# Patient Record
Sex: Female | Born: 1935
Health system: Southern US, Community
[De-identification: ages and names within clinical notes are randomized; demographics above are authoritative.]

## PROBLEM LIST (undated history)

## (undated) DIAGNOSIS — R0902 Hypoxemia: Secondary | ICD-10-CM

## (undated) DIAGNOSIS — M546 Pain in thoracic spine: Secondary | ICD-10-CM

## (undated) DIAGNOSIS — E739 Lactose intolerance, unspecified: Secondary | ICD-10-CM

## (undated) DIAGNOSIS — Z87898 Personal history of other specified conditions: Secondary | ICD-10-CM

## (undated) DIAGNOSIS — R300 Dysuria: Secondary | ICD-10-CM

## (undated) DIAGNOSIS — Z8619 Personal history of other infectious and parasitic diseases: Secondary | ICD-10-CM

## (undated) DIAGNOSIS — J189 Pneumonia, unspecified organism: Secondary | ICD-10-CM

## (undated) DIAGNOSIS — R062 Wheezing: Secondary | ICD-10-CM

## (undated) DIAGNOSIS — K219 Gastro-esophageal reflux disease without esophagitis: Secondary | ICD-10-CM

## (undated) DIAGNOSIS — R0789 Other chest pain: Secondary | ICD-10-CM

## (undated) DIAGNOSIS — R9389 Abnormal findings on diagnostic imaging of other specified body structures: Secondary | ICD-10-CM

## (undated) DIAGNOSIS — R5383 Other fatigue: Secondary | ICD-10-CM

## (undated) DIAGNOSIS — R0600 Dyspnea, unspecified: Secondary | ICD-10-CM

## (undated) DIAGNOSIS — J019 Acute sinusitis, unspecified: Secondary | ICD-10-CM

## (undated) DIAGNOSIS — K5730 Diverticulosis of large intestine without perforation or abscess without bleeding: Secondary | ICD-10-CM

## (undated) DIAGNOSIS — E669 Obesity, unspecified: Secondary | ICD-10-CM

## (undated) DIAGNOSIS — D649 Anemia, unspecified: Secondary | ICD-10-CM

## (undated) DIAGNOSIS — R1030 Lower abdominal pain, unspecified: Secondary | ICD-10-CM

## (undated) DIAGNOSIS — R0683 Snoring: Secondary | ICD-10-CM

## (undated) DIAGNOSIS — N390 Urinary tract infection, site not specified: Secondary | ICD-10-CM

## (undated) DIAGNOSIS — J069 Acute upper respiratory infection, unspecified: Secondary | ICD-10-CM

## (undated) DIAGNOSIS — K120 Recurrent oral aphthae: Secondary | ICD-10-CM

## (undated) DIAGNOSIS — E785 Hyperlipidemia, unspecified: Secondary | ICD-10-CM

## (undated) DIAGNOSIS — R21 Rash and other nonspecific skin eruption: Secondary | ICD-10-CM

## (undated) DIAGNOSIS — Z86718 Personal history of other venous thrombosis and embolism: Secondary | ICD-10-CM

## (undated) DIAGNOSIS — R7302 Impaired glucose tolerance (oral): Secondary | ICD-10-CM

## (undated) DIAGNOSIS — R918 Other nonspecific abnormal finding of lung field: Secondary | ICD-10-CM

## (undated) DIAGNOSIS — I82409 Acute embolism and thrombosis of unspecified deep veins of unspecified lower extremity: Secondary | ICD-10-CM

## (undated) DIAGNOSIS — R05 Cough: Secondary | ICD-10-CM

## (undated) DIAGNOSIS — R110 Nausea: Secondary | ICD-10-CM

## (undated) DIAGNOSIS — K589 Irritable bowel syndrome without diarrhea: Secondary | ICD-10-CM

## (undated) DIAGNOSIS — M810 Age-related osteoporosis without current pathological fracture: Secondary | ICD-10-CM

## (undated) DIAGNOSIS — J029 Acute pharyngitis, unspecified: Secondary | ICD-10-CM

## (undated) DIAGNOSIS — R197 Diarrhea, unspecified: Secondary | ICD-10-CM

## (undated) DIAGNOSIS — R002 Palpitations: Secondary | ICD-10-CM

## (undated) DIAGNOSIS — K222 Esophageal obstruction: Secondary | ICD-10-CM

## (undated) DIAGNOSIS — R42 Dizziness and giddiness: Secondary | ICD-10-CM

## (undated) DIAGNOSIS — N281 Cyst of kidney, acquired: Secondary | ICD-10-CM

## (undated) DIAGNOSIS — M79609 Pain in unspecified limb: Secondary | ICD-10-CM

## (undated) DIAGNOSIS — R609 Edema, unspecified: Secondary | ICD-10-CM

## (undated) DIAGNOSIS — I10 Essential (primary) hypertension: Secondary | ICD-10-CM

## (undated) DIAGNOSIS — R911 Solitary pulmonary nodule: Secondary | ICD-10-CM

## (undated) DIAGNOSIS — J209 Acute bronchitis, unspecified: Secondary | ICD-10-CM

## (undated) HISTORY — DX: Personal history of other infectious and parasitic diseases: Z86.19

## (undated) HISTORY — DX: Other chest pain: R07.89

## (undated) HISTORY — DX: Essential (primary) hypertension: I10

## (undated) HISTORY — DX: Acute pharyngitis, unspecified: J02.9

## (undated) HISTORY — DX: Edema, unspecified: R60.9

## (undated) HISTORY — DX: Obesity, unspecified: E66.9

## (undated) HISTORY — DX: Lower abdominal pain, unspecified: R10.30

## (undated) HISTORY — DX: Esophageal obstruction: K22.2

## (undated) HISTORY — DX: Pain in thoracic spine: M54.6

## (undated) HISTORY — DX: Dyspnea, unspecified: R06.00

## (undated) HISTORY — DX: Urinary tract infection, site not specified: N39.0

## (undated) HISTORY — DX: Cyst of kidney, acquired: N28.1

## (undated) HISTORY — DX: Rash and other nonspecific skin eruption: R21

## (undated) HISTORY — DX: Irritable bowel syndrome without diarrhea: K58.9

## (undated) HISTORY — DX: Acute sinusitis, unspecified: J01.90

## (undated) HISTORY — DX: Nausea: R11.0

## (undated) HISTORY — DX: Other fatigue: R53.83

## (undated) HISTORY — DX: Anemia, unspecified: D64.9

## (undated) HISTORY — DX: Lactose intolerance, unspecified: E73.9

## (undated) HISTORY — PX: APPENDECTOMY: SHX54

## (undated) HISTORY — DX: Acute embolism and thrombosis of unspecified deep veins of unspecified lower extremity: I82.409

## (undated) HISTORY — DX: Personal history of other venous thrombosis and embolism: Z86.718

## (undated) HISTORY — PX: CHOLECYSTECTOMY: SHX55

## (undated) HISTORY — DX: Dysuria: R30.0

## (undated) HISTORY — DX: Pneumonia, unspecified organism: J18.9

## (undated) HISTORY — DX: Dizziness and giddiness: R42

## (undated) HISTORY — DX: Cough: R05

## (undated) HISTORY — DX: Age-related osteoporosis without current pathological fracture: M81.0

## (undated) HISTORY — DX: Solitary pulmonary nodule: R91.1

## (undated) HISTORY — DX: Acute bronchitis, unspecified: J20.9

## (undated) HISTORY — DX: Hyperlipidemia, unspecified: E78.5

## (undated) HISTORY — DX: Impaired glucose tolerance (oral): R73.02

## (undated) HISTORY — DX: Acute upper respiratory infection, unspecified: J06.9

## (undated) HISTORY — DX: Pain in unspecified limb: M79.609

## (undated) HISTORY — DX: Personal history of other specified conditions: Z87.898

## (undated) HISTORY — DX: Palpitations: R00.2

## (undated) HISTORY — DX: Snoring: R06.83

## (undated) HISTORY — DX: Diverticulosis of large intestine without perforation or abscess without bleeding: K57.30

## (undated) HISTORY — PX: TUBAL LIGATION: SHX77

## (undated) HISTORY — DX: Abnormal findings on diagnostic imaging of other specified body structures: R93.89

## (undated) HISTORY — DX: Hypoxemia: R09.02

## (undated) HISTORY — DX: Other nonspecific abnormal finding of lung field: R91.8

## (undated) HISTORY — DX: Wheezing: R06.2

## (undated) HISTORY — DX: Gastro-esophageal reflux disease without esophagitis: K21.9

## (undated) HISTORY — DX: Recurrent oral aphthae: K12.0

## (undated) HISTORY — DX: Diarrhea, unspecified: R19.7

---

## 1996-04-08 DIAGNOSIS — I82409 Acute embolism and thrombosis of unspecified deep veins of unspecified lower extremity: Secondary | ICD-10-CM

## 1996-04-08 HISTORY — DX: Acute embolism and thrombosis of unspecified deep veins of unspecified lower extremity: I82.409

## 1997-08-17 ENCOUNTER — Other Ambulatory Visit: Admission: RE | Admit: 1997-08-17 | Discharge: 1997-08-17 | Payer: Self-pay | Admitting: Obstetrics and Gynecology

## 1997-10-06 HISTORY — PX: HYSTEROSCOPY WITH RESECTOSCOPE: SHX5395

## 1997-10-11 ENCOUNTER — Ambulatory Visit (HOSPITAL_COMMUNITY): Admission: RE | Admit: 1997-10-11 | Discharge: 1997-10-11 | Payer: Self-pay | Admitting: Obstetrics and Gynecology

## 1998-03-09 ENCOUNTER — Other Ambulatory Visit: Admission: RE | Admit: 1998-03-09 | Discharge: 1998-03-09 | Payer: Self-pay | Admitting: Obstetrics and Gynecology

## 1998-07-06 ENCOUNTER — Ambulatory Visit (HOSPITAL_COMMUNITY): Admission: RE | Admit: 1998-07-06 | Discharge: 1998-07-06 | Payer: Self-pay | Admitting: Internal Medicine

## 1998-07-06 ENCOUNTER — Encounter: Payer: Self-pay | Admitting: Internal Medicine

## 1998-09-18 ENCOUNTER — Encounter: Payer: Self-pay | Admitting: Internal Medicine

## 1998-09-18 ENCOUNTER — Ambulatory Visit (HOSPITAL_COMMUNITY): Admission: RE | Admit: 1998-09-18 | Discharge: 1998-09-18 | Payer: Self-pay | Admitting: Internal Medicine

## 1999-01-23 ENCOUNTER — Other Ambulatory Visit: Admission: RE | Admit: 1999-01-23 | Discharge: 1999-01-23 | Payer: Self-pay | Admitting: Obstetrics and Gynecology

## 2000-02-12 ENCOUNTER — Other Ambulatory Visit: Admission: RE | Admit: 2000-02-12 | Discharge: 2000-02-12 | Payer: Self-pay | Admitting: Obstetrics and Gynecology

## 2001-03-11 ENCOUNTER — Other Ambulatory Visit: Admission: RE | Admit: 2001-03-11 | Discharge: 2001-03-11 | Payer: Self-pay | Admitting: Obstetrics and Gynecology

## 2002-03-12 ENCOUNTER — Other Ambulatory Visit: Admission: RE | Admit: 2002-03-12 | Discharge: 2002-03-12 | Payer: Self-pay | Admitting: Obstetrics and Gynecology

## 2002-07-13 ENCOUNTER — Other Ambulatory Visit: Admission: RE | Admit: 2002-07-13 | Discharge: 2002-07-13 | Payer: Self-pay | Admitting: Obstetrics and Gynecology

## 2002-07-21 ENCOUNTER — Ambulatory Visit (HOSPITAL_COMMUNITY): Admission: RE | Admit: 2002-07-21 | Discharge: 2002-07-21 | Payer: Self-pay | Admitting: Internal Medicine

## 2002-07-21 ENCOUNTER — Encounter: Payer: Self-pay | Admitting: Internal Medicine

## 2003-03-30 ENCOUNTER — Other Ambulatory Visit: Admission: RE | Admit: 2003-03-30 | Discharge: 2003-03-30 | Payer: Self-pay | Admitting: Obstetrics and Gynecology

## 2004-03-14 ENCOUNTER — Ambulatory Visit: Payer: Self-pay | Admitting: Internal Medicine

## 2004-04-03 ENCOUNTER — Other Ambulatory Visit: Admission: RE | Admit: 2004-04-03 | Discharge: 2004-04-03 | Payer: Self-pay | Admitting: Obstetrics and Gynecology

## 2004-06-04 ENCOUNTER — Ambulatory Visit: Payer: Self-pay | Admitting: Internal Medicine

## 2004-06-08 ENCOUNTER — Ambulatory Visit: Payer: Self-pay

## 2004-06-25 ENCOUNTER — Ambulatory Visit: Payer: Self-pay | Admitting: Internal Medicine

## 2004-07-12 ENCOUNTER — Ambulatory Visit: Payer: Self-pay | Admitting: Internal Medicine

## 2004-08-29 ENCOUNTER — Ambulatory Visit: Payer: Self-pay | Admitting: Internal Medicine

## 2005-02-18 ENCOUNTER — Ambulatory Visit: Payer: Self-pay | Admitting: Internal Medicine

## 2005-03-22 ENCOUNTER — Ambulatory Visit: Payer: Self-pay | Admitting: Internal Medicine

## 2005-04-05 ENCOUNTER — Ambulatory Visit: Payer: Self-pay | Admitting: Internal Medicine

## 2005-05-01 ENCOUNTER — Other Ambulatory Visit: Admission: RE | Admit: 2005-05-01 | Discharge: 2005-05-01 | Payer: Self-pay | Admitting: Obstetrics and Gynecology

## 2005-06-19 ENCOUNTER — Ambulatory Visit: Payer: Self-pay | Admitting: Internal Medicine

## 2005-10-11 ENCOUNTER — Encounter: Admission: RE | Admit: 2005-10-11 | Discharge: 2005-10-11 | Payer: Self-pay | Admitting: Sports Medicine

## 2005-12-20 ENCOUNTER — Ambulatory Visit: Payer: Self-pay | Admitting: Internal Medicine

## 2006-03-03 ENCOUNTER — Ambulatory Visit: Payer: Self-pay | Admitting: Internal Medicine

## 2006-03-03 LAB — CONVERTED CEMR LAB
ALT: 25 units/L (ref 0–40)
Chol/HDL Ratio, serum: 4.4
Cholesterol: 174 mg/dL (ref 0–200)
Crystals: NEGATIVE
Nitrite: NEGATIVE
Specific Gravity, Urine: 1.02 (ref 1.000–1.03)
Triglyceride fasting, serum: 145 mg/dL (ref 0–149)

## 2006-06-06 ENCOUNTER — Other Ambulatory Visit: Admission: RE | Admit: 2006-06-06 | Discharge: 2006-06-06 | Payer: Self-pay | Admitting: Obstetrics and Gynecology

## 2006-07-21 ENCOUNTER — Ambulatory Visit: Payer: Self-pay | Admitting: Internal Medicine

## 2006-07-21 LAB — CONVERTED CEMR LAB
AST: 26 units/L (ref 0–37)
Bilirubin, Direct: 0.1 mg/dL (ref 0.0–0.3)
CO2: 31 meq/L (ref 19–32)
Chloride: 107 meq/L (ref 96–112)
Cholesterol: 141 mg/dL (ref 0–200)
Creatinine, Ser: 0.8 mg/dL (ref 0.4–1.2)
Glucose, Bld: 108 mg/dL — ABNORMAL HIGH (ref 70–99)
HDL: 44.2 mg/dL (ref >39.0)
Potassium: 4.1 meq/L (ref 3.5–5.1)
Sodium: 145 meq/L (ref 135–145)
TSH: 0.51 microintl units/mL (ref 0.35–5.50)
Total Bilirubin: 0.8 mg/dL (ref 0.3–1.2)
Total Protein: 7.1 g/dL (ref 6.0–8.3)
Triglycerides: 85 mg/dL (ref 0–149)

## 2006-12-03 ENCOUNTER — Ambulatory Visit: Payer: Self-pay | Admitting: Internal Medicine

## 2006-12-03 LAB — CONVERTED CEMR LAB
ALT: 20 units/L (ref 0–35)
AST: 22 units/L (ref 0–37)
BUN: 17 mg/dL (ref 6–23)
Bacteria, UA: NEGATIVE
Creatinine, Ser: 0.6 mg/dL (ref 0.4–1.2)
Crystals: NEGATIVE
GFR calc Af Amer: 126 mL/min
Potassium: 4.6 meq/L (ref 3.5–5.1)
RBC / HPF: NONE SEEN
Specific Gravity, Urine: 1.015 (ref 1.000–1.03)
Total CHOL/HDL Ratio: 4.3
Urine Glucose: NEGATIVE mg/dL
Urobilinogen, UA: 0.2 (ref 0.0–1.0)

## 2007-02-02 ENCOUNTER — Ambulatory Visit: Payer: Self-pay | Admitting: Internal Medicine

## 2007-02-02 ENCOUNTER — Encounter: Payer: Self-pay | Admitting: Internal Medicine

## 2007-02-02 DIAGNOSIS — E785 Hyperlipidemia, unspecified: Secondary | ICD-10-CM

## 2007-02-02 DIAGNOSIS — K219 Gastro-esophageal reflux disease without esophagitis: Secondary | ICD-10-CM

## 2007-02-02 DIAGNOSIS — M810 Age-related osteoporosis without current pathological fracture: Secondary | ICD-10-CM

## 2007-02-02 DIAGNOSIS — Z86718 Personal history of other venous thrombosis and embolism: Secondary | ICD-10-CM | POA: Insufficient documentation

## 2007-02-02 HISTORY — DX: Gastro-esophageal reflux disease without esophagitis: K21.9

## 2007-02-02 HISTORY — DX: Personal history of other venous thrombosis and embolism: Z86.718

## 2007-02-02 HISTORY — DX: Age-related osteoporosis without current pathological fracture: M81.0

## 2007-02-02 HISTORY — DX: Hyperlipidemia, unspecified: E78.5

## 2007-02-28 ENCOUNTER — Ambulatory Visit: Payer: Self-pay | Admitting: Internal Medicine

## 2007-02-28 DIAGNOSIS — J029 Acute pharyngitis, unspecified: Secondary | ICD-10-CM | POA: Insufficient documentation

## 2007-02-28 HISTORY — DX: Acute pharyngitis, unspecified: J02.9

## 2007-03-06 ENCOUNTER — Ambulatory Visit: Payer: Self-pay | Admitting: Internal Medicine

## 2007-03-06 DIAGNOSIS — E739 Lactose intolerance, unspecified: Secondary | ICD-10-CM | POA: Insufficient documentation

## 2007-03-06 DIAGNOSIS — K222 Esophageal obstruction: Secondary | ICD-10-CM

## 2007-03-06 DIAGNOSIS — Z87898 Personal history of other specified conditions: Secondary | ICD-10-CM | POA: Insufficient documentation

## 2007-03-06 DIAGNOSIS — J019 Acute sinusitis, unspecified: Secondary | ICD-10-CM | POA: Insufficient documentation

## 2007-03-06 DIAGNOSIS — K589 Irritable bowel syndrome without diarrhea: Secondary | ICD-10-CM | POA: Insufficient documentation

## 2007-03-06 HISTORY — DX: Personal history of other specified conditions: Z87.898

## 2007-03-06 HISTORY — DX: Esophageal obstruction: K22.2

## 2007-03-06 HISTORY — DX: Lactose intolerance, unspecified: E73.9

## 2007-03-06 HISTORY — DX: Irritable bowel syndrome, unspecified: K58.9

## 2007-03-06 HISTORY — DX: Acute sinusitis, unspecified: J01.90

## 2007-03-08 DIAGNOSIS — I10 Essential (primary) hypertension: Secondary | ICD-10-CM | POA: Insufficient documentation

## 2007-03-08 DIAGNOSIS — K5730 Diverticulosis of large intestine without perforation or abscess without bleeding: Secondary | ICD-10-CM

## 2007-03-08 HISTORY — DX: Essential (primary) hypertension: I10

## 2007-03-08 HISTORY — DX: Diverticulosis of large intestine without perforation or abscess without bleeding: K57.30

## 2007-03-18 ENCOUNTER — Telehealth: Payer: Self-pay | Admitting: Internal Medicine

## 2007-03-20 ENCOUNTER — Ambulatory Visit: Payer: Self-pay | Admitting: Internal Medicine

## 2007-03-20 LAB — CONVERTED CEMR LAB
Calcium: 9 mg/dL (ref 8.4–10.5)
GFR calc Af Amer: 105 mL/min
GFR calc non Af Amer: 87 mL/min
Glucose, Bld: 101 mg/dL — ABNORMAL HIGH (ref 70–99)
Sodium: 141 meq/L (ref 135–145)

## 2007-03-30 ENCOUNTER — Telehealth: Payer: Self-pay | Admitting: Internal Medicine

## 2007-04-20 ENCOUNTER — Ambulatory Visit: Payer: Self-pay | Admitting: Internal Medicine

## 2007-04-20 DIAGNOSIS — R05 Cough: Secondary | ICD-10-CM

## 2007-04-20 DIAGNOSIS — R051 Acute cough: Secondary | ICD-10-CM | POA: Insufficient documentation

## 2007-04-20 DIAGNOSIS — R059 Cough, unspecified: Secondary | ICD-10-CM | POA: Insufficient documentation

## 2007-04-20 HISTORY — DX: Cough, unspecified: R05.9

## 2007-06-12 ENCOUNTER — Ambulatory Visit: Payer: Self-pay | Admitting: Internal Medicine

## 2007-06-12 DIAGNOSIS — J209 Acute bronchitis, unspecified: Secondary | ICD-10-CM | POA: Insufficient documentation

## 2007-06-12 DIAGNOSIS — J40 Bronchitis, not specified as acute or chronic: Secondary | ICD-10-CM | POA: Insufficient documentation

## 2007-06-12 HISTORY — DX: Acute bronchitis, unspecified: J20.9

## 2007-06-15 ENCOUNTER — Other Ambulatory Visit: Admission: RE | Admit: 2007-06-15 | Discharge: 2007-06-15 | Payer: Self-pay | Admitting: Obstetrics and Gynecology

## 2008-07-01 ENCOUNTER — Ambulatory Visit: Payer: Self-pay | Admitting: Internal Medicine

## 2008-07-01 DIAGNOSIS — R0600 Dyspnea, unspecified: Secondary | ICD-10-CM | POA: Insufficient documentation

## 2008-07-01 DIAGNOSIS — R110 Nausea: Secondary | ICD-10-CM | POA: Insufficient documentation

## 2008-07-01 HISTORY — DX: Nausea: R11.0

## 2008-07-01 HISTORY — DX: Dyspnea, unspecified: R06.00

## 2008-07-04 ENCOUNTER — Ambulatory Visit: Payer: Self-pay | Admitting: Internal Medicine

## 2008-07-05 ENCOUNTER — Telehealth (INDEPENDENT_AMBULATORY_CARE_PROVIDER_SITE_OTHER): Payer: Self-pay | Admitting: *Deleted

## 2008-07-06 ENCOUNTER — Ambulatory Visit: Payer: Self-pay

## 2008-07-06 ENCOUNTER — Encounter: Payer: Self-pay | Admitting: Cardiology

## 2008-07-06 LAB — CONVERTED CEMR LAB
AST: 22 units/L (ref 0–37)
Albumin: 3.4 g/dL — ABNORMAL LOW (ref 3.5–5.2)
Alkaline Phosphatase: 70 units/L (ref 39–117)
Basophils Absolute: 0 10*3/uL (ref 0.0–0.1)
Bilirubin Urine: NEGATIVE
Calcium: 9.1 mg/dL (ref 8.4–10.5)
Cholesterol: 193 mg/dL (ref 0–200)
Creatinine, Ser: 0.8 mg/dL (ref 0.4–1.2)
Eosinophils Absolute: 0.2 10*3/uL (ref 0.0–0.7)
HCT: 37.5 % (ref 36.0–46.0)
Hemoglobin, Urine: NEGATIVE
Ketones, ur: NEGATIVE mg/dL
Lymphocytes Relative: 33 % (ref 12.0–46.0)
Lymphs Abs: 1.4 10*3/uL (ref 0.7–4.0)
Monocytes Relative: 9.5 % (ref 3.0–12.0)
Platelets: 170 10*3/uL (ref 150.0–400.0)
RDW: 14.5 % (ref 11.5–14.6)
TSH: 1.14 microintl units/mL (ref 0.35–5.50)
Total Protein, Urine: NEGATIVE mg/dL
Total Protein: 6.6 g/dL (ref 6.0–8.3)
Triglycerides: 123 mg/dL (ref 0.0–149.0)
Urobilinogen, UA: 0.2 (ref 0.0–1.0)

## 2008-07-13 ENCOUNTER — Telehealth: Payer: Self-pay | Admitting: Internal Medicine

## 2008-07-14 ENCOUNTER — Telehealth: Payer: Self-pay | Admitting: Internal Medicine

## 2008-07-19 ENCOUNTER — Ambulatory Visit: Payer: Self-pay | Admitting: Internal Medicine

## 2008-07-19 DIAGNOSIS — N390 Urinary tract infection, site not specified: Secondary | ICD-10-CM | POA: Insufficient documentation

## 2008-07-19 HISTORY — DX: Urinary tract infection, site not specified: N39.0

## 2008-07-26 ENCOUNTER — Other Ambulatory Visit: Admission: RE | Admit: 2008-07-26 | Discharge: 2008-07-26 | Payer: Self-pay | Admitting: Obstetrics & Gynecology

## 2009-01-13 ENCOUNTER — Ambulatory Visit: Payer: Self-pay | Admitting: Internal Medicine

## 2009-01-20 LAB — CONVERTED CEMR LAB
Albumin: 3.7 g/dL (ref 3.5–5.2)
CO2: 24 meq/L (ref 19–32)
Calcium: 9.2 mg/dL (ref 8.4–10.5)
Creatinine, Ser: 0.9 mg/dL (ref 0.4–1.2)
Glucose, Bld: 89 mg/dL (ref 70–99)
Hemoglobin, Urine: NEGATIVE
Nitrite: NEGATIVE
TSH: 1.21 microintl units/mL (ref 0.35–5.50)
Total Protein: 6.6 g/dL (ref 6.0–8.3)
Urobilinogen, UA: 0.2 (ref 0.0–1.0)

## 2009-01-26 ENCOUNTER — Ambulatory Visit: Payer: Self-pay | Admitting: Internal Medicine

## 2009-03-29 ENCOUNTER — Telehealth: Payer: Self-pay | Admitting: Internal Medicine

## 2009-06-23 ENCOUNTER — Encounter (INDEPENDENT_AMBULATORY_CARE_PROVIDER_SITE_OTHER): Payer: Self-pay | Admitting: *Deleted

## 2009-07-11 ENCOUNTER — Ambulatory Visit: Payer: Self-pay | Admitting: Internal Medicine

## 2009-07-11 LAB — CONVERTED CEMR LAB
Albumin: 3.7 g/dL (ref 3.5–5.2)
BUN: 14 mg/dL (ref 6–23)
Bilirubin Urine: NEGATIVE
Bilirubin, Direct: 0.1 mg/dL (ref 0.0–0.3)
Chloride: 103 meq/L (ref 96–112)
Cholesterol: 175 mg/dL (ref 0–200)
Hemoglobin, Urine: NEGATIVE
Hgb A1c MFr Bld: 5.9 % (ref 4.6–6.5)
LDL Cholesterol: 103 mg/dL — ABNORMAL HIGH (ref 0–99)
Nitrite: NEGATIVE
Potassium: 4.1 meq/L (ref 3.5–5.1)
Total Protein: 7.2 g/dL (ref 6.0–8.3)
VLDL: 22 mg/dL (ref 0.0–40.0)
pH: 6.5 (ref 5.0–8.0)

## 2009-07-12 ENCOUNTER — Telehealth: Payer: Self-pay | Admitting: Internal Medicine

## 2009-07-17 ENCOUNTER — Telehealth: Payer: Self-pay | Admitting: Internal Medicine

## 2009-07-25 ENCOUNTER — Ambulatory Visit: Payer: Self-pay | Admitting: Internal Medicine

## 2009-07-25 DIAGNOSIS — M79609 Pain in unspecified limb: Secondary | ICD-10-CM

## 2009-07-25 HISTORY — DX: Pain in unspecified limb: M79.609

## 2009-08-01 ENCOUNTER — Inpatient Hospital Stay (HOSPITAL_COMMUNITY): Admission: EM | Admit: 2009-08-01 | Discharge: 2009-08-04 | Payer: Self-pay | Admitting: Emergency Medicine

## 2009-08-01 ENCOUNTER — Ambulatory Visit: Payer: Self-pay | Admitting: Cardiovascular Disease

## 2009-08-01 ENCOUNTER — Ambulatory Visit: Payer: Self-pay | Admitting: Internal Medicine

## 2009-08-01 DIAGNOSIS — R0902 Hypoxemia: Secondary | ICD-10-CM | POA: Insufficient documentation

## 2009-08-01 HISTORY — DX: Hypoxemia: R09.02

## 2009-08-02 ENCOUNTER — Encounter (INDEPENDENT_AMBULATORY_CARE_PROVIDER_SITE_OTHER): Payer: Self-pay | Admitting: Internal Medicine

## 2009-08-07 ENCOUNTER — Telehealth: Payer: Self-pay | Admitting: Internal Medicine

## 2009-08-10 ENCOUNTER — Ambulatory Visit: Payer: Self-pay | Admitting: Internal Medicine

## 2009-08-10 DIAGNOSIS — R21 Rash and other nonspecific skin eruption: Secondary | ICD-10-CM | POA: Insufficient documentation

## 2009-08-10 DIAGNOSIS — D649 Anemia, unspecified: Secondary | ICD-10-CM | POA: Insufficient documentation

## 2009-08-10 HISTORY — DX: Rash and other nonspecific skin eruption: R21

## 2009-08-10 HISTORY — DX: Anemia, unspecified: D64.9

## 2009-08-15 DIAGNOSIS — J189 Pneumonia, unspecified organism: Secondary | ICD-10-CM | POA: Insufficient documentation

## 2009-08-15 HISTORY — DX: Pneumonia, unspecified organism: J18.9

## 2009-09-12 ENCOUNTER — Ambulatory Visit: Payer: Self-pay | Admitting: Internal Medicine

## 2009-09-12 LAB — CONVERTED CEMR LAB
BUN: 18 mg/dL (ref 6–23)
CO2: 30 meq/L (ref 19–32)
Calcium: 9.4 mg/dL (ref 8.4–10.5)
Creatinine, Ser: 0.8 mg/dL (ref 0.4–1.2)
Glucose, Bld: 101 mg/dL — ABNORMAL HIGH (ref 70–99)
Ketones, ur: NEGATIVE mg/dL
Specific Gravity, Urine: >=1.03 (ref 1.000–1.030)
Urine Glucose: NEGATIVE mg/dL
pH: 5.5 (ref 5.0–8.0)

## 2009-09-26 ENCOUNTER — Emergency Department (HOSPITAL_COMMUNITY): Admission: EM | Admit: 2009-09-26 | Discharge: 2009-09-26 | Payer: Self-pay | Admitting: Emergency Medicine

## 2009-09-27 ENCOUNTER — Ambulatory Visit: Payer: Self-pay | Admitting: Cardiovascular Disease

## 2009-09-27 DIAGNOSIS — R55 Syncope and collapse: Secondary | ICD-10-CM | POA: Insufficient documentation

## 2009-09-27 DIAGNOSIS — R609 Edema, unspecified: Secondary | ICD-10-CM | POA: Insufficient documentation

## 2009-09-27 HISTORY — DX: Edema, unspecified: R60.9

## 2009-09-29 ENCOUNTER — Ambulatory Visit: Payer: Self-pay | Admitting: Internal Medicine

## 2009-10-27 ENCOUNTER — Encounter: Payer: Self-pay | Admitting: Cardiovascular Disease

## 2009-10-27 ENCOUNTER — Ambulatory Visit (HOSPITAL_COMMUNITY): Admission: RE | Admit: 2009-10-27 | Discharge: 2009-10-27 | Payer: Self-pay | Admitting: Cardiovascular Disease

## 2009-10-27 ENCOUNTER — Ambulatory Visit: Payer: Self-pay | Admitting: Cardiology

## 2009-10-27 ENCOUNTER — Ambulatory Visit: Payer: Self-pay

## 2010-02-06 ENCOUNTER — Ambulatory Visit: Payer: Self-pay | Admitting: Internal Medicine

## 2010-02-06 LAB — CONVERTED CEMR LAB
CO2: 29 meq/L (ref 19–32)
Chloride: 108 meq/L (ref 96–112)
Creatinine, Ser: 0.7 mg/dL (ref 0.4–1.2)
Potassium: 4.3 meq/L (ref 3.5–5.1)
Sodium: 143 meq/L (ref 135–145)

## 2010-02-15 ENCOUNTER — Ambulatory Visit: Payer: Self-pay | Admitting: Internal Medicine

## 2010-02-26 ENCOUNTER — Telehealth: Payer: Self-pay | Admitting: Internal Medicine

## 2010-04-08 DIAGNOSIS — R918 Other nonspecific abnormal finding of lung field: Secondary | ICD-10-CM

## 2010-04-08 HISTORY — DX: Other nonspecific abnormal finding of lung field: R91.8

## 2010-04-10 ENCOUNTER — Ambulatory Visit: Admit: 2010-04-10 | Payer: Self-pay | Admitting: Internal Medicine

## 2010-05-04 ENCOUNTER — Telehealth: Payer: Self-pay | Admitting: Internal Medicine

## 2010-05-04 ENCOUNTER — Telehealth (INDEPENDENT_AMBULATORY_CARE_PROVIDER_SITE_OTHER): Payer: Self-pay | Admitting: *Deleted

## 2010-05-04 ENCOUNTER — Other Ambulatory Visit: Payer: Self-pay | Admitting: Internal Medicine

## 2010-05-04 ENCOUNTER — Ambulatory Visit
Admission: RE | Admit: 2010-05-04 | Discharge: 2010-05-04 | Payer: Self-pay | Source: Home / Self Care | Attending: Internal Medicine | Admitting: Internal Medicine

## 2010-05-04 LAB — URINALYSIS
Bilirubin Urine: NEGATIVE
Hemoglobin, Urine: NEGATIVE
Ketones, ur: NEGATIVE
Total Protein, Urine: NEGATIVE
Urine Glucose: NEGATIVE
Urobilinogen, UA: 0.2 (ref 0.0–1.0)

## 2010-05-04 LAB — URINALYSIS, ROUTINE W REFLEX MICROSCOPIC
Bilirubin Urine: NEGATIVE
Hemoglobin, Urine: NEGATIVE
Ketones, ur: NEGATIVE
Nitrite: NEGATIVE
Total Protein, Urine: NEGATIVE
Urine Glucose: NEGATIVE
pH: 5 (ref 5.0–8.0)

## 2010-05-08 NOTE — Assessment & Plan Note (Signed)
Summary: 4 mos f/u #/cd   Vital Signs:  Patient profile:   77 year old female Height:      59 inches Weight:      162 pounds BMI:     32.84 O2 Sat:      99 % on Room air Temp:     97.7 degrees F oral Pulse rate:   73 / minute BP sitting:   104 / 70  (left arm) Cuff size:   regular  Vitals Entered By: Alysia Penna (February 15, 2010 1:18 PM)  O2 Flow:  Room air CC: pt here for follow up. /cp sma   Primary Care Provider:  Tresa Garter MD  CC:  pt here for follow up. /cp sma.  History of Present Illness: The patient presents for a follow up of hypertension, syncope, hyperlipidemia   Current Medications (verified): 1)  Centrum Silver   Tabs (Multiple Vitamins-Minerals) .... Once Daily 2)  Adult Aspirin Low Strength 81 Mg  Tbdp (Aspirin) .... Once Daily 3)  Omega-3 1000 Mg  Caps (Omega-3 Fatty Acids) .... Once Daily 4)  Calcium-Vitamin D 500-200 Mg-Unit  Tabs (Calcium Carbonate-Vitamin D) .... Once Daily 5)  Lasix 40 Mg  Tabs (Furosemide) .Marland Kitchen.. 1 Po As Needed 6)  Klor-Con M20 20 Meq Cr-Tabs (Potassium Chloride Crys Cr) .Marland Kitchen.. 1 Once Daily 7)  Simvastatin 10 Mg Tabs (Simvastatin) .Marland Kitchen.. 1 Po Qd 8)  Benicar 40 Mg  Tabs (Olmesartan Medoxomil) .Marland Kitchen.. 1 By Mouth Once Daily 9)  Naproxen 500 Mg Tabs (Naproxen) .Marland Kitchen.. 1 By Mouth Two Times A Day Pc For Pain/arthritis  Allergies (verified): 1)  ! Penicillin 2)  ! Rocephin 3)  Lipitor (Atorvastatin Calcium) 4)  * Fosamax 5)  Avelox 6)  Codeine Phosphate (Codeine Phosphate)  Past History:  Past Medical History: Last updated: 09/27/2009 Hypertension Hyperlipidemia 272.0 DVT, hx of RIGHT  98 GERD Osteoporosis  733.00 ESOPHAGITIS  06 esophageal stricture PALPITATIONS 785.1 LEFT KIDNEY CYST  DR. Aldean Ast Diverticulosis, colon IBS Osteoporosis Hx of shingles glucose intolerance Pneumonia, hx of 2011  Social History: Last updated: 09/27/2009  She rarely drinks wine.  She has never used tobacco or   drugs.  She is  active.  Lives independently. Widowed     Review of Systems       lost wt on diet  Physical Exam  General:  alert, pleasant, looks less tired Nose:  External nasal examination shows no deformity or inflammation. Nasal mucosa are pink and moist without lesions or exudates. Mouth:  Less erythematous throat mucosa and intranasal erythema.  Neck:  No deformities, masses, or tenderness noted. Heart:  RRR Abdomen:  S/NT Msk:  No deformity or scoliosis noted of thoracic or lumbar spine.  Good ROM, balance Calves NT Extremities:  no edema lower extremities bilaterally Neurologic:  No cranial nerve deficits noted. Station and gait are normal. Plantar reflexes are down-going bilaterally. DTRs are symmetrical throughout. Sensory, motor and coordinative functions appear intact. Skin:  Intact without suspicious lesions or rashes Psych:  Cognition and judgment appear intact. Alert and cooperative with normal attention span and concentration. No apparent delusions, illusions, hallucinations   Impression & Recommendations:  Problem # 1:  HYPERTENSION (ICD-401.9) Assessment Improved  Her updated medication list for this problem includes:    Lasix 40 Mg Tabs (Furosemide) .Marland Kitchen... 1 po as needed    Benicar 40 Mg Tabs (Olmesartan medoxomil) .Marland Kitchen... 1 by mouth once daily  Problem # 2:  HYPERLIPIDEMIA (ICD-272.4) Assessment: Unchanged  Her updated medication  list for this problem includes:    Simvastatin 10 Mg Tabs (Simvastatin) .Marland Kitchen... 1 po qd  Problem # 3:  GERD (ICD-530.81) Assessment: Improved  Problem # 4:  EDEMA (ICD-782.3) Assessment: Improved  Her updated medication list for this problem includes:    Lasix 40 Mg Tabs (Furosemide) .Marland Kitchen... 1 po as needed  Problem # 5:  SYNCOPE (ICD-780.2) resolved Assessment: Improved The labs were reviewed with the patient.   Complete Medication List: 1)  Centrum Silver Tabs (Multiple vitamins-minerals) .... Once daily 2)  Adult Aspirin Low Strength 81  Mg Tbdp (Aspirin) .... Once daily 3)  Omega-3 1000 Mg Caps (Omega-3 fatty acids) .... Once daily 4)  Calcium-vitamin D 500-200 Mg-unit Tabs (Calcium carbonate-vitamin d) .... Once daily 5)  Lasix 40 Mg Tabs (Furosemide) .Marland Kitchen.. 1 po as needed 6)  Klor-con M20 20 Meq Cr-tabs (Potassium chloride crys cr) .Marland Kitchen.. 1 once daily 7)  Simvastatin 10 Mg Tabs (Simvastatin) .Marland Kitchen.. 1 po qd 8)  Benicar 40 Mg Tabs (Olmesartan medoxomil) .Marland Kitchen.. 1 by mouth once daily 9)  Naproxen 500 Mg Tabs (Naproxen) .Marland Kitchen.. 1 by mouth two times a day pc for pain/arthritis  Patient Instructions: 1)  Please schedule a follow-up appointment in 6 months well w/labs. Prescriptions: BENICAR 40 MG  TABS (OLMESARTAN MEDOXOMIL) 1 by mouth once daily  #90 Tablet x 3   Entered and Authorized by:   Tresa Garter MD   Signed by:   Tresa Garter MD on 02/15/2010   Method used:   Print then Give to Patient   RxID:   [id number] SIMVASTATIN 10 MG TABS (SIMVASTATIN) 1 po qd  #90 x 3   Entered and Authorized by:   Tresa Garter MD   Signed by:   Tresa Garter MD on 02/15/2010   Method used:   Print then Give to Patient   RxID:   [id number] NAPROXEN 500 MG TABS (NAPROXEN) 1 by mouth two times a day pc for pain/arthritis  #60 x 3   Entered and Authorized by:   Tresa Garter MD   Signed by:   Tresa Garter MD on 02/15/2010   Method used:   Electronically to        [facility] (retail)       [address] [place], [place]  [address]       Ph: [phone]       Fax: [phone]   RxID:   [id number]    Orders Added: 1)  Est. Patient Level IV [[address]]   Immunization History:  Influenza Immunization History:    Influenza:  historical (01/17/2010)   Immunization History:  Influenza Immunization History:    Influenza:  Historical (01/17/2010)

## 2010-05-08 NOTE — Progress Notes (Signed)
Summary: results  Phone Note Call from Patient Call back at Home Phone [phone]   Summary of Call: Patient called stating that she will be going out of town and needs to know if she indeed has a uti. Patient would like to fill prescription before she leaves. Please advise. Initial call taken by: Lucious Groves,  July 12, 2009 10:00 AM  Follow-up for Phone Call        UA was nl. Any symptoms? Follow-up by: Tresa Garter MD,  July 12, 2009 1:14 PM  Additional Follow-up for Phone Call Additional follow up Details #1::        Patient notified, and c/o frequency but denies burning/cramping. Pt states that she will wait for her appt on the 19th. Additional Follow-up by: Lucious Groves,  July 12, 2009 2:54 PM    Additional Follow-up for Phone Call Additional follow up Details #2::    I can give her Cipro Rx now to take w/her if symptoms get worse Follow-up by: Tresa Garter MD,  July 12, 2009 5:07 PM  Additional Follow-up for Phone Call Additional follow up Details #3:: Details for Additional Follow-up Action Taken: Patient declined prescription. Does not want to take unless tests confirms infection. Additional Follow-up by: Lucious Groves,  July 13, 2009 4:56 PM  Prescriptions: CIPRO 250 MG TABS (CIPROFLOXACIN HCL) 1 by mouth x 5 days  #10 x 0   Entered by:   Lucious Groves   Authorized by:   Tresa Garter MD   Signed by:   Lucious Groves on 07/13/2009   Method used:   Electronically to        [facility] (retail)       [address] [place], [place]  [address]       Ph: [phone]       Fax: [phone]   RxID:   [id number]

## 2010-05-08 NOTE — Assessment & Plan Note (Signed)
Summary: 4 MO ROV /NWS #   Vital Signs:  Patient profile:   77 year old female Height:      59 inches Weight:      168.25 pounds BMI:     34.11 O2 Sat:      95 % on Room air Temp:     97.6 degrees F oral Pulse rate:   86 / minute BP sitting:   118 / 80  (left arm) Cuff size:   regular  Vitals Entered By: Lucious Groves (July 25, 2009 9:31 AM)  O2 Flow:  Room air CC: 4 mo rtn ov./kb Is Patient Diabetic? No Pain Assessment Patient in pain? no        Primary Care Provider:  Georgina Quint Marcy Bogosian MD  CC:  4 mo rtn ov./kb.  History of Present Illness: F/u HTN, elev. glu C/o cough x 1 wk C/o new foot pain  Current Medications (verified): 1)  Centrum Silver   Tabs (Multiple Vitamins-Minerals) .... Once Daily 2)  Adult Aspirin Low Strength 81 Mg  Tbdp (Aspirin) .... Once Daily 3)  Omega-3 1000 Mg  Caps (Omega-3 Fatty Acids) .... Once Daily 4)  Calcium-Vitamin D 500-200 Mg-Unit  Tabs (Calcium Carbonate-Vitamin D) .... Once Daily 5)  Lasix 40 Mg  Tabs (Furosemide) .Marland Kitchen.. 1 Po As Needed 6)  Klor-Con M20 20 Meq Cr-Tabs (Potassium Chloride Crys Cr) .Marland Kitchen.. 1 Once Daily 7)  Simvastatin 10 Mg Tabs (Simvastatin) .Marland Kitchen.. 1 Po Qd 8)  Benicar 40 Mg  Tabs (Olmesartan Medoxomil) .Marland Kitchen.. 1 By Mouth Once Daily  Allergies (verified): 1)  ! Penicillin 2)  Lipitor (Atorvastatin Calcium) 3)  Codeine Phosphate (Codeine Phosphate) 4)  * Fosamax  Past History:  Past Medical History: Last updated: 03/06/2007 DVT, hx of RIGHT  98 GERD Hyperlipidemia 272.0 Osteoporosis  733.00 ESOPHAGITIS  06 esophageal stricture PALPITATIONS 785.1 LEFT KIDNEY CYST  DR. Aldean Ast Hypertension Diverticulosis, colon IBS Osteoporosis Hx of shingles glucose intolerance  Social History: Last updated: 03/06/2007 Never Smoked Alcohol use-yes  Review of Systems       The patient complains of weight gain and prolonged cough.  The patient denies fever, chest pain, dyspnea on exertion, and abdominal pain.     Physical Exam  General:  alert, pleasant, healthy appering adult Nose:  External nasal examination shows no deformity or inflammation. Nasal mucosa are pink and moist without lesions or exudates. Mouth:  Oral mucosa and oropharynx without lesions or exudates.  Teeth in good repair. Lungs:  clear bilaterally, no wheezes, ronchi or crackles Heart:  RRR, mild systolic murmur best heard at the pulmonic post, no rubs, gallops or thrills Abdomen:  soft and non-tender with normal BS, no organomagalies, no masses Msk:  No deformity or scoliosis noted of thoracic or lumbar spine.  Great ROM, balance R forefoot is tender Neurologic:  No cranial nerve deficits noted. Station and gait are normal. Plantar reflexes are down-going bilaterally. DTRs are symmetrical throughout. Sensory, motor and coordinative functions appear intact. Skin:  Intact without suspicious lesions or rashes Psych:  Cognition and judgment appear intact. Alert and cooperative with normal attention span and concentration. No apparent delusions, illusions, hallucinations   Impression & Recommendations:  Problem # 1:  COUGH (ICD-786.2) asthmatic Assessment New Loratidine Proair  Problem # 2:  FOOT PAIN (ICD-729.5) R Assessment: New Had x rays Dr Farris Has Pennsaid  Problem # 3:  GLUCOSE INTOLERANCE (ICD-271.3) Assessment: Unchanged The labs were reviewed with the patient.   Problem # 4:  UTI (ICD-599.0) Assessment:  New Cipro prn  Problem # 5:  BRONCHITIS, ACUTE (ICD-466.0)  Her updated medication list for this problem includes:    Proair Hfa 108 (90 Base) Mcg/act Aers (Albuterol sulfate) .Marland Kitchen... 2 inh qid as needed    Zithromax Z-pak 250 Mg Tabs (Azithromycin) .Marland Kitchen... As dirrected if green d/c  Problem # 6:  HYPERTENSION (ICD-401.9) Assessment: Comment Only  Her updated medication list for this problem includes:    Lasix 40 Mg Tabs (Furosemide) .Marland Kitchen... 1 po as needed    Benicar 40 Mg Tabs (Olmesartan medoxomil) .Marland Kitchen... 1  by mouth once daily  Complete Medication List: 1)  Centrum Silver Tabs (Multiple vitamins-minerals) .... Once daily 2)  Adult Aspirin Low Strength 81 Mg Tbdp (Aspirin) .... Once daily 3)  Omega-3 1000 Mg Caps (Omega-3 fatty acids) .... Once daily 4)  Calcium-vitamin D 500-200 Mg-unit Tabs (Calcium carbonate-vitamin d) .... Once daily 5)  Lasix 40 Mg Tabs (Furosemide) .Marland Kitchen.. 1 po as needed 6)  Klor-con M20 20 Meq Cr-tabs (Potassium chloride crys cr) .Marland Kitchen.. 1 once daily 7)  Simvastatin 10 Mg Tabs (Simvastatin) .Marland Kitchen.. 1 po qd 8)  Benicar 40 Mg Tabs (Olmesartan medoxomil) .Marland Kitchen.. 1 by mouth once daily 9)  Loratadine 10 Mg Tabs (Loratadine) .Marland Kitchen.. 1 by mouth once daily as needed allergies 10)  Proair Hfa 108 (90 Base) Mcg/act Aers (Albuterol sulfate) .... 2 inh qid as needed 11)  Zithromax Z-pak 250 Mg Tabs (Azithromycin) .... As dirrected 12)  Pennsaid 1.5 % Soln (Diclofenac sodium) .... 3-5 gtt on skin three times a day for pain  Patient Instructions: 1)  Please schedule a follow-up appointment in 4 months. 2)  BMP prior to visit, ICD-9: 3)  HbgA1C prior to visit, ICD-9:790.29 Prescriptions: PENNSAID 1.5 % SOLN (DICLOFENAC SODIUM) 3-5 gtt on skin three times a day for pain  #1 x 3   Entered and Authorized by:   Tresa Garter MD   Signed by:   Tresa Garter MD on 07/25/2009   Method used:   Print then Give to Patient   RxID:   [id number] SIMVASTATIN 10 MG TABS (SIMVASTATIN) 1 po qd  #90 x 3   Entered and Authorized by:   Tresa Garter MD   Signed by:   Tresa Garter MD on 07/25/2009   Method used:   Electronically to        [facility] (retail)       [address] [place], [place]  [address]       Ph: [phone]       Fax: [phone]   RxID:   [id number] BENICAR 40 MG  TABS (OLMESARTAN MEDOXOMIL) 1 by mouth once daily  #90 Tablet x 3   Entered and Authorized by:   Tresa Garter MD   Signed by:   Tresa Garter MD on 07/25/2009   Method used:   Electronically to        [facility] (retail)       [address] [place], [place]  [address]       Ph: [phone]       Fax: [phone]   RxID:   [id number] ZITHROMAX Z-PAK 250 MG TABS (AZITHROMYCIN) as dirrected  #1 x 0   Entered and Authorized by:   Georgina Quint Karlynn Furrow  MD   Signed by:   Tresa Garter MD on 07/25/2009   Method used:   Electronically to        [facility] (retail)       [address] [place], [place]  [address]       Ph: [phone]       Fax: [phone]   RxID:   [id number] PROAIR HFA 108 (90 BASE) MCG/ACT AERS (ALBUTEROL SULFATE) 2 inh qid as needed  #3 x 3   Entered and Authorized by:   Tresa Garter MD   Signed by:   Tresa Garter MD on 07/25/2009   Method used:   Electronically to        [facility] (retail)       [address] [place], [place]  [address]       Ph: [phone]       Fax: [phone]   RxID:   [id number] LORATADINE 10 MG TABS (LORATADINE) 1 by mouth once daily as needed allergies  #30 x 6   Entered and Authorized by:   Tresa Garter MD   Signed by:   Tresa Garter MD on 07/25/2009   Method used:   Electronically to        [facility] (retail)       [address] [place], [place]  [address]       Ph: [phone]       Fax: [phone]   RxID:   [id number]

## 2010-05-08 NOTE — Assessment & Plan Note (Signed)
Summary: 6 wk f/u // #/ cd   Vital Signs:  Patient profile:   77 year old female Weight:      170 pounds BMI:     34.46 O2 Sat:      96 % on Room air Temp:     97.9 degrees F oral Pulse rate:   98 / minute Resp:     16 per minute BP sitting:   100 / 60  (left arm)  Vitals Entered By: Lanier Prude, CMA(AAMA) (September 29, 2009 9:48 AM)  O2 Flow:  Room air CC: 6 wk f/u. Comments pt states she fainted Tues and is being followd by Dr. Eden Emms next month for an Echocardiogram.   Primary Care Provider:  Tresa Garter MD  CC:  6 wk f/u.Marland Kitchen  History of Present Illness: The patient presents for a post-ER visit for syncope. She went to see Dr Eden Emms F/u HTN, edema   Current Medications (verified): 1)  Centrum Silver   Tabs (Multiple Vitamins-Minerals) .... Once Daily 2)  Adult Aspirin Low Strength 81 Mg  Tbdp (Aspirin) .... Once Daily 3)  Omega-3 1000 Mg  Caps (Omega-3 Fatty Acids) .... Once Daily 4)  Calcium-Vitamin D 500-200 Mg-Unit  Tabs (Calcium Carbonate-Vitamin D) .... Once Daily 5)  Lasix 40 Mg  Tabs (Furosemide) .Marland Kitchen.. 1 Po As Needed 6)  Klor-Con M20 20 Meq Cr-Tabs (Potassium Chloride Crys Cr) .Marland Kitchen.. 1 Once Daily 7)  Simvastatin 10 Mg Tabs (Simvastatin) .Marland Kitchen.. 1 Po Qd 8)  Benicar 40 Mg  Tabs (Olmesartan Medoxomil) .Marland Kitchen.. 1 By Mouth Once Daily  Allergies (verified): 1)  ! Penicillin 2)  ! Rocephin 3)  Lipitor (Atorvastatin Calcium) 4)  * Fosamax 5)  Avelox 6)  Codeine Phosphate (Codeine Phosphate)  Review of Systems  The patient denies hoarseness, chest pain, syncope, dyspnea on exertion, peripheral edema, prolonged cough, headaches, abdominal pain, and melena.    Physical Exam  General:  alert, pleasant, looks less tired Nose:  External nasal examination shows no deformity or inflammation. Nasal mucosa are pink and moist without lesions or exudates. Mouth:  Less erythematous throat mucosa and intranasal erythema.  Chest Wall:  No deformities, masses, or tenderness  noted. Lungs:  B no ronchi Heart:  RRR Abdomen:  S/NT Msk:  No deformity or scoliosis noted of thoracic or lumbar spine.  Good ROM, balance Calves NT Neurologic:  No cranial nerve deficits noted. Station and gait are normal. Plantar reflexes are down-going bilaterally. DTRs are symmetrical throughout. Sensory, motor and coordinative functions appear intact. Skin:  Intact without suspicious lesions or rashes Psych:  Cognition and judgment appear intact. Alert and cooperative with normal attention span and concentration. No apparent delusions, illusions, hallucinations   Impression & Recommendations:  Problem # 1:  SYNCOPE (ICD-780.2) vaso vagal Assessment New She passed out during her sister's wound dressing change at  Ortho's Orders: Prescription Created Electronically [phone])  Problem # 2:  Abn CBC - resolved Assessment: Improved The labs were reviewed with the patient.   Problem # 3:  HYPERTENSION (ICD-401.9) Assessment: Improved  Her updated medication list for this problem includes:    Lasix 40 Mg Tabs (Furosemide) .Marland Kitchen... 1 po as needed    Benicar 40 Mg Tabs (Olmesartan medoxomil) .Marland Kitchen... 1 by mouth once daily  BP today: 100/60 Prior BP: 130/80 (09/27/2009)  Labs Reviewed: K+: 4.8 (09/12/2009) Creat: : 0.8 (09/12/2009)   Chol: 175 (07/11/2009)   HDL: 50.20 (07/11/2009)   LDL: 103 (07/11/2009)   TG: 110.0 (07/11/2009)  Problem # 4:  EDEMA (ICD-782.3) Assessment: Improved  Her updated medication list for this problem includes:    Lasix 40 Mg Tabs (Furosemide) .Marland Kitchen... 1 po as needed  Complete Medication List: 1)  Centrum Silver Tabs (Multiple vitamins-minerals) .... Once daily 2)  Adult Aspirin Low Strength 81 Mg Tbdp (Aspirin) .... Once daily 3)  Omega-3 1000 Mg Caps (Omega-3 fatty acids) .... Once daily 4)  Calcium-vitamin D 500-200 Mg-unit Tabs (Calcium carbonate-vitamin d) .... Once daily 5)  Lasix 40 Mg Tabs (Furosemide) .Marland Kitchen.. 1 po as needed 6)  Klor-con M20 20 Meq  Cr-tabs (Potassium chloride crys cr) .Marland Kitchen.. 1 once daily 7)  Simvastatin 10 Mg Tabs (Simvastatin) .Marland Kitchen.. 1 po qd 8)  Benicar 40 Mg Tabs (Olmesartan medoxomil) .Marland Kitchen.. 1 by mouth once daily 9)  Naproxen 500 Mg Tabs (Naproxen) .Marland Kitchen.. 1 by mouth two times a day pc for pain/arthritis  Patient Instructions: 1)  Please schedule a follow-up appointment in 4 months. 2)  BMP prior to visit, ICD-9:401.1 Prescriptions: LASIX 40 MG  TABS (FUROSEMIDE) 1 po as needed  #30 x 6   Entered and Authorized by:   Tresa Garter MD   Signed by:   Tresa Garter MD on 10/03/2009   Method used:   Electronically to        [facility] (retail)       [address] [place], [place]  [address]       Ph: [phone]       Fax: [phone]   RxID:   [id number] NAPROXEN 500 MG TABS (NAPROXEN) 1 by mouth two times a day pc for pain/arthritis  #60 x 3   Entered and Authorized by:   Tresa Garter MD   Signed by:   Tresa Garter MD on 09/29/2009   Method used:   Electronically to        [facility] (retail)       [address] [place], [place]  [address]       Ph: [phone]       Fax: [phone]   RxID:   [id number]

## 2010-05-08 NOTE — Progress Notes (Signed)
Summary: Cipro clarification  Phone Note From Pharmacy   Summary of Call: corrected cipro prescription. Initial call taken by: Lucious Groves,  July 17, 2009 9:28 AM    New/Updated Medications: CIPRO 250 MG TABS (CIPROFLOXACIN HCL) 1 by mouth bid  x 5 days Prescriptions: CIPRO 250 MG TABS (CIPROFLOXACIN HCL) 1 by mouth bid  x 5 days  #10 x 0   Entered by:   Lucious Groves   Authorized by:   Tresa Garter MD   Signed by:   Lucious Groves on 07/17/2009   Method used:   Electronically to        [facility] (retail)       [address] [place], [place]  [address]       Ph: [phone]       Fax: [phone]   RxID:   [id number]

## 2010-05-08 NOTE — Progress Notes (Signed)
Summary: Med reaction  Phone Note Call from Patient   Summary of Call: Pt c/o reaction from Avelox. She was d/c'd from hospital recently. Has taken 2 doses of avelox 400mg  since being home. C/o h/a, dizzyness, trouble sleeping, tingling in hands/feet. Has not taken med today. Please advise. D/c summary is avail in EMR.  Initial call taken by: Lamar Sprinkles, CMA,  Aug 07, 2009 9:16 AM  Follow-up for Phone Call        Stop Avelox Start Zpac Follow-up by: Tresa Garter MD,  Aug 07, 2009 1:06 PM  Additional Follow-up for Phone Call Additional follow up Details #1::        Spoke w/Islay Polanco. Pt has had total of 5 days of avelox and was on zpak in hospital but stopped b/c of reaction. OK to stop antibiotic and f/u soon for hospital f/u. Pt informed and scheduled for f/u this week Additional Follow-up by: Lamar Sprinkles, CMA,  Aug 07, 2009 1:26 PM   New Allergies: AVELOX Additional Follow-up for Phone Call Additional follow up Details #2::    Agree. Thx Follow-up by: Tresa Garter MD,  Aug 08, 2009 8:13 AM  New Allergies: AVELOX

## 2010-05-08 NOTE — Assessment & Plan Note (Signed)
Summary: [facility]   Vital Signs:  Patient profile:   77 year old female Height:      59 inches Weight:      166.25 pounds BMI:     33.70 O2 Sat:      95 % on Room air Temp:     97.4 degrees F oral Pulse rate:   91 / minute BP sitting:   140 / 88  (left arm) Cuff size:   regular  Vitals Entered By: Lucious Groves (Aug 10, 2009 9:47 AM)  O2 Flow:  Room air CC: Hosp f/u./kb Is Patient Diabetic? No Pain Assessment Patient in pain? no      Comments Patient is unsure of PCN allergy./kb   Primary Care Provider:  Tresa Garter MD  CC:  Hosp f/u./kb.  History of Present Illness: The patient presents for a post-hospital visit for pneumonia. C/o fatigue, cough, SOB - getting better.   Current Medications (verified): 1)  Centrum Silver   Tabs (Multiple Vitamins-Minerals) .... Once Daily 2)  Adult Aspirin Low Strength 81 Mg  Tbdp (Aspirin) .... Once Daily 3)  Omega-3 1000 Mg  Caps (Omega-3 Fatty Acids) .... Once Daily 4)  Calcium-Vitamin D 500-200 Mg-Unit  Tabs (Calcium Carbonate-Vitamin D) .... Once Daily 5)  Lasix 40 Mg  Tabs (Furosemide) .Marland Kitchen.. 1 Po As Needed 6)  Klor-Con M20 20 Meq Cr-Tabs (Potassium Chloride Crys Cr) .Marland Kitchen.. 1 Once Daily 7)  Simvastatin 10 Mg Tabs (Simvastatin) .Marland Kitchen.. 1 Po Qd 8)  Benicar 40 Mg  Tabs (Olmesartan Medoxomil) .Marland Kitchen.. 1 By Mouth Once Daily 9)  Loratadine 10 Mg Tabs (Loratadine) .Marland Kitchen.. 1 By Mouth Once Daily As Needed Allergies 10)  Proair Hfa 108 (90 Base) Mcg/act Aers (Albuterol Sulfate) .... 2 Inh Qid As Needed  Allergies (verified): 1)  ! Penicillin 2)  ! Rocephin 3)  Lipitor (Atorvastatin Calcium) 4)  * Fosamax 5)  Avelox 6)  Codeine Phosphate (Codeine Phosphate)  Past History:  Social History: Last updated: 03/06/2007 Never Smoked Alcohol use-yes  Past Medical History: DVT, hx of RIGHT  98 GERD Hyperlipidemia 272.0 Osteoporosis  733.00 ESOPHAGITIS  06 esophageal stricture PALPITATIONS 785.1 LEFT KIDNEY CYST   DR. Aldean Ast Hypertension Diverticulosis, colon IBS Osteoporosis Hx of shingles glucose intolerance Pneumonia, hx of 2011  Review of Systems       The patient complains of dyspnea on exertion and prolonged cough.  The patient denies fever and chest pain.    Physical Exam  General:  alert, pleasant, looks less tired Nose:  External nasal examination shows no deformity or inflammation. Nasal mucosa are pink and moist without lesions or exudates. Mouth:  Less erythematous throat mucosa and intranasal erythema.  Lungs:  B no ronchi Heart:  RRR Abdomen:  S/NT Msk:  No deformity or scoliosis noted of thoracic or lumbar spine.  Good ROM, balance Calves NT Extremities:  no edema lower extremities bilaterally Neurologic:  No cranial nerve deficits noted. Station and gait are normal. Plantar reflexes are down-going bilaterally. DTRs are symmetrical throughout. Sensory, motor and coordinative functions appear intact. Skin:  Intact without suspicious lesions or rashes Psych:  Cognition and judgment appear intact. Alert and cooperative with normal attention span and concentration. No apparent delusions, illusions, hallucinations   Impression & Recommendations:  Problem # 1:  HYPOXEMIA (ICD-799.02) Assessment Improved D/c summary was reviewed w/pt  Problem # 2:  ANEMIA, NORMOCYTIC (ICD-285.9) Assessment: New Will watch See "Patient Instructions".   Problem # 3:  PNEUMONIA, HX  OF (ICD-V12.60) Assessment: Improved D/c antibiotic  Problem # 4:  SKIN RASH (ICD-782.1) due to Rocephin Assessment: Improved  Problem # 5:  DYSPNEA (ICD-786.09) Assessment: Improved  Her updated medication list for this problem includes:    Lasix 40 Mg Tabs (Furosemide) .Marland Kitchen... 1 po as needed    Proair Hfa 108 (90 Base) Mcg/act Aers (Albuterol sulfate) .Marland Kitchen... 2 inh qid as needed  Problem # 6:  FATIGUE (ICD-780.79) Assessment: Improved  Complete Medication List: 1)  Centrum Silver Tabs (Multiple  vitamins-minerals) .... Once daily 2)  Adult Aspirin Low Strength 81 Mg Tbdp (Aspirin) .... Once daily 3)  Omega-3 1000 Mg Caps (Omega-3 fatty acids) .... Once daily 4)  Calcium-vitamin D 500-200 Mg-unit Tabs (Calcium carbonate-vitamin d) .... Once daily 5)  Lasix 40 Mg Tabs (Furosemide) .Marland Kitchen.. 1 po as needed 6)  Klor-con M20 20 Meq Cr-tabs (Potassium chloride crys cr) .Marland Kitchen.. 1 once daily 7)  Simvastatin 10 Mg Tabs (Simvastatin) .Marland Kitchen.. 1 po qd 8)  Benicar 40 Mg Tabs (Olmesartan medoxomil) .Marland Kitchen.. 1 by mouth once daily 9)  Loratadine 10 Mg Tabs (Loratadine) .Marland Kitchen.. 1 by mouth once daily as needed allergies 10)  Proair Hfa 108 (90 Base) Mcg/act Aers (Albuterol sulfate) .... 2 inh qid as needed  Patient Instructions: 1)  RTC 6 wks 2)  BMP prior to visit, ICD-9: 3)  Urine-dip prior to visit, ICD-9: 4)  Iron, TIBC 5)  Pathol periph smear Dx anemia, low PLT and low WBC Prescriptions: LASIX 40 MG  TABS (FUROSEMIDE) 1 po as needed  #90 x 3   Entered and Authorized by:   Tresa Garter MD   Signed by:   Tresa Garter MD on 08/10/2009   Method used:   Print then Give to Patient   RxID:   [id number]

## 2010-05-08 NOTE — Assessment & Plan Note (Signed)
Summary: PERS COUGH/CD   Vital Signs:  Patient profile:   77 year old female Height:      59 inches Weight:      162.25 pounds BMI:     32.89 O2 Sat:      86 % on Room air Temp:     97.8 degrees F oral Pulse rate:   103 / minute BP sitting:   140 / 70  (left arm) Cuff size:   regular  Vitals Entered By: Lucious Groves (August 01, 2009 8:10 AM)  O2 Flow:  Room air CC: C/O copugh x 1 week with congestion, fever, increased sweating, and headache./kb Comments Patient denies mucous production./kb   Primary Care Provider:  Tresa Garter MD  CC:  C/O copugh x 1 week with congestion, fever, increased sweating, and and headache./kb.  History of Present Illness: CC: cough and SOB HPI:77 yo F cough and SOB x 2 wks. Took Z pac a week ago. Worse since last Tue. Bad dry cough, CP, weak, SOB. O2 sat 86% here today. No CP  Current Medications (verified): 1)  Centrum Silver   Tabs (Multiple Vitamins-Minerals) .... Once Daily 2)  Adult Aspirin Low Strength 81 Mg  Tbdp (Aspirin) .... Once Daily 3)  Omega-3 1000 Mg  Caps (Omega-3 Fatty Acids) .... Once Daily 4)  Calcium-Vitamin D 500-200 Mg-Unit  Tabs (Calcium Carbonate-Vitamin D) .... Once Daily 5)  Lasix 40 Mg  Tabs (Furosemide) .Marland Kitchen.. 1 Po As Needed 6)  Klor-Con M20 20 Meq Cr-Tabs (Potassium Chloride Crys Cr) .Marland Kitchen.. 1 Once Daily 7)  Simvastatin 10 Mg Tabs (Simvastatin) .Marland Kitchen.. 1 Po Qd 8)  Benicar 40 Mg  Tabs (Olmesartan Medoxomil) .Marland Kitchen.. 1 By Mouth Once Daily 9)  Loratadine 10 Mg Tabs (Loratadine) .Marland Kitchen.. 1 By Mouth Once Daily As Needed Allergies 10)  Proair Hfa 108 (90 Base) Mcg/act Aers (Albuterol Sulfate) .... 2 Inh Qid As Needed 11)  Pennsaid 1.5 % Soln (Diclofenac Sodium) .... 3-5 Gtt On Skin Three Times A Day For Pain  Allergies (verified): 1)  ! Penicillin 2)  Lipitor (Atorvastatin Calcium) 3)  Codeine Phosphate (Codeine Phosphate) 4)  * Fosamax  Past History:  Past Medical History: Last updated: 03-13-2007 DVT, hx of RIGHT   98 GERD Hyperlipidemia 272.0 Osteoporosis  733.00 ESOPHAGITIS  06 esophageal stricture PALPITATIONS 785.1 LEFT KIDNEY CYST  DR. Aldean Ast Hypertension Diverticulosis, colon IBS Osteoporosis Hx of shingles glucose intolerance  Past Surgical History: Last updated: 2007-03-13 Cholecystectomy Tubal ligation Appendectomy  Family History: Last updated: 2007/03/13 father died with liver cancer sister with breast cancer father, 2 bor and sister with DM mother, brother and sister x 2 with asthma Family History of CAD Female 1st degree relative  Social History: Last updated: March 13, 2007 Never Smoked Alcohol use-yes  Review of Systems       The patient complains of fever, hoarseness, chest pain, dyspnea on exertion, prolonged cough, and muscle weakness.  The patient denies anorexia, weight loss, weight gain, vision loss, decreased hearing, syncope, peripheral edema, headaches, hemoptysis, abdominal pain, melena, hematochezia, severe indigestion/heartburn, hematuria, incontinence, genital sores, suspicious skin lesions, transient blindness, difficulty walking, depression, unusual weight change, abnormal bleeding, enlarged lymph nodes, angioedema, and breast masses.    Physical Exam  General:  alert, pleasant, looks  tired Head:  Normocephalic and atraumatic without obvious abnormalities. No apparent alopecia or balding. Eyes:  WNL Ears:  WNL Mouth:  Erythematous throat mucosa and intranasal erythema.  Neck:  No deformities, masses, or tenderness noted. Chest Wall:  No  deformities, masses, or tenderness noted. Lungs:  B ronchi Heart:  Tachy Abdomen:  S/NT Msk:  No deformity or scoliosis noted of thoracic or lumbar spine.  Good ROM, balance Calves NT Extremities:  no edema lower extremities bilaterally Neurologic:  No cranial nerve deficits noted. Station and gait are normal. Plantar reflexes are down-going bilaterally. DTRs are symmetrical throughout. Sensory, motor and  coordinative functions appear intact. Skin:  Intact without suspicious lesions or rashes Psych:  Cognition and judgment appear intact. Alert and cooperative with normal attention span and concentration. No apparent delusions, illusions, hallucinations   Impression & Recommendations:  Problem # 1:  DYSPNEA (ICD-786.09) due to #2 vs other Assessment New Pt is going to go to [facility] ER. Her updated medication list for this problem includes:    Lasix 40 Mg Tabs (Furosemide) .Marland Kitchen... 1 po as needed    Proair Hfa 108 (90 Base) Mcg/act Aers (Albuterol sulfate) .Marland Kitchen... 2 inh qid as needed  Problem # 2:  BRONCHITIS, ACUTE (ICD-466.0). r/o pneumonia Assessment: Deteriorated  Her updated medication list for this problem includes:    Proair Hfa 108 (90 Base) Mcg/act Aers (Albuterol sulfate) .Marland Kitchen... 2 inh qid as needed  Problem # 3:  HYPOXEMIA (ICD-799.02) Assessment: New Start O2 Given HHN w/Xopenex  Problem # 4:  FATIGUE (ICD-780.79) Assessment: Deteriorated  Problem # 5:  DVT, HX OF (ICD-V12.51) remote Assessment: Comment Only  Her updated medication list for this problem includes:    Adult Aspirin Low Strength 81 Mg Tbdp (Aspirin) ..... Once daily  Complete Medication List: 1)  Centrum Silver Tabs (Multiple vitamins-minerals) .... Once daily 2)  Adult Aspirin Low Strength 81 Mg Tbdp (Aspirin) .... Once daily 3)  Omega-3 1000 Mg Caps (Omega-3 fatty acids) .... Once daily 4)  Calcium-vitamin D 500-200 Mg-unit Tabs (Calcium carbonate-vitamin d) .... Once daily 5)  Lasix 40 Mg Tabs (Furosemide) .Marland Kitchen.. 1 po as needed 6)  Klor-con M20 20 Meq Cr-tabs (Potassium chloride crys cr) .Marland Kitchen.. 1 once daily 7)  Simvastatin 10 Mg Tabs (Simvastatin) .Marland Kitchen.. 1 po qd 8)  Benicar 40 Mg Tabs (Olmesartan medoxomil) .Marland Kitchen.. 1 by mouth once daily 9)  Loratadine 10 Mg Tabs (Loratadine) .Marland Kitchen.. 1 by mouth once daily as needed allergies 10)  Proair Hfa 108 (90 Base) Mcg/act Aers (Albuterol sulfate) .... 2 inh qid as needed 11)   Pennsaid 1.5 % Soln (Diclofenac sodium) .... 3-5 gtt on skin three times a day for pain  Other Orders: T-2 View CXR, Same Day (71020.5TC)

## 2010-05-08 NOTE — Letter (Signed)
Summary: Colonoscopy Letter  [facility]  [address] [place], [place] [address]   Phone: [phone]  Fax: [phone]      June 23, 2009 MRN: [id number]   Diana Stout [address] [place], [place]  [address]   Dear Ms. Mccollister,   According to your medical record, it is time for you to schedule a Colonoscopy. The American Cancer Society recommends this procedure as a method to detect early colon cancer. Patients with a family history of colon cancer, or a personal history of colon polyps or inflammatory bowel disease are at increased risk.  This letter has beeen generated based on the recommendations made at the time of your procedure. If you feel that in your particular situation this may no longer apply, please contact our office.  Please call our office at [phone] to schedule this appointment or to update your records at your earliest convenience.  Thank you for cooperating with [place] to provide you with the very best care possible.   Sincerely,  Hedwig Morton. Juanda Chance, M.D.  [facility] [phone]

## 2010-05-08 NOTE — Assessment & Plan Note (Signed)
Summary: np3/syncope/jml      Allergies Added:   Primary Provider:  Tresa Garter MD  CC:  pt recently passed out yesterday but pt is doing ok today.  History of Present Illness: Diana Stout is seen today at the request of [facility].  She was seen there yesterday for "syncope"  She has no previous history of heart disease.  She was hospitalized earlier this year with pneumonia.  She had taken a as needed diuretic pill the night before for lower extremity edema.  She was at the [facility] outpatient surgical center with a friend and standing for quite some time when she felt dizzy.  She sat down but continued to feel light headed.  She indicates passing out for a short time less than a minute.  There was no SSCP, dyspnea, palpitations but she does indicate some diaphoresis.  She felt back to normal fairly quickly.  ER evaluation showed normal telemetry, normal ECG and negative enzymes with normal CXR.  She feels fine today.  CRF's include HTN and elevated lipids.  Current Problems (verified): 1)  Syncope  (ICD-780.2) 2)  Hypertension  (ICD-401.9) 3)  Hyperlipidemia  (ICD-272.4) 4)  Pneumonia, Hx of  (ICD-V12.60) 5)  Skin Rash  (ICD-782.1) 6)  Anemia, Normocytic  (ICD-285.9) 7)  Hypoxemia  (ICD-799.02) 8)  Foot Pain  (ICD-729.5) 9)  Uti  (ICD-599.0) 10)  Nausea Alone  (ICD-787.02) 11)  Dyspnea  (ICD-786.09) 12)  Fatigue  (ICD-780.79) 13)  Bronchitis, Acute  (ICD-466.0) 14)  Cough  (ICD-786.2) 15)  Esophageal Stricture  (ICD-530.3) 16)  Glucose Intolerance  (ICD-271.3) 17)  Shingles, Hx of  (ICD-V13.8) 18)  Ibs  (ICD-564.1) 19)  Diverticulosis, Colon  (ICD-562.10) 20)  Sinusitis- Acute-nos  (ICD-461.9) 21)  Acute Pharyngitis  (ICD-462) 22)  Osteoporosis  (ICD-733.00) 23)  Gerd  (ICD-530.81) 24)  Dvt, Hx of  (ICD-V12.51) 25)  Family History of Cad Female 1st Degree Relative <50  (ICD-V17.3) 26)  Family History of Cad Female 1st Degree Relative <60  (ICD-V16.49)  Current Medications  (verified): 1)  Centrum Silver   Tabs (Multiple Vitamins-Minerals) .... Once Daily 2)  Adult Aspirin Low Strength 81 Mg  Tbdp (Aspirin) .... Once Daily 3)  Omega-3 1000 Mg  Caps (Omega-3 Fatty Acids) .... Once Daily 4)  Calcium-Vitamin D 500-200 Mg-Unit  Tabs (Calcium Carbonate-Vitamin D) .... Once Daily 5)  Lasix 40 Mg  Tabs (Furosemide) .Marland Kitchen.. 1 Po As Needed 6)  Klor-Con M20 20 Meq Cr-Tabs (Potassium Chloride Crys Cr) .Marland Kitchen.. 1 Once Daily 7)  Simvastatin 10 Mg Tabs (Simvastatin) .Marland Kitchen.. 1 Po Qd 8)  Benicar 40 Mg  Tabs (Olmesartan Medoxomil) .Marland Kitchen.. 1 By Mouth Once Daily  Allergies (verified): 1)  ! Penicillin 2)  ! Rocephin 3)  Lipitor (Atorvastatin Calcium) 4)  * Fosamax 5)  Avelox 6)  Codeine Phosphate (Codeine Phosphate)  Past History:  Past Medical History: Last updated: 09/27/2009 Hypertension Hyperlipidemia 272.0 DVT, hx of RIGHT  98 GERD Osteoporosis  733.00 ESOPHAGITIS  06 esophageal stricture PALPITATIONS 785.1 LEFT KIDNEY CYST  DR. Aldean Ast Diverticulosis, colon IBS Osteoporosis Hx of shingles glucose intolerance Pneumonia, hx of 2011  Past Surgical History: Last updated: 03-30-07 Cholecystectomy Tubal ligation Appendectomy  Family History: Last updated: Mar 30, 2007 father died with liver cancer sister with breast cancer father, 2 bor and sister with DM mother, brother and sister x 2 with asthma Family History of CAD Female 1st degree relative  Social History: Last updated: 09/27/2009  She rarely drinks wine.  She has  never used tobacco or   drugs.  She is active.  Lives independently. Widowed     Social History:  She rarely drinks wine.  She has never used tobacco or   drugs.  She is active.  Lives independently. Widowed     Review of Systems       Denies fever, malais, weight loss, blurry vision, decreased visual acuity, cough, sputum, SOB, hemoptysis, pleuritic pain, palpitaitons, heartburn, abdominal pain, melena, lower extremity edema,  claudication, or rash.   Vital Signs:  Patient profile:   77 year old female Height:      59 inches Weight:      166 pounds BMI:     33.65 Pulse rate:   80 / minute Resp:     14 per minute BP sitting:   130 / 72  (left arm) BP standing:   130 / 80  Vitals Entered By: Kem Parkinson (September 27, 2009 3:22 PM)  Physical Exam  General:  Affect appropriate Healthy:  appears stated age HEENT: normal Neck supple with no adenopathy JVP normal no bruits no thyromegaly Lungs clear with no wheezing and good diaphragmatic motion Heart:  S1/S2 no murmur,rub, gallop or click PMI normal Abdomen: benighn, BS positve, no tenderness, no AAA no bruit.  No HSM or HJR Distal pulses intact with no bruits No edema Neuro non-focal Skin warm and dry    Impression & Recommendations:  Problem # 1:  SYNCOPE (ICD-780.2) No obvious cardiac etiology.  Echo to R/O structural heart disease Her updated medication list for this problem includes:    Adult Aspirin Low Strength 81 Mg Tbdp (Aspirin) ..... Once daily  Orders: Echocardiogram (Echo)  Problem # 2:  HYPERTENSION (ICD-401.9) Well controlled with no postural signs Her updated medication list for this problem includes:    Adult Aspirin Low Strength 81 Mg Tbdp (Aspirin) ..... Once daily    Lasix 40 Mg Tabs (Furosemide) .Marland Kitchen... 1 po as needed    Benicar 40 Mg Tabs (Olmesartan medoxomil) .Marland Kitchen... 1 by mouth once daily  Problem # 3:  HYPERLIPIDEMIA (ICD-272.4) At target with no known vascular disease Her updated medication list for this problem includes:    Simvastatin 10 Mg Tabs (Simvastatin) .Marland Kitchen... 1 po qd  CHOL: 175 (07/11/2009)   LDL: 103 (07/11/2009)   HDL: 50.20 (07/11/2009)   TG: 110.0 (07/11/2009)  Problem # 4:  EDEMA (ICD-782.3) Try to avoid diuretic during hot weather.  Discuss with primary changing to low dose HCTZ or lower dose Lasix like 20mg . Low sodium diet and elevate legs at end of day.    Patient Instructions: 1)  Your  physician recommends that you schedule a follow-up appointment in: AS NEEDED 2)  Your physician recommends that you continue on your current medications as directed. Please refer to the Current Medication list given to you today. 3)  Your physician has requested that you have an echocardiogram.  Echocardiography is a painless test that uses sound waves to create images of your heart. It provides your doctor with information about the size and shape of your heart and how well your heart's chambers and valves are working.  This procedure takes approximately one hour. There are no restrictions for this procedure.   EKG Report  Procedure date:  09/26/2009  Findings:      NSR Normal ECG' PAC

## 2010-05-08 NOTE — Progress Notes (Signed)
Summary: ? Colonoscopy   Phone Note Call from Patient Call back at Home Phone [phone] Call back at Work Phone [phone]   Caller: Patient Call For: Dr. Juanda Chance Reason for Call: Talk to Nurse Details for Reason: ? Colonoscopy Summary of Call: Pt. got recall letter about her colonoscopy. Pt. says she is 79.  Does she really have to have one?  If so, is there any way to get it done before the end of December?  Please call and advise. Initial call taken by: Schuyler Amor,  February 26, 2010 4:27 PM  Follow-up for Phone Call        Patient called to see if "I really need to get a colonoscopy because I am 77 years old." Patient states she is not having any bowel problems at all. She states she got her letter back in 07/2009 but [place] and forgot about it. Please, advise. Follow-up by: Jesse Fall RN,  February 26, 2010 4:44 PM  Additional Follow-up for Phone Call Additional follow up Details #1::        last colon 2004, father had colon cancer. Pt had no polyps. I would recommend colonoscopy one more time , no colonoscopy after 77y.o. Additional Follow-up by: Hart Carwin MD,  February 26, 2010 10:31 PM     Appended Document: ? Colonoscopy Patient given Dr. Regino Schultze recommendations. Schedule patient for colonoscopy on 03/09/10 at 2 PM with 1 PM arrival time. Previsit scheduled for 02/28/10 at 9:30 AM

## 2010-05-10 NOTE — Progress Notes (Signed)
Summary: ?UTI  Phone Note Call from Patient   Caller: Patient Call For: Tresa Garter MD Complaint: Urinary/GYN Problems Summary of Call: Patient thinks she may have a UTI and wants to come in for labs. Please Advise. [phone]. Initial call taken by: Daphane Shepherd,  May 04, 2010 1:51 PM  Follow-up for Phone Call        [facility] per MD - stat u/a 595.0 today, Please help set up [facility] Follow-up by: Lamar Sprinkles, CMA,  May 04, 2010 2:18 PM  Additional Follow-up for Phone Call Additional follow up Details #1::        pt notifed, labs in idx. Additional Follow-up by: Verdell Face,  May 04, 2010 2:21 PM

## 2010-05-14 ENCOUNTER — Encounter (INDEPENDENT_AMBULATORY_CARE_PROVIDER_SITE_OTHER): Payer: Self-pay | Admitting: *Deleted

## 2010-05-15 ENCOUNTER — Encounter: Payer: Self-pay | Admitting: Internal Medicine

## 2010-05-16 NOTE — Progress Notes (Signed)
Summary: U/a  Phone Note Call from Patient Call back at Home Phone [phone] Call back at Work Phone [phone]   Summary of Call: Pt comming in for stat U/A - HOLD Phone note open for results. Initial call taken by: Lamar Sprinkles, CMA,  May 04, 2010 2:34 PM  Follow-up for Phone Call        Results ready, please advise. Follow-up by: Lamar Sprinkles, CMA,  May 04, 2010 6:09 PM  Additional Follow-up for Phone Call Additional follow up Details #1::        weakly positive U/A - plan septra DS two times a day x 5 days. Additional Follow-up by: Jacques Navy MD,  May 04, 2010 6:16 PM    Additional Follow-up for Phone Call Additional follow up Details #2::    Left vm for pt to check w/pharmacy...................Marland KitchenLamar Sprinkles, CMA  May 05, 2010 10:16 AM   left mess to call office back to confirm pt recieved rx..................Marland KitchenLamar Sprinkles, CMA  May 07, 2010 10:16 AM   Pt recieved rx Follow-up by: Lamar Sprinkles, CMA,  May 07, 2010 11:53 AM  New/Updated Medications: SEPTRA DS 800-160 MG TABS (SULFAMETHOXAZOLE-TRIMETHOPRIM) 1 two times a day x 5 days Prescriptions: SEPTRA DS 800-160 MG TABS (SULFAMETHOXAZOLE-TRIMETHOPRIM) 1 two times a day x 5 days  #10 x 0   Entered by:   Lamar Sprinkles, CMA   Authorized by:   Jacques Navy MD   Signed by:   Lamar Sprinkles, CMA on 05/05/2010   Method used:   Electronically to        [facility] [id number] (retail)       [address] [place], [place]  [address]       Ph: [phone]       Fax: [phone]   RxID:   [id number]

## 2010-05-24 NOTE — Letter (Addendum)
Summary: [facility] Instructions  [facility]  [address] [place], [place] [address]   Phone: [phone]  Fax: [phone]       Diana Stout    June 29, 1947    MRN: [id number]       Procedure Day Dorna Bloom:  Jake Shark  05/29/10     Arrival Time:   8:00AM     Procedure Time:  9:00AM     Location of Procedure:                    Juliann Pares  [facility] (4th Floor)   PREPARATION FOR COLONOSCOPY WITH MIRALAX  Starting 5 days prior to your procedure 05/24/10 do not eat nuts, seeds, popcorn, corn, beans, peas,  salads, or any raw vegetables.  Do not take any fiber supplements (e.g. Metamucil, Citrucel, and Benefiber). ____________________________________________________________________________________________________   THE DAY BEFORE YOUR PROCEDURE         DATE: 05/28/10   DAY: MONDAY  1   Drink clear liquids the entire day-NO SOLID FOOD  2   Do not drink anything colored red or purple.  Avoid juices with pulp.  No orange juice.  3   Drink at least 64 oz. (8 glasses) of fluid/clear liquids during the day to prevent dehydration and help the prep work efficiently.  CLEAR LIQUIDS INCLUDE: Water Jello Ice Popsicles Tea (sugar ok, no milk/cream) Powdered fruit flavored drinks Coffee (sugar ok, no milk/cream) Gatorade Juice: apple, white grape, white cranberry  Lemonade Clear bullion, consomm, broth Carbonated beverages (any kind) Strained chicken noodle soup Hard Candy  4   Mix the entire bottle of Miralax with 64 oz. of Gatorade/Powerade in the morning and put in the refrigerator to chill.  5   At 3:00 pm take 2 Dulcolax/Bisacodyl tablets.  6   At 4:30 pm take one Reglan/Metoclopramide tablet.  7  Starting at 5:00 pm drink one 8 oz glass of the Miralax mixture every 15-20 minutes until you have finished drinking the entire 64 oz.  You should finish drinking prep around 7:30 or 8:00 pm.  8   If you are nauseated, you may take the 2nd Reglan/Metoclopramide  tablet at 6:30 pm.        9    At 8:00 pm take 2 more DULCOLAX/Bisacodyl tablets.     THE DAY OF YOUR PROCEDURE      DATE:  05/29/10   DAY: Jake Shark  You may drink clear liquids until 7:00AM  (2 HOURS BEFORE PROCEDURE).   MEDICATION INSTRUCTIONS  Unless otherwise instructed, you should take regular prescription medications with a small sip of water as early as possible the morning of your procedure.    Additional medication instructions: Hold Lasix the morning of procedure.         OTHER INSTRUCTIONS  You will need a responsible adult at least 75 years of age to accompany you and drive you home.   This person must remain in the waiting room during your procedure.  Wear loose fitting clothing that is easily removed.  Leave jewelry and other valuables at home.  However, you may wish to bring a book to read or an iPod/MP3 player to listen to music as you wait for your procedure to start.  Remove all body piercing jewelry and leave at home.  Total time from sign-in until discharge is approximately 2-3 hours.  You should go home directly after your procedure and rest.  You can resume normal activities the day after  your procedure.  The day of your procedure you should not:   Drive   Make legal decisions   Operate machinery   Drink alcohol   Return to work  You will receive specific instructions about eating, activities and medications before you leave.   The above instructions have been reviewed and explained to me by   Wyona Almas RN  May 15, 2010 10:37 AM     I fully understand and can verbalize these instructions _____________________________ Date _______   Appended Document: Miralax Instructions added Reglan to med list- Miralax prep- verbal order to [facility]  Appended Document: Miralax Instructions    Clinical Lists Changes  Medications: Added new medication of REGLAN 10 MG  TABS (METOCLOPRAMIDE HCL) per Miralax prep sheet - Signed Rx of  REGLAN 10 MG  TABS (METOCLOPRAMIDE HCL) per Miralax prep sheet;  #5 x 0;  Signed;  Entered by: Graciella Freer RN;  Authorized by: Graciella Freer RN;  Method used: Historical    Prescriptions: REGLAN 10 MG  TABS (METOCLOPRAMIDE HCL) per Miralax prep sheet  #5 x 0   Entered and Authorized by:   Graciella Freer RN   Signed by:   Graciella Freer RN on 05/22/2010   Method used:   Historical   RxID:   [id number]

## 2010-05-24 NOTE — Miscellaneous (Signed)
Summary: [facility] PREVISIT/PREP  Clinical Lists Changes  Medications: Added new medication of MOVIPREP 100 GM  SOLR (PEG-KCL-NACL-NASULF-NA ASC-C) As per prep instructions. - Signed Rx of MOVIPREP 100 GM  SOLR (PEG-KCL-NACL-NASULF-NA ASC-C) As per prep instructions.;  #1 x 0;  Signed;  Entered by: Wyona Almas RN;  Authorized by: Hart Carwin MD;  Method used: Electronically to [id number] [address] [place], [place], [place]  [address], Ph: [phone], Fax: [phone] Allergies: Changed allergy or adverse reaction from AVELOX to [facility]    Prescriptions: MOVIPREP 100 GM  SOLR (PEG-KCL-NACL-NASULF-NA ASC-C) As per prep instructions.  #1 x 0   Entered by:   Wyona Almas RN   Authorized by:   Hart Carwin MD   Signed by:   Wyona Almas RN on 05/15/2010   Method used:   Electronically to        [facility] [phone]* (retail)       [address] [place], [place]  [address]       Ph: [phone]       Fax: [phone]   RxID:   [id number]

## 2010-05-29 ENCOUNTER — Other Ambulatory Visit: Payer: Self-pay | Admitting: Internal Medicine

## 2010-05-29 ENCOUNTER — Other Ambulatory Visit (AMBULATORY_SURGERY_CENTER): Payer: Medicare Other | Admitting: Internal Medicine

## 2010-05-29 DIAGNOSIS — D126 Benign neoplasm of colon, unspecified: Secondary | ICD-10-CM

## 2010-05-29 DIAGNOSIS — Z1211 Encounter for screening for malignant neoplasm of colon: Secondary | ICD-10-CM

## 2010-05-29 DIAGNOSIS — K5730 Diverticulosis of large intestine without perforation or abscess without bleeding: Secondary | ICD-10-CM

## 2010-06-04 ENCOUNTER — Encounter: Payer: Self-pay | Admitting: Internal Medicine

## 2010-06-05 NOTE — Procedures (Addendum)
Summary: Colonoscopy  Patient: Diana Stout Note: All result statuses are Final unless otherwise noted.  Tests: (1) Colonoscopy (COL)   COL Colonoscopy           DONE     [facility]     [address]     [place], [place]  [address]          COLONOSCOPY PROCEDURE REPORT          PATIENT:  [name], [name]  MR#:  [id number]     BIRTHDATE:  08-04-1930, 79 yrs. old  GENDER:  female     ENDOSCOPIST:  Hedwig Morton. Juanda Chance, MD     REF. BY:     PROCEDURE DATE:  05/29/2010     PROCEDURE:  Colonoscopy 19[address]     ASA CLASS:  Class II     INDICATIONS:  Routine Risk Screening normal colon 1998     MEDICATIONS:   Versed 6 mg, Fentanyl 50 mcg          DESCRIPTION OF PROCEDURE:   After the risks benefits and     alternatives of the procedure were thoroughly explained, informed     consent was obtained.  Digital rectal exam was performed and     revealed no rectal masses.   The LB [id number]-[id number] endoscope     was introduced through the anus and advanced to the cecum, which     was identified by both the appendix and ileocecal valve, without     limitations.  The quality of the prep was good, using MiraLax.     The instrument was then slowly withdrawn as the colon was fully     examined.     <<PROCEDUREIMAGES>>          FINDINGS:  Two polyps were found. two diminutive polyps at 15 cm     2-3 mm The polyps were removed using cold biopsy forceps (see     image5, image4, and image3).  Moderate diverticulosis was found in     the sigmoid colon (see image1).  Otherwise normal colonoscopy     without other polyps, masses, vascular ectasias, or inflammatory     changes (see image2 and image6).   Retroflexed views in the rectum     revealed no abnormalities.    The scope was then withdrawn from     the patient and the procedure completed.          COMPLICATIONS:  None     ENDOSCOPIC IMPRESSION:     1) Two polyps     2) Moderate diverticulosis in the sigmoid colon     3) Otherwise nl  colonoscopy WMO     RECOMMENDATIONS:     1) Await pathology results     2) High fiber diet.     REPEAT EXAM:  In 10 year(s) for. recall may not be neccessary due     to age          ______________________________     Hedwig Morton. Juanda Chance, MD          CC:  Linda Hedges. Plotnikov, MD          n.     eSIGNEDHedwig Morton. Shakara Tweedy at 05/29/2010 09:54 AM          Lorrin Jackson, [id number]  Note: An exclamation mark (!) indicates a result that was not dispersed into the flowsheet. Document Creation Date: 05/29/2010 9:54 AM _______________________________________________________________________  (1) Order result status:  Final Collection or observation date-time: 05/29/2010 09:45 Requested date-time:  Receipt date-time:  Reported date-time:  Referring Physician:   Ordering Physician: Lina Sar [phone]) Specimen Source:  Source: Launa Grill Order Number: [id number] Lab site:   Appended Document: Colonoscopy     Procedures Next Due Date:    Colonoscopy: 05/2020

## 2010-06-14 NOTE — Letter (Signed)
Summary: Patient Notice- Polyp Results  [facility]  [address] [place], [place] [address]   Phone: [phone]  Fax: [phone]        June 04, 2010 MRN: [id number]    Diana Stout [address] [place], [place]  [address]    Dear Ms. Magaw,  I am pleased to inform you that the colon polyp(s) removed during your recent colonoscopy was (were) found to be benign (no cancer detected) upon pathologic examination.The polypd were hyperplastic  ( not precancerous)  I recommend you have a repeat colonoscopy examination in 10_ years to look for recurrent polyps, as having colon polyps increases your risk for having recurrent polyps or even colon cancer in the future.  Should you develop new or worsening symptoms of abdominal pain, bowel habit changes or bleeding from the rectum or bowels, please schedule an evaluation with either your primary care physician or with me.  Additional information/recommendations:  _x_ No further action with gastroenterology is needed at this time. Please      follow-up with your primary care physician for your other healthcare      needs.  __ Please call [phone] to schedule a return visit to review your      situation.  __ Please keep your follow-up visit as already scheduled.  __ Continue treatment plan as outlined the day of your exam.  Please call us if you are having persistent problems or have questions about your condition that have not been fully answered at this time.  Sincerely,  Hart Carwin MD  This letter has been electronically signed by your physician.  Appended Document: Patient Notice- Polyp Results letter mailed

## 2010-06-24 LAB — BASIC METABOLIC PANEL
Calcium: 9.2 mg/dL (ref 8.4–10.5)
Chloride: 103 mEq/L (ref 96–112)
Creatinine, Ser: 0.77 mg/dL (ref 0.4–1.2)
GFR calc Af Amer: >60 mL/min (ref >60)
Sodium: 138 mEq/L (ref 135–145)

## 2010-06-24 LAB — DIFFERENTIAL
Lymphocytes Relative: 29 % (ref 12–46)
Lymphs Abs: 1.3 10*3/uL (ref 0.7–4.0)
Monocytes Absolute: 0.4 10*3/uL (ref 0.1–1.0)
Monocytes Relative: 8 % (ref 3–12)
Neutro Abs: 2.8 10*3/uL (ref 1.7–7.7)
Neutrophils Relative %: 61 % (ref 43–77)

## 2010-06-24 LAB — URINE MICROSCOPIC-ADD ON

## 2010-06-24 LAB — URINALYSIS, ROUTINE W REFLEX MICROSCOPIC
Glucose, UA: NEGATIVE mg/dL
Hgb urine dipstick: NEGATIVE
Ketones, ur: NEGATIVE mg/dL
Protein, ur: NEGATIVE mg/dL
pH: 7 (ref 5.0–8.0)

## 2010-06-24 LAB — POCT CARDIAC MARKERS
CKMB, poc: <1 ng/mL — ABNORMAL LOW (ref 1.0–8.0)
Myoglobin, poc: 59.1 ng/mL (ref 12–200)
Troponin i, poc: <0.05 ng/mL (ref 0.00–0.09)

## 2010-06-24 LAB — CBC
Hemoglobin: 12.4 g/dL (ref 12.0–15.0)
RBC: 4.22 MIL/uL (ref 3.87–5.11)
WBC: 4.6 10*3/uL (ref 4.0–10.5)

## 2010-06-26 LAB — CBC
MCHC: 34 g/dL (ref 30.0–36.0)
MCHC: 34.1 g/dL (ref 30.0–36.0)
MCV: 85.6 fL (ref 78.0–100.0)
MCV: 86.7 fL (ref 78.0–100.0)
Platelets: 144 10*3/uL — ABNORMAL LOW (ref 150–400)
Platelets: 152 10*3/uL (ref 150–400)
RBC: 4.3 MIL/uL (ref 3.87–5.11)
RDW: 14.8 % (ref 11.5–15.5)
WBC: 3.6 10*3/uL — ABNORMAL LOW (ref 4.0–10.5)
WBC: 3.7 10*3/uL — ABNORMAL LOW (ref 4.0–10.5)

## 2010-06-26 LAB — CARDIAC PANEL(CRET KIN+CKTOT+MB+TROPI)
Relative Index: 0.8 (ref 0.0–2.5)
Relative Index: 1 (ref 0.0–2.5)
Total CK: 298 U/L — ABNORMAL HIGH (ref 7–177)
Troponin I: 0.01 ng/mL (ref 0.00–0.06)
Troponin I: 0.02 ng/mL (ref 0.00–0.06)

## 2010-06-26 LAB — DIFFERENTIAL
Basophils Absolute: 0 10*3/uL (ref 0.0–0.1)
Basophils Relative: 0 % (ref 0–1)
Lymphocytes Relative: 25 % (ref 12–46)
Monocytes Relative: 10 % (ref 3–12)
Neutro Abs: 3 10*3/uL (ref 1.7–7.7)
Neutrophils Relative %: 65 % (ref 43–77)

## 2010-06-26 LAB — BASIC METABOLIC PANEL
BUN: 14 mg/dL (ref 6–23)
CO2: 26 mEq/L (ref 19–32)
CO2: 27 mEq/L (ref 19–32)
Calcium: 8.2 mg/dL — ABNORMAL LOW (ref 8.4–10.5)
Chloride: 111 mEq/L (ref 96–112)
Chloride: 113 mEq/L — ABNORMAL HIGH (ref 96–112)
Creatinine, Ser: 0.75 mg/dL (ref 0.4–1.2)
Creatinine, Ser: 0.82 mg/dL (ref 0.4–1.2)
GFR calc Af Amer: >60 mL/min (ref >60)
GFR calc Af Amer: >60 mL/min (ref >60)
GFR calc non Af Amer: >60 mL/min (ref >60)
Glucose, Bld: 98 mg/dL (ref 70–99)
Potassium: 3.7 mEq/L (ref 3.5–5.1)
Sodium: 140 mEq/L (ref 135–145)

## 2010-06-26 LAB — CULTURE, BLOOD (ROUTINE X 2): Culture: NO GROWTH

## 2010-06-26 LAB — URINALYSIS, ROUTINE W REFLEX MICROSCOPIC
Hgb urine dipstick: NEGATIVE
Nitrite: NEGATIVE
Protein, ur: NEGATIVE mg/dL
Urobilinogen, UA: 0.2 mg/dL (ref 0.0–1.0)

## 2010-06-26 LAB — URINE CULTURE: Colony Count: 50000

## 2010-06-26 LAB — CK TOTAL AND CKMB (NOT AT ARMC)
CK, MB: 3.3 ng/mL (ref 0.3–4.0)
Relative Index: 0.9 (ref 0.0–2.5)

## 2010-06-26 LAB — URINE MICROSCOPIC-ADD ON

## 2010-06-26 LAB — TSH: TSH: 0.356 u[IU]/mL (ref 0.350–4.500)

## 2010-06-26 LAB — HEPATIC FUNCTION PANEL
AST: 47 U/L — ABNORMAL HIGH (ref 0–37)
Albumin: 2.8 g/dL — ABNORMAL LOW (ref 3.5–5.2)

## 2010-06-26 LAB — LIPID PANEL
Cholesterol: 125 mg/dL (ref 0–200)
HDL: 23 mg/dL — ABNORMAL LOW (ref >39)

## 2010-07-17 ENCOUNTER — Other Ambulatory Visit: Payer: Self-pay | Admitting: Internal Medicine

## 2010-07-31 ENCOUNTER — Other Ambulatory Visit: Payer: Self-pay

## 2010-07-31 ENCOUNTER — Other Ambulatory Visit: Payer: Self-pay | Admitting: Internal Medicine

## 2010-07-31 DIAGNOSIS — E039 Hypothyroidism, unspecified: Secondary | ICD-10-CM

## 2010-07-31 DIAGNOSIS — E785 Hyperlipidemia, unspecified: Secondary | ICD-10-CM

## 2010-07-31 DIAGNOSIS — Z0000 Encounter for general adult medical examination without abnormal findings: Secondary | ICD-10-CM

## 2010-08-07 ENCOUNTER — Ambulatory Visit: Payer: Self-pay | Admitting: Internal Medicine

## 2010-08-13 ENCOUNTER — Ambulatory Visit: Payer: Self-pay | Admitting: Internal Medicine

## 2010-08-24 NOTE — Op Note (Signed)
   NAME:  Diana Stout, Diana Stout                         ACCOUNT NO.:  [id number]   MEDICAL RECORD NO.:  [id number]                   PATIENT TYPE:  AMB   LOCATION:  ENDO                                 FACILITY:  [facility]   PHYSICIAN:  Lina Sar, M.D. [facility]               DATE OF BIRTH:  09/25/1930   DATE OF PROCEDURE:  DATE OF DISCHARGE:                                 OPERATIVE REPORT   NAME OF PROCEDURE:  Colonoscopy.   INDICATIONS:  This 77 year old white female has a family history of colon  cancer in her father who had metastatic disease to his liver.  She had a  colonoscopy in 1998 which was essentially normal.  She is undergoing repeat  colonoscopy for neoplastic screening.   ENDOSCOPE:  Olympus single channel video endoscope.   SEDATION:  Versed 5 mg IV, Demerol 70 mg IV.   FINDINGS:  Olympus single channel video endoscope passed into the rectum to  sigmoid colon.  The patient was monitored by pulse oximeter.  Oxygen  saturations were normal.  The prep was excellent.  Anal canal, rectal  ampulla was unremarkable.  There were numerous diverticula in the sigmoid  colon.  __________  transverse colon, hepatic flexure, and ascending colon  were normal including cecum.  Cecal pouch, ileocecal valve were normal.  Colonoscope was then retracted.  No polyps were seen throughout the colon.  Again, multiple diverticula were noted in the sigmoid colon.  The patient  tolerated procedure well.   IMPRESSION:  Moderate diverticulosis in the left colon.   PLAN:  Since her last examination patient has developed some diverticulosis  which is more likely to be progressive.  She was asked to stay on high fiber  diet and begin fiber supplements, psyllium one tablespoon a day.                                               Lina Sar, M.D. [facility]    DB/MEDQ  D:  07/21/2002  T:  07/21/2002  Job:  [id number]

## 2010-09-04 ENCOUNTER — Other Ambulatory Visit (INDEPENDENT_AMBULATORY_CARE_PROVIDER_SITE_OTHER): Payer: Medicare Other

## 2010-09-04 ENCOUNTER — Other Ambulatory Visit (INDEPENDENT_AMBULATORY_CARE_PROVIDER_SITE_OTHER): Payer: Medicare Other | Admitting: Internal Medicine

## 2010-09-04 DIAGNOSIS — E039 Hypothyroidism, unspecified: Secondary | ICD-10-CM

## 2010-09-04 DIAGNOSIS — E785 Hyperlipidemia, unspecified: Secondary | ICD-10-CM

## 2010-09-04 DIAGNOSIS — Z0000 Encounter for general adult medical examination without abnormal findings: Secondary | ICD-10-CM

## 2010-09-04 LAB — CBC WITH DIFFERENTIAL/PLATELET
Basophils Absolute: 0 10*3/uL (ref 0.0–0.1)
Eosinophils Absolute: 0.1 10*3/uL (ref 0.0–0.7)
Lymphocytes Relative: 40 % (ref 12.0–46.0)
Lymphs Abs: 1.6 10*3/uL (ref 0.7–4.0)
MCHC: 33.8 g/dL (ref 30.0–36.0)
Monocytes Relative: 7.6 % (ref 3.0–12.0)
Platelets: 181 10*3/uL (ref 150.0–400.0)
RDW: 15.4 % — ABNORMAL HIGH (ref 11.5–14.6)

## 2010-09-04 LAB — LIPID PANEL
Cholesterol: 163 mg/dL (ref 0–200)
HDL: 48.3 mg/dL (ref >39.00)
Triglycerides: 102 mg/dL (ref 0.0–149.0)

## 2010-09-04 LAB — URINALYSIS, ROUTINE W REFLEX MICROSCOPIC
Bilirubin Urine: NEGATIVE
Nitrite: NEGATIVE
Urobilinogen, UA: 0.2 (ref 0.0–1.0)
pH: 5 (ref 5.0–8.0)

## 2010-09-04 LAB — BASIC METABOLIC PANEL
Calcium: 8.7 mg/dL (ref 8.4–10.5)
GFR: 90.59 mL/min (ref >60.00)
Sodium: 142 mEq/L (ref 135–145)

## 2010-09-04 LAB — TSH: TSH: 0.73 u[IU]/mL (ref 0.35–5.50)

## 2010-09-04 LAB — HEPATIC FUNCTION PANEL
ALT: 16 U/L (ref 0–35)
AST: 19 U/L (ref 0–37)
Albumin: 3.3 g/dL — ABNORMAL LOW (ref 3.5–5.2)
Alkaline Phosphatase: 66 U/L (ref 39–117)

## 2010-09-07 ENCOUNTER — Encounter: Payer: Self-pay | Admitting: Internal Medicine

## 2010-09-10 ENCOUNTER — Encounter: Payer: Self-pay | Admitting: Internal Medicine

## 2010-09-10 ENCOUNTER — Ambulatory Visit (INDEPENDENT_AMBULATORY_CARE_PROVIDER_SITE_OTHER): Payer: Medicare Other | Admitting: Internal Medicine

## 2010-09-10 DIAGNOSIS — R5381 Other malaise: Secondary | ICD-10-CM

## 2010-09-10 DIAGNOSIS — Z0000 Encounter for general adult medical examination without abnormal findings: Secondary | ICD-10-CM

## 2010-09-10 DIAGNOSIS — I10 Essential (primary) hypertension: Secondary | ICD-10-CM

## 2010-09-10 DIAGNOSIS — E785 Hyperlipidemia, unspecified: Secondary | ICD-10-CM

## 2010-09-10 DIAGNOSIS — R5383 Other fatigue: Secondary | ICD-10-CM

## 2010-09-10 DIAGNOSIS — M79609 Pain in unspecified limb: Secondary | ICD-10-CM

## 2010-09-10 MED ORDER — OLMESARTAN MEDOXOMIL 20 MG PO TABS: 20.0000 mg | ORAL_TABLET | Freq: Every day | ORAL | Status: DC

## 2010-09-10 NOTE — Assessment & Plan Note (Signed)
We can inject the joint

## 2010-09-10 NOTE — Progress Notes (Signed)
  Subjective:    Patient ID: Diana Stout, female    DOB: 13-May-1930, 77 y.o.   MRN: [id number]  HPI The patient presents for a follow-up of  chronic hypertension, chronic dyslipidemia, OA controlled with medicines      Review of Systems  Constitutional: Positive for fatigue. Negative for chills, activity change, appetite change and unexpected weight change.  HENT: Negative for congestion, mouth sores and sinus pressure.   Eyes: Negative for visual disturbance.  Respiratory: Negative for cough and chest tightness.   Gastrointestinal: Negative for nausea and abdominal pain.  Genitourinary: Negative for frequency, difficulty urinating and vaginal pain.  Musculoskeletal: Positive for gait problem (r foot pain). Negative for back pain.  Skin: Negative for pallor and rash.  Neurological: Negative for dizziness, tremors, weakness, numbness and headaches.  Psychiatric/Behavioral: Negative for confusion and sleep disturbance.       Objective:   Physical Exam  Constitutional: She appears well-developed and well-nourished. No distress.       Obese, in NAD  HENT:  Head: Normocephalic.  Right Ear: External ear normal.  Left Ear: External ear normal.  Nose: Nose normal.  Mouth/Throat: Oropharynx is clear and moist.  Eyes: Conjunctivae are normal. Pupils are equal, round, and reactive to light. Right eye exhibits no discharge. Left eye exhibits no discharge.  Neck: Normal range of motion. Neck supple. No JVD present. No tracheal deviation present. No thyromegaly present.  Cardiovascular: Normal rate, regular rhythm and normal heart sounds.   Pulmonary/Chest: No stridor. No respiratory distress. She has no wheezes.  Abdominal: Soft. Bowel sounds are normal. She exhibits no distension and no mass. There is no tenderness. There is no rebound and no guarding.  Musculoskeletal: She exhibits tenderness (R midfoot pain). She exhibits no edema.  Lymphadenopathy:    She has no cervical  adenopathy.  Neurological: She displays normal reflexes. No cranial nerve deficit. She exhibits normal muscle tone. Coordination normal.  Skin: No rash noted. No erythema.  Psychiatric: She has a normal mood and affect. Her behavior is normal. Judgment and thought content normal.        Lab Results  Component Value Date   WBC 3.9* 09/04/2010   HGB 11.7* 09/04/2010   HCT 34.4* 09/04/2010   PLT 181.0 09/04/2010   CHOL 163 09/04/2010   TRIG 102.0 09/04/2010   HDL 48.30 09/04/2010   ALT 16 09/04/2010   AST 19 09/04/2010   NA 142 09/04/2010   K 4.5 09/04/2010   CL 109 09/04/2010   CREATININE 0.7 09/04/2010   BUN 23 09/04/2010   CO2 28 09/04/2010   TSH 0.73 09/04/2010   HGBA1C 5.9 07/11/2009     Assessment & Plan:

## 2010-09-10 NOTE — Assessment & Plan Note (Signed)
Cut back on benicar

## 2010-09-10 NOTE — Assessment & Plan Note (Signed)
On Rx 

## 2010-09-10 NOTE — Assessment & Plan Note (Signed)
See Meds change

## 2010-11-22 ENCOUNTER — Ambulatory Visit (INDEPENDENT_AMBULATORY_CARE_PROVIDER_SITE_OTHER): Payer: Medicare Other | Admitting: Internal Medicine

## 2010-11-22 ENCOUNTER — Encounter: Payer: Self-pay | Admitting: Internal Medicine

## 2010-11-22 ENCOUNTER — Other Ambulatory Visit (INDEPENDENT_AMBULATORY_CARE_PROVIDER_SITE_OTHER): Payer: Medicare Other

## 2010-11-22 VITALS — BP 118/82 | HR 102 | Temp 98.8°F | Ht 59.0 in | Wt 165.4 lb

## 2010-11-22 DIAGNOSIS — R7302 Impaired glucose tolerance (oral): Secondary | ICD-10-CM | POA: Insufficient documentation

## 2010-11-22 DIAGNOSIS — R1030 Lower abdominal pain, unspecified: Secondary | ICD-10-CM

## 2010-11-22 DIAGNOSIS — D649 Anemia, unspecified: Secondary | ICD-10-CM

## 2010-11-22 DIAGNOSIS — I10 Essential (primary) hypertension: Secondary | ICD-10-CM

## 2010-11-22 DIAGNOSIS — R109 Unspecified abdominal pain: Secondary | ICD-10-CM

## 2010-11-22 DIAGNOSIS — Z0000 Encounter for general adult medical examination without abnormal findings: Secondary | ICD-10-CM | POA: Insufficient documentation

## 2010-11-22 DIAGNOSIS — R7309 Other abnormal glucose: Secondary | ICD-10-CM

## 2010-11-22 HISTORY — DX: Lower abdominal pain, unspecified: R10.30

## 2010-11-22 HISTORY — DX: Impaired glucose tolerance (oral): R73.02

## 2010-11-22 LAB — URINALYSIS, ROUTINE W REFLEX MICROSCOPIC
Nitrite: NEGATIVE
Specific Gravity, Urine: >=1.03 (ref 1.000–1.030)
Total Protein, Urine: NEGATIVE
pH: 5.5 (ref 5.0–8.0)

## 2010-11-22 LAB — CBC WITH DIFFERENTIAL/PLATELET
Basophils Absolute: 0 10*3/uL (ref 0.0–0.1)
Basophils Relative: 0.4 % (ref 0.0–3.0)
Eosinophils Absolute: 0.1 10*3/uL (ref 0.0–0.7)
HCT: 38.2 % (ref 36.0–46.0)
Hemoglobin: 12.5 g/dL (ref 12.0–15.0)
Lymphocytes Relative: 29.2 % (ref 12.0–46.0)
Lymphs Abs: 2.2 10*3/uL (ref 0.7–4.0)
MCHC: 32.8 g/dL (ref 30.0–36.0)
MCV: 85.4 fl (ref 78.0–100.0)
Monocytes Absolute: 0.7 10*3/uL (ref 0.1–1.0)
Neutro Abs: 4.4 10*3/uL (ref 1.4–7.7)
RBC: 4.47 Mil/uL (ref 3.87–5.11)
RDW: 15.2 % — ABNORMAL HIGH (ref 11.5–14.6)

## 2010-11-22 MED ORDER — FUROSEMIDE 40 MG PO TABS: 40.0000 mg | ORAL_TABLET | Freq: Every day | ORAL | Status: DC | PRN

## 2010-11-22 NOTE — Progress Notes (Signed)
Subjective:    Patient ID: Diana Stout, female    DOB: [birth year], 77 y.o.   MRN: [id number]  HPI  Here to c/o bilat lower abd pain, 3 days, mild to mod, without hx of ongoing prior problems except for hx of diverticultitis, + bloating, but no n/v, no fever, did some radiate to the lower back but not sure since she has back pain recurrant as well,  Pain somewhat similar to IBS but has had no symtpoms recently snce husband ill and died aabout 16 yrs ago. May have had one dark stool.  Only takes alleve OTC  - 1 per day for 1 wk, no upper abd pain and Not curetnly on PPI. Denies urinary symptoms such as dysuria, frequency, or hematuria but has had some urgency for about 3 mo.  Does have mild incontinence.  Has known left renal cyst that is followed by urology.  Did have colnosocpy per Dr Juanda Chance about feb 2012 with 2 polyps (benign) and diverticulosis only.  Has lost 3-4 lbs but trying to lose wt.  Has had more stress recently with ill son with mult serious illness (DVT, pna and other) and daughter with recent ear malignancy.  Sister is now with hospice and pt has been sitting with her recently.   Pt denies polydipsia, polyuria.  Pt denies chest pain, increased sob or doe, wheezing, orthopnea, PND, increased LE swelling, palpitations, dizziness or syncope. Past Medical History  Diagnosis Date  . Hypertension   . Hyperlipidemia   . DVT (deep venous thrombosis) 1998    hx of right  . GERD (gastroesophageal reflux disease)   . Osteoporosis   . Esophagitis 2006  . Esophageal stricture   . Palpitation   . Kidney cysts     left- Dr Aldean Ast  . Diverticulosis of colon   . IBS (irritable bowel syndrome)   . History of shingles   . Glucose intolerance (impaired glucose tolerance)   . Pneumonia 2011    hx of  . Impaired glucose tolerance 11/22/2010   Past Surgical History  Procedure Date  . Cholecystectomy   . Tubal ligation   . Appendectomy     reports that she has never smoked. She does not  have any smokeless tobacco history on file. She reports that she drinks alcohol. She reports that she does not use illicit drugs. family history includes Asthma in her brother, mother, and sisters; Cancer in her father and sister; Coronary artery disease in her other; and Diabetes in her brothers, father, and sister. Allergies  Allergen Reactions  . Alendronate Sodium     REACTION: esophagus problem  . Atorvastatin     REACTION: Gi pain  . Ceftriaxone Sodium     REACTION: rash  . Codeine Phosphate     REACTION: nightmares  . Moxifloxacin     REACTION: nausea/chest pain  . Penicillins    Review of Systems Review of Systems  Constitutional: Negative for diaphoresis and unexpected weight change.  HENT: Negative for drooling and tinnitus.   Eyes: Negative for photophobia and visual disturbance.  Respiratory: Negative for choking and stridor.   Gastrointestinal: Negative for vomiting and blood in stool.  Genitourinary: Negative for hematuria and decreased urine volume.    Objective:   Physical Exam BP 118/82  Pulse 102  Temp(Src) 98.8 F (37.1 C) (Oral)  Ht 4\' 11"  (1.499 m)  Wt 165 lb 6 oz (75.014 kg)  BMI 33.40 kg/m2  SpO2 95% Physical Exam  VS noted Constitutional: Pt  appears well-developed and well-nourished.  HENT: Head: Normocephalic.  Right Ear: External ear normal.  Left Ear: External ear normal.  Eyes: Conjunctivae and EOM are normal. Pupils are equal, round, and reactive to light.  Neck: Normal range of motion. Neck supple.  Cardiovascular: Normal rate and regular rhythm.   Pulmonary/Chest: Effort normal and breath sounds normal.  Abd:  Soft, NT, non-distended, + BS Neurological: Pt is alert. No cranial nerve deficit.  Skin: Skin is warm. No erythema.  Psychiatric: Pt behavior is normal. Thought content normal. 1+ nervous       Assessment & Plan:

## 2010-11-22 NOTE — Patient Instructions (Signed)
Continue all other medications as before Please go to LAB in the Basement for the blood and/or urine tests to be done today Please call the phone number [phone] (the PhoneTree System) for results of testing in 2-3 days;  When calling, simply dial the number, and when prompted enter the MRN number above (the Medical Record Number) and the # key, then the message should start.  

## 2010-11-25 ENCOUNTER — Encounter: Payer: Self-pay | Admitting: Internal Medicine

## 2010-11-25 NOTE — Assessment & Plan Note (Signed)
?   Etiology,  Exam benign, suspect functional pain; for lab eval as per emr, tx pending results

## 2010-11-25 NOTE — Assessment & Plan Note (Signed)
For repeat cbc, with iron/b12 as well

## 2010-11-25 NOTE — Assessment & Plan Note (Signed)
stable overall by hx and exam, most recent data reviewed with pt, and pt to continue medical treatment as before  Lab Results  Component Value Date   HGBA1C 5.9 07/11/2009

## 2010-11-25 NOTE — Assessment & Plan Note (Signed)
stable overall by hx and exam, most recent data reviewed with pt, and pt to continue medical treatment as before  BP Readings from Last 3 Encounters:  11/22/10 118/82  09/10/10 100/62  02/15/10 104/70

## 2010-11-27 ENCOUNTER — Telehealth: Payer: Self-pay | Admitting: *Deleted

## 2010-11-27 NOTE — Telephone Encounter (Signed)
Patient requesting a call with results from when she was seen last. I see that Dr Jonny Ruiz put message on phone tree - labs normal, f/u w/worsening symptoms. Need to call pt to inform.

## 2010-11-27 NOTE — Telephone Encounter (Signed)
Pt informed

## 2011-01-01 ENCOUNTER — Ambulatory Visit (INDEPENDENT_AMBULATORY_CARE_PROVIDER_SITE_OTHER): Payer: Medicare Other | Admitting: Internal Medicine

## 2011-01-01 ENCOUNTER — Encounter: Payer: Self-pay | Admitting: Internal Medicine

## 2011-01-01 VITALS — BP 120/74 | HR 88 | Temp 97.5°F | Resp 16 | Wt 164.0 lb

## 2011-01-01 DIAGNOSIS — J069 Acute upper respiratory infection, unspecified: Secondary | ICD-10-CM

## 2011-01-01 DIAGNOSIS — E785 Hyperlipidemia, unspecified: Secondary | ICD-10-CM

## 2011-01-01 DIAGNOSIS — I10 Essential (primary) hypertension: Secondary | ICD-10-CM

## 2011-01-01 HISTORY — DX: Acute upper respiratory infection, unspecified: J06.9

## 2011-01-01 MED ORDER — VITAMIN D 1000 UNITS PO TABS: 1000.0000 [IU] | ORAL_TABLET | Freq: Every day | ORAL | Status: DC

## 2011-01-01 MED ORDER — CHLORPHENIRAMINE-HYDROCODONE 8-10 MG/5ML PO LQCR: 5.0000 mL | Freq: Two times a day (BID) | ORAL | Status: DC | PRN

## 2011-01-01 MED ORDER — HYDROCOD POLST-CHLORPHEN POLST 10-8 MG/5ML PO LQCR: 5.0000 mL | Freq: Two times a day (BID) | ORAL | Status: DC

## 2011-01-01 MED ORDER — AZITHROMYCIN 250 MG PO TABS: ORAL_TABLET | ORAL | Status: AC

## 2011-01-01 MED ORDER — SIMVASTATIN 10 MG PO TABS: 10.0000 mg | ORAL_TABLET | Freq: Every day | ORAL | Status: DC

## 2011-01-01 NOTE — Assessment & Plan Note (Signed)
Continue with current prescription therapy as reflected on the Med list.  

## 2011-01-01 NOTE — Assessment & Plan Note (Addendum)
Take Z pac

## 2011-01-01 NOTE — Progress Notes (Signed)
  Subjective:    Patient ID: Diana Stout, female    DOB: 11-Mar-1931, 77 y.o.   MRN: [id number]  HPI   HPI  C/o URI sx's x   4 days. C/o ST, cough, weakness. Not better with OTC medicines. Actually, the patient is getting worse. The patient did not sleep last night due to cough. F/u HTN, dyslipidemia  Review of Systems  Constitutional: Positive for fever, chills and fatigue.  HENT: Positive for congestion, rhinorrhea, sneezing and postnasal drip.   Eyes: Positive for photophobia and pain. Negative for discharge and visual disturbance.  Respiratory: Positive for cough .   Positive for chest pain.  Gastrointestinal: Negative for vomiting, abdominal pain, diarrhea and abdominal distention.  Genitourinary: Negative for dysuria and difficulty urinating.  Skin: Negative for rash.  Neurological: Positive for dizziness, weakness and light-headedness.    Wt Readings from Last 3 Encounters:  01/01/11 164 lb (74.39 kg)  11/22/10 165 lb 6 oz (75.014 kg)  09/10/10 169 lb (76.658 kg)     Review of Systems     Objective:   Physical Exam  Constitutional: She appears well-developed. No distress.       obese  HENT:  Head: Normocephalic.  Right Ear: External ear normal.  Left Ear: External ear normal.  Nose: Nose normal.  Mouth/Throat: Oropharynx is clear and moist.       R TM red  Eyes: Conjunctivae are normal. Pupils are equal, round, and reactive to light. Right eye exhibits no discharge. Left eye exhibits no discharge.  Neck: Normal range of motion. Neck supple. No JVD present. No tracheal deviation present. No thyromegaly present.  Cardiovascular: Normal rate, regular rhythm and normal heart sounds.   Pulmonary/Chest: No stridor. No respiratory distress. She has no wheezes.  Abdominal: Soft. Bowel sounds are normal. She exhibits no distension and no mass. There is no tenderness. There is no rebound and no guarding.  Musculoskeletal: She exhibits no edema and no tenderness.    Lymphadenopathy:    She has no cervical adenopathy.  Neurological: She displays normal reflexes. No cranial nerve deficit. She exhibits normal muscle tone. Coordination normal.  Skin: No rash noted. No erythema.  Psychiatric: She has a normal mood and affect. Her behavior is normal. Judgment and thought content normal.          Assessment & Plan:

## 2011-03-12 ENCOUNTER — Ambulatory Visit: Payer: Medicare Other | Admitting: Internal Medicine

## 2011-04-26 ENCOUNTER — Other Ambulatory Visit: Payer: Self-pay | Admitting: Dermatology

## 2011-04-26 DIAGNOSIS — D0470 Carcinoma in situ of skin of unspecified lower limb, including hip: Secondary | ICD-10-CM | POA: Diagnosis not present

## 2011-04-26 DIAGNOSIS — L821 Other seborrheic keratosis: Secondary | ICD-10-CM | POA: Diagnosis not present

## 2011-04-26 DIAGNOSIS — D0439 Carcinoma in situ of skin of other parts of face: Secondary | ICD-10-CM | POA: Diagnosis not present

## 2011-04-26 DIAGNOSIS — C44320 Squamous cell carcinoma of skin of unspecified parts of face: Secondary | ICD-10-CM | POA: Diagnosis not present

## 2011-04-26 DIAGNOSIS — C44721 Squamous cell carcinoma of skin of unspecified lower limb, including hip: Secondary | ICD-10-CM | POA: Diagnosis not present

## 2011-05-01 ENCOUNTER — Other Ambulatory Visit (INDEPENDENT_AMBULATORY_CARE_PROVIDER_SITE_OTHER): Payer: Medicare Other

## 2011-05-01 DIAGNOSIS — I10 Essential (primary) hypertension: Secondary | ICD-10-CM

## 2011-05-01 DIAGNOSIS — E785 Hyperlipidemia, unspecified: Secondary | ICD-10-CM | POA: Diagnosis not present

## 2011-05-01 DIAGNOSIS — Z0000 Encounter for general adult medical examination without abnormal findings: Secondary | ICD-10-CM

## 2011-05-01 LAB — COMPREHENSIVE METABOLIC PANEL
ALT: 18 U/L (ref 0–35)
AST: 15 U/L (ref 0–37)
BUN: 17 mg/dL (ref 6–23)
Calcium: 9 mg/dL (ref 8.4–10.5)
Creatinine, Ser: 0.7 mg/dL (ref 0.4–1.2)
Total Bilirubin: 0.8 mg/dL (ref 0.3–1.2)

## 2011-05-01 LAB — URINALYSIS, ROUTINE W REFLEX MICROSCOPIC
Bilirubin Urine: NEGATIVE
Hgb urine dipstick: NEGATIVE
Nitrite: NEGATIVE
Urobilinogen, UA: 0.2 (ref 0.0–1.0)

## 2011-05-01 LAB — CBC WITH DIFFERENTIAL/PLATELET
Basophils Relative: 0.7 % (ref 0.0–3.0)
Eosinophils Absolute: 0.1 10*3/uL (ref 0.0–0.7)
Eosinophils Relative: 2.9 % (ref 0.0–5.0)
Lymphocytes Relative: 33.3 % (ref 12.0–46.0)
MCHC: 33.5 g/dL (ref 30.0–36.0)
MCV: 85.1 fl (ref 78.0–100.0)
Monocytes Absolute: 0.3 10*3/uL (ref 0.1–1.0)
Neutrophils Relative %: 54.9 % (ref 43.0–77.0)
Platelets: 178 10*3/uL (ref 150.0–400.0)
RBC: 4.31 Mil/uL (ref 3.87–5.11)
WBC: 4 10*3/uL — ABNORMAL LOW (ref 4.5–10.5)

## 2011-05-01 LAB — TSH: TSH: 0.74 u[IU]/mL (ref 0.35–5.50)

## 2011-05-01 LAB — LIPID PANEL
HDL: 50.3 mg/dL (ref >39.00)
Triglycerides: 107 mg/dL (ref 0.0–149.0)
VLDL: 21.4 mg/dL (ref 0.0–40.0)

## 2011-05-08 ENCOUNTER — Encounter: Payer: Self-pay | Admitting: Internal Medicine

## 2011-05-08 ENCOUNTER — Ambulatory Visit (INDEPENDENT_AMBULATORY_CARE_PROVIDER_SITE_OTHER): Payer: Medicare Other | Admitting: Internal Medicine

## 2011-05-08 VITALS — BP 98/70 | HR 80 | Temp 96.2°F | Resp 16 | Wt 170.0 lb

## 2011-05-08 DIAGNOSIS — E785 Hyperlipidemia, unspecified: Secondary | ICD-10-CM | POA: Diagnosis not present

## 2011-05-08 DIAGNOSIS — I10 Essential (primary) hypertension: Secondary | ICD-10-CM

## 2011-05-08 DIAGNOSIS — J984 Other disorders of lung: Secondary | ICD-10-CM | POA: Diagnosis not present

## 2011-05-08 DIAGNOSIS — R7309 Other abnormal glucose: Secondary | ICD-10-CM

## 2011-05-08 DIAGNOSIS — R7302 Impaired glucose tolerance (oral): Secondary | ICD-10-CM

## 2011-05-08 DIAGNOSIS — R911 Solitary pulmonary nodule: Secondary | ICD-10-CM

## 2011-05-08 HISTORY — DX: Solitary pulmonary nodule: R91.1

## 2011-05-08 MED ORDER — SIMVASTATIN 10 MG PO TABS: 10.0000 mg | ORAL_TABLET | Freq: Every day | ORAL | Status: DC

## 2011-05-08 MED ORDER — OLMESARTAN MEDOXOMIL 20 MG PO TABS: 20.0000 mg | ORAL_TABLET | Freq: Every day | ORAL | Status: DC

## 2011-05-08 MED ORDER — NAPROXEN 500 MG PO TABS: 500.0000 mg | ORAL_TABLET | Freq: Two times a day (BID) | ORAL | Status: DC

## 2011-05-08 NOTE — Progress Notes (Signed)
  Subjective:    Patient ID: Diana Stout, female    DOB: Sep 03, 1930, 78 y.o.   MRN: [id number]  HPI  The patient presents for a follow-up of  chronic hypertension, chronic dyslipidemia, GERG controlled with medicines. C/o feeling sluggish after lunch; gained wt  Wt Readings from Last 3 Encounters:  05/08/11 170 lb 0.6 oz (77.13 kg)  01/01/11 164 lb (74.39 kg)  11/22/10 165 lb 6 oz (75.014 kg)      Review of Systems  Constitutional: Positive for unexpected weight change. Negative for chills, activity change, appetite change and fatigue.  HENT: Negative for congestion, mouth sores and sinus pressure.   Eyes: Negative for visual disturbance.  Respiratory: Negative for cough and chest tightness.   Gastrointestinal: Negative for nausea and abdominal pain.  Genitourinary: Negative for frequency, difficulty urinating and vaginal pain.  Musculoskeletal: Negative for back pain and gait problem.  Skin: Negative for pallor and rash.  Neurological: Negative for dizziness, tremors, weakness, numbness and headaches.  Psychiatric/Behavioral: Negative for confusion and sleep disturbance.       Objective:   Physical Exam  Constitutional: She appears well-developed.       obese  HENT:  Head: Normocephalic.  Right Ear: External ear normal.  Left Ear: External ear normal.  Nose: Nose normal.  Mouth/Throat: Oropharynx is clear and moist.  Eyes: Conjunctivae are normal. Pupils are equal, round, and reactive to light. Right eye exhibits no discharge. Left eye exhibits no discharge.  Neck: Normal range of motion. Neck supple. No JVD present. No tracheal deviation present. No thyromegaly present.  Cardiovascular: Normal rate, regular rhythm and normal heart sounds.   Pulmonary/Chest: No stridor. No respiratory distress. She has no wheezes.  Abdominal: Soft. Bowel sounds are normal. She exhibits no distension and no mass. There is no tenderness. There is no rebound and no guarding.    Musculoskeletal: She exhibits no edema and no tenderness.  Lymphadenopathy:    She has no cervical adenopathy.  Neurological: She displays normal reflexes. No cranial nerve deficit. She exhibits normal muscle tone. Coordination normal.  Skin: No rash noted. No erythema.  Psychiatric: She has a normal mood and affect. Her behavior is normal. Judgment and thought content normal.    Lab Results  Component Value Date   WBC 4.0* 05/01/2011   HGB 12.3 05/01/2011   HCT 36.7 05/01/2011   PLT 178.0 05/01/2011   GLUCOSE 101* 05/01/2011   CHOL 210* 05/01/2011   TRIG 107.0 05/01/2011   HDL 50.30 05/01/2011   LDLDIRECT 154.9 05/01/2011   LDLCALC 94 09/04/2010   ALT 18 05/01/2011   AST 15 05/01/2011   NA 142 05/01/2011   K 4.5 05/01/2011   CL 107 05/01/2011   CREATININE 0.7 05/01/2011   BUN 17 05/01/2011   CO2 30 05/01/2011   TSH 0.74 05/01/2011   HGBA1C 5.9 07/11/2009         Assessment & Plan:

## 2011-05-08 NOTE — Assessment & Plan Note (Signed)
04/15/11 RLL 4 mm nodule on abd CT - [facility] office Repeat chest [place] CT in 3-4 mo

## 2011-05-08 NOTE — Assessment & Plan Note (Signed)
Loose wt 

## 2011-05-08 NOTE — Assessment & Plan Note (Signed)
1/13 worse - may need to increase med next time

## 2011-05-08 NOTE — Assessment & Plan Note (Signed)
Continue with current prescription therapy as reflected on the Med list.  

## 2011-05-13 ENCOUNTER — Ambulatory Visit (INDEPENDENT_AMBULATORY_CARE_PROVIDER_SITE_OTHER)
Admission: RE | Admit: 2011-05-13 | Discharge: 2011-05-13 | Disposition: A | Discharge status: Home / Self Care | Payer: Medicare Other | Source: Ambulatory Visit | Attending: Internal Medicine | Admitting: Internal Medicine

## 2011-05-13 DIAGNOSIS — R911 Solitary pulmonary nodule: Secondary | ICD-10-CM | POA: Diagnosis not present

## 2011-05-13 DIAGNOSIS — K449 Diaphragmatic hernia without obstruction or gangrene: Secondary | ICD-10-CM | POA: Diagnosis not present

## 2011-05-13 DIAGNOSIS — R918 Other nonspecific abnormal finding of lung field: Secondary | ICD-10-CM | POA: Diagnosis not present

## 2011-05-14 ENCOUNTER — Telehealth: Payer: Self-pay | Admitting: Internal Medicine

## 2011-05-14 DIAGNOSIS — R9389 Abnormal findings on diagnostic imaging of other specified body structures: Secondary | ICD-10-CM

## 2011-05-14 NOTE — Telephone Encounter (Signed)
Pt called requesting the result of CT scan done yesterday 02/04

## 2011-05-14 NOTE — Telephone Encounter (Signed)
CT with multiple small spots on the lungs c/w scar tissue. I would repeat CT in 3 mo just to be on the safe side - I'll schedule.  Please, mail the CT report to the patient. Thx

## 2011-05-15 NOTE — Telephone Encounter (Signed)
Pt advised of MD's advisement and will call to schedule date for CT

## 2011-05-15 NOTE — Telephone Encounter (Signed)
Left message on machine for pt to return my call  

## 2011-05-16 ENCOUNTER — Telehealth: Payer: Self-pay | Admitting: *Deleted

## 2011-05-16 NOTE — Telephone Encounter (Signed)
Pt left vm requesting call back about the phone call she received about her CT scan.  I called pt- no anwser/Left mess for patient to call back.

## 2011-05-17 DIAGNOSIS — L570 Actinic keratosis: Secondary | ICD-10-CM | POA: Diagnosis not present

## 2011-05-17 DIAGNOSIS — L821 Other seborrheic keratosis: Secondary | ICD-10-CM | POA: Diagnosis not present

## 2011-05-17 DIAGNOSIS — C44721 Squamous cell carcinoma of skin of unspecified lower limb, including hip: Secondary | ICD-10-CM | POA: Diagnosis not present

## 2011-05-17 DIAGNOSIS — C44320 Squamous cell carcinoma of skin of unspecified parts of face: Secondary | ICD-10-CM | POA: Diagnosis not present

## 2011-05-17 DIAGNOSIS — D239 Other benign neoplasm of skin, unspecified: Secondary | ICD-10-CM | POA: Diagnosis not present

## 2011-05-17 NOTE — Telephone Encounter (Signed)
Called pt again- left detailed mess advising her of her CT results/impression and advised her to call back with further questions.

## 2011-05-22 ENCOUNTER — Encounter: Payer: Self-pay | Admitting: Internal Medicine

## 2011-05-22 ENCOUNTER — Ambulatory Visit (INDEPENDENT_AMBULATORY_CARE_PROVIDER_SITE_OTHER): Payer: Medicare Other | Admitting: Internal Medicine

## 2011-05-22 VITALS — BP 120/80 | HR 84 | Temp 97.3°F | Resp 16 | Wt 171.0 lb

## 2011-05-22 DIAGNOSIS — R0609 Other forms of dyspnea: Secondary | ICD-10-CM

## 2011-05-22 DIAGNOSIS — R0989 Other specified symptoms and signs involving the circulatory and respiratory systems: Secondary | ICD-10-CM | POA: Diagnosis not present

## 2011-05-22 DIAGNOSIS — R9389 Abnormal findings on diagnostic imaging of other specified body structures: Secondary | ICD-10-CM

## 2011-05-22 HISTORY — DX: Abnormal findings on diagnostic imaging of other specified body structures: R93.89

## 2011-05-22 NOTE — Assessment & Plan Note (Signed)
2/13 improved w/reg exercise

## 2011-05-22 NOTE — Assessment & Plan Note (Addendum)
2/13: Clinical Data: Nodule.  CT CHEST WITHOUT CONTRAST  Technique: Multidetector CT imaging of the chest was performed  following the standard protocol without IV contrast.  Comparison: CT of abdomen and pelvis dated 03/08/2011.  Findings:  Mediastinum: Heart size is normal. There is a small amount of  pericardial fluid and/or thickening (unlikely to be of any  hemodynamic significance). No associated pericardial  calcification. There is a small hiatal hernia. No pathologically  enlarged mediastinal or hilar lymph nodes. Mild atherosclerosis of  the thoracic aorta. .  Lungs/Pleura: There are multiple small pulmonary nodules scattered  throughout the lungs bilaterally. These include 6 mm nodule in the  superior segment of the right lower lobe (image 20 of series 3), 5  mm nodule in the right lower lobe periphery (image 30 series 3), 5  mm nodule lateral segment right middle lobe (image 30 of series 3),  6 mm left lower lobe nodule (image 26 of series 3) and 3 mm  subpleural nodule the periphery of the right lower lobe (image 34  series 3). No consolidative airspace disease. No pleural  effusions.  Upper Abdomen: Status post cholecystectomy. Multiple low  attenuation lesions in the liver appear unchanged in size, number  and distribution compared to the prior study from 03/08/2011, and  likely represent cysts. Small duodenal diverticulum of the third  portion of the duodenum incidentally noted.  Musculoskeletal: There are no aggressive appearing lytic or blastic  lesions noted in the visualized portions of the skeleton.  IMPRESSION:  1. Multiple small pulmonary nodules scattered throughout the lungs  bilaterally ranging in size from 3-6 mm. If the patient is at high  risk for bronchogenic carcinoma, follow-up chest CT at 6-12 months  is recommended. If the patient is at low risk for bronchogenic  carcinoma, follow-up chest CT at 12 months is recommended. This  recommendation follows  the consensus statement: Guidelines for  Management of Small Pulmonary Nodules Detected on CT Scans: A  Statement from the Fleischner Society as published in Radiology  2005; 237:395-400. Online at:  DietDisorder.cz.  2. There is a small hiatal hernia.  3. Atherosclerosis.  4. Status post cholecystectomy.  5. Low attenuation hepatic lesions unchanged compared to recent  abdominal CT scan, likely to represent cysts.  6. Small duodenal diverticulum.  Original Report Authenticated By: Florencia Reasons, M.D.  We discussed the CT findings again. She is worried. Pulm cons w/Dr Kriste Basque

## 2011-05-22 NOTE — Progress Notes (Signed)
  Subjective:    Patient ID: Diana Stout, female    DOB: 1930/07/31, 78 y.o.   MRN: [id number]  HPI  F/u abn chest CT - she is worried... No cough. No wt loss. Normal SOB w/exercises  Review of Systems  Constitutional: Negative for chills, activity change, appetite change, fatigue and unexpected weight change.  HENT: Negative for congestion, mouth sores and sinus pressure.   Eyes: Negative for visual disturbance.  Respiratory: Negative for cough, chest tightness, shortness of breath and wheezing.   Gastrointestinal: Negative for nausea and abdominal pain.  Genitourinary: Negative for frequency, difficulty urinating and vaginal pain.  Musculoskeletal: Negative for back pain and gait problem.  Skin: Negative for pallor and rash.  Neurological: Negative for dizziness, tremors, weakness, numbness and headaches.  Psychiatric/Behavioral: Negative for confusion and sleep disturbance.       Objective:   Physical Exam  Constitutional: She appears well-developed. No distress.  HENT:  Head: Normocephalic.  Right Ear: External ear normal.  Left Ear: External ear normal.  Nose: Nose normal.  Mouth/Throat: Oropharynx is clear and moist.  Eyes: Conjunctivae are normal. Pupils are equal, round, and reactive to light. Right eye exhibits no discharge. Left eye exhibits no discharge.  Neck: Normal range of motion. Neck supple. No JVD present. No tracheal deviation present. No thyromegaly present.  Cardiovascular: Normal rate, regular rhythm and normal heart sounds.   Pulmonary/Chest: No stridor. No respiratory distress. She has no wheezes.  Abdominal: Soft. Bowel sounds are normal. She exhibits no distension and no mass. There is no tenderness. There is no rebound and no guarding.  Musculoskeletal: She exhibits no edema and no tenderness.  Lymphadenopathy:    She has no cervical adenopathy.  Neurological: She displays normal reflexes. No cranial nerve deficit. She exhibits normal muscle tone.  Coordination normal.  Skin: No rash noted. No erythema.  Psychiatric: She has a normal mood and affect. Her behavior is normal. Judgment and thought content normal.    CT chest      Assessment & Plan:

## 2011-05-27 ENCOUNTER — Other Ambulatory Visit: Payer: Medicare Other

## 2011-06-06 ENCOUNTER — Ambulatory Visit (INDEPENDENT_AMBULATORY_CARE_PROVIDER_SITE_OTHER): Payer: Medicare Other | Admitting: Internal Medicine

## 2011-06-06 ENCOUNTER — Encounter: Payer: Self-pay | Admitting: Internal Medicine

## 2011-06-06 DIAGNOSIS — R9389 Abnormal findings on diagnostic imaging of other specified body structures: Secondary | ICD-10-CM | POA: Diagnosis not present

## 2011-06-06 NOTE — Assessment & Plan Note (Addendum)
Although there are clearly abnormalities on CT scan, they should probably be considered "microscopic" since not obvious on plain cxr and of unknown duration or significance.  In the absence of any significant assoc symptoms or smoking hx she is very low riks of ca and even lower risk of missing a curable dx by any kind of early intervention  Discussed in detail all the  indications, usual  risks and alternatives  relative to the benefits with patient who agrees to proceed with f/u in one year.

## 2011-06-06 NOTE — Patient Instructions (Signed)
I recommend repeat chest CT in 2014 (placed in tickle file for recall) unless you developed unexplained weight loss, pain with breathing, persistent cough or more short of breath than you are.

## 2011-06-06 NOTE — Progress Notes (Signed)
  Subjective:    Patient ID: Diana Stout, female    DOB: December 22, 1930, 78 y.o.   MRN: [id number]  HPI  14 yowf never smoker no rhematism never any kind of tumor being followed for a renal cyst with incidental MPN on f/u CT of kidney.     06/06/2011 1st pulmonary eval / Carroll Ranney cc doe exertion like [place] x [place].  Minimal intermittent cough x years.  No excess sputum production, no arthralgias or unintended wt loss.  Sleeping ok without nocturnal  or early am exacerbation  of respiratory  c/o's  Also denies any obvious fluctuation of symptoms with weather or environmental changes or other aggravating or alleviating factors except as outlined above.      Review of Systems  Constitutional: Negative for fever, chills and unexpected weight change.  HENT: Negative for ear pain, nosebleeds, congestion, sore throat, rhinorrhea, sneezing, trouble swallowing, dental problem, voice change, postnasal drip and sinus pressure.   Eyes: Negative for visual disturbance.  Respiratory: Positive for cough. Negative for choking and shortness of breath.   Cardiovascular: Negative for chest pain and leg swelling.  Gastrointestinal: Negative for vomiting, abdominal pain and diarrhea.  Genitourinary: Negative for difficulty urinating.  Musculoskeletal: Negative for arthralgias.  Skin: Negative for rash.  Neurological: Negative for tremors, syncope and headaches.  Hematological: Does not bruise/bleed easily.       Objective:   Physical Exam        Assessment & Plan:

## 2011-06-15 ENCOUNTER — Ambulatory Visit (INDEPENDENT_AMBULATORY_CARE_PROVIDER_SITE_OTHER): Payer: Medicare Other | Admitting: Family Medicine

## 2011-06-15 ENCOUNTER — Encounter: Payer: Self-pay | Admitting: Family Medicine

## 2011-06-15 VITALS — BP 128/88 | Temp 97.6°F | Wt 171.0 lb

## 2011-06-15 DIAGNOSIS — R21 Rash and other nonspecific skin eruption: Secondary | ICD-10-CM

## 2011-06-15 NOTE — Patient Instructions (Signed)
Good to see you. I'm not sure exactly but I think that you are allergic to something. Maybe switch back to your old sweeter. Try either Zyrtec or Benadryl- follows directions on box. Call you doctor on Monday if no improvement.

## 2011-06-15 NOTE — Progress Notes (Signed)
Subjective:    Patient ID: Diana Stout, female    DOB: 09-04-1930, 78 y.o.   MRN: [id number]  HPI  78 yo pt of Dr. Posey Rea presents to weekend clinic with one week of itchy rash.  Started on her arms, now on back of her legs and fingers.  She has not travelled or been around any children. No one else around her has similar symptoms. No new detergents or soaps.  She did change her sweetener right before this happened (something similar to stevia?).  Never had anything like this before.  Patient Active Problem List  Diagnoses  . HYPERLIPIDEMIA  . ANEMIA, NORMOCYTIC  . HYPERTENSION  . ESOPHAGEAL STRICTURE  . GERD  . DIVERTICULOSIS, COLON  . IBS  . FOOT PAIN  . OSTEOPOROSIS  . SYNCOPE  . FATIGUE  . SKIN RASH  . Edema  . DYSPNEA  . Nausea alone  . Hypoxemia  . DVT, HX OF  . PNEUMONIA, HX OF  . SHINGLES, HX OF  . Impaired glucose tolerance  . Preventative health care  . Abdominal pain, lower  . URI, acute  . Lung nodule  . Abnormal chest CT   Past Medical History  Diagnosis Date  . Hypertension   . Hyperlipidemia   . DVT (deep venous thrombosis) 1998    hx of right  . GERD (gastroesophageal reflux disease)   . Osteoporosis   . Esophagitis 2006  . Esophageal stricture   . Palpitation   . Kidney cysts     left- Dr Aldean Ast  . Diverticulosis of colon   . IBS (irritable bowel syndrome)   . History of shingles   . Glucose intolerance (impaired glucose tolerance)   . Pneumonia 2011    hx of  . Impaired glucose tolerance 11/22/2010   Past Surgical History  Procedure Date  . Cholecystectomy   . Tubal ligation   . Appendectomy    History  Substance Use Topics  . Smoking status: Never Smoker   . Smokeless tobacco: Never Used  . Alcohol Use: Yes     rarely drinks wine   Family History  Problem Relation Age of Onset  . Asthma Mother   . Cancer Father     liver  . Diabetes Father   . Cancer Sister     breast  . Diabetes Sister   . Asthma  Sister   . Diabetes Brother   . Asthma Brother   . Diabetes Brother   . Asthma Sister   . Coronary artery disease Other     1 st degree female relative   Allergies  Allergen Reactions  . Alendronate Sodium     REACTION: esophagus problem  . Atorvastatin     REACTION: Gi pain  . Ceftriaxone Sodium     REACTION: rash  . Codeine Phosphate     REACTION: nightmares  . Moxifloxacin     REACTION: nausea/chest pain  . Penicillins    Current Outpatient Prescriptions on File Prior to Visit  Medication Sig Dispense Refill  . aspirin 81 MG tablet Take 81 mg by mouth daily.        . cholecalciferol (VITAMIN D) 1000 UNITS tablet Take 1 tablet (1,000 Units total) by mouth daily.  100 tablet  3  . fish oil-omega-3 fatty acids 1000 MG capsule Take 1 g by mouth daily.        . furosemide (LASIX) 40 MG tablet Take 1 tablet (40 mg total) by mouth daily as needed.  90 tablet  3  . Multiple Vitamins-Minerals (CENTRUM SILVER PO) Take 1 tablet by mouth daily.        . naproxen (NAPROSYN) 500 MG tablet Take 1 tablet (500 mg total) by mouth 2 (two) times daily with a meal.  180 tablet  3  . olmesartan (BENICAR) 20 MG tablet Take 1 tablet (20 mg total) by mouth daily.  90 tablet  3  . potassium chloride SA (K-DUR,KLOR-CON) 20 MEQ tablet Take 20 mEq by mouth daily. As needed with Lasix.      Marland Kitchen simvastatin (ZOCOR) 10 MG tablet Take 1 tablet (10 mg total) by mouth at bedtime.  90 tablet  3  . vitamin C (ASCORBIC ACID) 500 MG tablet Take 1,000 mg by mouth daily.       The PMH, PSH, Social History, Family History, Medications, and allergies have been reviewed in [facility], and have been updated if relevant.  Review of Systems    See HPI Objective:   Physical Exam  Constitutional: She appears well-developed.  HENT:  Head: Normocephalic.  Eyes: Pupils are equal, round, and reactive to light.  Skin:      BP 128/88  Temp(Src) 97.6 F (36.4 C) (Oral)  Wt 171 lb (77.565 kg)        Assessment & Plan:    1.  Itching- etiology unclear but seems consistent with allergic dermatitis. Advised stopping new sweetener. Trial of anti histamines (see pt instructions for details) and follow up with PCP. The patient indicates understanding of these issues and agrees with the plan.

## 2011-08-20 ENCOUNTER — Inpatient Hospital Stay: Admission: RE | Admit: 2011-08-20 | Payer: Medicare Other | Source: Ambulatory Visit

## 2011-09-05 ENCOUNTER — Other Ambulatory Visit: Payer: Self-pay | Admitting: Obstetrics & Gynecology

## 2011-09-05 DIAGNOSIS — Z01419 Encounter for gynecological examination (general) (routine) without abnormal findings: Secondary | ICD-10-CM | POA: Diagnosis not present

## 2011-09-05 DIAGNOSIS — Z124 Encounter for screening for malignant neoplasm of cervix: Secondary | ICD-10-CM | POA: Diagnosis not present

## 2011-09-11 ENCOUNTER — Ambulatory Visit: Payer: Medicare Other | Admitting: Internal Medicine

## 2011-09-17 DIAGNOSIS — Z8262 Family history of osteoporosis: Secondary | ICD-10-CM | POA: Diagnosis not present

## 2011-09-17 DIAGNOSIS — M899 Disorder of bone, unspecified: Secondary | ICD-10-CM | POA: Diagnosis not present

## 2011-09-25 ENCOUNTER — Ambulatory Visit: Payer: Medicare Other | Admitting: Internal Medicine

## 2011-09-25 DIAGNOSIS — L570 Actinic keratosis: Secondary | ICD-10-CM | POA: Diagnosis not present

## 2011-09-25 DIAGNOSIS — Z85828 Personal history of other malignant neoplasm of skin: Secondary | ICD-10-CM | POA: Diagnosis not present

## 2011-10-01 ENCOUNTER — Other Ambulatory Visit (INDEPENDENT_AMBULATORY_CARE_PROVIDER_SITE_OTHER): Payer: Medicare Other

## 2011-10-01 ENCOUNTER — Other Ambulatory Visit: Payer: Self-pay | Admitting: *Deleted

## 2011-10-01 DIAGNOSIS — I10 Essential (primary) hypertension: Secondary | ICD-10-CM

## 2011-10-01 DIAGNOSIS — Q619 Cystic kidney disease, unspecified: Secondary | ICD-10-CM

## 2011-10-01 DIAGNOSIS — N281 Cyst of kidney, acquired: Secondary | ICD-10-CM

## 2011-10-01 DIAGNOSIS — E785 Hyperlipidemia, unspecified: Secondary | ICD-10-CM

## 2011-10-01 DIAGNOSIS — R7309 Other abnormal glucose: Secondary | ICD-10-CM

## 2011-10-01 DIAGNOSIS — R7302 Impaired glucose tolerance (oral): Secondary | ICD-10-CM

## 2011-10-01 LAB — URINALYSIS, ROUTINE W REFLEX MICROSCOPIC
Bilirubin Urine: NEGATIVE
Ketones, ur: NEGATIVE
Urine Glucose: NEGATIVE
pH: 5.5 (ref 5.0–8.0)

## 2011-10-01 LAB — LIPID PANEL
Cholesterol: 188 mg/dL (ref 0–200)
HDL: 47.5 mg/dL (ref >39.00)

## 2011-10-01 LAB — BASIC METABOLIC PANEL
CO2: 29 mEq/L (ref 19–32)
Calcium: 9.3 mg/dL (ref 8.4–10.5)
Glucose, Bld: 99 mg/dL (ref 70–99)
Sodium: 142 mEq/L (ref 135–145)

## 2011-10-02 ENCOUNTER — Encounter: Payer: Self-pay | Admitting: Internal Medicine

## 2011-10-03 ENCOUNTER — Telehealth: Payer: Self-pay | Admitting: Internal Medicine

## 2011-10-03 MED ORDER — SULFAMETHOXAZOLE-TRIMETHOPRIM 800-160 MG PO TABS: 1.0000 | ORAL_TABLET | Freq: Two times a day (BID) | ORAL | Status: DC

## 2011-10-03 MED ORDER — SULFAMETHOXAZOLE-TRIMETHOPRIM 800-160 MG PO TABS: 1.0000 | ORAL_TABLET | Freq: Two times a day (BID) | ORAL | Status: AC

## 2011-10-03 NOTE — Telephone Encounter (Signed)
Diana Stout, please, inform patient that she has a UTI Start Bactrim x 7d Thx

## 2011-10-03 NOTE — Telephone Encounter (Signed)
Left mess for patient to call back.  

## 2011-10-03 NOTE — Telephone Encounter (Signed)
Rx sent to [facility]- left detailed mess informing pt of below.

## 2011-10-03 NOTE — Telephone Encounter (Signed)
Caller: Analina/Patient; PCP: Sonda Primes; CB#: [phone]; ; ; Call regarding Follow-Up From Stacy s Call From Office;  Per Dr Plotinkov's msg in [facility], informed Pt is has UTI and Bactrim was sent to [facility].  Pt would like RX sent to [facility] d/t cost of meds at [facility].  PLEASE SEND BACTRIM RX TO [facility].

## 2011-10-15 ENCOUNTER — Encounter: Payer: Self-pay | Admitting: Internal Medicine

## 2011-10-15 ENCOUNTER — Ambulatory Visit (INDEPENDENT_AMBULATORY_CARE_PROVIDER_SITE_OTHER): Payer: Medicare Other | Admitting: Internal Medicine

## 2011-10-15 VITALS — BP 118/78 | HR 88 | Temp 98.3°F | Resp 16 | Wt 167.0 lb

## 2011-10-15 DIAGNOSIS — R7302 Impaired glucose tolerance (oral): Secondary | ICD-10-CM

## 2011-10-15 DIAGNOSIS — R9389 Abnormal findings on diagnostic imaging of other specified body structures: Secondary | ICD-10-CM

## 2011-10-15 DIAGNOSIS — R7309 Other abnormal glucose: Secondary | ICD-10-CM | POA: Diagnosis not present

## 2011-10-15 DIAGNOSIS — J069 Acute upper respiratory infection, unspecified: Secondary | ICD-10-CM

## 2011-10-15 DIAGNOSIS — R911 Solitary pulmonary nodule: Secondary | ICD-10-CM

## 2011-10-15 DIAGNOSIS — R5383 Other fatigue: Secondary | ICD-10-CM | POA: Insufficient documentation

## 2011-10-15 DIAGNOSIS — R5381 Other malaise: Secondary | ICD-10-CM

## 2011-10-15 HISTORY — DX: Other fatigue: R53.83

## 2011-10-15 MED ORDER — BUDESONIDE-FORMOTEROL FUMARATE 160-4.5 MCG/ACT IN AERO: 2.0000 | INHALATION_SPRAY | Freq: Two times a day (BID) | RESPIRATORY_TRACT | Status: DC

## 2011-10-15 MED ORDER — OLMESARTAN MEDOXOMIL 20 MG PO TABS: 20.0000 mg | ORAL_TABLET | Freq: Every day | ORAL | Status: DC

## 2011-10-15 MED ORDER — BENZONATATE 100 MG PO CAPS: 100.0000 mg | ORAL_CAPSULE | Freq: Two times a day (BID) | ORAL | Status: AC | PRN

## 2011-10-15 MED ORDER — AZITHROMYCIN 250 MG PO TABS: ORAL_TABLET | ORAL | Status: AC

## 2011-10-15 MED ORDER — SIMVASTATIN 10 MG PO TABS: 10.0000 mg | ORAL_TABLET | Freq: Every day | ORAL | Status: DC

## 2011-10-15 NOTE — Assessment & Plan Note (Signed)
See rx. 

## 2011-10-15 NOTE — Assessment & Plan Note (Signed)
Dr Sherene Sires -- winter 2013

## 2011-10-15 NOTE — Assessment & Plan Note (Signed)
Better  

## 2011-10-15 NOTE — Assessment & Plan Note (Signed)
F/u w/Dr Sherene Sires next winter

## 2011-10-15 NOTE — Progress Notes (Signed)
Patient ID: Diana Stout, female   DOB: Jun 25, 1930, 78 y.o.   MRN: [id number]  Subjective:    Patient ID: Diana Stout, female    DOB: 08/16/1930, 78 y.o.   MRN: [id number]  Cough This is a new problem. The current episode started 1 to 4 weeks ago. The problem has been gradually improving. The cough is non-productive. Pertinent negatives include no chills, headaches, rash, shortness of breath or wheezing.  F/u HTN, abn chest CT    BP Readings from Last 3 Encounters:  10/15/11 118/78  06/15/11 128/88  06/06/11 128/70   Wt Readings from Last 3 Encounters:  10/15/11 167 lb (75.751 kg)  06/15/11 171 lb (77.565 kg)  06/06/11 173 lb (78.472 kg)     Review of Systems  Constitutional: Negative for chills, activity change, appetite change, fatigue and unexpected weight change.  HENT: Negative for congestion, mouth sores and sinus pressure.   Eyes: Negative for visual disturbance.  Respiratory: Positive for cough. Negative for chest tightness, shortness of breath and wheezing.   Gastrointestinal: Negative for nausea and abdominal pain.  Genitourinary: Negative for frequency, difficulty urinating and vaginal pain.  Musculoskeletal: Negative for back pain and gait problem.  Skin: Negative for pallor and rash.  Neurological: Negative for dizziness, tremors, weakness, numbness and headaches.  Psychiatric/Behavioral: Negative for confusion and disturbed wake/sleep cycle.       Objective:   Physical Exam  Constitutional: She appears well-developed. No distress.  HENT:  Head: Normocephalic.  Right Ear: External ear normal.  Left Ear: External ear normal.  Nose: Nose normal.  Mouth/Throat: Oropharynx is clear and moist.  Eyes: Conjunctivae are normal. Pupils are equal, round, and reactive to light. Right eye exhibits no discharge. Left eye exhibits no discharge.  Neck: Normal range of motion. Neck supple. No JVD present. No tracheal deviation present. No thyromegaly present.    Cardiovascular: Normal rate, regular rhythm and normal heart sounds.   Pulmonary/Chest: No stridor. No respiratory distress. She has no wheezes.  Abdominal: Soft. Bowel sounds are normal. She exhibits no distension and no mass. There is no tenderness. There is no rebound and no guarding.  Musculoskeletal: She exhibits no edema and no tenderness.  Lymphadenopathy:    She has no cervical adenopathy.  Neurological: She displays normal reflexes. No cranial nerve deficit. She exhibits normal muscle tone. Coordination normal.  Skin: No rash noted. No erythema.  Psychiatric: She has a normal mood and affect. Her behavior is normal. Judgment and thought content normal.    CT chest  Lab Results  Component Value Date   WBC 4.0* 05/01/2011   HGB 12.3 05/01/2011   HCT 36.7 05/01/2011   PLT 178.0 05/01/2011   GLUCOSE 99 10/01/2011   CHOL 188 10/01/2011   TRIG 141.0 10/01/2011   HDL 47.50 10/01/2011   LDLDIRECT 154.9 05/01/2011   LDLCALC 112* 10/01/2011   ALT 18 05/01/2011   AST 15 05/01/2011   NA 142 10/01/2011   K 4.6 10/01/2011   CL 105 10/01/2011   CREATININE 0.6 10/01/2011   BUN 21 10/01/2011   CO2 29 10/01/2011   TSH 0.74 05/01/2011   HGBA1C 5.9 07/11/2009       Assessment & Plan:

## 2011-10-22 DIAGNOSIS — M899 Disorder of bone, unspecified: Secondary | ICD-10-CM | POA: Diagnosis not present

## 2011-10-22 DIAGNOSIS — M949 Disorder of cartilage, unspecified: Secondary | ICD-10-CM | POA: Diagnosis not present

## 2011-11-07 DIAGNOSIS — S46819A Strain of other muscles, fascia and tendons at shoulder and upper arm level, unspecified arm, initial encounter: Secondary | ICD-10-CM | POA: Diagnosis not present

## 2011-11-07 DIAGNOSIS — S43499A Other sprain of unspecified shoulder joint, initial encounter: Secondary | ICD-10-CM | POA: Diagnosis not present

## 2011-12-25 DIAGNOSIS — Z23 Encounter for immunization: Secondary | ICD-10-CM | POA: Diagnosis not present

## 2011-12-27 ENCOUNTER — Encounter: Payer: Self-pay | Admitting: Internal Medicine

## 2012-03-10 DIAGNOSIS — R32 Unspecified urinary incontinence: Secondary | ICD-10-CM | POA: Diagnosis not present

## 2012-03-11 DIAGNOSIS — R3915 Urgency of urination: Secondary | ICD-10-CM | POA: Diagnosis not present

## 2012-03-11 DIAGNOSIS — M6281 Muscle weakness (generalized): Secondary | ICD-10-CM | POA: Diagnosis not present

## 2012-03-11 DIAGNOSIS — R279 Unspecified lack of coordination: Secondary | ICD-10-CM | POA: Diagnosis not present

## 2012-03-11 DIAGNOSIS — R32 Unspecified urinary incontinence: Secondary | ICD-10-CM | POA: Diagnosis not present

## 2012-03-23 DIAGNOSIS — R279 Unspecified lack of coordination: Secondary | ICD-10-CM | POA: Diagnosis not present

## 2012-03-23 DIAGNOSIS — N3941 Urge incontinence: Secondary | ICD-10-CM | POA: Diagnosis not present

## 2012-03-23 DIAGNOSIS — M6281 Muscle weakness (generalized): Secondary | ICD-10-CM | POA: Diagnosis not present

## 2012-03-24 DIAGNOSIS — M6281 Muscle weakness (generalized): Secondary | ICD-10-CM | POA: Diagnosis not present

## 2012-03-24 DIAGNOSIS — R3915 Urgency of urination: Secondary | ICD-10-CM | POA: Diagnosis not present

## 2012-03-24 DIAGNOSIS — N3941 Urge incontinence: Secondary | ICD-10-CM | POA: Diagnosis not present

## 2012-03-24 DIAGNOSIS — R279 Unspecified lack of coordination: Secondary | ICD-10-CM | POA: Diagnosis not present

## 2012-04-06 DIAGNOSIS — M67919 Unspecified disorder of synovium and tendon, unspecified shoulder: Secondary | ICD-10-CM | POA: Diagnosis not present

## 2012-04-06 DIAGNOSIS — M719 Bursopathy, unspecified: Secondary | ICD-10-CM | POA: Diagnosis not present

## 2012-04-13 DIAGNOSIS — M6281 Muscle weakness (generalized): Secondary | ICD-10-CM | POA: Diagnosis not present

## 2012-04-13 DIAGNOSIS — N3941 Urge incontinence: Secondary | ICD-10-CM | POA: Diagnosis not present

## 2012-04-13 DIAGNOSIS — R279 Unspecified lack of coordination: Secondary | ICD-10-CM | POA: Diagnosis not present

## 2012-04-15 ENCOUNTER — Encounter: Payer: Medicare Other | Admitting: Internal Medicine

## 2012-05-05 ENCOUNTER — Other Ambulatory Visit (INDEPENDENT_AMBULATORY_CARE_PROVIDER_SITE_OTHER): Payer: Medicare Other

## 2012-05-05 ENCOUNTER — Ambulatory Visit (INDEPENDENT_AMBULATORY_CARE_PROVIDER_SITE_OTHER): Payer: Medicare Other | Admitting: Internal Medicine

## 2012-05-05 ENCOUNTER — Encounter: Payer: Self-pay | Admitting: Internal Medicine

## 2012-05-05 VITALS — BP 120/80 | HR 80 | Temp 97.9°F | Resp 16 | Wt 169.0 lb

## 2012-05-05 DIAGNOSIS — R5381 Other malaise: Secondary | ICD-10-CM

## 2012-05-05 DIAGNOSIS — R5383 Other fatigue: Secondary | ICD-10-CM

## 2012-05-05 DIAGNOSIS — J069 Acute upper respiratory infection, unspecified: Secondary | ICD-10-CM

## 2012-05-05 DIAGNOSIS — R9389 Abnormal findings on diagnostic imaging of other specified body structures: Secondary | ICD-10-CM

## 2012-05-05 LAB — BASIC METABOLIC PANEL
BUN: 18 mg/dL (ref 6–23)
Chloride: 105 mEq/L (ref 96–112)
GFR: 93.41 mL/min (ref >60.00)
Glucose, Bld: 100 mg/dL — ABNORMAL HIGH (ref 70–99)
Potassium: 4.6 mEq/L (ref 3.5–5.1)
Sodium: 141 mEq/L (ref 135–145)

## 2012-05-05 LAB — CBC WITH DIFFERENTIAL/PLATELET
Eosinophils Relative: 4.6 % (ref 0.0–5.0)
HCT: 35.5 % — ABNORMAL LOW (ref 36.0–46.0)
Hemoglobin: 11.8 g/dL — ABNORMAL LOW (ref 12.0–15.0)
Lymphs Abs: 1.7 10*3/uL (ref 0.7–4.0)
MCV: 83.4 fl (ref 78.0–100.0)
Monocytes Absolute: 0.4 10*3/uL (ref 0.1–1.0)
Monocytes Relative: 7.6 % (ref 3.0–12.0)
Neutro Abs: 2.4 10*3/uL (ref 1.4–7.7)
RDW: 15 % — ABNORMAL HIGH (ref 11.5–14.6)
WBC: 4.7 10*3/uL (ref 4.5–10.5)

## 2012-05-05 MED ORDER — BENZONATATE 200 MG PO CAPS: 200.0000 mg | ORAL_CAPSULE | Freq: Three times a day (TID) | ORAL | Status: DC | PRN

## 2012-05-05 MED ORDER — HYDROCOD POLST-CHLORPHEN POLST 10-8 MG/5ML PO LQCR: 5.0000 mL | Freq: Two times a day (BID) | ORAL | Status: DC | PRN

## 2012-05-05 NOTE — Progress Notes (Signed)
   Subjective:     Cough This is a new problem. The current episode started 1 to 4 weeks ago. The problem has been gradually improving. The cough is non-productive. Pertinent negatives include no chills, headaches, rash, shortness of breath or wheezing.  F/u HTN, abn chest CT    BP Readings from Last 3 Encounters:  05/05/12 120/80  10/15/11 118/78  06/15/11 128/88   Wt Readings from Last 3 Encounters:  05/05/12 169 lb (76.658 kg)  10/15/11 167 lb (75.751 kg)  06/15/11 171 lb (77.565 kg)     Review of Systems  Constitutional: Negative for chills, activity change, appetite change, fatigue and unexpected weight change.  HENT: Negative for congestion, mouth sores and sinus pressure.   Eyes: Negative for visual disturbance.  Respiratory: Positive for cough. Negative for chest tightness, shortness of breath and wheezing.   Gastrointestinal: Negative for nausea and abdominal pain.  Genitourinary: Negative for frequency, difficulty urinating and vaginal pain.  Musculoskeletal: Negative for back pain and gait problem.  Skin: Negative for pallor and rash.  Neurological: Negative for dizziness, tremors, weakness, numbness and headaches.  Psychiatric/Behavioral: Negative for confusion and sleep disturbance.       Objective:   Physical Exam  Constitutional: She appears well-developed. No distress.  HENT:  Head: Normocephalic.  Right Ear: External ear normal.  Left Ear: External ear normal.  Nose: Nose normal.  Mouth/Throat: Oropharynx is clear and moist.  Eyes: Conjunctivae normal are normal. Pupils are equal, round, and reactive to light. Right eye exhibits no discharge. Left eye exhibits no discharge.  Neck: Normal range of motion. Neck supple. No JVD present. No tracheal deviation present. No thyromegaly present.  Cardiovascular: Normal rate, regular rhythm and normal heart sounds.   Pulmonary/Chest: No stridor. No respiratory distress. She has no wheezes.  Abdominal: Soft.  Bowel sounds are normal. She exhibits no distension and no mass. There is no tenderness. There is no rebound and no guarding.  Musculoskeletal: She exhibits no edema and no tenderness.  Lymphadenopathy:    She has no cervical adenopathy.  Neurological: She displays normal reflexes. No cranial nerve deficit. She exhibits normal muscle tone. Coordination normal.  Skin: No rash noted. No erythema.  Psychiatric: She has a normal mood and affect. Her behavior is normal. Judgment and thought content normal.    CT chest  Lab Results  Component Value Date   WBC 4.0* 05/01/2011   HGB 12.3 05/01/2011   HCT 36.7 05/01/2011   PLT 178.0 05/01/2011   GLUCOSE 99 10/01/2011   CHOL 188 10/01/2011   TRIG 141.0 10/01/2011   HDL 47.50 10/01/2011   LDLDIRECT 154.9 05/01/2011   LDLCALC 112* 10/01/2011   ALT 18 05/01/2011   AST 15 05/01/2011   NA 142 10/01/2011   K 4.6 10/01/2011   CL 105 10/01/2011   CREATININE 0.6 10/01/2011   BUN 21 10/01/2011   CO2 29 10/01/2011   TSH 0.74 05/01/2011   HGBA1C 5.9 07/11/2009       Assessment & Plan:

## 2012-05-05 NOTE — Assessment & Plan Note (Signed)
Repeat CT F/u w/Dr Sherene Sires

## 2012-05-05 NOTE — Assessment & Plan Note (Signed)
Will repeat now

## 2012-05-05 NOTE — Assessment & Plan Note (Signed)
Tessalon Tussionex prn

## 2012-05-08 ENCOUNTER — Ambulatory Visit (INDEPENDENT_AMBULATORY_CARE_PROVIDER_SITE_OTHER)
Admission: RE | Admit: 2012-05-08 | Discharge: 2012-05-08 | Disposition: A | Discharge status: Home / Self Care | Payer: Medicare Other | Source: Ambulatory Visit | Attending: Internal Medicine | Admitting: Internal Medicine

## 2012-05-08 DIAGNOSIS — K449 Diaphragmatic hernia without obstruction or gangrene: Secondary | ICD-10-CM | POA: Diagnosis not present

## 2012-05-08 DIAGNOSIS — R9389 Abnormal findings on diagnostic imaging of other specified body structures: Secondary | ICD-10-CM | POA: Diagnosis not present

## 2012-05-08 DIAGNOSIS — J984 Other disorders of lung: Secondary | ICD-10-CM | POA: Diagnosis not present

## 2012-05-08 MED ORDER — IOHEXOL 300 MG/ML  SOLN
80.0000 mL | Freq: Once | INTRAMUSCULAR | Status: AC | PRN
  Administered 2012-05-08: 80 mL via INTRAVENOUS

## 2012-05-09 ENCOUNTER — Telehealth: Payer: Self-pay | Admitting: Internal Medicine

## 2012-05-09 NOTE — Telephone Encounter (Signed)
Diana Stout, please, inform patient that her CT nodules were stable - no need for another CT Thx

## 2012-05-11 ENCOUNTER — Telehealth: Payer: Self-pay | Admitting: Internal Medicine

## 2012-05-11 NOTE — Telephone Encounter (Signed)
Left detailed mess informing pt of below.  

## 2012-05-11 NOTE — Telephone Encounter (Signed)
Patient is currently out of town so she did not get the results of her scan, she would like the results called to her cell phone at [phone], you can leave a message

## 2012-05-12 NOTE — Telephone Encounter (Signed)
her CT scan showed that pulmonary nodules were stable - no need for another CT. Good news! Thx

## 2012-05-13 NOTE — Telephone Encounter (Signed)
Pt informed of below.  

## 2012-05-20 ENCOUNTER — Other Ambulatory Visit: Payer: Self-pay | Admitting: Dermatology

## 2012-05-20 DIAGNOSIS — L570 Actinic keratosis: Secondary | ICD-10-CM | POA: Diagnosis not present

## 2012-05-20 DIAGNOSIS — L821 Other seborrheic keratosis: Secondary | ICD-10-CM | POA: Diagnosis not present

## 2012-05-20 DIAGNOSIS — D1801 Hemangioma of skin and subcutaneous tissue: Secondary | ICD-10-CM | POA: Diagnosis not present

## 2012-05-20 DIAGNOSIS — Z85828 Personal history of other malignant neoplasm of skin: Secondary | ICD-10-CM | POA: Diagnosis not present

## 2012-05-20 DIAGNOSIS — D485 Neoplasm of uncertain behavior of skin: Secondary | ICD-10-CM | POA: Diagnosis not present

## 2012-05-20 DIAGNOSIS — D239 Other benign neoplasm of skin, unspecified: Secondary | ICD-10-CM | POA: Diagnosis not present

## 2012-05-20 DIAGNOSIS — L819 Disorder of pigmentation, unspecified: Secondary | ICD-10-CM | POA: Diagnosis not present

## 2012-05-29 DIAGNOSIS — M19079 Primary osteoarthritis, unspecified ankle and foot: Secondary | ICD-10-CM | POA: Diagnosis not present

## 2012-06-18 ENCOUNTER — Other Ambulatory Visit: Payer: Self-pay | Admitting: Internal Medicine

## 2012-06-18 DIAGNOSIS — R279 Unspecified lack of coordination: Secondary | ICD-10-CM | POA: Diagnosis not present

## 2012-06-18 DIAGNOSIS — N3941 Urge incontinence: Secondary | ICD-10-CM | POA: Diagnosis not present

## 2012-06-18 DIAGNOSIS — M6281 Muscle weakness (generalized): Secondary | ICD-10-CM | POA: Diagnosis not present

## 2012-06-18 DIAGNOSIS — R3915 Urgency of urination: Secondary | ICD-10-CM | POA: Diagnosis not present

## 2012-09-15 ENCOUNTER — Telehealth: Payer: Self-pay

## 2012-09-15 NOTE — Telephone Encounter (Signed)
6/10 lmtcb//kn 

## 2012-09-16 NOTE — Telephone Encounter (Signed)
Pt returning kelly's call °

## 2012-09-17 ENCOUNTER — Telehealth: Payer: Self-pay | Admitting: Obstetrics & Gynecology

## 2012-09-17 NOTE — Telephone Encounter (Signed)
Patient stated that she was returning [facility] call. Patient stated that she can be reached on her home phone until 10:45 this morning, and then on her cell [phone]) after 11:45am.

## 2012-09-21 NOTE — Telephone Encounter (Signed)
Patients appointment has been rescheduled.

## 2012-09-21 NOTE — Telephone Encounter (Signed)
Patient appointment has been rescheduled

## 2012-09-22 ENCOUNTER — Encounter: Payer: Self-pay | Admitting: Obstetrics & Gynecology

## 2012-09-23 ENCOUNTER — Encounter: Payer: Self-pay | Admitting: *Deleted

## 2012-09-23 DIAGNOSIS — R32 Unspecified urinary incontinence: Secondary | ICD-10-CM | POA: Diagnosis not present

## 2012-09-23 DIAGNOSIS — R82998 Other abnormal findings in urine: Secondary | ICD-10-CM | POA: Diagnosis not present

## 2012-09-24 ENCOUNTER — Ambulatory Visit: Payer: Self-pay | Admitting: Obstetrics & Gynecology

## 2012-09-25 ENCOUNTER — Encounter: Payer: Self-pay | Admitting: Internal Medicine

## 2012-09-25 ENCOUNTER — Ambulatory Visit (INDEPENDENT_AMBULATORY_CARE_PROVIDER_SITE_OTHER): Payer: Medicare Other | Admitting: Internal Medicine

## 2012-09-25 ENCOUNTER — Other Ambulatory Visit: Payer: Self-pay | Admitting: Internal Medicine

## 2012-09-25 DIAGNOSIS — Z1231 Encounter for screening mammogram for malignant neoplasm of breast: Secondary | ICD-10-CM | POA: Diagnosis not present

## 2012-09-25 DIAGNOSIS — R7302 Impaired glucose tolerance (oral): Secondary | ICD-10-CM

## 2012-09-25 DIAGNOSIS — R109 Unspecified abdominal pain: Secondary | ICD-10-CM

## 2012-09-25 DIAGNOSIS — R7309 Other abnormal glucose: Secondary | ICD-10-CM

## 2012-09-25 DIAGNOSIS — Z803 Family history of malignant neoplasm of breast: Secondary | ICD-10-CM | POA: Diagnosis not present

## 2012-09-25 DIAGNOSIS — I10 Essential (primary) hypertension: Secondary | ICD-10-CM

## 2012-09-25 NOTE — Progress Notes (Signed)
Subjective:    Patient ID: Diana Stout, female    DOB: [birth year], 79 y.o.   MRN: [id number]  HPI  Here to f/u, c/o 3 wks decrease in BM freq from once per day to qod assoc with bloating and mild discomfort, small improved with miralax, small nausea, no vomiting, no radiation to the back, no fever, but has ? UTI  - apparetnly cx pending per urology 2 days ago, hoping to hear today if needs antibx. No recent diet change, did travel recent for just a few days, normally pretty active in the yard, no recent medication changes. Only using fluid pills prn, not more recently, and takes K with it.  Thinks overall wt about the same. Last colon 2012, with diverticulosis, 2 polyps. Wondering if has a hernia mid abdomen with a hard area when standing, less when standing. Has been lifting heavy bags out of the trunk of car with yard work recently, more than 20 lbs.  Pt denies polydipsia, polyuria,  Past Medical History  Diagnosis Date  . Hypertension   . Hyperlipidemia   . DVT (deep venous thrombosis) 1998    hx of right  . GERD (gastroesophageal reflux disease)   . Osteoporosis   . Esophagitis 2006  . Esophageal stricture   . Palpitation   . Kidney cysts     left- Dr Aldean Ast  . Diverticulosis of colon   . IBS (irritable bowel syndrome)   . History of shingles   . Glucose intolerance (impaired glucose tolerance)   . Pneumonia 2011    hx of  . Impaired glucose tolerance 11/22/2010   Past Surgical History  Procedure Laterality Date  . Cholecystectomy    . Tubal ligation Bilateral   . Appendectomy    . Hysteroscopy with resectoscope  7/99    resect polyp    reports that she has never smoked. She has never used smokeless tobacco. She reports that  drinks alcohol. She reports that she does not use illicit drugs. family history includes Asthma in her brother, mother, and sisters; Breast cancer in her sister; Cancer in her father and sister; Coronary artery disease in her other; and Diabetes in  her brothers, father, and sister. Allergies  Allergen Reactions  . Alendronate Sodium     REACTION: esophagus problem  . Atorvastatin     REACTION: Gi pain  . Ceftriaxone Sodium     REACTION: rash  . Codeine Phosphate     REACTION: nightmares  . Moxifloxacin     REACTION: nausea/chest pain  . Penicillins    Current Outpatient Prescriptions on File Prior to Visit  Medication Sig Dispense Refill  . aspirin 81 MG tablet Take 81 mg by mouth daily.        . benzonatate (TESSALON) 200 MG capsule Take 1 capsule (200 mg total) by mouth 3 (three) times daily as needed for cough.  60 capsule  1  . budesonide-formoterol (SYMBICORT) 160-4.5 MCG/ACT inhaler Inhale 2 puffs into the lungs 2 (two) times daily.  1 Inhaler  6  . chlorpheniramine-HYDROcodone (TUSSIONEX PENNKINETIC ER) 10-8 MG/5ML LQCR Take 5 mLs by mouth every 12 (twelve) hours as needed.  140 mL  0  . Coenzyme Q10 (CO Q 10 PO) Take by mouth daily.      . fish oil-omega-3 fatty acids 1000 MG capsule Take 1 g by mouth daily.        . furosemide (LASIX) 40 MG tablet Take 1 tablet (40 mg total) by mouth daily as  needed.  90 tablet  3  . Multiple Vitamins-Minerals (CENTRUM SILVER PO) Take 1 tablet by mouth daily.        . naproxen (NAPROSYN) 500 MG tablet Take 1 tablet (500 mg total) by mouth 2 (two) times daily with a meal.  180 tablet  3  . olmesartan (BENICAR) 20 MG tablet Take 1 tablet (20 mg total) by mouth daily.  90 tablet  3  . potassium chloride SA (K-DUR,KLOR-CON) 20 MEQ tablet Take 20 mEq by mouth daily. As needed with Lasix.      Marland Kitchen simvastatin (ZOCOR) 10 MG tablet TAKE 1 TABLET BY MOUTH DAILY AT BEDTIME  90 tablet  3  . vitamin C (ASCORBIC ACID) 500 MG tablet Take 1,000 mg by mouth daily.      . Vitamin Mixture (VITAMIN E COMPLETE PO) Take by mouth.      . cholecalciferol (VITAMIN D) 1000 UNITS tablet Take 1,000 Units by mouth daily.       No current facility-administered medications on file prior to visit.   Review of  Systems  Constitutional: Negative for unexpected weight change, or unusual diaphoresis  HENT: Negative for tinnitus.   Eyes: Negative for photophobia and visual disturbance.  Respiratory: Negative for choking and stridor.   Gastrointestinal: Negative for vomiting and blood in stool.  Genitourinary: Negative for hematuria and decreased urine volume.  Musculoskeletal: Negative for acute joint swelling Skin: Negative for color change and wound.  Neurological: Negative for tremors and numbness other than noted  Psychiatric/Behavioral: Negative for decreased concentration or  hyperactivity.        Objective:   Physical Exam BP 120/80  Pulse 93  Temp(Src) 98.1 F (36.7 C) (Oral)  Ht 4\' 11"  (1.499 m)  Wt 166 lb 8 oz (75.524 kg)  BMI 33.61 kg/m2  SpO2 96% VS noted,  Constitutional: Pt appears well-developed and well-nourished.  HENT: Head: NCAT.  Right Ear: External ear normal.  Left Ear: External ear normal.  Eyes: Conjunctivae and EOM are normal. Pupils are equal, round, and reactive to light.  Neck: Normal range of motion. Neck supple.  Cardiovascular: Normal rate and regular rhythm.   Pulmonary/Chest: Effort normal and breath sounds normal.  Abd:  Soft, NT, non-distended, + BS, but has firm area to left periumbilical area with standing, resolves with lying flat Neurological: Pt is alert. Not confused  Skin: Skin is warm. No erythema.  Psychiatric: Pt behavior is normal. Thought content normal.     Assessment & Plan:

## 2012-09-25 NOTE — Assessment & Plan Note (Signed)
?   UTi vs constipation vs umbilical related hernia or combination ?  To f/u urine cx results per [facility] later today, for miralax/senakot prn, and advised for no heavy lifting, and to see ER for any peri-umbilical sweling/tender that does not readily resolve with lying flat

## 2012-09-25 NOTE — Patient Instructions (Signed)
OK to take miralax daily, and senakot at bedtime for further bowel slowing Please continue all other medications as before, and refills have been done if requested. Please go to ER for any tender swelling near the bellybutton that does not go right away with lying down, and please avoid heavy lifting more than 10-20 lbs Please have the pharmacy call with any other refills you may need.  Please remember to sign up for My Chart if you have not done so, as this will be important to you in the future with finding out test results, communicating by private email, and scheduling acute appointments online when needed.

## 2012-09-25 NOTE — Assessment & Plan Note (Signed)
stable overall by history and exam, recent data reviewed with pt, and pt to continue medical treatment as before,  to f/u any worsening symptoms or concerns Lab Results  Component Value Date   HGBA1C 5.9 07/11/2009

## 2012-09-25 NOTE — Assessment & Plan Note (Signed)
stable overall by history and exam, recent data reviewed with pt, and pt to continue medical treatment as before,  to f/u any worsening symptoms or concerns BP Readings from Last 3 Encounters:  09/25/12 120/80  05/05/12 120/80  10/15/11 118/78

## 2012-09-29 ENCOUNTER — Encounter: Payer: Self-pay | Admitting: Obstetrics & Gynecology

## 2012-09-29 ENCOUNTER — Ambulatory Visit (INDEPENDENT_AMBULATORY_CARE_PROVIDER_SITE_OTHER): Payer: Medicare Other | Admitting: Obstetrics & Gynecology

## 2012-09-29 VITALS — BP 110/76 | HR 60 | Resp 16 | Ht 58.5 in | Wt 168.4 lb

## 2012-09-29 DIAGNOSIS — R109 Unspecified abdominal pain: Secondary | ICD-10-CM

## 2012-09-29 DIAGNOSIS — Z124 Encounter for screening for malignant neoplasm of cervix: Secondary | ICD-10-CM

## 2012-09-29 DIAGNOSIS — Z01419 Encounter for gynecological examination (general) (routine) without abnormal findings: Secondary | ICD-10-CM

## 2012-09-29 LAB — BASIC METABOLIC PANEL
BUN: 13 mg/dL (ref 6–23)
CO2: 28 mEq/L (ref 19–32)
Calcium: 9.6 mg/dL (ref 8.4–10.5)
Creat: 0.74 mg/dL (ref 0.50–1.10)
Glucose, Bld: 90 mg/dL (ref 70–99)

## 2012-09-29 NOTE — Patient Instructions (Signed)

## 2012-09-29 NOTE — Progress Notes (Signed)
79 y.o. [name] WidowedCaucasianF here for annual exam.  No vaginal bleeding.  Doing well.  Has h/o pulmonary nodules.  See Dr. Sherene Sires yearly.  Has had follow-up CT which was stable.  Did go to pelvic PT due to incontinence.  Dr. Margarita Grizzle.    Saw Dr. Jonny Ruiz 6/20 due to abdominal pian, change in bowel habits.  Feels firmness in abdomen.  Was told to go to ER if had problems, was possibly a hernia.  Patient unhappy with this.  Patient's last menstrual period was 04/08/1996.          Sexually active: no  The current method of family planning is post menopausal status.    Exercising: no  not regularly Smoker:  no  Health Maintenance: Pap:  09/05/11 WNL History of abnormal Pap:  no MMG:  09/25/12 normal Colonoscopy:  2012 repeat in 5 years, Dr. Juanda Chance BMD:   09/17/11, -1.0/-1.8 frax 13.1%/3.2% TDaP:  Up to date with Dr Posey Rea, 3/05 Screening Labs: PCP, Hb today: PCP, Urine today: PCP   reports that she has never smoked. She has never used smokeless tobacco. She reports that she does not drink alcohol or use illicit drugs.  Past Medical History  Diagnosis Date  . Hypertension   . Hyperlipidemia   . DVT (deep venous thrombosis) 1998    hx of right  . GERD (gastroesophageal reflux disease)   . Osteoporosis   . Esophagitis 2006  . Esophageal stricture   . Palpitation   . Kidney cysts     left- Dr Aldean Ast  . Diverticulosis of colon   . IBS (irritable bowel syndrome)   . History of shingles   . Glucose intolerance (impaired glucose tolerance)   . Pneumonia 2011    hx of  . Impaired glucose tolerance 11/22/2010  . Multiple lung nodules 2012    seen on CT scan by Dr Sherene Sires, pulmonologist    Past Surgical History  Procedure Laterality Date  . Cholecystectomy    . Tubal ligation Bilateral   . Appendectomy    . Hysteroscopy with resectoscope  7/99    resect polyp    Current Outpatient Prescriptions  Medication Sig Dispense Refill  . aspirin 81 MG tablet Take 81 mg by mouth daily.         . Coenzyme Q10 (CO Q 10 PO) Take by mouth daily.      . fish oil-omega-3 fatty acids 1000 MG capsule Take 1 g by mouth daily.        . furosemide (LASIX) 40 MG tablet Take 1 tablet (40 mg total) by mouth daily as needed.  90 tablet  3  . olmesartan (BENICAR) 20 MG tablet Take 1 tablet (20 mg total) by mouth daily.  90 tablet  3  . potassium chloride SA (K-DUR,KLOR-CON) 20 MEQ tablet Take 20 mEq by mouth daily. As needed with Lasix.      Marland Kitchen simvastatin (ZOCOR) 10 MG tablet TAKE 1 TABLET BY MOUTH DAILY AT BEDTIME  90 tablet  0  . vitamin C (ASCORBIC ACID) 500 MG tablet Take 1,000 mg by mouth daily.      . Vitamin Mixture (VITAMIN E COMPLETE PO) Take by mouth.      . benzonatate (TESSALON) 200 MG capsule Take 1 capsule (200 mg total) by mouth 3 (three) times daily as needed for cough.  60 capsule  1  . budesonide-formoterol (SYMBICORT) 160-4.5 MCG/ACT inhaler Inhale 2 puffs into the lungs 2 (two) times daily.  1 Inhaler  6  .  chlorpheniramine-HYDROcodone (TUSSIONEX PENNKINETIC ER) 10-8 MG/5ML LQCR Take 5 mLs by mouth every 12 (twelve) hours as needed.  140 mL  0  . cholecalciferol (VITAMIN D) 1000 UNITS tablet Take 1,000 Units by mouth daily.       No current facility-administered medications for this visit.    Family History  Problem Relation Age of Onset  . Asthma Mother   . Diabetes Father   . Cancer Father     liver  . Cancer Sister     breast  . Diabetes Sister   . Asthma Sister   . Breast cancer Sister   . Diabetes Brother   . Asthma Brother   . Diabetes Brother   . Asthma Sister   . Coronary artery disease Other     1 st degree female relative    ROS:  Pertinent items are noted in HPI.  Otherwise, a comprehensive ROS was negative.  Exam:   BP 110/76  Pulse 60  Resp 16  Ht 4' 10.5" (1.486 m)  Wt 168 lb 6.4 oz (76.386 kg)  BMI 34.59 kg/m2  LMP 04/08/1996  Weight change: +2   Height: 4' 10.5" (148.6 cm)  Ht Readings from Last 3 Encounters:  09/29/12 4' 10.5" (1.486  m)  09/25/12 4\' 11"  (1.499 m)  06/06/11 4\' 11"  (1.499 m)    General appearance: alert, cooperative and appears stated age Head: Normocephalic, without obvious abnormality, atraumatic Neck: no adenopathy, supple, symmetrical, trachea midline and thyroid normal to inspection and palpation Lungs: clear to auscultation bilaterally Breasts: normal appearance, no masses or tenderness Heart: regular rate and rhythm Abdomen: soft, non-tender; bowel sounds normal; no masses,  no organomegaly Extremities: extremities normal, atraumatic, no cyanosis or edema Skin: Skin color, texture, turgor normal. No rashes or lesions Lymph nodes: Cervical, supraclavicular, and axillary nodes normal. No abnormal inguinal nodes palpated Neurologic: Grossly normal   Pelvic: External genitalia:  no lesions              Urethra:  normal appearing urethra with no masses, tenderness or lesions              Bartholins and Skenes: normal                 Vagina: normal appearing vagina with normal color and discharge, no lesions              Cervix: no lesions              Pap taken: no Bimanual Exam:  Uterus:  normal size, contour, position, consistency, mobility, non-tender              Adnexa: normal adnexa and no mass, fullness, tenderness               Rectovaginal: Confirms               Anus:  normal sphincter tone, no lesions  A:  Well Woman with normal exam   P:   mammogram pap smear return annually or prn  An After Visit Summary was printed and given to the patient.

## 2012-09-30 ENCOUNTER — Ambulatory Visit
Admission: RE | Admit: 2012-09-30 | Discharge: 2012-09-30 | Disposition: A | Discharge status: Home / Self Care | Payer: Medicare Other | Source: Ambulatory Visit | Attending: Obstetrics & Gynecology | Admitting: Obstetrics & Gynecology

## 2012-09-30 DIAGNOSIS — R109 Unspecified abdominal pain: Secondary | ICD-10-CM

## 2012-09-30 DIAGNOSIS — K5730 Diverticulosis of large intestine without perforation or abscess without bleeding: Secondary | ICD-10-CM | POA: Diagnosis not present

## 2012-09-30 MED ORDER — IOHEXOL 300 MG/ML  SOLN
100.0000 mL | Freq: Once | INTRAMUSCULAR | Status: AC | PRN
  Administered 2012-09-30: 100 mL via INTRAVENOUS

## 2012-10-01 ENCOUNTER — Telehealth: Payer: Self-pay

## 2012-10-01 NOTE — Telephone Encounter (Signed)
6/26 [id number]//[id number] 

## 2012-10-01 NOTE — Telephone Encounter (Signed)
Patient notified of ct results.

## 2012-10-01 NOTE — Telephone Encounter (Signed)
Message copied by Elisha Headland on Thu Oct 01, 2012  4:48 PM ------      Message from: Jerene Bears      Created: Thu Oct 01, 2012  1:48 PM       Inform ct did not show any abdominal wall mass or hernia.  Only showed diverticula.  I don't think she needs any other testing at this time. ------

## 2012-12-14 ENCOUNTER — Other Ambulatory Visit: Payer: Self-pay | Admitting: Internal Medicine

## 2013-01-05 DIAGNOSIS — Z23 Encounter for immunization: Secondary | ICD-10-CM | POA: Diagnosis not present

## 2013-01-12 ENCOUNTER — Other Ambulatory Visit: Payer: Self-pay | Admitting: Internal Medicine

## 2013-01-12 NOTE — Telephone Encounter (Signed)
Refill done.  

## 2013-01-21 DIAGNOSIS — S43429A Sprain of unspecified rotator cuff capsule, initial encounter: Secondary | ICD-10-CM | POA: Diagnosis not present

## 2013-01-29 DIAGNOSIS — M19019 Primary osteoarthritis, unspecified shoulder: Secondary | ICD-10-CM | POA: Diagnosis not present

## 2013-02-02 DIAGNOSIS — M19019 Primary osteoarthritis, unspecified shoulder: Secondary | ICD-10-CM | POA: Diagnosis not present

## 2013-02-23 ENCOUNTER — Telehealth: Payer: Self-pay | Admitting: *Deleted

## 2013-02-23 NOTE — Telephone Encounter (Signed)
Pt called requesting whether she is to have the Prevnar injection.  Please advise

## 2013-02-23 NOTE — Telephone Encounter (Signed)
Sure. Pls sch to see Diana Stout for it Thx

## 2013-02-24 NOTE — Telephone Encounter (Signed)
Spoke with pt advised of MDs message 

## 2013-03-09 ENCOUNTER — Ambulatory Visit (INDEPENDENT_AMBULATORY_CARE_PROVIDER_SITE_OTHER): Payer: Medicare Other | Admitting: *Deleted

## 2013-03-09 DIAGNOSIS — Z23 Encounter for immunization: Secondary | ICD-10-CM

## 2013-05-18 ENCOUNTER — Ambulatory Visit (INDEPENDENT_AMBULATORY_CARE_PROVIDER_SITE_OTHER): Payer: Medicare Other | Admitting: Internal Medicine

## 2013-05-18 ENCOUNTER — Encounter: Payer: Self-pay | Admitting: Internal Medicine

## 2013-05-18 ENCOUNTER — Other Ambulatory Visit (INDEPENDENT_AMBULATORY_CARE_PROVIDER_SITE_OTHER): Payer: Medicare Other

## 2013-05-18 VITALS — BP 120/74 | HR 72 | Temp 97.0°F | Resp 16 | Wt 171.0 lb

## 2013-05-18 DIAGNOSIS — R5383 Other fatigue: Secondary | ICD-10-CM

## 2013-05-18 DIAGNOSIS — I10 Essential (primary) hypertension: Secondary | ICD-10-CM | POA: Diagnosis not present

## 2013-05-18 DIAGNOSIS — D649 Anemia, unspecified: Secondary | ICD-10-CM | POA: Diagnosis not present

## 2013-05-18 DIAGNOSIS — R5381 Other malaise: Secondary | ICD-10-CM

## 2013-05-18 DIAGNOSIS — R300 Dysuria: Secondary | ICD-10-CM

## 2013-05-18 DIAGNOSIS — J069 Acute upper respiratory infection, unspecified: Secondary | ICD-10-CM | POA: Diagnosis not present

## 2013-05-18 HISTORY — DX: Dysuria: R30.0

## 2013-05-18 LAB — URINALYSIS, ROUTINE W REFLEX MICROSCOPIC
BILIRUBIN URINE: NEGATIVE
HGB URINE DIPSTICK: NEGATIVE
Ketones, ur: NEGATIVE
Nitrite: NEGATIVE
RBC / HPF: NONE SEEN (ref 0–?)
Specific Gravity, Urine: 1.02 (ref 1.000–1.030)
TOTAL PROTEIN, URINE-UPE24: NEGATIVE
URINE GLUCOSE: NEGATIVE
Urobilinogen, UA: 0.2 (ref 0.0–1.0)
pH: 5.5 (ref 5.0–8.0)

## 2013-05-18 LAB — LIPID PANEL
CHOL/HDL RATIO: 4
Cholesterol: 178 mg/dL (ref 0–200)
HDL: 46.1 mg/dL (ref >39.00)
LDL CALC: 113 mg/dL — AB (ref 0–99)
Triglycerides: 93 mg/dL (ref 0.0–149.0)
VLDL: 18.6 mg/dL (ref 0.0–40.0)

## 2013-05-18 LAB — BASIC METABOLIC PANEL
BUN: 17 mg/dL (ref 6–23)
CO2: 29 mEq/L (ref 19–32)
CREATININE: 0.7 mg/dL (ref 0.4–1.2)
Calcium: 9.5 mg/dL (ref 8.4–10.5)
Chloride: 106 mEq/L (ref 96–112)
GFR: 91.54 mL/min (ref >60.00)
Glucose, Bld: 101 mg/dL — ABNORMAL HIGH (ref 70–99)
POTASSIUM: 4.3 meq/L (ref 3.5–5.1)
Sodium: 140 mEq/L (ref 135–145)

## 2013-05-18 LAB — HEPATIC FUNCTION PANEL
ALK PHOS: 63 U/L (ref 39–117)
ALT: 18 U/L (ref 0–35)
AST: 21 U/L (ref 0–37)
Albumin: 3.7 g/dL (ref 3.5–5.2)
BILIRUBIN DIRECT: 0 mg/dL (ref 0.0–0.3)
TOTAL PROTEIN: 7 g/dL (ref 6.0–8.3)
Total Bilirubin: 0.7 mg/dL (ref 0.3–1.2)

## 2013-05-18 LAB — CBC WITH DIFFERENTIAL/PLATELET
BASOS ABS: 0 10*3/uL (ref 0.0–0.1)
BASOS PCT: 0.6 % (ref 0.0–3.0)
EOS ABS: 0.1 10*3/uL (ref 0.0–0.7)
Eosinophils Relative: 2.6 % (ref 0.0–5.0)
HEMATOCRIT: 39.1 % (ref 36.0–46.0)
HEMOGLOBIN: 12.8 g/dL (ref 12.0–15.0)
LYMPHS PCT: 30.6 % (ref 12.0–46.0)
Lymphs Abs: 1.3 10*3/uL (ref 0.7–4.0)
MCHC: 32.6 g/dL (ref 30.0–36.0)
MCV: 86.6 fl (ref 78.0–100.0)
MONO ABS: 0.4 10*3/uL (ref 0.1–1.0)
MONOS PCT: 8.8 % (ref 3.0–12.0)
Neutro Abs: 2.4 10*3/uL (ref 1.4–7.7)
Neutrophils Relative %: 57.4 % (ref 43.0–77.0)
PLATELETS: 200 10*3/uL (ref 150.0–400.0)
RBC: 4.52 Mil/uL (ref 3.87–5.11)
RDW: 15.7 % — ABNORMAL HIGH (ref 11.5–14.6)
WBC: 4.1 10*3/uL — AB (ref 4.5–10.5)

## 2013-05-18 LAB — TSH: TSH: 1 u[IU]/mL (ref 0.35–5.50)

## 2013-05-18 MED ORDER — OLMESARTAN MEDOXOMIL 20 MG PO TABS: ORAL_TABLET | ORAL | Status: DC

## 2013-05-18 MED ORDER — AZITHROMYCIN 250 MG PO TABS: ORAL_TABLET | ORAL | Status: DC

## 2013-05-18 NOTE — Progress Notes (Signed)
   Subjective:     Cough This is a new problem. The current episode started 1 to 4 weeks ago. The problem has been gradually improving. The cough is non-productive. Associated symptoms include postnasal drip, rhinorrhea and a sore throat. Pertinent negatives include no chills, headaches, rash, shortness of breath or wheezing.  F/u HTN, abn chest CT She torn her R rot cuff a year ago (MRI in Nov) - no surgery C/o dysuria, sinus congestion    BP Readings from Last 3 Encounters:  05/18/13 120/74  09/29/12 110/76  09/25/12 120/80   Wt Readings from Last 3 Encounters:  05/18/13 171 lb (77.565 kg)  09/29/12 168 lb 6.4 oz (76.386 kg)  09/25/12 166 lb 8 oz (75.524 kg)     Review of Systems  Constitutional: Negative for chills, activity change, appetite change, fatigue and unexpected weight change.  HENT: Positive for postnasal drip, rhinorrhea and sore throat. Negative for congestion, mouth sores and sinus pressure.   Eyes: Negative for visual disturbance.  Respiratory: Positive for cough. Negative for chest tightness, shortness of breath and wheezing.   Gastrointestinal: Negative for nausea and abdominal pain.  Genitourinary: Negative for frequency, difficulty urinating and vaginal pain.  Musculoskeletal: Negative for back pain and gait problem.  Skin: Negative for pallor and rash.  Neurological: Negative for dizziness, tremors, weakness, numbness and headaches.  Psychiatric/Behavioral: Negative for confusion and sleep disturbance.       Objective:   Physical Exam  Constitutional: She appears well-developed. No distress.  HENT:  Head: Normocephalic.  Right Ear: External ear normal.  Left Ear: External ear normal.  Nose: Nose normal.  Mouth/Throat: Oropharynx is clear and moist.  Eyes: Conjunctivae are normal. Pupils are equal, round, and reactive to light. Right eye exhibits no discharge. Left eye exhibits no discharge.  Neck: Normal range of motion. Neck supple. No JVD  present. No tracheal deviation present. No thyromegaly present.  Cardiovascular: Normal rate, regular rhythm and normal heart sounds.   Pulmonary/Chest: No stridor. No respiratory distress. She has no wheezes.  Abdominal: Soft. Bowel sounds are normal. She exhibits no distension and no mass. There is no tenderness. There is no rebound and no guarding.  Musculoskeletal: She exhibits no edema and no tenderness.  Lymphadenopathy:    She has no cervical adenopathy.  Neurological: She displays normal reflexes. No cranial nerve deficit. She exhibits normal muscle tone. Coordination normal.  Skin: No rash noted. No erythema.  Psychiatric: She has a normal mood and affect. Her behavior is normal. Judgment and thought content normal.    CT chest  Lab Results  Component Value Date   WBC 4.7 05/05/2012   HGB 11.8* 05/05/2012   HCT 35.5* 05/05/2012   PLT 266.0 05/05/2012   GLUCOSE 90 09/29/2012   CHOL 188 10/01/2011   TRIG 141.0 10/01/2011   HDL 47.50 10/01/2011   LDLDIRECT 154.9 05/01/2011   LDLCALC 112* 10/01/2011   ALT 18 05/01/2011   AST 15 05/01/2011   NA 140 09/29/2012   K 4.3 09/29/2012   CL 102 09/29/2012   CREATININE 0.74 09/29/2012   BUN 13 09/29/2012   CO2 28 09/29/2012   TSH 0.74 05/01/2011   HGBA1C 5.9 07/11/2009       Assessment & Plan:

## 2013-05-18 NOTE — Assessment & Plan Note (Signed)
Continue with current prescription therapy as reflected on the Med list.  

## 2013-05-18 NOTE — Assessment & Plan Note (Signed)
Mild  Labs 

## 2013-05-18 NOTE — Assessment & Plan Note (Signed)
Zpac 

## 2013-05-18 NOTE — Assessment & Plan Note (Signed)
UA and cx

## 2013-05-18 NOTE — Assessment & Plan Note (Signed)
labs

## 2013-05-18 NOTE — Progress Notes (Signed)
Pre visit review using our clinic review tool, if applicable. No additional management support is needed unless otherwise documented below in the visit note. 

## 2013-05-19 LAB — CULTURE, URINE COMPREHENSIVE
Colony Count: NO GROWTH
Organism ID, Bacteria: NO GROWTH

## 2013-05-21 DIAGNOSIS — D1801 Hemangioma of skin and subcutaneous tissue: Secondary | ICD-10-CM | POA: Diagnosis not present

## 2013-05-21 DIAGNOSIS — L820 Inflamed seborrheic keratosis: Secondary | ICD-10-CM | POA: Diagnosis not present

## 2013-05-21 DIAGNOSIS — L821 Other seborrheic keratosis: Secondary | ICD-10-CM | POA: Diagnosis not present

## 2013-05-21 DIAGNOSIS — L819 Disorder of pigmentation, unspecified: Secondary | ICD-10-CM | POA: Diagnosis not present

## 2013-05-21 DIAGNOSIS — L570 Actinic keratosis: Secondary | ICD-10-CM | POA: Diagnosis not present

## 2013-05-21 DIAGNOSIS — Z85828 Personal history of other malignant neoplasm of skin: Secondary | ICD-10-CM | POA: Diagnosis not present

## 2013-06-19 ENCOUNTER — Emergency Department (HOSPITAL_COMMUNITY)
Admission: EM | Admit: 2013-06-19 | Discharge: 2013-06-20 | Disposition: A | Discharge status: Home / Self Care | Payer: Medicare Other | Attending: Emergency Medicine | Admitting: Emergency Medicine

## 2013-06-19 ENCOUNTER — Encounter (HOSPITAL_COMMUNITY): Payer: Self-pay | Admitting: Emergency Medicine

## 2013-06-19 DIAGNOSIS — Z7982 Long term (current) use of aspirin: Secondary | ICD-10-CM | POA: Diagnosis not present

## 2013-06-19 DIAGNOSIS — R000 Tachycardia, unspecified: Secondary | ICD-10-CM | POA: Insufficient documentation

## 2013-06-19 DIAGNOSIS — Z8619 Personal history of other infectious and parasitic diseases: Secondary | ICD-10-CM | POA: Insufficient documentation

## 2013-06-19 DIAGNOSIS — M810 Age-related osteoporosis without current pathological fracture: Secondary | ICD-10-CM | POA: Insufficient documentation

## 2013-06-19 DIAGNOSIS — Z9851 Tubal ligation status: Secondary | ICD-10-CM | POA: Diagnosis not present

## 2013-06-19 DIAGNOSIS — I10 Essential (primary) hypertension: Secondary | ICD-10-CM | POA: Insufficient documentation

## 2013-06-19 DIAGNOSIS — Z9089 Acquired absence of other organs: Secondary | ICD-10-CM | POA: Insufficient documentation

## 2013-06-19 DIAGNOSIS — R112 Nausea with vomiting, unspecified: Secondary | ICD-10-CM | POA: Diagnosis not present

## 2013-06-19 DIAGNOSIS — Z8719 Personal history of other diseases of the digestive system: Secondary | ICD-10-CM | POA: Diagnosis not present

## 2013-06-19 DIAGNOSIS — E785 Hyperlipidemia, unspecified: Secondary | ICD-10-CM | POA: Insufficient documentation

## 2013-06-19 DIAGNOSIS — Z87448 Personal history of other diseases of urinary system: Secondary | ICD-10-CM | POA: Diagnosis not present

## 2013-06-19 DIAGNOSIS — Z86718 Personal history of other venous thrombosis and embolism: Secondary | ICD-10-CM | POA: Diagnosis not present

## 2013-06-19 DIAGNOSIS — R109 Unspecified abdominal pain: Secondary | ICD-10-CM | POA: Insufficient documentation

## 2013-06-19 DIAGNOSIS — Z79899 Other long term (current) drug therapy: Secondary | ICD-10-CM | POA: Insufficient documentation

## 2013-06-19 DIAGNOSIS — Z9889 Other specified postprocedural states: Secondary | ICD-10-CM | POA: Diagnosis not present

## 2013-06-19 LAB — COMPREHENSIVE METABOLIC PANEL
ALT: 13 U/L (ref 0–35)
AST: 21 U/L (ref 0–37)
Albumin: 3.7 g/dL (ref 3.5–5.2)
Alkaline Phosphatase: 77 U/L (ref 39–117)
BUN: 20 mg/dL (ref 6–23)
CO2: 25 mEq/L (ref 19–32)
Calcium: 9 mg/dL (ref 8.4–10.5)
Chloride: 103 mEq/L (ref 96–112)
Creatinine, Ser: 0.65 mg/dL (ref 0.50–1.10)
GFR calc Af Amer: >90 mL/min (ref >90)
GFR calc non Af Amer: 82 mL/min — ABNORMAL LOW (ref >90)
Glucose, Bld: 160 mg/dL — ABNORMAL HIGH (ref 70–99)
Potassium: 4.1 mEq/L (ref 3.7–5.3)
Sodium: 142 mEq/L (ref 137–147)
Total Bilirubin: 0.5 mg/dL (ref 0.3–1.2)
Total Protein: 7.2 g/dL (ref 6.0–8.3)

## 2013-06-19 LAB — CBC WITH DIFFERENTIAL/PLATELET
Basophils Absolute: 0 10*3/uL (ref 0.0–0.1)
Basophils Relative: 0 % (ref 0–1)
Eosinophils Absolute: 0 10*3/uL (ref 0.0–0.7)
Eosinophils Relative: 0 % (ref 0–5)
HCT: 40.9 % (ref 36.0–46.0)
Hemoglobin: 13.7 g/dL (ref 12.0–15.0)
Lymphocytes Relative: 6 % — ABNORMAL LOW (ref 12–46)
Lymphs Abs: 0.5 10*3/uL — ABNORMAL LOW (ref 0.7–4.0)
MCH: 28.9 pg (ref 26.0–34.0)
MCHC: 33.5 g/dL (ref 30.0–36.0)
MCV: 86.3 fL (ref 78.0–100.0)
Monocytes Absolute: 0.4 10*3/uL (ref 0.1–1.0)
Monocytes Relative: 5 % (ref 3–12)
Neutro Abs: 7 10*3/uL (ref 1.7–7.7)
Neutrophils Relative %: 89 % — ABNORMAL HIGH (ref 43–77)
Platelets: 194 10*3/uL (ref 150–400)
RBC: 4.74 MIL/uL (ref 3.87–5.11)
RDW: 14.9 % (ref 11.5–15.5)
WBC: 7.8 10*3/uL (ref 4.0–10.5)

## 2013-06-19 LAB — URINALYSIS, ROUTINE W REFLEX MICROSCOPIC
Bilirubin Urine: NEGATIVE
Glucose, UA: NEGATIVE mg/dL
Hgb urine dipstick: NEGATIVE
Ketones, ur: 40 mg/dL — AB
Nitrite: NEGATIVE
Protein, ur: NEGATIVE mg/dL
Specific Gravity, Urine: 1.029 (ref 1.005–1.030)
Urobilinogen, UA: 0.2 mg/dL (ref 0.0–1.0)
pH: 5 (ref 5.0–8.0)

## 2013-06-19 LAB — URINE MICROSCOPIC-ADD ON

## 2013-06-19 MED ORDER — SODIUM CHLORIDE 0.9 % IV BOLUS (SEPSIS)
1000.0000 mL | Freq: Once | INTRAVENOUS | Status: AC
  Administered 2013-06-19: 1000 mL via INTRAVENOUS

## 2013-06-19 MED ORDER — ONDANSETRON HCL 4 MG/2ML IJ SOLN
4.0000 mg | Freq: Once | INTRAMUSCULAR | Status: AC
  Administered 2013-06-19: 4 mg via INTRAVENOUS
  Filled 2013-06-19: qty 2

## 2013-06-19 NOTE — ED Notes (Addendum)
Pt. reports nausea and vomitting onset noon today with intermittent low abdominal pain , denies diarrhea / no fever or chills.

## 2013-06-20 MED ORDER — ONDANSETRON HCL 4 MG PO TABS: 4.0000 mg | ORAL_TABLET | Freq: Four times a day (QID) | ORAL | Status: DC

## 2013-06-21 LAB — URINE CULTURE

## 2013-06-22 ENCOUNTER — Telehealth: Payer: Self-pay

## 2013-06-22 NOTE — Telephone Encounter (Signed)
The patient called and is hoping Dr.Plotnikov will review her test results from her hospital trip.   Callback - [phone]

## 2013-06-24 NOTE — Telephone Encounter (Signed)
The patient called back and is requesting a call back from [place] as soon as possible.  I informed her she was gone for the day, but she would return her call as soon as possible.   Thanks!

## 2013-06-24 NOTE — Telephone Encounter (Signed)
Is it labs from 3/14? They are c/w a UTI. We can repeat a UA in 2 wks Thx

## 2013-06-25 NOTE — Telephone Encounter (Signed)
Left mess for patient to call back.  

## 2013-06-29 NOTE — Telephone Encounter (Signed)
Need hospital follow up to discuss

## 2013-06-29 NOTE — Telephone Encounter (Signed)
Patient is calling back. Wants a call to discuss the rest of her hospital labs to determine if OV in necessary. Please advise.

## 2013-07-01 NOTE — Telephone Encounter (Signed)
Called patient back. Says she will not come in for OV until Dr. Alain Marion reviews lab results. Told her I was not certain if he has reviewed or not.

## 2013-07-02 NOTE — ED Provider Notes (Signed)
CSN: [id number]     Arrival date & time 06/19/13  2039 History   First MD Initiated Contact with Patient 06/19/13 2111     Chief Complaint  Patient presents with  . Emesis     (Consider location/radiation/quality/duration/timing/severity/associated sxs/prior Treatment) Patient is a 80 y.o. female presenting with vomiting. The history is provided by the patient.  Emesis Severity:  Mild Timing:  Intermittent Quality:  Unable to specify Progression:  Partially resolved Recent urination:  Normal Context: not post-tussive   Relieved by:  None tried Associated symptoms: abdominal pain   Associated symptoms: no chills, no cough, no diarrhea, no fever, no headaches, no myalgias and no URI   Risk factors: prior abdominal surgery   Risk factors: no alcohol use, no sick contacts and no suspect food intake     Past Medical History  Diagnosis Date  . Hypertension   . Hyperlipidemia   . DVT (deep venous thrombosis) 1998    hx of right  . GERD (gastroesophageal reflux disease)   . Osteoporosis   . Esophagitis 2006  . Palpitation   . Kidney cysts     left- Dr [name]  . Diverticulosis of colon   . IBS (irritable bowel syndrome)   . History of shingles   . Glucose intolerance (impaired glucose tolerance)   . Impaired glucose tolerance 11/22/2010  . Multiple lung nodules 2012    seen on CT scan by Dr [name], pulmonologist   Past Surgical History  Procedure Laterality Date  . Cholecystectomy    . Tubal ligation Bilateral   . Appendectomy    . Hysteroscopy with resectoscope  7/99    resect polyp   Family History  Problem Relation Age of Onset  . Asthma Mother   . Diabetes Father   . Cancer Father     liver  . Cancer Sister     breast  . Diabetes Sister   . Asthma Sister   . Breast cancer Sister   . Diabetes Brother   . Asthma Brother   . Diabetes Brother   . Asthma Sister   . Coronary artery disease Other     1 st degree female relative   History  Substance Use Topics   . Smoking status: Never Smoker   . Smokeless tobacco: Never Used  . Alcohol Use: No   OB History   Grav Para Term Preterm Abortions TAB SAB Ect Mult Living   3 3 3       3      Review of Systems  Constitutional: Negative for chills.  Gastrointestinal: Positive for vomiting and abdominal pain. Negative for diarrhea.  Musculoskeletal: Negative for myalgias.  Neurological: Negative for headaches.    All systems reviewed and negative, other than as noted in HPI.    Allergies  Alendronate sodium; Atorvastatin; Ceftriaxone sodium; Codeine phosphate; and Moxifloxacin  Home Medications   Current Outpatient Rx  Name  Route  Sig  Dispense  Refill  . aspirin 81 MG tablet   Oral   Take 81 mg by mouth daily.           Marland Kitchen azithromycin (ZITHROMAX) 250 MG tablet      As directed   6 tablet   0   . benzonatate (TESSALON) 200 MG capsule   Oral   Take 1 capsule (200 mg total) by mouth 3 (three) times daily as needed for cough.   60 capsule   1   . EXPIRED: budesonide-formoterol (SYMBICORT) 160-4.5 MCG/ACT inhaler  Inhalation   Inhale 2 puffs into the lungs 2 (two) times daily.   1 Inhaler   6   . chlorpheniramine-HYDROcodone (TUSSIONEX PENNKINETIC ER) 10-8 MG/5ML LQCR   Oral   Take 5 mLs by mouth every 12 (twelve) hours as needed.   140 mL   0   . EXPIRED: cholecalciferol (VITAMIN D) 1000 UNITS tablet   Oral   Take 1,000 Units by mouth daily.         . Coenzyme Q10 (CO Q 10 PO)   Oral   Take by mouth daily.         . fish oil-omega-3 fatty acids 1000 MG capsule   Oral   Take 1 g by mouth daily.           . furosemide (LASIX) 40 MG tablet   Oral   Take 1 tablet (40 mg total) by mouth daily as needed.   90 tablet   3   . olmesartan (BENICAR) 20 MG tablet      TAKE 1 TABLET BY MOUTH ONCE DAILY   90 tablet   3   . ondansetron (ZOFRAN) 4 MG tablet   Oral   Take 1 tablet (4 mg total) by mouth every 6 (six) hours.   12 tablet   0   . potassium  chloride SA (K-DUR,KLOR-CON) 20 MEQ tablet   Oral   Take 20 mEq by mouth daily. As needed with Lasix.         Marland Kitchen simvastatin (ZOCOR) 10 MG tablet      TAKE 1 TABLET BY MOUTH DAILY AT BEDTIME   90 tablet   0   . vitamin C (ASCORBIC ACID) 500 MG tablet   Oral   Take 1,000 mg by mouth daily.         . vitamin E 400 UNIT capsule   Oral   Take 400 Units by mouth daily.         . Vitamin Mixture (VITAMIN E COMPLETE PO)   Oral   Take by mouth.          BP 102/52  Pulse 90  Temp(Src) 97.7 F (36.5 C) (Oral)  Resp 18  Wt 169 lb 4 oz (76.771 kg)  SpO2 96%  LMP 04/08/1996 Physical Exam  Nursing note and vitals reviewed. Constitutional: She appears well-developed and well-nourished. No distress.  HENT:  Head: Normocephalic and atraumatic.  Eyes: Conjunctivae are normal. Right eye exhibits no discharge. Left eye exhibits no discharge.  Neck: Neck supple.  Cardiovascular: Regular rhythm and normal heart sounds.  Exam reveals no gallop and no friction rub.   No murmur heard. tachycardic  Pulmonary/Chest: Effort normal and breath sounds normal. No respiratory distress.  Abdominal: Soft. She exhibits no distension. There is no tenderness.  Musculoskeletal: She exhibits no edema and no tenderness.  Neurological: She is alert.  Skin: Skin is warm and dry.  Psychiatric: She has a normal mood and affect. Her behavior is normal. Thought content normal.    ED Course  Procedures (including critical care time) Labs Review Labs Reviewed  URINALYSIS, ROUTINE W REFLEX MICROSCOPIC - Abnormal; Notable for the following:    APPearance CLOUDY (*)    Ketones, ur 40 (*)    Leukocytes, UA MODERATE (*)    All other components within normal limits  CBC WITH DIFFERENTIAL - Abnormal; Notable for the following:    Neutrophils Relative % 89 (*)    Lymphocytes Relative 6 (*)    Lymphs Abs  0.5 (*)    All other components within normal limits  COMPREHENSIVE METABOLIC PANEL - Abnormal;  Notable for the following:    Glucose, Bld 160 (*)    GFR calc non Af Amer 82 (*)    All other components within normal limits  URINE MICROSCOPIC-ADD ON - Abnormal; Notable for the following:    Squamous Epithelial / LPF MANY (*)    Bacteria, UA FEW (*)    All other components within normal limits  URINE CULTURE   Imaging Review No results found.   EKG Interpretation None      MDM   Final diagnoses:  Nausea and vomiting    82yF with n/v. Abdominal pain without tenderness on exam. Possible viral illness? Doubt emergent surgical process. HR improved with IVF. Symptoms much improved. HD stable and w/u reassuring.    Virgel Manifold, MD 07/02/13 [phone]

## 2013-07-19 ENCOUNTER — Ambulatory Visit (INDEPENDENT_AMBULATORY_CARE_PROVIDER_SITE_OTHER): Payer: Medicare Other | Admitting: Internal Medicine

## 2013-07-19 ENCOUNTER — Encounter: Payer: Self-pay | Admitting: Internal Medicine

## 2013-07-19 ENCOUNTER — Ambulatory Visit: Payer: Medicare Other

## 2013-07-19 VITALS — BP 130/70 | HR 80 | Temp 96.9°F | Resp 16 | Wt 173.0 lb

## 2013-07-19 DIAGNOSIS — R5381 Other malaise: Secondary | ICD-10-CM | POA: Diagnosis not present

## 2013-07-19 DIAGNOSIS — R5383 Other fatigue: Secondary | ICD-10-CM

## 2013-07-19 DIAGNOSIS — R7309 Other abnormal glucose: Secondary | ICD-10-CM

## 2013-07-19 DIAGNOSIS — I10 Essential (primary) hypertension: Secondary | ICD-10-CM

## 2013-07-19 DIAGNOSIS — R197 Diarrhea, unspecified: Secondary | ICD-10-CM

## 2013-07-19 DIAGNOSIS — R42 Dizziness and giddiness: Secondary | ICD-10-CM

## 2013-07-19 HISTORY — DX: Dizziness and giddiness: R42

## 2013-07-19 HISTORY — DX: Diarrhea, unspecified: R19.7

## 2013-07-19 LAB — BASIC METABOLIC PANEL
BUN: 17 mg/dL (ref 6–23)
CALCIUM: 9.2 mg/dL (ref 8.4–10.5)
CO2: 29 mEq/L (ref 19–32)
CREATININE: 0.6 mg/dL (ref 0.4–1.2)
Chloride: 105 mEq/L (ref 96–112)
GFR: 96.54 mL/min (ref >60.00)
GLUCOSE: 99 mg/dL (ref 70–99)
Potassium: 4.4 mEq/L (ref 3.5–5.1)
Sodium: 142 mEq/L (ref 135–145)

## 2013-07-19 LAB — CBC WITH DIFFERENTIAL/PLATELET
BASOS ABS: 0 10*3/uL (ref 0.0–0.1)
Basophils Relative: 0.4 % (ref 0.0–3.0)
EOS ABS: 0.1 10*3/uL (ref 0.0–0.7)
Eosinophils Relative: 2.9 % (ref 0.0–5.0)
HCT: 37.4 % (ref 36.0–46.0)
HEMOGLOBIN: 12.4 g/dL (ref 12.0–15.0)
LYMPHS PCT: 34.4 % (ref 12.0–46.0)
Lymphs Abs: 1.6 10*3/uL (ref 0.7–4.0)
MCHC: 33 g/dL (ref 30.0–36.0)
MCV: 85.7 fl (ref 78.0–100.0)
MONOS PCT: 9.3 % (ref 3.0–12.0)
Monocytes Absolute: 0.4 10*3/uL (ref 0.1–1.0)
NEUTROS ABS: 2.4 10*3/uL (ref 1.4–7.7)
Neutrophils Relative %: 53 % (ref 43.0–77.0)
PLATELETS: 182 10*3/uL (ref 150.0–400.0)
RBC: 4.37 Mil/uL (ref 3.87–5.11)
RDW: 15.3 % — AB (ref 11.5–14.6)
WBC: 4.6 10*3/uL (ref 4.5–10.5)

## 2013-07-19 LAB — HEMOGLOBIN A1C: HEMOGLOBIN A1C: 6.1 % (ref 4.6–6.5)

## 2013-07-19 MED ORDER — CIPROFLOXACIN HCL 500 MG PO TABS: 500.0000 mg | ORAL_TABLET | Freq: Two times a day (BID) | ORAL | Status: DC

## 2013-07-19 MED ORDER — DIPHENOXYLATE-ATROPINE 2.5-0.025 MG PO TABS: 1.0000 | ORAL_TABLET | Freq: Four times a day (QID) | ORAL | Status: DC | PRN

## 2013-07-19 NOTE — Patient Instructions (Signed)
Hold Simvastatin Benicar 1/2 tab a day Use Almond Breeze milk

## 2013-07-19 NOTE — Progress Notes (Signed)
   Subjective:     Diarrhea  This is a new problem. The current episode started 1 to 4 weeks ago. The problem occurs less than 2 times per day. The problem has been waxing and waning. The stool consistency is described as watery. The patient states that diarrhea does not awaken her from sleep. Associated symptoms include abdominal pain and increased flatus. There are no known risk factors. The treatment provided no relief. Her past medical history is significant for irritable bowel syndrome. There is no history of inflammatory bowel disease, malabsorption or short gut syndrome.  F/u HTN, abn chest CT She torn her R rot cuff a year ago (MRI in Nov) - no surgery C/o dysuria, sinus congestion  C/o a dizzy spell on Sat w/a position change - lightheaded  BP Readings from Last 3 Encounters:  07/19/13 130/70  06/20/13 102/52  05/18/13 120/74   Wt Readings from Last 3 Encounters:  07/19/13 173 lb (78.472 kg)  06/19/13 169 lb 4 oz (76.771 kg)  05/18/13 171 lb (77.565 kg)     Review of Systems  Constitutional: Negative for activity change, appetite change, fatigue and unexpected weight change.  HENT: Negative for congestion, mouth sores and sinus pressure.   Eyes: Negative for visual disturbance.  Respiratory: Negative for chest tightness.   Gastrointestinal: Positive for abdominal pain, diarrhea and flatus. Negative for nausea.  Genitourinary: Negative for frequency, difficulty urinating and vaginal pain.  Musculoskeletal: Negative for back pain and gait problem.  Skin: Negative for pallor.  Neurological: Negative for dizziness, tremors, weakness and numbness.  Psychiatric/Behavioral: Negative for confusion and sleep disturbance.       Objective:   Physical Exam  Constitutional: She appears well-developed. No distress.  HENT:  Head: Normocephalic.  Right Ear: External ear normal.  Left Ear: External ear normal.  Nose: Nose normal.  Mouth/Throat: Oropharynx is clear and moist.   Eyes: Conjunctivae are normal. Pupils are equal, round, and reactive to light. Right eye exhibits no discharge. Left eye exhibits no discharge.  Neck: Normal range of motion. Neck supple. No JVD present. No tracheal deviation present. No thyromegaly present.  Cardiovascular: Normal rate, regular rhythm and normal heart sounds.   Pulmonary/Chest: No stridor. No respiratory distress. She has no wheezes.  Abdominal: Soft. Bowel sounds are normal. She exhibits no distension and no mass. There is no tenderness. There is no rebound and no guarding.  Musculoskeletal: She exhibits no edema and no tenderness.  Lymphadenopathy:    She has no cervical adenopathy.  Neurological: She displays normal reflexes. No cranial nerve deficit. She exhibits normal muscle tone. Coordination normal.  Skin: No rash noted. No erythema.  Psychiatric: She has a normal mood and affect. Her behavior is normal. Judgment and thought content normal.    CT chest  Lab Results  Component Value Date   WBC 7.8 06/19/2013   HGB 13.7 06/19/2013   HCT 40.9 06/19/2013   PLT 194 06/19/2013   GLUCOSE 160* 06/19/2013   CHOL 178 05/18/2013   TRIG 93.0 05/18/2013   HDL 46.10 05/18/2013   LDLDIRECT 154.9 05/01/2011   LDLCALC 113* 05/18/2013   ALT 13 06/19/2013   AST 21 06/19/2013   NA 142 06/19/2013   K 4.1 06/19/2013   CL 103 06/19/2013   CREATININE 0.65 06/19/2013   BUN 20 06/19/2013   CO2 25 06/19/2013   TSH 1.00 05/18/2013   HGBA1C 5.9 07/11/2009       Assessment & Plan:

## 2013-07-19 NOTE — Assessment & Plan Note (Signed)
Benicar 1/2

## 2013-07-19 NOTE — Assessment & Plan Note (Signed)
labs

## 2013-07-19 NOTE — Assessment & Plan Note (Signed)
4/15 x 1 mo ?etiol Labs avoid milk Cipro Lomotil Hold vitamins, Zocor

## 2013-07-19 NOTE — Progress Notes (Signed)
Pre visit review using our clinic review tool, if applicable. No additional management support is needed unless otherwise documented below in the visit note. 

## 2013-07-19 NOTE — Assessment & Plan Note (Signed)
Likely due to diarrhea/meds 4/15 Stop Lasix 1/2 Benicar

## 2013-07-20 LAB — GIARDIA/CRYPTOSPORIDIUM (EIA)
Cryptosporidium Screen (EIA): NEGATIVE
GIARDIA SCREEN (EIA): NEGATIVE

## 2013-07-20 LAB — CLOSTRIDIUM DIFFICILE EIA: CDIFTX: NEGATIVE

## 2013-07-20 LAB — FECAL LACTOFERRIN, QUANT: LACTOFERRIN: NEGATIVE

## 2013-07-22 ENCOUNTER — Encounter: Payer: Self-pay | Admitting: *Deleted

## 2013-08-17 ENCOUNTER — Encounter: Payer: Self-pay | Admitting: Internal Medicine

## 2013-08-17 ENCOUNTER — Ambulatory Visit (INDEPENDENT_AMBULATORY_CARE_PROVIDER_SITE_OTHER): Payer: Medicare Other | Admitting: Internal Medicine

## 2013-08-17 VITALS — BP 120/70 | HR 72 | Temp 97.5°F | Resp 16 | Wt 170.0 lb

## 2013-08-17 DIAGNOSIS — M810 Age-related osteoporosis without current pathological fracture: Secondary | ICD-10-CM | POA: Diagnosis not present

## 2013-08-17 DIAGNOSIS — E785 Hyperlipidemia, unspecified: Secondary | ICD-10-CM | POA: Diagnosis not present

## 2013-08-17 DIAGNOSIS — R197 Diarrhea, unspecified: Secondary | ICD-10-CM

## 2013-08-17 DIAGNOSIS — R7309 Other abnormal glucose: Secondary | ICD-10-CM

## 2013-08-17 DIAGNOSIS — I10 Essential (primary) hypertension: Secondary | ICD-10-CM | POA: Diagnosis not present

## 2013-08-17 DIAGNOSIS — R7302 Impaired glucose tolerance (oral): Secondary | ICD-10-CM

## 2013-08-17 NOTE — Assessment & Plan Note (Signed)
On Mega Red krill oil

## 2013-08-17 NOTE — Assessment & Plan Note (Signed)
Labs

## 2013-08-17 NOTE — Assessment & Plan Note (Signed)
Vit D Exercises 

## 2013-08-17 NOTE — Assessment & Plan Note (Signed)
Better  

## 2013-08-17 NOTE — Assessment & Plan Note (Signed)
Resolved off Zocor 

## 2013-08-17 NOTE — Progress Notes (Signed)
Patient ID: Diana Stout, female   DOB: 01-19-1931, 80 y.o.   MRN: [id number]   Subjective:     Diarrhea  The current episode started more than 1 month ago. The problem has been resolved. Pertinent negatives include no abdominal pain or increased  flatus. There are no known risk factors. Her past medical history is significant for irritable bowel syndrome. There is no history of inflammatory bowel disease, malabsorption or short gut syndrome.  F/u HTN, abn chest CT She torn her R rot cuff a year ago (MRI in Nov) - no surgery She is better off Zocor   F/u a dizzy spell -- resolved  BP Readings from Last 3 Encounters:  08/17/13 120/70  07/19/13 130/70  06/20/13 102/52   Wt Readings from Last 3 Encounters:  08/17/13 170 lb (77.111 kg)  07/19/13 173 lb (78.472 kg)  06/19/13 169 lb 4 oz (76.771 kg)     Review of Systems  Constitutional: Negative for activity change, appetite change, fatigue and unexpected weight change.  HENT: Negative for congestion, mouth sores and sinus pressure.   Eyes: Negative for visual disturbance.  Respiratory: Negative for chest tightness.   Gastrointestinal: Positive for diarrhea. Negative for nausea, abdominal pain and flatus.  Genitourinary: Negative for frequency, difficulty urinating and vaginal pain.  Musculoskeletal: Negative for back pain and gait problem.  Skin: Negative for pallor.  Neurological: Negative for dizziness, tremors, weakness and numbness.  Psychiatric/Behavioral: Negative for confusion and sleep disturbance.       Objective:   Physical Exam  Constitutional: She appears well-developed. No distress.  HENT:  Head: Normocephalic.  Right Ear: External ear normal.  Left Ear: External ear normal.  Nose: Nose normal.  Mouth/Throat: Oropharynx is clear and moist.  Eyes: Conjunctivae are normal. Pupils are equal, round, and reactive to light. Right eye exhibits no discharge. Left eye exhibits no discharge.  Neck: Normal range of  motion. Neck supple. No JVD present. No tracheal deviation present. No thyromegaly present.  Cardiovascular: Normal rate, regular rhythm and normal heart sounds.   Pulmonary/Chest: No stridor. No respiratory distress. She has no wheezes.  Abdominal: Soft. Bowel sounds are normal. She exhibits no distension and no mass. There is no tenderness. There is no rebound and no guarding.  Musculoskeletal: She exhibits no edema and no tenderness.  Lymphadenopathy:    She has no cervical adenopathy.  Neurological: She displays normal reflexes. No cranial nerve deficit. She exhibits normal muscle tone. Coordination normal.  Skin: No rash noted. No erythema.  Psychiatric: She has a normal mood and affect. Her behavior is normal. Judgment and thought content normal.    CT chest  Lab Results  Component Value Date   WBC 4.6 07/19/2013   HGB 12.4 07/19/2013   HCT 37.4 07/19/2013   PLT 182.0 07/19/2013   GLUCOSE 99 07/19/2013   CHOL 178 05/18/2013   TRIG 93.0 05/18/2013   HDL 46.10 05/18/2013   LDLDIRECT 154.9 05/01/2011   LDLCALC 113* 05/18/2013   ALT 13 06/19/2013   AST 21 06/19/2013   NA 142 07/19/2013   K 4.4 07/19/2013   CL 105 07/19/2013   CREATININE 0.6 07/19/2013   BUN 17 07/19/2013   CO2 29 07/19/2013   TSH 1.00 05/18/2013   HGBA1C 6.1 07/19/2013       Assessment & Plan:

## 2013-08-17 NOTE — Progress Notes (Signed)
Pre visit review using our clinic review tool, if applicable. No additional management support is needed unless otherwise documented below in the visit note. 

## 2013-08-18 ENCOUNTER — Encounter: Payer: Self-pay | Admitting: Obstetrics & Gynecology

## 2013-08-18 ENCOUNTER — Telehealth: Payer: Self-pay | Admitting: Internal Medicine

## 2013-08-18 NOTE — Telephone Encounter (Signed)
Relevant patient education assigned to patient using Emmi. ° °

## 2013-10-26 DIAGNOSIS — Z803 Family history of malignant neoplasm of breast: Secondary | ICD-10-CM | POA: Diagnosis not present

## 2013-10-26 DIAGNOSIS — Z1231 Encounter for screening mammogram for malignant neoplasm of breast: Secondary | ICD-10-CM | POA: Diagnosis not present

## 2013-10-28 ENCOUNTER — Ambulatory Visit: Payer: Medicare Other | Admitting: Obstetrics & Gynecology

## 2013-11-05 ENCOUNTER — Encounter: Payer: Self-pay | Admitting: Obstetrics & Gynecology

## 2013-11-05 ENCOUNTER — Ambulatory Visit (INDEPENDENT_AMBULATORY_CARE_PROVIDER_SITE_OTHER): Payer: Medicare Other | Admitting: Obstetrics & Gynecology

## 2013-11-05 VITALS — BP 100/62 | HR 80 | Resp 20 | Ht 58.5 in | Wt 172.6 lb

## 2013-11-05 DIAGNOSIS — Z124 Encounter for screening for malignant neoplasm of cervix: Secondary | ICD-10-CM | POA: Diagnosis not present

## 2013-11-05 DIAGNOSIS — Z01419 Encounter for gynecological examination (general) (routine) without abnormal findings: Secondary | ICD-10-CM

## 2013-11-05 NOTE — Progress Notes (Signed)
80 y.o. [id number] WidowedCaucasianF here for annual exam.  Granddaughter got married at [place] in early June.  No vaginal bleeding.  Had diarrhea episode in march and April.  Did go to the ER once when needed fluids.  Saw Dr. Alain Marion twice during this time.  Reports she felt a little "draggy" for awhile but getting her energy back now.    Patient's last menstrual period was 04/08/1996.          Sexually active: No.  The current method of family planning is post menopausal status.    Exercising: No.  not regularly Smoker:  no  Health Maintenance: Pap:  09/05/11 WNL History of abnormal Pap:  no MMG:  10/26/13 3D-normal Colonoscopy: 2/12-repeat in 7 years BMD:   2013 TDaP:  Dr Alain Marion Screening Labs: PCP, Hb today: PCP, Urine today: PCP   reports that she has never smoked. She has never used smokeless tobacco. She reports that she does not drink alcohol or use illicit drugs.  Past Medical History  Diagnosis Date  . Hypertension   . Hyperlipidemia   . DVT (deep venous thrombosis) 1998    hx of right  . GERD (gastroesophageal reflux disease)   . Osteoporosis   . Esophagitis 2006  . Palpitation   . Kidney cysts     left- Dr [name]  . Diverticulosis of colon   . IBS (irritable bowel syndrome)   . History of shingles   . Glucose intolerance (impaired glucose tolerance)   . Impaired glucose tolerance 11/22/2010  . Multiple lung nodules 2012    seen on CT scan by Dr [name], pulmonologist    Past Surgical History  Procedure Laterality Date  . Cholecystectomy    . Tubal ligation Bilateral   . Appendectomy    . Hysteroscopy with resectoscope  7/99    resect polyp    Current Outpatient Prescriptions  Medication Sig Dispense Refill  . aspirin 81 MG tablet Take 81 mg by mouth daily.        . fish oil-omega-3 fatty acids 1000 MG capsule Take 1 g by mouth daily.        Marland Kitchen olmesartan (BENICAR) 20 MG tablet Take 20 mg by mouth daily.      . cholecalciferol (VITAMIN D) 1000  UNITS tablet Take 1,000 Units by mouth daily.       No current facility-administered medications for this visit.    Family History  Problem Relation Age of Onset  . Asthma Mother   . Diabetes Father   . Cancer Father     liver  . Cancer Sister     breast  . Diabetes Sister   . Asthma Sister   . Breast cancer Sister   . Diabetes Brother   . Asthma Brother   . Diabetes Brother   . Asthma Sister   . Coronary artery disease Other     1 st degree female relative    ROS:  Pertinent items are noted in HPI.  Otherwise, a comprehensive ROS was negative.  Exam:   BP 100/62  Pulse 80  Resp 20  Ht 4' 10.5" (1.486 m)  Wt 172 lb 9.6 oz (78.291 kg)  BMI 35.45 kg/m2  LMP 04/08/1996    Height: 4' 10.5" (148.6 cm)  Ht Readings from Last 3 Encounters:  11/05/13 4' 10.5" (1.486 m)  09/29/12 4' 10.5" (1.486 m)  09/25/12 4\' 11"  (1.499 m)    General appearance: alert, cooperative and appears stated age Head: Normocephalic, without  obvious abnormality, atraumatic Neck: no adenopathy, supple, symmetrical, trachea midline and thyroid normal to inspection and palpation Lungs: clear to auscultation bilaterally Breasts: normal appearance, no masses or tenderness Heart: regular rate and rhythm Abdomen: soft, non-tender; bowel sounds normal; no masses,  no organomegaly Extremities: extremities normal, atraumatic, no cyanosis or edema Skin: Skin color, texture, turgor normal. No rashes or lesions Lymph nodes: Cervical, supraclavicular, and axillary nodes normal. No abnormal inguinal nodes palpated Neurologic: Grossly normal   Pelvic: External genitalia:  no lesions              Urethra:  normal appearing urethra with no masses, tenderness or lesions              Bartholins and Skenes: normal                 Vagina: normal appearing vagina with normal color and discharge, no lesions              Cervix: no lesions              Pap taken: Yes.   Bimanual Exam:  Uterus:  normal size, contour,  position, consistency, mobility, non-tender              Adnexa: normal adnexa and no mass, fullness, tenderness               Rectovaginal: Confirms               Anus:  normal sphincter tone, no lesions  A:  Well Woman with normal exam PMP, no HRT H/O osteopenia  P:   Mammogram yearly pap smear obtained today.  Last pap 2013. Sees Dr. Alain Marion every six months for follow-up. return annually or prn  An After Visit Summary was printed and given to the patient.

## 2013-11-11 NOTE — Addendum Note (Signed)
Addended by: Megan Salon on: 11/11/2013 09:52 PM   Modules accepted: Orders

## 2013-11-16 LAB — IPS PAP SMEAR ONLY

## 2013-12-14 ENCOUNTER — Other Ambulatory Visit (INDEPENDENT_AMBULATORY_CARE_PROVIDER_SITE_OTHER): Payer: Medicare Other

## 2013-12-14 DIAGNOSIS — E785 Hyperlipidemia, unspecified: Secondary | ICD-10-CM | POA: Diagnosis not present

## 2013-12-14 DIAGNOSIS — I10 Essential (primary) hypertension: Secondary | ICD-10-CM | POA: Diagnosis not present

## 2013-12-14 DIAGNOSIS — R7302 Impaired glucose tolerance (oral): Secondary | ICD-10-CM

## 2013-12-14 DIAGNOSIS — M810 Age-related osteoporosis without current pathological fracture: Secondary | ICD-10-CM | POA: Diagnosis not present

## 2013-12-14 DIAGNOSIS — R197 Diarrhea, unspecified: Secondary | ICD-10-CM

## 2013-12-14 DIAGNOSIS — R7309 Other abnormal glucose: Secondary | ICD-10-CM

## 2013-12-14 LAB — BASIC METABOLIC PANEL
BUN: 14 mg/dL (ref 6–23)
CO2: 26 meq/L (ref 19–32)
Calcium: 9.1 mg/dL (ref 8.4–10.5)
Chloride: 106 mEq/L (ref 96–112)
Creatinine, Ser: 0.7 mg/dL (ref 0.4–1.2)
GFR: 86.83 mL/min (ref >60.00)
GLUCOSE: 91 mg/dL (ref 70–99)
POTASSIUM: 4.2 meq/L (ref 3.5–5.1)
SODIUM: 141 meq/L (ref 135–145)

## 2013-12-14 LAB — URINALYSIS, ROUTINE W REFLEX MICROSCOPIC
BILIRUBIN URINE: NEGATIVE
HGB URINE DIPSTICK: NEGATIVE
KETONES UR: NEGATIVE
Nitrite: NEGATIVE
Specific Gravity, Urine: <=1.005 — AB (ref 1.000–1.030)
Total Protein, Urine: NEGATIVE
UROBILINOGEN UA: 0.2 (ref 0.0–1.0)
Urine Glucose: NEGATIVE
pH: 6 (ref 5.0–8.0)

## 2013-12-14 LAB — LIPID PANEL
Cholesterol: 254 mg/dL — ABNORMAL HIGH (ref 0–200)
HDL: 41.5 mg/dL (ref >39.00)
LDL Cholesterol: 181 mg/dL — ABNORMAL HIGH (ref 0–99)
NONHDL: 212.5
Total CHOL/HDL Ratio: 6
Triglycerides: 160 mg/dL — ABNORMAL HIGH (ref 0.0–149.0)
VLDL: 32 mg/dL (ref 0.0–40.0)

## 2013-12-14 LAB — TSH: TSH: 0.83 u[IU]/mL (ref 0.35–4.50)

## 2013-12-22 ENCOUNTER — Ambulatory Visit (INDEPENDENT_AMBULATORY_CARE_PROVIDER_SITE_OTHER): Payer: Medicare Other | Admitting: Internal Medicine

## 2013-12-22 ENCOUNTER — Encounter: Payer: Self-pay | Admitting: Internal Medicine

## 2013-12-22 ENCOUNTER — Telehealth: Payer: Self-pay | Admitting: Internal Medicine

## 2013-12-22 VITALS — BP 120/74 | HR 82 | Temp 97.6°F | Wt 172.0 lb

## 2013-12-22 DIAGNOSIS — Z23 Encounter for immunization: Secondary | ICD-10-CM

## 2013-12-22 DIAGNOSIS — R5383 Other fatigue: Secondary | ICD-10-CM

## 2013-12-22 DIAGNOSIS — R110 Nausea: Secondary | ICD-10-CM

## 2013-12-22 DIAGNOSIS — R0683 Snoring: Secondary | ICD-10-CM

## 2013-12-22 DIAGNOSIS — R109 Unspecified abdominal pain: Secondary | ICD-10-CM

## 2013-12-22 DIAGNOSIS — I10 Essential (primary) hypertension: Secondary | ICD-10-CM

## 2013-12-22 DIAGNOSIS — R0609 Other forms of dyspnea: Secondary | ICD-10-CM | POA: Diagnosis not present

## 2013-12-22 DIAGNOSIS — R1030 Lower abdominal pain, unspecified: Secondary | ICD-10-CM

## 2013-12-22 DIAGNOSIS — R0989 Other specified symptoms and signs involving the circulatory and respiratory systems: Secondary | ICD-10-CM | POA: Diagnosis not present

## 2013-12-22 DIAGNOSIS — R5381 Other malaise: Secondary | ICD-10-CM | POA: Diagnosis not present

## 2013-12-22 HISTORY — DX: Snoring: R06.83

## 2013-12-22 MED ORDER — POTASSIUM CHLORIDE ER 10 MEQ PO TBCR
10.0000 meq | EXTENDED_RELEASE_TABLET | Freq: Every day | ORAL | Status: DC | PRN
Start: 2013-12-22 — End: 2015-06-23

## 2013-12-22 MED ORDER — OLMESARTAN MEDOXOMIL 20 MG PO TABS: 20.0000 mg | ORAL_TABLET | Freq: Every day | ORAL | Status: DC

## 2013-12-22 MED ORDER — FUROSEMIDE 20 MG PO TABS: 20.0000 mg | ORAL_TABLET | Freq: Every day | ORAL | Status: DC | PRN

## 2013-12-22 NOTE — Progress Notes (Signed)
   Subjective:     Diarrhea  The current episode started more than 1 month ago. The problem has been resolved. Pertinent negatives include no abdominal pain or increased  flatus. There are no known risk factors. Her past medical history is significant for irritable bowel syndrome. There is no history of inflammatory bowel disease, malabsorption or short gut syndrome.  C/o snoring F/u HTN, abn chest CT She torn her R rot cuff a year ago (MRI in Nov) - no surgery She is better off Zocor   F/u a dizzy spell -- resolved  BP Readings from Last 3 Encounters:  12/22/13 120/74  11/05/13 100/62  08/17/13 120/70   Wt Readings from Last 3 Encounters:  12/22/13 172 lb (78.019 kg)  11/05/13 172 lb 9.6 oz (78.291 kg)  08/17/13 170 lb (77.111 kg)     Review of Systems  Constitutional: Negative for activity change, appetite change, fatigue and unexpected weight change.  HENT: Negative for congestion, mouth sores and sinus pressure.   Eyes: Negative for visual disturbance.  Respiratory: Negative for chest tightness.   Gastrointestinal: Positive for diarrhea. Negative for nausea, abdominal pain and flatus.  Genitourinary: Negative for frequency, difficulty urinating and vaginal pain.  Musculoskeletal: Negative for back pain and gait problem.  Skin: Negative for pallor.  Neurological: Negative for dizziness, tremors, weakness and numbness.  Psychiatric/Behavioral: Negative for confusion and sleep disturbance.       Objective:   Physical Exam  Constitutional: She appears well-developed. No distress.  HENT:  Head: Normocephalic.  Right Ear: External ear normal.  Left Ear: External ear normal.  Nose: Nose normal.  Mouth/Throat: Oropharynx is clear and moist.  Eyes: Conjunctivae are normal. Pupils are equal, round, and reactive to light. Right eye exhibits no discharge. Left eye exhibits no discharge.  Neck: Normal range of motion. Neck supple. No JVD present. No tracheal deviation  present. No thyromegaly present.  Cardiovascular: Normal rate, regular rhythm and normal heart sounds.   Pulmonary/Chest: No stridor. No respiratory distress. She has no wheezes.  Abdominal: Soft. Bowel sounds are normal. She exhibits no distension and no mass. There is no tenderness. There is no rebound and no guarding.  Musculoskeletal: She exhibits no edema and no tenderness.  Lymphadenopathy:    She has no cervical adenopathy.  Neurological: She displays normal reflexes. No cranial nerve deficit. She exhibits normal muscle tone. Coordination normal.  Skin: No rash noted. No erythema.  Psychiatric: She has a normal mood and affect. Her behavior is normal. Judgment and thought content normal.    CT chest  Lab Results  Component Value Date   WBC 4.6 07/19/2013   HGB 12.4 07/19/2013   HCT 37.4 07/19/2013   PLT 182.0 07/19/2013   GLUCOSE 91 12/14/2013   CHOL 254* 12/14/2013   TRIG 160.0* 12/14/2013   HDL 41.50 12/14/2013   LDLDIRECT 154.9 05/01/2011   LDLCALC 181* 12/14/2013   ALT 13 06/19/2013   AST 21 06/19/2013   NA 141 12/14/2013   K 4.2 12/14/2013   CL 106 12/14/2013   CREATININE 0.7 12/14/2013   BUN 14 12/14/2013   CO2 26 12/14/2013   TSH 0.83 12/14/2013   HGBA1C 6.1 07/19/2013       Assessment & Plan:

## 2013-12-22 NOTE — Assessment & Plan Note (Signed)
Continue with current prescription therapy as reflected on the Med list.  

## 2013-12-22 NOTE — Assessment & Plan Note (Signed)
2015 resolved off Zocor

## 2013-12-22 NOTE — Patient Instructions (Signed)
A foam wedge for your bed ??sleep apnea

## 2013-12-22 NOTE — Assessment & Plan Note (Signed)
Foam wedge Sleep test

## 2013-12-22 NOTE — Telephone Encounter (Signed)
Patient would like to know if she is to have blood work done for her next appt in December.

## 2013-12-22 NOTE — Progress Notes (Signed)
Pre visit review using our clinic review tool, if applicable. No additional management support is needed unless otherwise documented below in the visit note. 

## 2013-12-24 ENCOUNTER — Telehealth: Payer: Self-pay | Admitting: Internal Medicine

## 2013-12-24 NOTE — Telephone Encounter (Signed)
Left detailed mess informing pt to keep her OV as scheduled and if labs are needed, MD will let her know at that visit in December.

## 2013-12-24 NOTE — Telephone Encounter (Signed)
Can you call patient in regards to home sleep test?

## 2014-01-04 DIAGNOSIS — J209 Acute bronchitis, unspecified: Secondary | ICD-10-CM | POA: Diagnosis not present

## 2014-01-04 DIAGNOSIS — J0100 Acute maxillary sinusitis, unspecified: Secondary | ICD-10-CM | POA: Diagnosis not present

## 2014-01-04 DIAGNOSIS — J029 Acute pharyngitis, unspecified: Secondary | ICD-10-CM | POA: Diagnosis not present

## 2014-01-17 DIAGNOSIS — Z23 Encounter for immunization: Secondary | ICD-10-CM | POA: Diagnosis not present

## 2014-01-21 ENCOUNTER — Other Ambulatory Visit: Payer: Self-pay

## 2014-02-04 DIAGNOSIS — G4730 Sleep apnea, unspecified: Secondary | ICD-10-CM | POA: Diagnosis not present

## 2014-02-07 ENCOUNTER — Encounter: Payer: Self-pay | Admitting: Internal Medicine

## 2014-02-07 ENCOUNTER — Telehealth: Payer: Self-pay

## 2014-02-07 NOTE — Telephone Encounter (Signed)
Noted, spoke with pt informed her its ok" machine will pickup her sleeping .Diana Stout

## 2014-02-09 DIAGNOSIS — G4730 Sleep apnea, unspecified: Secondary | ICD-10-CM

## 2014-02-10 ENCOUNTER — Encounter: Payer: Self-pay | Admitting: Internal Medicine

## 2014-02-18 ENCOUNTER — Telehealth: Payer: Self-pay | Admitting: Internal Medicine

## 2014-02-18 NOTE — Telephone Encounter (Signed)
Pt informed of results.

## 2014-02-18 NOTE — Telephone Encounter (Signed)
Pt called in requesting to get her result for her sleep study that was 2 or 3 week ago

## 2014-03-22 ENCOUNTER — Ambulatory Visit: Payer: Medicare Other | Admitting: Internal Medicine

## 2014-03-23 ENCOUNTER — Ambulatory Visit (INDEPENDENT_AMBULATORY_CARE_PROVIDER_SITE_OTHER): Payer: Medicare Other | Admitting: Internal Medicine

## 2014-03-23 ENCOUNTER — Other Ambulatory Visit (INDEPENDENT_AMBULATORY_CARE_PROVIDER_SITE_OTHER): Payer: Medicare Other

## 2014-03-23 ENCOUNTER — Encounter: Payer: Self-pay | Admitting: Internal Medicine

## 2014-03-23 VITALS — BP 130/80 | HR 99 | Temp 98.1°F | Wt 166.0 lb

## 2014-03-23 DIAGNOSIS — R3915 Urgency of urination: Secondary | ICD-10-CM

## 2014-03-23 DIAGNOSIS — E785 Hyperlipidemia, unspecified: Secondary | ICD-10-CM | POA: Diagnosis not present

## 2014-03-23 DIAGNOSIS — I10 Essential (primary) hypertension: Secondary | ICD-10-CM

## 2014-03-23 DIAGNOSIS — K219 Gastro-esophageal reflux disease without esophagitis: Secondary | ICD-10-CM

## 2014-03-23 LAB — URINALYSIS, ROUTINE W REFLEX MICROSCOPIC
BILIRUBIN URINE: NEGATIVE
HGB URINE DIPSTICK: NEGATIVE
Ketones, ur: NEGATIVE
Nitrite: NEGATIVE
Specific Gravity, Urine: >=1.03 — AB (ref 1.000–1.030)
TOTAL PROTEIN, URINE-UPE24: NEGATIVE
URINE GLUCOSE: NEGATIVE
UROBILINOGEN UA: 0.2 (ref 0.0–1.0)
pH: 5.5 (ref 5.0–8.0)

## 2014-03-23 MED ORDER — OLMESARTAN MEDOXOMIL 20 MG PO TABS: 20.0000 mg | ORAL_TABLET | Freq: Every day | ORAL | Status: DC

## 2014-03-23 NOTE — Assessment & Plan Note (Signed)
On a low carb diet Labs

## 2014-03-23 NOTE — Progress Notes (Signed)
Pre visit review using our clinic review tool, if applicable. No additional management support is needed unless otherwise documented below in the visit note. 

## 2014-03-23 NOTE — Progress Notes (Signed)
   Subjective:     HPI  F/u ankle swelling at times, Lasix helps f/u urgency... F/u snoring F/u HTN, abn chest CT She torn her R rot cuff a year ago (MRI in Nov) - no surgery She is better off Zocor   F/u a dizzy spell -- resolved  BP Readings from Last 3 Encounters:  03/23/14 130/80  12/22/13 120/74  11/05/13 100/62   Wt Readings from Last 3 Encounters:  03/23/14 166 lb (75.297 kg)  12/22/13 172 lb (78.019 kg)  11/05/13 172 lb 9.6 oz (78.291 kg)     Review of Systems  Constitutional: Negative for activity change, appetite change, fatigue and unexpected weight change.  HENT: Negative for congestion, mouth sores and sinus pressure.   Eyes: Negative for visual disturbance.  Respiratory: Negative for chest tightness.   Gastrointestinal: Negative for nausea.  Genitourinary: Negative for frequency, difficulty urinating and vaginal pain.  Musculoskeletal: Negative for back pain and gait problem.  Skin: Negative for pallor.  Neurological: Negative for dizziness, tremors, weakness and numbness.  Psychiatric/Behavioral: Negative for confusion and sleep disturbance.       Objective:   Physical Exam  Constitutional: She appears well-developed. No distress.  HENT:  Head: Normocephalic.  Right Ear: External ear normal.  Left Ear: External ear normal.  Nose: Nose normal.  Mouth/Throat: Oropharynx is clear and moist.  Eyes: Conjunctivae are normal. Pupils are equal, round, and reactive to light. Right eye exhibits no discharge. Left eye exhibits no discharge.  Neck: Normal range of motion. Neck supple. No JVD present. No tracheal deviation present. No thyromegaly present.  Cardiovascular: Normal rate, regular rhythm and normal heart sounds.   Pulmonary/Chest: No stridor. No respiratory distress. She has no wheezes.  Abdominal: Soft. Bowel sounds are normal. She exhibits no distension and no mass. There is no tenderness. There is no rebound and no guarding.  Musculoskeletal:  She exhibits no edema or tenderness.  Lymphadenopathy:    She has no cervical adenopathy.  Neurological: She displays normal reflexes. No cranial nerve deficit. She exhibits normal muscle tone. Coordination normal.  Skin: No rash noted. No erythema.  Psychiatric: She has a normal mood and affect. Her behavior is normal. Judgment and thought content normal.  No edema  CT chest  Lab Results  Component Value Date   WBC 4.6 07/19/2013   HGB 12.4 07/19/2013   HCT 37.4 07/19/2013   PLT 182.0 07/19/2013   GLUCOSE 91 12/14/2013   CHOL 254* 12/14/2013   TRIG 160.0* 12/14/2013   HDL 41.50 12/14/2013   LDLDIRECT 154.9 05/01/2011   LDLCALC 181* 12/14/2013   ALT 13 06/19/2013   AST 21 06/19/2013   NA 141 12/14/2013   K 4.2 12/14/2013   CL 106 12/14/2013   CREATININE 0.7 12/14/2013   BUN 14 12/14/2013   CO2 26 12/14/2013   TSH 0.83 12/14/2013   HGBA1C 6.1 07/19/2013       Assessment & Plan:

## 2014-03-23 NOTE — Assessment & Plan Note (Signed)
Continue with current prescription therapy as reflected on the Med list.  

## 2014-03-24 LAB — LIPID PANEL
Cholesterol: 216 mg/dL — ABNORMAL HIGH (ref 0–200)
HDL: 38.3 mg/dL — AB (ref >39.00)
LDL Cholesterol: 160 mg/dL — ABNORMAL HIGH (ref 0–99)
NonHDL: 177.7
TRIGLYCERIDES: 87 mg/dL (ref 0.0–149.0)
Total CHOL/HDL Ratio: 6
VLDL: 17.4 mg/dL (ref 0.0–40.0)

## 2014-03-24 LAB — BASIC METABOLIC PANEL
BUN: 19 mg/dL (ref 6–23)
CO2: 26 meq/L (ref 19–32)
CREATININE: 0.7 mg/dL (ref 0.4–1.2)
Calcium: 9.3 mg/dL (ref 8.4–10.5)
Chloride: 105 mEq/L (ref 96–112)
GFR: 80.04 mL/min (ref >60.00)
Glucose, Bld: 109 mg/dL — ABNORMAL HIGH (ref 70–99)
Potassium: 4.3 mEq/L (ref 3.5–5.1)
Sodium: 140 mEq/L (ref 135–145)

## 2014-03-24 LAB — HEPATIC FUNCTION PANEL
ALBUMIN: 3.7 g/dL (ref 3.5–5.2)
ALK PHOS: 74 U/L (ref 39–117)
ALT: 17 U/L (ref 0–35)
AST: 21 U/L (ref 0–37)
Bilirubin, Direct: 0.1 mg/dL (ref 0.0–0.3)
TOTAL PROTEIN: 7.1 g/dL (ref 6.0–8.3)
Total Bilirubin: 0.6 mg/dL (ref 0.2–1.2)

## 2014-03-25 ENCOUNTER — Telehealth: Payer: Self-pay | Admitting: Internal Medicine

## 2014-03-25 NOTE — Telephone Encounter (Signed)
Pt request phone call from the assistant concern about the urine result that she got, she has a question. Please call pt

## 2014-03-28 NOTE — Telephone Encounter (Signed)
Called pt she wanted to make sure she didn't have a UTI. She had ? On the bacteria that was in urine. Answer pt ?'s she verbally understood...Johny Chess

## 2014-03-28 NOTE — Telephone Encounter (Signed)
Pt called to check up on this request. Please call pt,

## 2014-04-18 DIAGNOSIS — M47814 Spondylosis without myelopathy or radiculopathy, thoracic region: Secondary | ICD-10-CM | POA: Diagnosis not present

## 2014-04-18 DIAGNOSIS — M47812 Spondylosis without myelopathy or radiculopathy, cervical region: Secondary | ICD-10-CM | POA: Diagnosis not present

## 2014-04-19 DIAGNOSIS — J069 Acute upper respiratory infection, unspecified: Secondary | ICD-10-CM | POA: Diagnosis not present

## 2014-04-19 DIAGNOSIS — R05 Cough: Secondary | ICD-10-CM | POA: Diagnosis not present

## 2014-05-12 ENCOUNTER — Ambulatory Visit (INDEPENDENT_AMBULATORY_CARE_PROVIDER_SITE_OTHER): Payer: Medicare Other | Admitting: Internal Medicine

## 2014-05-12 ENCOUNTER — Encounter: Payer: Self-pay | Admitting: Internal Medicine

## 2014-05-12 VITALS — BP 130/86 | HR 86 | Temp 97.6°F | Wt 169.0 lb

## 2014-05-12 DIAGNOSIS — R739 Hyperglycemia, unspecified: Secondary | ICD-10-CM | POA: Diagnosis not present

## 2014-05-12 DIAGNOSIS — R002 Palpitations: Secondary | ICD-10-CM

## 2014-05-12 DIAGNOSIS — R0789 Other chest pain: Secondary | ICD-10-CM | POA: Diagnosis not present

## 2014-05-12 DIAGNOSIS — R42 Dizziness and giddiness: Secondary | ICD-10-CM | POA: Diagnosis not present

## 2014-05-12 DIAGNOSIS — I10 Essential (primary) hypertension: Secondary | ICD-10-CM

## 2014-05-12 DIAGNOSIS — M546 Pain in thoracic spine: Secondary | ICD-10-CM

## 2014-05-12 HISTORY — DX: Palpitations: R00.2

## 2014-05-12 HISTORY — DX: Pain in thoracic spine: M54.6

## 2014-05-12 LAB — GLUCOSE, POCT (MANUAL RESULT ENTRY): POC Glucose: 97 mg/dl (ref 70–99)

## 2014-05-12 MED ORDER — ATENOLOL 25 MG PO TABS: 25.0000 mg | ORAL_TABLET | Freq: Every day | ORAL | Status: DC

## 2014-05-12 NOTE — Patient Instructions (Signed)
Start Atenolol if palpitations re-occur

## 2014-05-12 NOTE — Assessment & Plan Note (Signed)
CBG 

## 2014-05-12 NOTE — Progress Notes (Signed)
Pre visit review using our clinic review tool, if applicable. No additional management support is needed unless otherwise documented below in the visit note. 

## 2014-05-12 NOTE — Assessment & Plan Note (Signed)
Continue with current prescription therapy as reflected on the Med list.  

## 2014-05-12 NOTE — Assessment & Plan Note (Addendum)
2/16 poss due to steroid pack - stopped EKG - abn. Will sch card ECHO Start Atenolol if re-occur

## 2014-05-12 NOTE — Progress Notes (Signed)
   Subjective:     HPI   C/o R CP irrad to R breast after she pulled something near R shoulder blade lifting her 20 lbs grandson in the beginning of Jan 2016 Pt saw her Ortho on 04/19/14, had X rays and was given Prednisone - 5 mg pack - 48 (Dr Alfonso Ramus). She had sweats and rapid HR while on it - better. F/u ankle swelling at times, Lasix helps  F/u snoring F/u HTN, abn chest CT She torn her R rot cuff a year ago (MRI in Nov) - no surgery She is better off Zocor   F/u a dizzy spell -- resolved  BP Readings from Last 3 Encounters:  05/12/14 130/86  03/23/14 130/80  12/22/13 120/74   Wt Readings from Last 3 Encounters:  05/12/14 169 lb (76.658 kg)  03/23/14 166 lb (75.297 kg)  12/22/13 172 lb (78.019 kg)     Review of Systems  Constitutional: Negative for activity change, appetite change, fatigue and unexpected weight change.  HENT: Negative for congestion, mouth sores and sinus pressure.   Eyes: Negative for visual disturbance.  Respiratory: Negative for chest tightness.   Gastrointestinal: Negative for nausea.  Genitourinary: Negative for frequency, difficulty urinating and vaginal pain.  Musculoskeletal: Negative for back pain and gait problem.  Skin: Negative for pallor.  Neurological: Negative for dizziness, tremors, weakness and numbness.  Psychiatric/Behavioral: Negative for confusion and sleep disturbance.       Objective:   Physical Exam  Constitutional: She appears well-developed. No distress.  HENT:  Head: Normocephalic.  Right Ear: External ear normal.  Left Ear: External ear normal.  Nose: Nose normal.  Mouth/Throat: Oropharynx is clear and moist.  Eyes: Conjunctivae are normal. Pupils are equal, round, and reactive to light. Right eye exhibits no discharge. Left eye exhibits no discharge.  Neck: Normal range of motion. Neck supple. No JVD present. No tracheal deviation present. No thyromegaly present.  Cardiovascular: Normal rate, regular rhythm and  normal heart sounds.   Pulmonary/Chest: No stridor. No respiratory distress. She has no wheezes.  Abdominal: Soft. Bowel sounds are normal. She exhibits no distension and no mass. There is no tenderness. There is no rebound and no guarding.  Musculoskeletal: She exhibits no edema or tenderness.  Lymphadenopathy:    She has no cervical adenopathy.  Neurological: She displays normal reflexes. No cranial nerve deficit. She exhibits normal muscle tone. Coordination normal.  Skin: No rash noted. No erythema.  Psychiatric: She has a normal mood and affect. Her behavior is normal. Judgment and thought content normal.  No edema Chest wall is very tender over medial edge of R shoulder blade  Lab Results  Component Value Date   WBC 4.6 07/19/2013   HGB 12.4 07/19/2013   HCT 37.4 07/19/2013   PLT 182.0 07/19/2013   GLUCOSE 109* 03/23/2014   CHOL 216* 03/23/2014   TRIG 87.0 03/23/2014   HDL 38.30* 03/23/2014   LDLDIRECT 154.9 05/01/2011   LDLCALC 160* 03/23/2014   ALT 17 03/23/2014   AST 21 03/23/2014   NA 140 03/23/2014   K 4.3 03/23/2014   CL 105 03/23/2014   CREATININE 0.7 03/23/2014   BUN 19 03/23/2014   CO2 26 03/23/2014   TSH 0.83 12/14/2013   HGBA1C 6.1 07/19/2013   EKG CBG 96    Assessment & Plan:

## 2014-05-12 NOTE — Assessment & Plan Note (Addendum)
MSK strain Trigger point inj - pt declined: she will sch PT w/Dr Minna Merritts office

## 2014-05-16 ENCOUNTER — Ambulatory Visit (HOSPITAL_COMMUNITY): Payer: Medicare Other | Attending: Internal Medicine | Admitting: Cardiology

## 2014-05-16 DIAGNOSIS — R002 Palpitations: Secondary | ICD-10-CM

## 2014-05-16 DIAGNOSIS — M546 Pain in thoracic spine: Secondary | ICD-10-CM | POA: Diagnosis not present

## 2014-05-16 DIAGNOSIS — R0789 Other chest pain: Secondary | ICD-10-CM

## 2014-05-16 DIAGNOSIS — R42 Dizziness and giddiness: Secondary | ICD-10-CM | POA: Diagnosis not present

## 2014-05-16 NOTE — Progress Notes (Signed)
Echo performed. 

## 2014-05-18 ENCOUNTER — Telehealth: Payer: Self-pay | Admitting: Internal Medicine

## 2014-05-18 NOTE — Telephone Encounter (Signed)
Patient would like to know the results of her ECHO test.

## 2014-05-19 NOTE — Telephone Encounter (Signed)
The ECHO was good Thx

## 2014-05-19 NOTE — Telephone Encounter (Signed)
Patient has called back in regards.  Patient does not understand results given through My Chart.  Patient can be reached at [phone].

## 2014-05-20 NOTE — Telephone Encounter (Signed)
Patient has questions regarding the results of her echo. She got them on mychart but has several questions. Please contact her when possible.

## 2014-05-23 MED ORDER — ATENOLOL 25 MG PO TABS: 25.0000 mg | ORAL_TABLET | Freq: Every day | ORAL | Status: DC

## 2014-05-23 NOTE — Telephone Encounter (Signed)
Pt informed of below. She request Atenolol rx. Rx sent to [facility] per pt.

## 2014-06-22 ENCOUNTER — Other Ambulatory Visit (INDEPENDENT_AMBULATORY_CARE_PROVIDER_SITE_OTHER): Payer: Medicare Other

## 2014-06-22 ENCOUNTER — Encounter: Payer: Self-pay | Admitting: Internal Medicine

## 2014-06-22 ENCOUNTER — Ambulatory Visit (INDEPENDENT_AMBULATORY_CARE_PROVIDER_SITE_OTHER): Payer: Medicare Other | Admitting: Internal Medicine

## 2014-06-22 VITALS — BP 110/60 | HR 95 | Wt 167.0 lb

## 2014-06-22 DIAGNOSIS — R5382 Chronic fatigue, unspecified: Secondary | ICD-10-CM

## 2014-06-22 DIAGNOSIS — R0789 Other chest pain: Secondary | ICD-10-CM | POA: Diagnosis not present

## 2014-06-22 HISTORY — DX: Other chest pain: R07.89

## 2014-06-22 LAB — CBC WITH DIFFERENTIAL/PLATELET
BASOS ABS: 0 10*3/uL (ref 0.0–0.1)
Basophils Relative: 0.4 % (ref 0.0–3.0)
Eosinophils Absolute: 0.1 10*3/uL (ref 0.0–0.7)
Eosinophils Relative: 1.9 % (ref 0.0–5.0)
HCT: 38.9 % (ref 36.0–46.0)
Hemoglobin: 13.1 g/dL (ref 12.0–15.0)
LYMPHS PCT: 28.1 % (ref 12.0–46.0)
Lymphs Abs: 1.6 10*3/uL (ref 0.7–4.0)
MCHC: 33.7 g/dL (ref 30.0–36.0)
MCV: 82.9 fl (ref 78.0–100.0)
MONOS PCT: 9 % (ref 3.0–12.0)
Monocytes Absolute: 0.5 10*3/uL (ref 0.1–1.0)
NEUTROS PCT: 60.6 % (ref 43.0–77.0)
Neutro Abs: 3.5 10*3/uL (ref 1.4–7.7)
PLATELETS: 197 10*3/uL (ref 150.0–400.0)
RBC: 4.69 Mil/uL (ref 3.87–5.11)
RDW: 15.6 % — ABNORMAL HIGH (ref 11.5–15.5)
WBC: 5.8 10*3/uL (ref 4.0–10.5)

## 2014-06-22 LAB — HEPATIC FUNCTION PANEL
ALK PHOS: 82 U/L (ref 39–117)
ALT: 14 U/L (ref 0–35)
AST: 18 U/L (ref 0–37)
Albumin: 4.2 g/dL (ref 3.5–5.2)
BILIRUBIN TOTAL: 0.5 mg/dL (ref 0.2–1.2)
Bilirubin, Direct: 0.1 mg/dL (ref 0.0–0.3)
Total Protein: 7.3 g/dL (ref 6.0–8.3)

## 2014-06-22 LAB — CREATININE KINASE MB: CK-MB: 1.5 ng/mL (ref 0.3–4.0)

## 2014-06-22 LAB — BASIC METABOLIC PANEL
BUN: 20 mg/dL (ref 6–23)
CHLORIDE: 103 meq/L (ref 96–112)
CO2: 31 meq/L (ref 19–32)
CREATININE: 0.75 mg/dL (ref 0.40–1.20)
Calcium: 9.4 mg/dL (ref 8.4–10.5)
GFR: 78.76 mL/min (ref >60.00)
GLUCOSE: 88 mg/dL (ref 70–99)
Potassium: 4 mEq/L (ref 3.5–5.1)
Sodium: 140 mEq/L (ref 135–145)

## 2014-06-22 LAB — TROPONIN I: TNIDX: 0 ug/l (ref 0.00–0.06)

## 2014-06-22 NOTE — Assessment & Plan Note (Signed)
Take baby aspirin 2 twice a day for now Take Atenolol 1/2 tab today and tomorrow Call 911 if pain comes back  Labs  Card cons tomorrow

## 2014-06-22 NOTE — Progress Notes (Signed)
   Subjective:     HPI   C/o CP across the chest at 1 pm at rest x 3 times x3 seconds each. C/o fatigue today  Pt felt very tired last night, rested and got over it  F/u ankle swelling at times, Lasix helps  F/u snoring F/u HTN, abn chest CT She torn her R rot cuff a year ago (MRI in Nov) - no surgery She is better off Zocor   F/u a dizzy spell -- resolved  BP Readings from Last 3 Encounters:  06/22/14 110/60  05/12/14 130/86  03/23/14 130/80   Wt Readings from Last 3 Encounters:  06/22/14 167 lb (75.751 kg)  05/12/14 169 lb (76.658 kg)  03/23/14 166 lb (75.297 kg)     Review of Systems  Constitutional: Negative for activity change, appetite change, fatigue and unexpected weight change.  HENT: Negative for congestion, mouth sores and sinus pressure.   Eyes: Negative for visual disturbance.  Respiratory: Negative for chest tightness.   Gastrointestinal: Negative for nausea.  Genitourinary: Negative for frequency, difficulty urinating and vaginal pain.  Musculoskeletal: Negative for back pain and gait problem.  Skin: Negative for pallor.  Neurological: Negative for dizziness, tremors, weakness and numbness.  Psychiatric/Behavioral: Negative for confusion and sleep disturbance.       Objective:   Physical Exam  Constitutional: She appears well-developed. No distress.  HENT:  Head: Normocephalic.  Right Ear: External ear normal.  Left Ear: External ear normal.  Nose: Nose normal.  Mouth/Throat: Oropharynx is clear and moist.  Eyes: Conjunctivae are normal. Pupils are equal, round, and reactive to light. Right eye exhibits no discharge. Left eye exhibits no discharge.  Neck: Normal range of motion. Neck supple. No JVD present. No tracheal deviation present. No thyromegaly present.  Cardiovascular: Normal rate, regular rhythm and normal heart sounds.   Pulmonary/Chest: No stridor. No respiratory distress. She has no wheezes.  Abdominal: Soft. Bowel sounds are  normal. She exhibits no distension and no mass. There is no tenderness. There is no rebound and no guarding.  Musculoskeletal: She exhibits no edema or tenderness.  Lymphadenopathy:    She has no cervical adenopathy.  Neurological: She displays normal reflexes. No cranial nerve deficit. She exhibits normal muscle tone. Coordination normal.  Skin: No rash noted. No erythema.  Psychiatric: She has a normal mood and affect. Her behavior is normal. Judgment and thought content normal.  No edema   Lab Results  Component Value Date   WBC 4.6 07/19/2013   HGB 12.4 07/19/2013   HCT 37.4 07/19/2013   PLT 182.0 07/19/2013   GLUCOSE 109* 03/23/2014   CHOL 216* 03/23/2014   TRIG 87.0 03/23/2014   HDL 38.30* 03/23/2014   LDLDIRECT 154.9 05/01/2011   LDLCALC 160* 03/23/2014   ALT 17 03/23/2014   AST 21 03/23/2014   NA 140 03/23/2014   K 4.3 03/23/2014   CL 105 03/23/2014   CREATININE 0.7 03/23/2014   BUN 19 03/23/2014   CO2 26 03/23/2014   TSH 0.83 12/14/2013   HGBA1C 6.1 07/19/2013   EKG - no change   ECHO Study Conclusions 05/16/14:  - Left ventricle: The cavity size was normal. Wall thickness was normal. Systolic function was normal. The estimated ejection fraction was in the range of 55% to 60%. Wall motion was normal; there were no regional wall motion abnormalities. Doppler parameters are consistent with abnormal left ventricular relaxation (grade 1 diastolic dysfunction).     Assessment & Plan:

## 2014-06-22 NOTE — Progress Notes (Signed)
Pre visit review using our clinic review tool, if applicable. No additional management support is needed unless otherwise documented below in the visit note. 

## 2014-06-22 NOTE — Patient Instructions (Addendum)
Take baby aspirin 2 twice a day for now Take Atenolol 1/2 tab today and tomorrow Take Prilosec 2  Today Call 911 if pain comes back

## 2014-06-24 ENCOUNTER — Encounter: Payer: Self-pay | Admitting: Internal Medicine

## 2014-06-24 ENCOUNTER — Telehealth: Payer: Self-pay | Admitting: *Deleted

## 2014-06-24 ENCOUNTER — Ambulatory Visit (INDEPENDENT_AMBULATORY_CARE_PROVIDER_SITE_OTHER): Payer: Medicare Other | Admitting: Internal Medicine

## 2014-06-24 VITALS — BP 118/72 | HR 70 | Ht 58.5 in | Wt 168.8 lb

## 2014-06-24 DIAGNOSIS — R002 Palpitations: Secondary | ICD-10-CM

## 2014-06-24 NOTE — Telephone Encounter (Signed)
Called patient on home and mobile number; no answer. I spoke w/patient's son, Louie Casa, who is listed on patient's DPR. I advised him that we have been trying to reach the patient for a cardiology appointment today. He stated he would try to reach her and have her call the cardiology office.

## 2014-06-24 NOTE — Telephone Encounter (Signed)
Dr. Harrington Challenger received DOD call at this time from Dr. Judeen Hammans office requesting patient be seen urgently for chest pain, after being seen there on 06/22/14.  Per Dr. Harrington Challenger called patient to be seen today. Unable to reach her. Left messages on home and mobile numbers to call back to be seen today/make appointment.

## 2014-06-24 NOTE — Telephone Encounter (Signed)
Patient calling back. She is not currently having chest pain. She denies any chest pain since prior to seeing PCP on Wednesday.  She has seen Dr. [name] in the past, and would like to see him if possible. Dr. Harrington Challenger and Dr. Alain Marion spoke and Dr. Alain Marion requests pt be seen today. Advised patient of this, she is agreeable to come this afternoon.  3pm.

## 2014-06-24 NOTE — Progress Notes (Signed)
Cardiology Office Note   Date:  06/24/2014   ID:  Diana Stout, DOB 1931-02-11, MRN [id number]  PCP:  Walker Kehr, MD  Cardiologist:   Dorris Carnes, MD   Chief Complaint  Patient presents with  . Fatigue      History of Present Illness: Diana Stout is a 81 y.o. female referred for CP and fatigue She had CP pinch L to R chest Seconds  Did it again  Three times total then gone  No chest pressure   Breathing OK  Not as active as used to be  NO regular waking  Did work in in yard last weekend for 30 min  Palpitations in feb  Occurred after prednisone    Still comes on intermitt  Last 5 min  Started in January  Not as often as when they started   Has no energy    SLeep study in past  Mild sleep apnea  REcom trial of CPAP or dental appliance    Current Outpatient Prescriptions  Medication Sig Dispense Refill  . aspirin 81 MG tablet Take 81 mg by mouth daily.      Marland Kitchen atenolol (TENORMIN) 25 MG tablet Take 12.5 mg by mouth continuous as needed (patient taking one half of a 25 mg tablet (12.5 mg) as needed for heart palpations).    . fish oil-omega-3 fatty acids 1000 MG capsule Take 1 g by mouth daily.      . furosemide (LASIX) 20 MG tablet Take 1 tablet (20 mg total) by mouth daily as needed. 30 tablet 3  . olmesartan (BENICAR) 20 MG tablet Take 1 tablet (20 mg total) by mouth daily. 30 tablet 11  . potassium chloride (KLOR-CON 10) 10 MEQ tablet Take 1 tablet (10 mEq total) by mouth daily as needed. Take w/Furosemide 30 tablet 3   No current facility-administered medications for this visit.    Allergies:   Atorvastatin; Alendronate sodium; Ceftriaxone sodium; Codeine phosphate; Moxifloxacin; and Simvastatin   Past Medical History  Diagnosis Date  . Hypertension   . Hyperlipidemia   . DVT (deep venous thrombosis) 1998    hx of right  . GERD (gastroesophageal reflux disease)   . Osteoporosis   . Esophagitis 2006  . Palpitation   . Kidney cysts     left- Dr  [name]  . Diverticulosis of colon   . IBS (irritable bowel syndrome)   . History of shingles   . Glucose intolerance (impaired glucose tolerance)   . Impaired glucose tolerance 11/22/2010  . Multiple lung nodules 2012    seen on CT scan by Dr [name], pulmonologist    Past Surgical History  Procedure Laterality Date  . Cholecystectomy    . Tubal ligation Bilateral   . Appendectomy    . Hysteroscopy with resectoscope  7/99    resect polyp     Social History:  The patient  reports that she has never smoked. She has never used smokeless tobacco. She reports that she does not drink alcohol or use illicit drugs.   Family History:  The patient's family history includes Asthma in her brother, mother, sister, and sister; Breast cancer in her sister; Cancer in her father and sister; Coronary artery disease in her other; Diabetes in her brother, brother, father, and sister.    ROS:  Please see the history of present illness. All other systems are reviewed and  Negative to the above problem except as noted.    PHYSICAL EXAM: VS:  BP  118/72 mmHg  Pulse 70  Ht 4' 10.5" (1.486 m)  Wt 168 lb 12.8 oz (76.567 kg)  BMI 34.67 kg/m2  LMP 04/08/1996  GEN: Well nourished, well developed, in no acute distress HEENT: normal Neck: no JVD, carotid bruits, or masses Cardiac: RRR; no murmurs, rubs, or gallops,no edema  Respiratory:  clear to auscultation bilaterally, normal work of breathing GI: soft, nontender, nondistended, + BS  No hepatomegaly  MS: no deformity Moving all extremities   Skin: warm and dry, no rash Neuro:  Strength and sensation are intact Psych: euthymic mood, full affect   EKG:  EKG is not ordered today.  On 3/16:  SR 95 bpm     Lipid Panel    Component Value Date/Time   CHOL 216* 03/23/2014 1554   TRIG 87.0 03/23/2014 1554   TRIG 145 03/03/2006 1154   HDL 38.30* 03/23/2014 1554   CHOLHDL 6 03/23/2014 1554   CHOLHDL 4.4 CALC 03/03/2006 1154   VLDL 17.4 03/23/2014  1554   LDLCALC 160* 03/23/2014 1554   LDLDIRECT 154.9 05/01/2011 1030      Wt Readings from Last 3 Encounters:  06/24/14 168 lb 12.8 oz (76.567 kg)  06/22/14 167 lb (75.751 kg)  05/12/14 169 lb (76.658 kg)      ASSESSMENT AND PLAN:  1.  CP  Atypical  I do not think cardiace  2.  Palpitations  Concerning for poss afib  Will set up for event monitor.    3.  HL  Last LDL high  Discussed diet (sat fat)  WOuld try to amend, lose wt.  CT from 2014 (abdominal) did not show atherosclerosis.     Current medicines are reviewed at length with the patient today.  The patient does not have concerns regarding medicines.  The following changes have been made: None  Labs/ tests ordered today include:  Event monitor No orders of the defined types were placed in this encounter.     Disposition:   FU with me depends on test resutls    Signed, Dorris Carnes, MD  06/24/2014 3:59 PM    [facility] [address], [place], [place]  [address] Phone: [phone]; Fax: [phone]

## 2014-06-24 NOTE — Patient Instructions (Signed)
Your physician recommends that you continue on your current medications as directed. Please refer to the Current Medication list given to you today. Your physician has recommended that you wear an event monitor. Event monitors are medical devices that record the heart's electrical activity. Doctors most often us these monitors to diagnose arrhythmias. Arrhythmias are problems with the speed or rhythm of the heartbeat. The monitor is a small, portable device. You can wear one while you do your normal daily activities. This is usually used to diagnose what is causing palpitations/syncope (passing out).  Follow up with your physician will depend on test results.   

## 2014-06-25 ENCOUNTER — Encounter: Payer: Self-pay | Admitting: Internal Medicine

## 2014-06-25 NOTE — Assessment & Plan Note (Signed)
R/o fatigue due to CAD Card ref

## 2014-06-29 ENCOUNTER — Encounter: Payer: Self-pay | Admitting: Radiology

## 2014-06-29 ENCOUNTER — Encounter (INDEPENDENT_AMBULATORY_CARE_PROVIDER_SITE_OTHER): Payer: Medicare Other

## 2014-06-29 DIAGNOSIS — R002 Palpitations: Secondary | ICD-10-CM | POA: Diagnosis not present

## 2014-06-29 NOTE — Progress Notes (Signed)
Patient ID: Diana Stout, female   DOB: [birth year], 81 y.o.   MRN: [id number]  [employer] 30 day monitor applied. EOS-07-29-14

## 2014-06-30 ENCOUNTER — Telehealth: Payer: Self-pay | Admitting: Internal Medicine

## 2014-06-30 ENCOUNTER — Telehealth: Payer: Self-pay | Admitting: *Deleted

## 2014-06-30 NOTE — Telephone Encounter (Signed)
Patient requests prescription for Diana Stout for her sleep apnea as she states discussed with Dr. Harrington Challenger at last OV. She is aware I will send a message to Dr. Harrington Challenger and call her back when prescription is ready.  She will pick it up.

## 2014-06-30 NOTE — Telephone Encounter (Signed)
Walk in pt form " pt needs rx" gave to [facility]

## 2014-06-30 NOTE — Telephone Encounter (Signed)
OK to prescribe 

## 2014-07-11 NOTE — Telephone Encounter (Signed)
Called patient.  Advised I will mail prescription for Snore Guard to her home. She is appreciative for the call and help.

## 2014-07-21 ENCOUNTER — Other Ambulatory Visit: Payer: Self-pay | Admitting: Dermatology

## 2014-07-21 DIAGNOSIS — D485 Neoplasm of uncertain behavior of skin: Secondary | ICD-10-CM | POA: Diagnosis not present

## 2014-07-21 DIAGNOSIS — L820 Inflamed seborrheic keratosis: Secondary | ICD-10-CM | POA: Diagnosis not present

## 2014-07-21 DIAGNOSIS — L570 Actinic keratosis: Secondary | ICD-10-CM | POA: Diagnosis not present

## 2014-07-21 DIAGNOSIS — Z85828 Personal history of other malignant neoplasm of skin: Secondary | ICD-10-CM | POA: Diagnosis not present

## 2014-07-21 DIAGNOSIS — D1801 Hemangioma of skin and subcutaneous tissue: Secondary | ICD-10-CM | POA: Diagnosis not present

## 2014-07-21 DIAGNOSIS — L821 Other seborrheic keratosis: Secondary | ICD-10-CM | POA: Diagnosis not present

## 2014-07-21 DIAGNOSIS — D225 Melanocytic nevi of trunk: Secondary | ICD-10-CM | POA: Diagnosis not present

## 2014-07-21 DIAGNOSIS — D0439 Carcinoma in situ of skin of other parts of face: Secondary | ICD-10-CM | POA: Diagnosis not present

## 2014-07-21 DIAGNOSIS — C4441 Basal cell carcinoma of skin of scalp and neck: Secondary | ICD-10-CM | POA: Diagnosis not present

## 2014-07-21 DIAGNOSIS — L814 Other melanin hyperpigmentation: Secondary | ICD-10-CM | POA: Diagnosis not present

## 2014-08-02 ENCOUNTER — Telehealth: Payer: Self-pay | Admitting: Internal Medicine

## 2014-08-02 NOTE — Telephone Encounter (Signed)
Pt is aware that the monitor show that it is still in progress. So when Dr. Harrington Challenger get all the monitor information and review it; we will let her know the results. Pt verbalized understanding.

## 2014-08-02 NOTE — Telephone Encounter (Signed)
New Message      Pt calling wanting to get results from monitor that she recently wore. Please call back and advise.

## 2014-08-05 ENCOUNTER — Telehealth: Payer: Self-pay | Admitting: *Deleted

## 2014-08-05 DIAGNOSIS — D0439 Carcinoma in situ of skin of other parts of face: Secondary | ICD-10-CM | POA: Diagnosis not present

## 2014-08-05 DIAGNOSIS — C4441 Basal cell carcinoma of skin of scalp and neck: Secondary | ICD-10-CM | POA: Diagnosis not present

## 2014-08-05 DIAGNOSIS — Z85828 Personal history of other malignant neoplasm of skin: Secondary | ICD-10-CM | POA: Diagnosis not present

## 2014-08-05 DIAGNOSIS — L820 Inflamed seborrheic keratosis: Secondary | ICD-10-CM | POA: Diagnosis not present

## 2014-08-05 NOTE — Telephone Encounter (Signed)
Received end of service report for event monitor. Placed in Dr. Alan Ripper folder for her review.

## 2014-08-12 ENCOUNTER — Telehealth: Payer: Self-pay | Admitting: Internal Medicine

## 2014-08-12 NOTE — Telephone Encounter (Signed)
Lmtcb regarding monitor

## 2014-08-12 NOTE — Telephone Encounter (Signed)
New message      Want monitor results or talk to someone today.  She says she has waited a week and want an update today.

## 2014-08-15 NOTE — Telephone Encounter (Signed)
Left message on machine for pt to contact the office for monitor results.

## 2014-08-15 NOTE — Telephone Encounter (Signed)
Patient called back.  Per Dr. Harrington Challenger rec after reviewing monitor "no arrhythmia detected, if she is feeling palpitations,  Could try decreasing benicar to 10 mg and increasing atenolol to 25 mg."  Pt is not feeling any palpitations; however--she is currently not taking any atenolol, and taking 1/2 of benicar 20 mg.  Updated medicine list to reflect this. She monitors her BP at home and it is usually 110s-120s.  Adv patient to call back if BP is up consistently or if palpitations become bothersome and Dr. Harrington Challenger will address at that time. Pt verbalizes understanding.

## 2014-10-19 DIAGNOSIS — H903 Sensorineural hearing loss, bilateral: Secondary | ICD-10-CM | POA: Diagnosis not present

## 2014-10-19 DIAGNOSIS — R42 Dizziness and giddiness: Secondary | ICD-10-CM | POA: Diagnosis not present

## 2014-10-19 DIAGNOSIS — J310 Chronic rhinitis: Secondary | ICD-10-CM | POA: Diagnosis not present

## 2014-11-08 ENCOUNTER — Ambulatory Visit (INDEPENDENT_AMBULATORY_CARE_PROVIDER_SITE_OTHER): Payer: Medicare Other | Admitting: Obstetrics & Gynecology

## 2014-11-08 ENCOUNTER — Encounter: Payer: Self-pay | Admitting: Obstetrics & Gynecology

## 2014-11-08 VITALS — BP 122/78 | HR 88 | Resp 16 | Ht <= 58 in | Wt 171.0 lb

## 2014-11-08 DIAGNOSIS — Z01419 Encounter for gynecological examination (general) (routine) without abnormal findings: Secondary | ICD-10-CM | POA: Diagnosis not present

## 2014-11-08 NOTE — Progress Notes (Signed)
Patient ID: Diana Stout, female   DOB: 08/29/1930, 81 y.o.   MRN: [id number]   81 y.o. [id number] WidowedCaucasianF here for annual exam.  Has some palpitations earlier this year.  Was on a monitor.  Dr. Harrington Challenger following.  Monitoring was negative.  Pt reports this has resolved.  Also had a sleep study.    Denies vaginal bleeding.     Patient's last menstrual period was 04/08/1996.          Sexually active: No.  The current method of family planning is post menopausal status.    Exercising: No.  The patient does not participate in regular exercise at present. Smoker:  no  Health Maintenance: Pap:  10-26-13 WNL  History of abnormal Pap:  no MMG:  10-26-13 WNL BI Rads 1 [facility] Colonoscopy:  05-29-10 Polyps- repeat in 10 years BMD:   09-07-11, -1.8 TD: 12-22-13  Screening Labs: done with Dr. Harrington Challenger and Dr. Oletha Blend this year    reports that she has never smoked. She has never used smokeless tobacco. She reports that she does not drink alcohol or use illicit drugs.  Past Medical History  Diagnosis Date  . Hypertension   . Hyperlipidemia   . DVT (deep venous thrombosis) 1998    hx of right  . GERD (gastroesophageal reflux disease)   . Osteoporosis   . Esophagitis 2006  . Palpitation   . Kidney cysts     left- Dr [name]  . Diverticulosis of colon   . IBS (irritable bowel syndrome)   . History of shingles   . Glucose intolerance (impaired glucose tolerance)   . Impaired glucose tolerance 11/22/2010  . Multiple lung nodules 2012    seen on CT scan by Dr [name], pulmonologist    Past Surgical History  Procedure Laterality Date  . Cholecystectomy    . Tubal ligation Bilateral   . Appendectomy    . Hysteroscopy with resectoscope  7/99    resect polyp    Current Outpatient Prescriptions  Medication Sig Dispense Refill  . aspirin 81 MG tablet Take 81 mg by mouth daily.      Marland Kitchen atenolol (TENORMIN) 25 MG tablet Take 12.5 mg by mouth continuous as needed (patient taking one half of a 25 mg  tablet (12.5 mg) as needed for heart palpations).    . fish oil-omega-3 fatty acids 1000 MG capsule Take 1 g by mouth daily.      . furosemide (LASIX) 20 MG tablet Take 1 tablet (20 mg total) by mouth daily as needed. 30 tablet 3  . olmesartan (BENICAR) 20 MG tablet Take 1 tablet (20 mg total) by mouth daily. (Patient taking differently: Take 10 mg by mouth daily. ) 30 tablet 11  . potassium chloride (KLOR-CON 10) 10 MEQ tablet Take 1 tablet (10 mEq total) by mouth daily as needed. Take w/Furosemide 30 tablet 3   No current facility-administered medications for this visit.    Family History  Problem Relation Age of Onset  . Asthma Mother   . Diabetes Father   . Cancer Father     liver  . Cancer Sister     breast  . Diabetes Sister   . Asthma Sister   . Breast cancer Sister   . Diabetes Brother   . Asthma Brother   . Diabetes Brother   . Asthma Sister   . Coronary artery disease Other     1 st degree female relative    ROS:  Pertinent items are  noted in HPI.  Otherwise, a comprehensive ROS was negative.  Exam:   BP 122/78 mmHg  Pulse 88  Resp 16  Ht 4\' 10"  (1.473 m)  Wt 171 lb (77.565 kg)  BMI 35.75 kg/m2  LMP 04/08/1996  Weight change: -1#  Height: 4\' 10"  (147.3 cm)  Ht Readings from Last 3 Encounters:  11/08/14 4\' 10"  (1.473 m)  06/24/14 4' 10.5" (1.486 m)  11/05/13 4' 10.5" (1.486 m)    General appearance: alert, cooperative and appears stated age Head: Normocephalic, without obvious abnormality, atraumatic Neck: no adenopathy, supple, symmetrical, trachea midline and thyroid normal to inspection and palpation Lungs: clear to auscultation bilaterally Breasts: normal appearance, no masses or tenderness Heart: regular rate and rhythm Abdomen: soft, non-tender; bowel sounds normal; no masses,  no organomegaly Extremities: extremities normal, atraumatic, no cyanosis or edema Skin: Skin color, texture, turgor normal. No rashes or lesions Lymph nodes: Cervical,  supraclavicular, and axillary nodes normal. No abnormal inguinal nodes palpated Neurologic: Grossly normal   Pelvic: External genitalia:  no lesions              Urethra:  normal appearing urethra with no masses, tenderness or lesions              Bartholins and Skenes: normal                 Vagina: normal appearing vagina with normal color and discharge, no lesions, third degree cystocele noted              Cervix: no lesions              Pap taken: Yes.   Bimanual Exam:  Uterus:  normal size, contour, position, consistency, mobility, non-tender , second degree uterine prolapse               Adnexa: normal adnexa and no mass, fullness, tenderness               Rectovaginal: Confirms               Anus:  normal sphincter tone, no lesions  Chaperone was present for exam.  A:  Well Woman with normal exam PMP, no HRT H/O osteopenia Cystocele and uterine prolapse, stable Palpitations earlier this year which have resolved  P: Mammogram yearly pap smear obtained 2015.  No pap today. Sees Dr. Alain Marion every six months for follow-up. return annually or prn

## 2014-11-17 DIAGNOSIS — Z1231 Encounter for screening mammogram for malignant neoplasm of breast: Secondary | ICD-10-CM | POA: Diagnosis not present

## 2014-11-17 DIAGNOSIS — Z8262 Family history of osteoporosis: Secondary | ICD-10-CM | POA: Diagnosis not present

## 2014-11-28 ENCOUNTER — Telehealth: Payer: Self-pay

## 2014-11-28 NOTE — Telephone Encounter (Signed)
Lmtcb//kn 

## 2014-11-29 NOTE — Telephone Encounter (Signed)
Patient is returning a call to Kelly °

## 2014-11-29 NOTE — Telephone Encounter (Signed)
Returned appointment.

## 2014-11-30 DIAGNOSIS — Z85828 Personal history of other malignant neoplasm of skin: Secondary | ICD-10-CM | POA: Diagnosis not present

## 2014-11-30 DIAGNOSIS — L570 Actinic keratosis: Secondary | ICD-10-CM | POA: Diagnosis not present

## 2014-11-30 DIAGNOSIS — L820 Inflamed seborrheic keratosis: Secondary | ICD-10-CM | POA: Diagnosis not present

## 2014-12-05 NOTE — Telephone Encounter (Signed)
Patient notified of [facility] BMD results. Copy will be scanned in epic.//kn 

## 2014-12-08 ENCOUNTER — Encounter: Payer: Self-pay | Admitting: Internal Medicine

## 2014-12-08 ENCOUNTER — Ambulatory Visit (INDEPENDENT_AMBULATORY_CARE_PROVIDER_SITE_OTHER)
Admission: RE | Admit: 2014-12-08 | Discharge: 2014-12-08 | Disposition: A | Discharge status: Home / Self Care | Payer: Medicare Other | Source: Ambulatory Visit | Attending: Internal Medicine | Admitting: Internal Medicine

## 2014-12-08 ENCOUNTER — Ambulatory Visit (INDEPENDENT_AMBULATORY_CARE_PROVIDER_SITE_OTHER): Payer: Medicare Other | Admitting: Internal Medicine

## 2014-12-08 VITALS — BP 118/82 | HR 115 | Temp 98.4°F | Ht 59.0 in | Wt 169.0 lb

## 2014-12-08 DIAGNOSIS — R7302 Impaired glucose tolerance (oral): Secondary | ICD-10-CM

## 2014-12-08 DIAGNOSIS — R062 Wheezing: Secondary | ICD-10-CM

## 2014-12-08 DIAGNOSIS — R05 Cough: Secondary | ICD-10-CM

## 2014-12-08 DIAGNOSIS — R058 Other specified cough: Secondary | ICD-10-CM | POA: Insufficient documentation

## 2014-12-08 DIAGNOSIS — R059 Cough, unspecified: Secondary | ICD-10-CM

## 2014-12-08 DIAGNOSIS — I10 Essential (primary) hypertension: Secondary | ICD-10-CM

## 2014-12-08 HISTORY — DX: Other specified cough: R05.8

## 2014-12-08 HISTORY — DX: Wheezing: R06.2

## 2014-12-08 MED ORDER — HYDROCODONE-HOMATROPINE 5-1.5 MG/5ML PO SYRP: 5.0000 mL | ORAL_SOLUTION | Freq: Four times a day (QID) | ORAL | Status: DC | PRN

## 2014-12-08 MED ORDER — LEVOFLOXACIN 250 MG PO TABS: 250.0000 mg | ORAL_TABLET | Freq: Every day | ORAL | Status: DC

## 2014-12-08 MED ORDER — PREDNISONE 10 MG PO TABS: ORAL_TABLET | ORAL | Status: DC

## 2014-12-08 NOTE — Progress Notes (Signed)
Subjective:    Patient ID: Diana Stout, female    DOB: 07-Jan-1931, 81 y.o.   MRN: [id number]  HPI  Here with acute onset mild to mod 2-3 days ST, HA, general weakness and malaise, with prod cough greenish sputum, but Pt denies chest pain, increased sob or doe, wheezing, orthopnea, PND, increased LE swelling, palpitations, dizziness or syncope, except for onset wheezing last PM.  Pt denies new neurological symptoms such as new headache, or facial or extremity weakness or numbness   Pt denies polydipsia, polyuria Past Medical History  Diagnosis Date  . Hypertension   . Hyperlipidemia   . DVT (deep venous thrombosis) 1998    hx of right  . GERD (gastroesophageal reflux disease)   . Osteoporosis   . Esophagitis 2006  . Palpitation   . Kidney cysts     left- Dr [name]  . Diverticulosis of colon   . IBS (irritable bowel syndrome)   . History of shingles   . Glucose intolerance (impaired glucose tolerance)   . Impaired glucose tolerance 11/22/2010  . Multiple lung nodules 2012    seen on CT scan by Dr [name], pulmonologist   Past Surgical History  Procedure Laterality Date  . Cholecystectomy    . Tubal ligation Bilateral   . Appendectomy    . Hysteroscopy with resectoscope  7/99    resect polyp    reports that she has never smoked. She has never used smokeless tobacco. She reports that she does not drink alcohol or use illicit drugs. family history includes Asthma in her brother, mother, sister, and sister; Breast cancer in her sister; Cancer in her father and sister; Coronary artery disease in her other; Diabetes in her brother, brother, father, and sister; Thyroid disease in her maternal uncle. Allergies  Allergen Reactions  . Atorvastatin Nausea And Vomiting    REACTION: Gi pain  . Alendronate Sodium Other (See Comments)    REACTION: esophagus problem  . Ceftriaxone Sodium Rash    REACTION: rash  . Codeine Phosphate Other (See Comments)    REACTION: nightmares  .  Moxifloxacin Palpitations    REACTION: nausea/chest pain She can take Cipro  . Simvastatin Other (See Comments)    indigestion   Current Outpatient Prescriptions on File Prior to Visit  Medication Sig Dispense Refill  . aspirin 81 MG tablet Take 81 mg by mouth daily.      . cholecalciferol (VITAMIN D) 1000 UNITS tablet Take 1,000 Units by mouth daily.    . fish oil-omega-3 fatty acids 1000 MG capsule Take 1 g by mouth daily.      . furosemide (LASIX) 20 MG tablet Take 1 tablet (20 mg total) by mouth daily as needed. 30 tablet 3  . olmesartan (BENICAR) 20 MG tablet Take 1 tablet (20 mg total) by mouth daily. (Patient taking differently: Take 10 mg by mouth daily. ) 30 tablet 11  . potassium chloride (KLOR-CON 10) 10 MEQ tablet Take 1 tablet (10 mEq total) by mouth daily as needed. Take w/Furosemide 30 tablet 3   No current facility-administered medications on file prior to visit.   Review of Systems  Constitutional: Negative for unusual diaphoresis or night sweats HENT: Negative for ringing in ear or discharge Eyes: Negative for double vision or worsening visual disturbance.  Respiratory: Negative for choking and stridor.   Gastrointestinal: Negative for vomiting or other signifcant bowel change Genitourinary: Negative for hematuria or change in urine volume.  Musculoskeletal: Negative for other MSK pain or  swelling Skin: Negative for color change and worsening wound.  Neurological: Negative for tremors and numbness other than noted  Psychiatric/Behavioral: Negative for decreased concentration or agitation other than above       Objective:   Physical Exam BP 118/82 mmHg  Pulse 115  Temp(Src) 98.4 F (36.9 C) (Oral)  Ht 4\' 11"  (1.499 m)  Wt 169 lb (76.658 kg)  BMI 34.12 kg/m2  SpO2 94%  LMP 04/08/1996 VS noted,  Constitutional: Pt appears in no significant distress HENT: Head: NCAT.  Right Ear: External ear normal.  Left Ear: External ear normal.  Eyes: . Pupils are equal,  round, and reactive to light. Conjunctivae and EOM are normal Neck: Normal range of motion. Neck supple.  Cardiovascular: Normal rate and regular rhythm.   Pulmonary/Chest: Effort normal and breath sounds decreased with few bilat wheeze and few right > left basilar crackles (?chronic).  Neurological: Pt is alert. Not confused , motor grossly intact Skin: Skin is warm. No rash, no LE edema Psychiatric: Pt behavior is normal. No agitation.         Assessment & Plan:

## 2014-12-08 NOTE — Assessment & Plan Note (Signed)
stable overall by history and exam, recent data reviewed with pt, and pt to continue medical treatment as before,  to f/u any worsening symptoms or concerns BP Readings from Last 3 Encounters:  12/08/14 118/82  11/08/14 122/78  06/24/14 118/72

## 2014-12-08 NOTE — Assessment & Plan Note (Signed)
stable overall by history and exam, recent data reviewed with pt, and pt to continue medical treatment as before,  to f/u any worsening symptoms or concerns, pt to call for onset polys or cbg > 200 with steroid tx Lab Results  Component Value Date   HGBA1C 6.1 07/19/2013

## 2014-12-08 NOTE — Assessment & Plan Note (Signed)
Mild to mod, for low dose short course prednisone,  to f/u any worsening symptoms or concerns

## 2014-12-08 NOTE — Progress Notes (Signed)
Pre visit review using our clinic review tool, if applicable. No additional management support is needed unless otherwise documented below in the visit note. 

## 2014-12-08 NOTE — Assessment & Plan Note (Signed)
C/w bronchitis, cant r/o pna, for cxr, Mild to mod, for antibx course,  to f/u any worsening symptoms or concerns

## 2014-12-08 NOTE — Patient Instructions (Addendum)
Please take all new medication as prescribed - the antibiotic, cough medicine as needed, and the prednisone  Please continue all other medications as before  Please have the pharmacy call with any other refills you may need.  Please keep your appointments with your specialists as you may have planned  Please go to the XRAY Department in the Basement (go straight as you get off the elevator) for the x-ray testing  You will be contacted by phone if any changes need to be made immediately.  Otherwise, you will receive a letter about your results with an explanation, but please check with MyChart first.  Please remember to sign up for MyChart if you have not done so, as this will be important to you in the future with finding out test results, communicating by private email, and scheduling acute appointments online when needed.

## 2014-12-16 ENCOUNTER — Encounter: Payer: Self-pay | Admitting: Internal Medicine

## 2014-12-16 ENCOUNTER — Ambulatory Visit (INDEPENDENT_AMBULATORY_CARE_PROVIDER_SITE_OTHER): Payer: Medicare Other | Admitting: Internal Medicine

## 2014-12-16 VITALS — BP 134/80 | HR 86 | Temp 98.6°F

## 2014-12-16 DIAGNOSIS — J069 Acute upper respiratory infection, unspecified: Secondary | ICD-10-CM | POA: Diagnosis not present

## 2014-12-16 DIAGNOSIS — I10 Essential (primary) hypertension: Secondary | ICD-10-CM | POA: Diagnosis not present

## 2014-12-16 MED ORDER — BENZONATATE 200 MG PO CAPS: 200.0000 mg | ORAL_CAPSULE | Freq: Two times a day (BID) | ORAL | Status: DC | PRN

## 2014-12-16 NOTE — Assessment & Plan Note (Signed)
Finish Levaquin [facility], Mucinex CXR ok

## 2014-12-16 NOTE — Progress Notes (Signed)
Subjective:  Patient ID: Diana Stout, female    DOB: 1930-04-18  Age: 81 y.o. MRN: [id number]  CC: No chief complaint on file.   HPI Diana Stout presents for URI - deep cough x 2 weeks, fever  Outpatient Prescriptions Prior to Visit  Medication Sig Dispense Refill  . aspirin 81 MG tablet Take 81 mg by mouth daily.      . cholecalciferol (VITAMIN D) 1000 UNITS tablet Take 1,000 Units by mouth daily.    . fish oil-omega-3 fatty acids 1000 MG capsule Take 1 g by mouth daily.      . furosemide (LASIX) 20 MG tablet Take 1 tablet (20 mg total) by mouth daily as needed. 30 tablet 3  . HYDROcodone-homatropine (HYCODAN) 5-1.5 MG/5ML syrup Take 5 mLs by mouth every 6 (six) hours as needed for cough. 180 mL 0  . levofloxacin (LEVAQUIN) 250 MG tablet Take 1 tablet (250 mg total) by mouth daily. 10 tablet 0  . olmesartan (BENICAR) 20 MG tablet Take 1 tablet (20 mg total) by mouth daily. (Patient taking differently: Take 10 mg by mouth daily. ) 30 tablet 11  . potassium chloride (KLOR-CON 10) 10 MEQ tablet Take 1 tablet (10 mEq total) by mouth daily as needed. Take w/Furosemide 30 tablet 3  . predniSONE (DELTASONE) 10 MG tablet 1 tabs by mouth per day for 7 days 7 tablet 0   No facility-administered medications prior to visit.    ROS Review of Systems  Constitutional: Negative for fever and fatigue.  Respiratory: Positive for cough and wheezing. Negative for shortness of breath.   Musculoskeletal: Negative for joint swelling and arthralgias.    Objective:  BP 134/80 mmHg  Pulse 86  Temp(Src) 98.6 F (37 C) (Oral)  SpO2 96%  LMP 04/08/1996  BP Readings from Last 3 Encounters:  12/16/14 134/80  12/08/14 118/82  11/08/14 122/78    Wt Readings from Last 3 Encounters:  12/08/14 169 lb (76.658 kg)  11/08/14 171 lb (77.565 kg)  06/24/14 168 lb 12.8 oz (76.567 kg)    Physical Exam  Constitutional: She appears well-developed. No distress.  HENT:  Head: Normocephalic.  Right  Ear: External ear normal.  Left Ear: External ear normal.  Nose: Nose normal.  Mouth/Throat: Oropharynx is clear and moist.  Eyes: Conjunctivae are normal. Pupils are equal, round, and reactive to light. Right eye exhibits no discharge. Left eye exhibits no discharge.  Neck: Normal range of motion. Neck supple. No JVD present. No tracheal deviation present. No thyromegaly present.  Cardiovascular: Normal rate, regular rhythm and normal heart sounds.   Pulmonary/Chest: No stridor. No respiratory distress. She has no wheezes. She exhibits no tenderness.  Abdominal: Soft. Bowel sounds are normal. She exhibits no distension and no mass. There is no tenderness. There is no rebound and no guarding.  Musculoskeletal: She exhibits no edema or tenderness.  Lymphadenopathy:    She has no cervical adenopathy.  Neurological: She displays normal reflexes. No cranial nerve deficit. She exhibits normal muscle tone. Coordination normal.  Skin: No rash noted. No erythema.  Psychiatric: She has a normal mood and affect. Her behavior is normal. Judgment and thought content normal.  loud cough  Lab Results  Component Value Date   WBC 5.8 06/22/2014   HGB 13.1 06/22/2014   HCT 38.9 06/22/2014   PLT 197.0 06/22/2014   GLUCOSE 88 06/22/2014   CHOL 216* 03/23/2014   TRIG 87.0 03/23/2014   HDL 38.30* 03/23/2014   LDLDIRECT 154.9  05/01/2011   LDLCALC 160* 03/23/2014   ALT 14 06/22/2014   AST 18 06/22/2014   NA 140 06/22/2014   K 4.0 06/22/2014   CL 103 06/22/2014   CREATININE 0.75 06/22/2014   BUN 20 06/22/2014   CO2 31 06/22/2014   TSH 0.83 12/14/2013   HGBA1C 6.1 07/19/2013    Dg Chest 2 View  12/09/2014   CLINICAL DATA:  Cough, wheezing for 2 weeks  EXAM: CHEST  2 VIEW  COMPARISON:  Chest x-ray of 08/01/2009 and CT chest of 05/08/2012  FINDINGS: No active infiltrate or effusion is seen. Minimally prominent markings at the left lung base may represent atelectasis or scarring. Mediastinal and hilar  contours are unremarkable. The heart is mildly enlarged. There are degenerative changes throughout the thoracic spine.  IMPRESSION: No definite active process. Mild linear scarring or atelectasis at the left lung base.   Electronically Signed   By: Ivar Drape M.D.   On: 12/09/2014 08:30    Assessment & Plan:   There are no diagnoses linked to this encounter. I have discontinued Diana Stout's predniSONE. I am also having her maintain her aspirin, fish oil-omega-3 fatty acids, furosemide, potassium chloride, olmesartan, cholecalciferol, levofloxacin, HYDROcodone-homatropine, and fluticasone.  Meds ordered this encounter  Medications  . fluticasone (FLONASE) 50 MCG/ACT nasal spray    Sig: INHALE 2 SPRAYS IEN QD AT NIGHT    Refill:  5     Follow-up: No Follow-up on file.  Walker Kehr, MD

## 2014-12-16 NOTE — Assessment & Plan Note (Signed)
On Benicar 

## 2014-12-21 DIAGNOSIS — R0602 Shortness of breath: Secondary | ICD-10-CM | POA: Diagnosis not present

## 2014-12-21 DIAGNOSIS — J22 Unspecified acute lower respiratory infection: Secondary | ICD-10-CM | POA: Diagnosis not present

## 2015-01-02 ENCOUNTER — Ambulatory Visit (INDEPENDENT_AMBULATORY_CARE_PROVIDER_SITE_OTHER): Payer: Medicare Other | Admitting: Internal Medicine

## 2015-01-02 ENCOUNTER — Ambulatory Visit (INDEPENDENT_AMBULATORY_CARE_PROVIDER_SITE_OTHER)
Admission: RE | Admit: 2015-01-02 | Discharge: 2015-01-02 | Disposition: A | Discharge status: Home / Self Care | Payer: Medicare Other | Source: Ambulatory Visit | Attending: Internal Medicine | Admitting: Internal Medicine

## 2015-01-02 ENCOUNTER — Encounter: Payer: Self-pay | Admitting: Internal Medicine

## 2015-01-02 VITALS — BP 118/70 | HR 103 | Ht 59.0 in | Wt 167.8 lb

## 2015-01-02 DIAGNOSIS — R0602 Shortness of breath: Secondary | ICD-10-CM | POA: Diagnosis not present

## 2015-01-02 DIAGNOSIS — R9389 Abnormal findings on diagnostic imaging of other specified body structures: Secondary | ICD-10-CM

## 2015-01-02 DIAGNOSIS — R059 Cough, unspecified: Secondary | ICD-10-CM

## 2015-01-02 DIAGNOSIS — R05 Cough: Secondary | ICD-10-CM

## 2015-01-02 DIAGNOSIS — E669 Obesity, unspecified: Secondary | ICD-10-CM | POA: Diagnosis not present

## 2015-01-02 DIAGNOSIS — R938 Abnormal findings on diagnostic imaging of other specified body structures: Secondary | ICD-10-CM | POA: Diagnosis not present

## 2015-01-02 MED ORDER — PANTOPRAZOLE SODIUM 40 MG PO TBEC: 40.0000 mg | DELAYED_RELEASE_TABLET | Freq: Every day | ORAL | Status: DC

## 2015-01-02 MED ORDER — FAMOTIDINE 20 MG PO TABS
ORAL_TABLET | ORAL | Status: DC
Start: 2015-01-02 — End: 2015-09-12

## 2015-01-02 MED ORDER — FLUTTER DEVI: Status: AC

## 2015-01-02 NOTE — Patient Instructions (Signed)
Whenever you feel the urge to cough, cough into the flutter valve  Pantoprazole (protonix) (prilosec otc is ok, too) 40 mg   Take  30-60 min before first meal of the day and Pepcid (famotidine)  20 mg one @  bedtime until return to office - this is the best way to tell whether stomach acid is contributing to your problem.    GERD (REFLUX)  is an extremely common cause of respiratory symptoms just like yours , many times with no obvious heartburn at all.    It can be treated with medication, but also with lifestyle changes including elevation of the head of your bed (ideally with 6 inch  bed blocks),  Smoking cessation, avoidance of late meals, excessive alcohol, and avoid fatty foods, chocolate, peppermint, colas, red wine, and acidic juices such as orange juice.  NO MINT OR MENTHOL PRODUCTS SO NO COUGH DROPS  USE SUGARLESS CANDY INSTEAD (Jolley ranchers or Stover's or Life Savers) or even ice chips will also do - the key is to swallow to prevent all throat clearing. NO OIL BASED VITAMINS - use powdered substitutes.     Please remember to go to the   x-ray department downstairs for your tests - we will call you with the results when they are available.

## 2015-01-02 NOTE — Progress Notes (Signed)
Subjective:    Patient ID: Diana Stout, female    DOB: [birth year],    MRN: [id number]  HPI  14 yowf never smoker, never resp problems until admit with Pna for around 2011 and sporadic cough since then maybe once yearly with recurrent cough since late Aug 2016 so self referred to pulmonary clinic 01/02/2015   Prev eval 05/2011 for MPN's rec I recommend repeat chest CT in 04/2012 (placed in tickle file for recall) unless you developed unexplained weight loss, pain with breathing, persistent cough or more short of breath than you are. > repeated 05/08/12 and no change, considered benign by radiology     01/02/2015 1st [facility] office visit/ Bradly Sangiovanni   Chief Complaint  Patient presents with  . Advice Only    Last seen 05/2011. C/o waking up in mornings w/ deep, dry cough in Aug. No PND, no nasal cong. Pt has been on ABX's x2.   insidious onset late Aug 2016 s cold/sinus but typically happens in winter and dx as bronchitis and typically better p rx with abx Seen 9/1 rx levaquin/ prednisone  - total of 10 days of levaquin  Better p cough syrup   No obvious other patterns in day to day or daytime variabilty or assoc sob  or cp or chest tightness, subjective wheeze overt sinus or hb symptoms. No unusual exp hx or h/o childhood pna/ asthma or knowledge of premature birth.  Sleeping ok without nocturnal  or early am exacerbation  of respiratory  c/o's or need for noct saba. Also denies any obvious fluctuation of symptoms with weather or environmental changes or other aggravating or alleviating factors except as outlined above   Current Medications, Allergies, Complete Past Medical History, Past Surgical History, Family History, and Social History were reviewed in [employer] record.          Review of Systems  Constitutional: Negative for fever and unexpected weight change.  HENT: Negative for congestion, dental problem, ear pain, nosebleeds, postnasal drip,  rhinorrhea, sinus pressure, sneezing, sore throat and trouble swallowing.   Eyes: Negative for redness and itching.  Respiratory: Positive for cough and shortness of breath. Negative for chest tightness and wheezing.   Cardiovascular: Negative for palpitations and leg swelling.  Gastrointestinal: Negative for nausea and vomiting.  Genitourinary: Negative for dysuria.  Musculoskeletal: Negative for joint swelling.  Skin: Negative for rash.  Neurological: Negative for headaches.  Hematological: Does not bruise/bleed easily.  Psychiatric/Behavioral: Negative for dysphoric mood. The patient is not nervous/anxious.        Objective:   Physical Exam  amb wf   Wt Readings from Last 3 Encounters:  01/02/15 167 lb 12.8 oz (76.114 kg)  12/08/14 169 lb (76.658 kg)  11/08/14 171 lb (77.565 kg)    Vital signs reviewed   HEENT: nl dentition, turbinates, and orophanx. Nl external ear canals without cough reflex   NECK :  without JVD/Nodes/TM/ nl carotid upstrokes bilaterally   LUNGS: no acc muscle use, clear to A and P bilaterally without cough on insp or exp maneuvers   CV:  RRR  no s3 or murmur or increase in P2, no edema   ABD:  soft and nontender with nl excursion in the supine position. No bruits or organomegaly, bowel sounds nl  MS:  warm without deformities, calf tenderness, cyanosis or clubbing  SKIN: warm and dry without lesions    NEURO:  alert, approp, no deficits     CXR PA and  Lateral:   01/02/2015 :     I personally reviewed images and agree with radiology impression as follows:   Atelectasis versus pneumonia associated with the lateral aspect fact of the left lung and pleural space. A small left pleural effusion is suspected. Followup PA and lateral chest X-ray is recommended in 3-4 weeks following trial of antibiotic therapy to ensure resolution and exclude underlying malignancy.  Vs 12/08/14 cxr very min change        Assessment & Plan:

## 2015-01-03 ENCOUNTER — Encounter: Payer: Self-pay | Admitting: Internal Medicine

## 2015-01-03 DIAGNOSIS — E669 Obesity, unspecified: Secondary | ICD-10-CM

## 2015-01-03 HISTORY — DX: Obesity, unspecified: E66.9

## 2015-01-03 NOTE — Assessment & Plan Note (Addendum)
05/14/11 CT CHEST WITHOUT CONTRAST 1. Multiple small pulmonary nodules scattered throughout the lungs  bilaterally ranging in size from 3-6 mm.    2. There is a small hiatal hernia.  3. Atherosclerosis.  4. Status post cholecystectomy.  5. Low attenuation hepatic lesions unchanged compared to recent  abdominal CT scan, likely to represent cysts.  - Repeat CT 05/08/12 > no change/ no f/u rec   Strongly doubt this is related to present symptoms or plain cxr findings so f/u just with with cxr in 4 weeks

## 2015-01-03 NOTE — Progress Notes (Signed)
Quick Note:  Spoke with pt and notified of results per Dr. Wert. Pt verbalized understanding and denied any questions.  ______ 

## 2015-01-03 NOTE — Assessment & Plan Note (Signed)
Body mass index is 33.87   Lab Results  Component Value Date   TSH 0.83 12/14/2013     Contributing to gerd tendency/ reviewed the need and the process to achieve and maintain neg calorie balance > defer f/u primary care including intermittently monitoring thyroid status

## 2015-01-03 NOTE — Assessment & Plan Note (Signed)
Of the three most common causes of chronic cough, only one (GERD)  can actually cause the other two (asthma and post nasal drip syndrome)  and perpetuate the cylce of cough inducing airway trauma, inflammation, heightened sensitivity to reflux which is prompted by the cough itself via a cyclical mechanism.    This may partially respond to steroids and look like asthma and post nasal drainage but never erradicated completely unless the cough and the secondary reflux are eliminated, preferably both at the same time.  While not intuitively obvious, many patients with chronic low grade reflux (she has documented HH so she is def at risk) do not cough until there is a secondary insult that disturbs the protective epithelial barrier and exposes sensitive nerve endings.  This can be viral or direct physical injury such as with an endotracheal tube.   The point is that once this occurs, it is difficult to eliminate using anything but a maximally effective acid suppression regimen at least in the short run, accompanied by an appropriate diet to address non acid GERD.   For now max rx for GERD / use flutter to prevent airway trauma from cough, and re-eval in 4 weeks with cxr since does have new findings (see pulmonary infiltrates)

## 2015-01-04 ENCOUNTER — Telehealth: Payer: Self-pay | Admitting: *Deleted

## 2015-01-04 NOTE — Telephone Encounter (Signed)
Spoke with the pt and notified of recs per MW  She verbalized understanding  Nothing further needed 

## 2015-01-04 NOTE — Telephone Encounter (Signed)
-----   Message from Tanda Rockers, MD sent at 01/03/2015  1:17 PM EDT ----- It is c/w a very small area of healing pneumonia from her most recent visits to internal medicine. The only way to prove it is return for f/u as rec   ----- Message -----    From: Rosana Berger, CMA    Sent: 01/03/2015  10:44 AM      To: Tanda Rockers, MD  Dr [name], this pt states that the only time she was told she had PNA was 6 yrs ago, has she had it more recently based on recent cxr?

## 2015-02-03 ENCOUNTER — Ambulatory Visit (INDEPENDENT_AMBULATORY_CARE_PROVIDER_SITE_OTHER): Payer: Medicare Other | Admitting: Internal Medicine

## 2015-02-03 ENCOUNTER — Telehealth: Payer: Self-pay | Admitting: Internal Medicine

## 2015-02-03 ENCOUNTER — Ambulatory Visit (INDEPENDENT_AMBULATORY_CARE_PROVIDER_SITE_OTHER)
Admission: RE | Admit: 2015-02-03 | Discharge: 2015-02-03 | Disposition: A | Discharge status: Home / Self Care | Payer: Medicare Other | Source: Ambulatory Visit | Attending: Internal Medicine | Admitting: Internal Medicine

## 2015-02-03 ENCOUNTER — Encounter: Payer: Self-pay | Admitting: Internal Medicine

## 2015-02-03 VITALS — BP 132/80 | HR 100 | Ht 59.0 in | Wt 166.8 lb

## 2015-02-03 DIAGNOSIS — Z23 Encounter for immunization: Secondary | ICD-10-CM | POA: Diagnosis not present

## 2015-02-03 DIAGNOSIS — R05 Cough: Secondary | ICD-10-CM | POA: Diagnosis not present

## 2015-02-03 DIAGNOSIS — R938 Abnormal findings on diagnostic imaging of other specified body structures: Secondary | ICD-10-CM | POA: Diagnosis not present

## 2015-02-03 DIAGNOSIS — R059 Cough, unspecified: Secondary | ICD-10-CM

## 2015-02-03 DIAGNOSIS — E669 Obesity, unspecified: Secondary | ICD-10-CM

## 2015-02-03 DIAGNOSIS — R9389 Abnormal findings on diagnostic imaging of other specified body structures: Secondary | ICD-10-CM

## 2015-02-03 NOTE — Telephone Encounter (Signed)
Spoke with pt, requesting cxr results from today.    MW please advise on results.  Thanks!

## 2015-02-03 NOTE — Progress Notes (Signed)
Subjective:    Patient ID: Diana Stout, female    DOB: May 31, 1930,    MRN: [id number]    Brief patient profile:  73 yowf never smoker, never resp problems until admit with Pna for around 2011 and sporadic cough since then maybe once yearly with recurrent cough since late Aug 2016 so self referred to pulmonary clinic 01/02/2015    History of Present Illness  Prev eval 05/2011 for MPN's rec I recommend repeat chest CT in 04/2012 (placed in tickle file for recall) unless you developed unexplained weight loss, pain with breathing, persistent cough or more short of breath than you are. > repeated 05/08/12 and no change, considered benign by radiology     01/02/2015   Pulmonary office visit in / [place]   Chief Complaint  Patient presents with  . Advice Only    Last seen 05/2011. C/o waking up in mornings w/ deep, dry cough in Aug. No PND, no nasal cong. Pt has been on ABX's x2.   insidious onset late Aug 2016 s cold/sinus but typically happens in winter and dx as bronchitis and typically better p rx with abx Seen 9/1 rx levaquin/ prednisone  - total of 10 days of levaquin  Better p cough syrup  rec Whenever you feel the urge to cough, cough into the flutter valve Pantoprazole (protonix) (prilosec otc is ok, too) 40 mg   Take  30-60 min before first meal of the day and Pepcid (famotidine)  20 mg one @  bedtime until return to office - this is the best way to tell whether stomach acid is contributing to your problem.   GERD diet   02/03/2015  f/u ov/Diana Stout re: cough s/p ? CAP  Chief Complaint  Patient presents with  . Follow-up    Pt states that cough has resolved itself. No new complaints voiced at visit      Not limited by breathing from desired activities  Including yard work  No obvious day to day or daytime variability or assoc chronic cough or cp or chest tightness, subjective wheeze or overt sinus or hb symptoms. No unusual exp hx or h/o childhood pna/ asthma or knowledge of  premature birth.  Sleeping ok without nocturnal  or early am exacerbation  of respiratory  c/o's or need for noct saba. Also denies any obvious fluctuation of symptoms with weather or environmental changes or other aggravating or alleviating factors except as outlined above   Current Medications, Allergies, Complete Past Medical History, Past Surgical History, Family History, and Social History were reviewed in [employer] record.  ROS  The following are not active complaints unless bolded sore throat, dysphagia, dental problems, itching, sneezing,  nasal congestion or excess/ purulent secretions, ear ache,   fever, chills, sweats, unintended wt loss, classically pleuritic or exertional cp, hemoptysis,  orthopnea pnd or leg swelling, presyncope, palpitations, abdominal pain, anorexia, nausea, vomiting, diarrhea  or change in bowel or bladder habits, change in stools or urine, dysuria,hematuria,  rash, arthralgias, visual complaints, headache, numbness, weakness or ataxia or problems with walking or coordination,  change in mood/affect or memory.          Objective:   Physical Exam  amb obese wf  nad  02/03/2015     167  Wt Readings from Last 3 Encounters:  01/02/15 167 lb 12.8 oz (76.114 kg)  12/08/14 169 lb (76.658 kg)  11/08/14 171 lb (77.565 kg)    Vital signs reviewed   HEENT:  nl dentition, turbinates, and orophanx. Nl external ear canals without cough reflex   NECK :  without JVD/Nodes/TM/ nl carotid upstrokes bilaterally   LUNGS: no acc muscle use, clear to A and P bilaterally x for minimal decreased bs L base/ no cough on insp/exp    CV:  RRR  no s3 or murmur or increase in P2, no edema   ABD:  soft and nontender with nl excursion in the supine position. No bruits or organomegaly, bowel sounds nl  MS:  warm without deformities, calf tenderness, cyanosis or clubbing  SKIN: warm and dry without lesions    NEURO:  alert, approp, no deficits      CXR PA and Lateral:   01/02/2015 :     I personally reviewed images and agree with radiology impression as follows:   Atelectasis versus pneumonia associated with the lateral aspec  of the left lung and pleural space. A small left pleural effusion is suspected. Followup PA and lateral chest X-ray is recommended in 3-4 weeks following trial of antibiotic therapy to ensure resolution and exclude underlying malignancy.  Vs 12/08/14 cxr very min change    CXR PA and Lateral:   02/03/2015 :    I personally reviewed images and agree with radiology impression as follows:     Resolved left pleural effusion. Residual mild basilar lung opacity is likely scar     Assessment & Plan:

## 2015-02-03 NOTE — Patient Instructions (Addendum)
Please remember to go to the  x-ray department downstairs for your tests - we will call you with the results when they are available.  Ok to wean off you acid suppression after Dec 1 - at the first sign of any cough, scratchy throat, throat tickle start back right away on full suppression and cough into the flutter valve to prevent throat trauma from coughing   Try to get about 15 lb off if possible   Please remember to go to the  x-ray department downstairs for your tests - we will call you with the results when they are available.   If you are satisfied with your treatment plan,  let your doctor know and he/she can either refill your medications or you can return here when your prescription runs out.     If in any way you are not 100% satisfied,  please tell us.  If 100% better, tell your friends!  Pulmonary follow up is as needed

## 2015-02-04 ENCOUNTER — Encounter: Payer: Self-pay | Admitting: Internal Medicine

## 2015-02-04 NOTE — Assessment & Plan Note (Signed)
Body mass index is 33.67   No sign change  Lab Results  Component Value Date   TSH 0.83 12/14/2013     Contributing to gerd tendency/ doe/reviewed the need and the process to achieve and maintain neg calorie balance > defer f/u primary care including intermittently monitoring thyroid status

## 2015-02-04 NOTE — Assessment & Plan Note (Signed)
Flutter valve added 01/02/2015 > resolved 02/03/2015  - rec try off gerd rx p 03/09/15   She is clearly much better but has a tendency to cyclical cough may be fueled by reflux related to age and obesity.  Explained the natural history of uri/bronchitis/bronchopna (the latter probably long gone now)  and why it's necessary in patients at risk to treat GERD aggressively - at least  short term -   to reduce risk of evolving cyclical cough initially  triggered by epithelial injury and a heightened sensitivty to the effects of any upper airway irritants,  most importantly acid - related - then perpetuated by epithelial injury related to the cough itself as the upper airway collapses on itself.  That is, the more sensitive the epithelium becomes once it is damaged by the virus, the more the ensuing irritability> the more the cough, the more the secondary reflux (especially in those prone to reflux) the more the irritation of the sensitive mucosa and so on in a  Classic cyclical pattern.    So after December 1 recommended she taper off the reflux medicines and if her cough flares then a GI referral might be considered vs longterm gerd rx   Discussed the recent press about ppi's in the context of a statistically significant (but questionably clinically relevant) increase in CRI in pts on ppi vs h2's > bottom line is the lowest dose of ppi that controls   gerd is the right dose and if that dose is zero that's fine esp since h2's are cheaper.  I had an extended final summary discussion with the patient reviewing all relevant studies completed to date and  lasting 15 to 20 minutes of a 25 minute visit    Each maintenance medication was reviewed in detail including most importantly the difference between maintenance and as needed and under what circumstances the prns are to be used.  Please see instructions for details which were reviewed in writing and the patient given a copy.

## 2015-02-04 NOTE — Assessment & Plan Note (Signed)
05/14/11 CT CHEST WITHOUT CONTRAST 1. Multiple small pulmonary nodules scattered throughout the lungs  bilaterally ranging in size from 3-6 mm.    2. There is a small hiatal hernia.  3. Atherosclerosis.  4. Status post cholecystectomy.  5. Low attenuation hepatic lesions unchanged compared to recent  abdominal CT scan, likely to represent cysts.  - Repeat CT 05/08/12 > no change/ no f/u rec       Chest x-rays were reviewed and are basically normal now so she probably did have an area of bronchopneumonia in the left lower lobe. No further follow-up needed.

## 2015-02-06 NOTE — Telephone Encounter (Signed)
See results

## 2015-02-06 NOTE — Telephone Encounter (Signed)
Patient notified of CXR results. Nothing further needed. Closing encounter  

## 2015-03-04 DIAGNOSIS — J018 Other acute sinusitis: Secondary | ICD-10-CM | POA: Diagnosis not present

## 2015-05-09 ENCOUNTER — Encounter: Payer: Self-pay | Admitting: Internal Medicine

## 2015-05-09 ENCOUNTER — Other Ambulatory Visit (INDEPENDENT_AMBULATORY_CARE_PROVIDER_SITE_OTHER): Payer: Medicare Other

## 2015-05-09 ENCOUNTER — Ambulatory Visit (INDEPENDENT_AMBULATORY_CARE_PROVIDER_SITE_OTHER): Payer: Medicare Other | Admitting: Internal Medicine

## 2015-05-09 VITALS — BP 108/62 | HR 91 | Temp 97.6°F | Wt 169.0 lb

## 2015-05-09 DIAGNOSIS — R0600 Dyspnea, unspecified: Secondary | ICD-10-CM

## 2015-05-09 DIAGNOSIS — I10 Essential (primary) hypertension: Secondary | ICD-10-CM

## 2015-05-09 DIAGNOSIS — R7302 Impaired glucose tolerance (oral): Secondary | ICD-10-CM | POA: Diagnosis not present

## 2015-05-09 DIAGNOSIS — R300 Dysuria: Secondary | ICD-10-CM

## 2015-05-09 DIAGNOSIS — R202 Paresthesia of skin: Secondary | ICD-10-CM

## 2015-05-09 DIAGNOSIS — R0789 Other chest pain: Secondary | ICD-10-CM

## 2015-05-09 DIAGNOSIS — E669 Obesity, unspecified: Secondary | ICD-10-CM

## 2015-05-09 LAB — BASIC METABOLIC PANEL
BUN: 20 mg/dL (ref 6–23)
CALCIUM: 9.5 mg/dL (ref 8.4–10.5)
CHLORIDE: 107 meq/L (ref 96–112)
CO2: 31 mEq/L (ref 19–32)
CREATININE: 0.65 mg/dL (ref 0.40–1.20)
GFR: 92.7 mL/min (ref >60.00)
Glucose, Bld: 106 mg/dL — ABNORMAL HIGH (ref 70–99)
Potassium: 4.4 mEq/L (ref 3.5–5.1)
Sodium: 143 mEq/L (ref 135–145)

## 2015-05-09 LAB — URINALYSIS, ROUTINE W REFLEX MICROSCOPIC
Bilirubin Urine: NEGATIVE
HGB URINE DIPSTICK: NEGATIVE
Ketones, ur: NEGATIVE
NITRITE: NEGATIVE
RBC / HPF: NONE SEEN (ref 0–?)
SPECIFIC GRAVITY, URINE: 1.025 (ref 1.000–1.030)
TOTAL PROTEIN, URINE-UPE24: NEGATIVE
Urine Glucose: NEGATIVE
Urobilinogen, UA: 0.2 (ref 0.0–1.0)
pH: 5.5 (ref 5.0–8.0)

## 2015-05-09 LAB — CBC WITH DIFFERENTIAL/PLATELET
BASOS ABS: 0 10*3/uL (ref 0.0–0.1)
BASOS PCT: 0.5 % (ref 0.0–3.0)
EOS ABS: 0.1 10*3/uL (ref 0.0–0.7)
Eosinophils Relative: 3.6 % (ref 0.0–5.0)
HEMATOCRIT: 37.7 % (ref 36.0–46.0)
HEMOGLOBIN: 12.2 g/dL (ref 12.0–15.0)
LYMPHS PCT: 45.4 % (ref 12.0–46.0)
Lymphs Abs: 1.8 10*3/uL (ref 0.7–4.0)
MCHC: 32.5 g/dL (ref 30.0–36.0)
MCV: 83.5 fl (ref 78.0–100.0)
Monocytes Absolute: 0.4 10*3/uL (ref 0.1–1.0)
Monocytes Relative: 10.4 % (ref 3.0–12.0)
Neutro Abs: 1.6 10*3/uL (ref 1.4–7.7)
Neutrophils Relative %: 40.1 % — ABNORMAL LOW (ref 43.0–77.0)
Platelets: 189 10*3/uL (ref 150.0–400.0)
RBC: 4.51 Mil/uL (ref 3.87–5.11)
RDW: 15.8 % — AB (ref 11.5–15.5)
WBC: 4 10*3/uL (ref 4.0–10.5)

## 2015-05-09 LAB — CORTISOL: CORTISOL PLASMA: 6.9 ug/dL

## 2015-05-09 LAB — HEPATIC FUNCTION PANEL
ALT: 16 U/L (ref 0–35)
AST: 20 U/L (ref 0–37)
Albumin: 3.9 g/dL (ref 3.5–5.2)
Alkaline Phosphatase: 70 U/L (ref 39–117)
BILIRUBIN DIRECT: 0.1 mg/dL (ref 0.0–0.3)
BILIRUBIN TOTAL: 0.5 mg/dL (ref 0.2–1.2)
TOTAL PROTEIN: 7.2 g/dL (ref 6.0–8.3)

## 2015-05-09 LAB — TSH: TSH: 0.44 u[IU]/mL (ref 0.35–4.50)

## 2015-05-09 LAB — VITAMIN B12: Vitamin B-12: 274 pg/mL (ref 211–911)

## 2015-05-09 LAB — HEMOGLOBIN A1C: HEMOGLOBIN A1C: 5.9 % (ref 4.6–6.5)

## 2015-05-09 MED ORDER — OLMESARTAN MEDOXOMIL 20 MG PO TABS: 20.0000 mg | ORAL_TABLET | Freq: Every day | ORAL | Status: DC

## 2015-05-09 NOTE — Progress Notes (Signed)
Pre visit review using our clinic review tool, if applicable. No additional management support is needed unless otherwise documented below in the visit note. 

## 2015-05-09 NOTE — Assessment & Plan Note (Signed)
UA

## 2015-05-09 NOTE — Assessment & Plan Note (Signed)
CL stress test 

## 2015-05-09 NOTE — Assessment & Plan Note (Signed)
Low BP - hold Benicar Labs

## 2015-05-09 NOTE — Assessment & Plan Note (Signed)
A1c

## 2015-05-09 NOTE — Progress Notes (Signed)
Subjective:  Patient ID: Diana Stout, female    DOB: [birth year]  Age: 82 y.o. MRN: [id number]  CC: No chief complaint on file.   HPI Diana Stout presents for fatigue since Sept 2016. No CP or SOB. I'm just dragging... C/o urinary sx's x 3 weeks  Outpatient Prescriptions Prior to Visit  Medication Sig Dispense Refill  . aspirin 81 MG tablet Take 81 mg by mouth daily.      . cholecalciferol (VITAMIN D) 1000 UNITS tablet Take 1,000 Units by mouth daily.    . famotidine (PEPCID) 20 MG tablet One at bedtime 30 tablet 2  . fish oil-omega-3 fatty acids 1000 MG capsule Take 1 g by mouth daily.      . fluticasone (FLONASE) 50 MCG/ACT nasal spray INHALE 2 SPRAYS IEN QD AT NIGHT as needed  5  . furosemide (LASIX) 20 MG tablet Take 1 tablet (20 mg total) by mouth daily as needed. 30 tablet 3  . HYDROcodone-homatropine (HYCODAN) 5-1.5 MG/5ML syrup Take 5 mLs by mouth every 6 (six) hours as needed for cough.    . olmesartan (BENICAR) 20 MG tablet Take 1 tablet (20 mg total) by mouth daily. 30 tablet 11  . pantoprazole (PROTONIX) 40 MG tablet Take 1 tablet (40 mg total) by mouth daily. Take 30-60 min before first meal of the day 30 tablet 2  . potassium chloride (KLOR-CON 10) 10 MEQ tablet Take 1 tablet (10 mEq total) by mouth daily as needed. Take w/Furosemide 30 tablet 3  . Respiratory Therapy Supplies (FLUTTER) DEVI Use as directed 1 each 0   No facility-administered medications prior to visit.    ROS Review of Systems  Constitutional: Positive for fatigue. Negative for chills, activity change, appetite change and unexpected weight change.  HENT: Negative for congestion, mouth sores and sinus pressure.   Eyes: Negative for visual disturbance.  Respiratory: Positive for shortness of breath. Negative for cough and chest tightness.   Cardiovascular: Positive for chest pain. Negative for palpitations and leg swelling.  Gastrointestinal: Negative for nausea and abdominal pain.    Genitourinary: Positive for urgency. Negative for frequency, difficulty urinating and vaginal pain.  Musculoskeletal: Positive for gait problem. Negative for back pain and neck stiffness.  Skin: Negative for pallor and rash.  Neurological: Positive for weakness. Negative for dizziness, tremors, numbness and headaches.  Psychiatric/Behavioral: Negative for suicidal ideas, confusion and sleep disturbance. The patient is not nervous/anxious.     Objective:  BP 108/62 mmHg  Pulse 91  Temp(Src) 97.6 F (36.4 C) (Oral)  Wt 169 lb (76.658 kg)  SpO2 95%  LMP 04/08/1996  BP Readings from Last 3 Encounters:  05/09/15 108/62  02/03/15 132/80  01/02/15 118/70    Wt Readings from Last 3 Encounters:  05/09/15 169 lb (76.658 kg)  02/03/15 166 lb 12.8 oz (75.66 kg)  01/02/15 167 lb 12.8 oz (76.114 kg)    Physical Exam  Constitutional: She appears well-developed. No distress.  HENT:  Head: Normocephalic.  Right Ear: External ear normal.  Left Ear: External ear normal.  Nose: Nose normal.  Mouth/Throat: Oropharynx is clear and moist.  Eyes: Conjunctivae are normal. Pupils are equal, round, and reactive to light. Right eye exhibits no discharge. Left eye exhibits no discharge.  Neck: Normal range of motion. Neck supple. No JVD present. No tracheal deviation present. No thyromegaly present.  Cardiovascular: Normal rate, regular rhythm and normal heart sounds.   Pulmonary/Chest: No stridor. No respiratory distress. She has no wheezes.  Abdominal: Soft. Bowel sounds are normal. She exhibits no distension and no mass. There is no tenderness. There is no rebound and no guarding.  Musculoskeletal: She exhibits no edema or tenderness.  Lymphadenopathy:    She has no cervical adenopathy.  Neurological: She displays normal reflexes. No cranial nerve deficit. She exhibits normal muscle tone. Coordination normal.  Skin: No rash noted. No erythema.  Psychiatric: She has a normal mood and affect. Her  behavior is normal. Judgment and thought content normal.  Obese  Lab Results  Component Value Date   WBC 5.8 06/22/2014   HGB 13.1 06/22/2014   HCT 38.9 06/22/2014   PLT 197.0 06/22/2014   GLUCOSE 88 06/22/2014   CHOL 216* 03/23/2014   TRIG 87.0 03/23/2014   HDL 38.30* 03/23/2014   LDLDIRECT 154.9 05/01/2011   LDLCALC 160* 03/23/2014   ALT 14 06/22/2014   AST 18 06/22/2014   NA 140 06/22/2014   K 4.0 06/22/2014   CL 103 06/22/2014   CREATININE 0.75 06/22/2014   BUN 20 06/22/2014   CO2 31 06/22/2014   TSH 0.83 12/14/2013   HGBA1C 6.1 07/19/2013    Dg Chest 2 View  02/03/2015  CLINICAL DATA:  Cough.  Routine follow-up EXAM: CHEST  2 VIEW COMPARISON:  01/02/2015 FINDINGS: No cardiomegaly.  Negative aortic and hilar contours. Resolved small left pleural effusion. Residual minimal linear opacity is likely atelectasis or scar. No edema, consolidation, or air leak. IMPRESSION: Resolved left pleural effusion. Residual mild basilar lung opacity is likely scar. Electronically Signed   By: Monte Fantasia M.D.   On: 02/03/2015 13:32    Assessment & Plan:   There are no diagnoses linked to this encounter. I am having Ms. Humber maintain her aspirin, fish oil-omega-3 fatty acids, furosemide, potassium chloride, olmesartan, cholecalciferol, fluticasone, HYDROcodone-homatropine, FLUTTER, pantoprazole, and famotidine.  No orders of the defined types were placed in this encounter.     Follow-up: No Follow-up on file.  Walker Kehr, MD

## 2015-05-09 NOTE — Patient Instructions (Signed)
Hold Benicar for now

## 2015-05-09 NOTE — Assessment & Plan Note (Signed)
Labs Loose wt

## 2015-05-15 ENCOUNTER — Encounter: Payer: Self-pay | Admitting: Cardiology

## 2015-05-26 DIAGNOSIS — B9689 Other specified bacterial agents as the cause of diseases classified elsewhere: Secondary | ICD-10-CM | POA: Diagnosis not present

## 2015-05-26 DIAGNOSIS — J019 Acute sinusitis, unspecified: Secondary | ICD-10-CM | POA: Diagnosis not present

## 2015-05-29 ENCOUNTER — Telehealth (HOSPITAL_COMMUNITY): Payer: Self-pay

## 2015-05-29 NOTE — Telephone Encounter (Signed)
Encounter complete. 

## 2015-05-30 ENCOUNTER — Telehealth (HOSPITAL_COMMUNITY): Payer: Self-pay | Admitting: Radiology

## 2015-05-30 NOTE — Telephone Encounter (Signed)
Patient given detailed instructions per Myocardial Perfusion Study Information Sheet for the test on 06/09/2015 at 9:30. Patient notified to arrive 15 minutes early and that it is imperative to arrive on time for appointment to keep from having the test rescheduled.  If you need to cancel or reschedule your appointment, please call the office within 24 hours of your appointment. Failure to do so may result in a cancellation of your appointment, and a $50 no show fee. Patient verbalized understanding.EHK

## 2015-05-31 ENCOUNTER — Encounter (HOSPITAL_COMMUNITY): Payer: Medicare Other

## 2015-06-06 ENCOUNTER — Ambulatory Visit: Payer: Medicare Other | Admitting: Internal Medicine

## 2015-06-09 ENCOUNTER — Ambulatory Visit (HOSPITAL_COMMUNITY): Payer: Medicare Other | Attending: Cardiology

## 2015-06-09 DIAGNOSIS — R0789 Other chest pain: Secondary | ICD-10-CM | POA: Insufficient documentation

## 2015-06-09 DIAGNOSIS — R0602 Shortness of breath: Secondary | ICD-10-CM | POA: Insufficient documentation

## 2015-06-09 DIAGNOSIS — R0600 Dyspnea, unspecified: Secondary | ICD-10-CM | POA: Diagnosis not present

## 2015-06-09 DIAGNOSIS — R5383 Other fatigue: Secondary | ICD-10-CM | POA: Diagnosis not present

## 2015-06-09 DIAGNOSIS — I10 Essential (primary) hypertension: Secondary | ICD-10-CM | POA: Insufficient documentation

## 2015-06-09 LAB — MYOCARDIAL PERFUSION IMAGING
CHL CUP MPHR: 138 {beats}/min
CHL CUP NUCLEAR SDS: 9
CHL CUP NUCLEAR SSS: 17
CSEPEW: 7 METS
CSEPHR: 113 %
CSEPPHR: 157 {beats}/min
Exercise duration (min): 6 min
Exercise duration (sec): 0 s
LHR: 0.29
LVDIAVOL: 58 mL
LVSYSVOL: 19 mL
Rest HR: 67 {beats}/min
SRS: 8
TID: 1.08

## 2015-06-09 MED ORDER — TECHNETIUM TC 99M SESTAMIBI GENERIC - CARDIOLITE
30.9000 | Freq: Once | INTRAVENOUS | Status: AC | PRN
  Administered 2015-06-09: 30.9 via INTRAVENOUS

## 2015-06-09 MED ORDER — TECHNETIUM TC 99M SESTAMIBI GENERIC - CARDIOLITE
10.1000 | Freq: Once | INTRAVENOUS | Status: AC | PRN
  Administered 2015-06-09: 10.1 via INTRAVENOUS

## 2015-06-23 ENCOUNTER — Ambulatory Visit (INDEPENDENT_AMBULATORY_CARE_PROVIDER_SITE_OTHER): Payer: Medicare Other | Admitting: Internal Medicine

## 2015-06-23 ENCOUNTER — Encounter: Payer: Self-pay | Admitting: Internal Medicine

## 2015-06-23 VITALS — BP 122/82 | HR 80 | Temp 97.0°F | Ht 59.0 in

## 2015-06-23 DIAGNOSIS — R0600 Dyspnea, unspecified: Secondary | ICD-10-CM

## 2015-06-23 DIAGNOSIS — R7302 Impaired glucose tolerance (oral): Secondary | ICD-10-CM | POA: Diagnosis not present

## 2015-06-23 DIAGNOSIS — R5383 Other fatigue: Secondary | ICD-10-CM

## 2015-06-23 DIAGNOSIS — I10 Essential (primary) hypertension: Secondary | ICD-10-CM | POA: Diagnosis not present

## 2015-06-23 MED ORDER — OLMESARTAN MEDOXOMIL 20 MG PO TABS: 20.0000 mg | ORAL_TABLET | Freq: Every day | ORAL | Status: DC

## 2015-06-23 MED ORDER — POTASSIUM CHLORIDE ER 10 MEQ PO TBCR: 10.0000 meq | EXTENDED_RELEASE_TABLET | Freq: Every day | ORAL | Status: DC | PRN

## 2015-06-23 MED ORDER — FUROSEMIDE 20 MG PO TABS: 20.0000 mg | ORAL_TABLET | Freq: Every day | ORAL | Status: DC | PRN

## 2015-06-23 NOTE — Progress Notes (Signed)
Pre visit review using our clinic review tool, if applicable. No additional management support is needed unless otherwise documented below in the visit note. 

## 2015-06-23 NOTE — Progress Notes (Signed)
Subjective:  Patient ID: Diana Stout, female    DOB: 1930/11/06  Age: 82 y.o. MRN: [id number]  CC: No chief complaint on file.   HPI [name] presents for SOB f/u - better w/exercise. Stress test was nl. On Vit B12 now for low Vit B12 - better  Outpatient Prescriptions Prior to Visit  Medication Sig Dispense Refill  . aspirin 81 MG tablet Take 81 mg by mouth daily.      . cholecalciferol (VITAMIN D) 1000 UNITS tablet Take 1,000 Units by mouth daily.    . famotidine (PEPCID) 20 MG tablet One at bedtime 30 tablet 2  . fish oil-omega-3 fatty acids 1000 MG capsule Take 1 g by mouth daily.      . fluticasone (FLONASE) 50 MCG/ACT nasal spray INHALE 2 SPRAYS IEN QD AT NIGHT as needed  5  . furosemide (LASIX) 20 MG tablet Take 1 tablet (20 mg total) by mouth daily as needed. 30 tablet 3  . olmesartan (BENICAR) 20 MG tablet Take 1 tablet (20 mg total) by mouth daily. (Patient taking differently: Take 10 mg by mouth daily. ) 30 tablet 11  . pantoprazole (PROTONIX) 40 MG tablet Take 1 tablet (40 mg total) by mouth daily. Take 30-60 min before first meal of the day 30 tablet 2  . potassium chloride (KLOR-CON 10) 10 MEQ tablet Take 1 tablet (10 mEq total) by mouth daily as needed. Take w/Furosemide 30 tablet 3  . Respiratory Therapy Supplies (FLUTTER) DEVI Use as directed 1 each 0   No facility-administered medications prior to visit.    ROS Review of Systems  Constitutional: Negative for chills, activity change, appetite change, fatigue and unexpected weight change.  HENT: Negative for congestion, mouth sores and sinus pressure.   Eyes: Negative for visual disturbance.  Respiratory: Negative for cough and chest tightness.   Gastrointestinal: Negative for nausea and abdominal pain.  Genitourinary: Negative for frequency, difficulty urinating and vaginal pain.  Musculoskeletal: Positive for arthralgias. Negative for back pain and gait problem.  Skin: Negative for pallor and rash.    Neurological: Negative for dizziness, tremors, weakness, numbness and headaches.  Psychiatric/Behavioral: Negative for confusion and sleep disturbance.    Objective:  BP 122/82 mmHg  Pulse 80  Temp(Src) 97 F (36.1 C) (Oral)  Ht 4\' 11"  (1.499 m)  SpO2 95%  LMP 04/08/1996  BP Readings from Last 3 Encounters:  06/23/15 122/82  05/09/15 108/62  02/03/15 132/80    Wt Readings from Last 3 Encounters:  05/09/15 169 lb (76.658 kg)  02/03/15 166 lb 12.8 oz (75.66 kg)  01/02/15 167 lb 12.8 oz (76.114 kg)    Physical Exam  Constitutional: She appears well-developed. No distress.  HENT:  Head: Normocephalic.  Right Ear: External ear normal.  Left Ear: External ear normal.  Nose: Nose normal.  Mouth/Throat: Oropharynx is clear and moist.  Eyes: Conjunctivae are normal. Pupils are equal, round, and reactive to light. Right eye exhibits no discharge. Left eye exhibits no discharge.  Neck: Normal range of motion. Neck supple. No JVD present. No tracheal deviation present. No thyromegaly present.  Cardiovascular: Normal rate, regular rhythm and normal heart sounds.   Pulmonary/Chest: No stridor. No respiratory distress. She has no wheezes.  Abdominal: Soft. Bowel sounds are normal. She exhibits no distension and no mass. There is no tenderness. There is no rebound and no guarding.  Musculoskeletal: She exhibits no edema or tenderness.  Lymphadenopathy:    She has no cervical adenopathy.  Neurological: She displays normal reflexes. No cranial nerve deficit. She exhibits normal muscle tone. Coordination normal.  Skin: No rash noted. No erythema.  Psychiatric: She has a normal mood and affect. Her behavior is normal. Judgment and thought content normal.    Lab Results  Component Value Date   WBC 4.0 05/09/2015   HGB 12.2 05/09/2015   HCT 37.7 05/09/2015   PLT 189.0 05/09/2015   GLUCOSE 106* 05/09/2015   CHOL 216* 03/23/2014   TRIG 87.0 03/23/2014   HDL 38.30* 03/23/2014    LDLDIRECT 154.9 05/01/2011   LDLCALC 160* 03/23/2014   ALT 16 05/09/2015   AST 20 05/09/2015   NA 143 05/09/2015   K 4.4 05/09/2015   CL 107 05/09/2015   CREATININE 0.65 05/09/2015   BUN 20 05/09/2015   CO2 31 05/09/2015   TSH 0.44 05/09/2015   HGBA1C 5.9 05/09/2015    Dg Chest 2 View  02/03/2015  CLINICAL DATA:  Cough.  Routine follow-up EXAM: CHEST  2 VIEW COMPARISON:  01/02/2015 FINDINGS: No cardiomegaly.  Negative aortic and hilar contours. Resolved small left pleural effusion. Residual minimal linear opacity is likely atelectasis or scar. No edema, consolidation, or air leak. IMPRESSION: Resolved left pleural effusion. Residual mild basilar lung opacity is likely scar. Electronically Signed   By: Monte Fantasia M.D.   On: 02/03/2015 13:32    Assessment & Plan:   There are no diagnoses linked to this encounter. I am having Ms. Ovando maintain her aspirin, fish oil-omega-3 fatty acids, furosemide, potassium chloride, cholecalciferol, fluticasone, FLUTTER, pantoprazole, famotidine, olmesartan, and cyanocobalamin.  Meds ordered this encounter  Medications  . cyanocobalamin 1000 MCG tablet    Sig: Take 1,000 mcg by mouth daily.     Follow-up: No Follow-up on file.  Walker Kehr, MD

## 2015-06-25 ENCOUNTER — Encounter: Payer: Self-pay | Admitting: Internal Medicine

## 2015-06-25 NOTE — Assessment & Plan Note (Signed)
2017 deconditioning - CL stress test was nl - better w/exercise

## 2015-06-25 NOTE — Assessment & Plan Note (Signed)
Reduce Benicar dose in 1/2 if low BP

## 2015-06-25 NOTE — Assessment & Plan Note (Signed)
Monitor CBG

## 2015-06-25 NOTE — Assessment & Plan Note (Signed)
1/17 deconditioning - CL stress test was nl

## 2015-06-26 ENCOUNTER — Ambulatory Visit: Payer: Medicare Other

## 2015-06-27 ENCOUNTER — Encounter: Payer: Self-pay | Admitting: Internal Medicine

## 2015-07-22 ENCOUNTER — Encounter (HOSPITAL_COMMUNITY): Payer: Self-pay | Admitting: Emergency Medicine

## 2015-07-22 ENCOUNTER — Emergency Department (HOSPITAL_COMMUNITY)
Admission: EM | Admit: 2015-07-22 | Discharge: 2015-07-23 | Disposition: A | Discharge status: Home / Self Care | Payer: Medicare Other | Attending: Emergency Medicine | Admitting: Emergency Medicine

## 2015-07-22 DIAGNOSIS — Q6100 Congenital renal cyst, unspecified: Secondary | ICD-10-CM | POA: Diagnosis not present

## 2015-07-22 DIAGNOSIS — S8012XA Contusion of left lower leg, initial encounter: Secondary | ICD-10-CM | POA: Insufficient documentation

## 2015-07-22 DIAGNOSIS — Y9289 Other specified places as the place of occurrence of the external cause: Secondary | ICD-10-CM | POA: Insufficient documentation

## 2015-07-22 DIAGNOSIS — Y990 Civilian activity done for income or pay: Secondary | ICD-10-CM | POA: Insufficient documentation

## 2015-07-22 DIAGNOSIS — Z7982 Long term (current) use of aspirin: Secondary | ICD-10-CM | POA: Insufficient documentation

## 2015-07-22 DIAGNOSIS — K219 Gastro-esophageal reflux disease without esophagitis: Secondary | ICD-10-CM | POA: Diagnosis not present

## 2015-07-22 DIAGNOSIS — M79662 Pain in left lower leg: Secondary | ICD-10-CM | POA: Diagnosis not present

## 2015-07-22 DIAGNOSIS — E785 Hyperlipidemia, unspecified: Secondary | ICD-10-CM | POA: Insufficient documentation

## 2015-07-22 DIAGNOSIS — M810 Age-related osteoporosis without current pathological fracture: Secondary | ICD-10-CM | POA: Insufficient documentation

## 2015-07-22 DIAGNOSIS — I10 Essential (primary) hypertension: Secondary | ICD-10-CM | POA: Diagnosis not present

## 2015-07-22 DIAGNOSIS — Z86718 Personal history of other venous thrombosis and embolism: Secondary | ICD-10-CM | POA: Diagnosis not present

## 2015-07-22 DIAGNOSIS — Z79899 Other long term (current) drug therapy: Secondary | ICD-10-CM | POA: Diagnosis not present

## 2015-07-22 DIAGNOSIS — Y9389 Activity, other specified: Secondary | ICD-10-CM | POA: Diagnosis not present

## 2015-07-22 DIAGNOSIS — S8992XA Unspecified injury of left lower leg, initial encounter: Secondary | ICD-10-CM | POA: Diagnosis present

## 2015-07-22 DIAGNOSIS — W010XXA Fall on same level from slipping, tripping and stumbling without subsequent striking against object, initial encounter: Secondary | ICD-10-CM | POA: Diagnosis not present

## 2015-07-22 DIAGNOSIS — Z8619 Personal history of other infectious and parasitic diseases: Secondary | ICD-10-CM | POA: Diagnosis not present

## 2015-07-22 NOTE — ED Notes (Addendum)
Pt from home with some bruising to her left calf from falling onto a bush while she was doing yard work. Pt is ambulatory and denies pain. Pt states she tripped over the shrubbery. There appears to be no break in the skin. Pt sustained no other injuries in the fall. Per pt, her family just doesn't "like the look of the bruise" and wants her to get it checked out. Pt states she is on blood thinner of a baby aspirin per day; pt had a dvt 20 years ago

## 2015-07-23 ENCOUNTER — Ambulatory Visit (HOSPITAL_BASED_OUTPATIENT_CLINIC_OR_DEPARTMENT_OTHER)
Admission: RE | Admit: 2015-07-23 | Discharge: 2015-07-23 | Disposition: A | Discharge status: Home / Self Care | Payer: Medicare Other | Source: Ambulatory Visit | Attending: Emergency Medicine | Admitting: Emergency Medicine

## 2015-07-23 ENCOUNTER — Emergency Department (HOSPITAL_COMMUNITY): Payer: Medicare Other

## 2015-07-23 DIAGNOSIS — I10 Essential (primary) hypertension: Secondary | ICD-10-CM | POA: Insufficient documentation

## 2015-07-23 DIAGNOSIS — E785 Hyperlipidemia, unspecified: Secondary | ICD-10-CM

## 2015-07-23 DIAGNOSIS — S8012XA Contusion of left lower leg, initial encounter: Secondary | ICD-10-CM | POA: Diagnosis not present

## 2015-07-23 DIAGNOSIS — M79662 Pain in left lower leg: Secondary | ICD-10-CM | POA: Diagnosis not present

## 2015-07-23 DIAGNOSIS — K219 Gastro-esophageal reflux disease without esophagitis: Secondary | ICD-10-CM | POA: Insufficient documentation

## 2015-07-23 DIAGNOSIS — S8992XA Unspecified injury of left lower leg, initial encounter: Secondary | ICD-10-CM | POA: Diagnosis not present

## 2015-07-23 DIAGNOSIS — M7989 Other specified soft tissue disorders: Secondary | ICD-10-CM

## 2015-07-23 LAB — CBC WITH DIFFERENTIAL/PLATELET
BASOS ABS: 0 10*3/uL (ref 0.0–0.1)
BASOS PCT: 0 %
EOS ABS: 0.2 10*3/uL (ref 0.0–0.7)
Eosinophils Relative: 4 %
HCT: 35.4 % — ABNORMAL LOW (ref 36.0–46.0)
HEMOGLOBIN: 11.5 g/dL — AB (ref 12.0–15.0)
Lymphocytes Relative: 47 %
Lymphs Abs: 2.2 10*3/uL (ref 0.7–4.0)
MCH: 27.4 pg (ref 26.0–34.0)
MCHC: 32.5 g/dL (ref 30.0–36.0)
MCV: 84.5 fL (ref 78.0–100.0)
MONO ABS: 0.4 10*3/uL (ref 0.1–1.0)
MONOS PCT: 7 %
NEUTROS PCT: 42 %
Neutro Abs: 2 10*3/uL (ref 1.7–7.7)
Platelets: 200 10*3/uL (ref 150–400)
RBC: 4.19 MIL/uL (ref 3.87–5.11)
RDW: 15.7 % — AB (ref 11.5–15.5)
WBC: 4.9 10*3/uL (ref 4.0–10.5)

## 2015-07-23 LAB — I-STAT CHEM 8, ED
BUN: 24 mg/dL — ABNORMAL HIGH (ref 6–20)
Calcium, Ion: 1.16 mmol/L (ref 1.13–1.30)
Chloride: 104 mmol/L (ref 101–111)
Creatinine, Ser: 0.8 mg/dL (ref 0.44–1.00)
Glucose, Bld: 104 mg/dL — ABNORMAL HIGH (ref 65–99)
HEMATOCRIT: 38 % (ref 36.0–46.0)
HEMOGLOBIN: 12.9 g/dL (ref 12.0–15.0)
POTASSIUM: 3.8 mmol/L (ref 3.5–5.1)
SODIUM: 142 mmol/L (ref 135–145)
TCO2: 26 mmol/L (ref 0–100)

## 2015-07-23 NOTE — ED Provider Notes (Signed)
CSN: [id number]     Arrival date & time 07/22/15  2120 History  By signing my name below, I, Hansel Feinstein, attest that this documentation has been prepared under the direction and in the presence of Merryl Hacker, MD. Electronically Signed: Hansel Feinstein, ED Scribe. 07/23/2015. 12:34 AM.    Chief Complaint  Patient presents with  . Leg Injury   The history is provided by the patient. No language interpreter was used.   HPI Comments: Diana Stout is a 82 y.o. female who presents to the Emergency Department complaining of moderate left shin pain, swelling and ecchymosis s/p mechanical fall yesterday. Pt states she tripped over a tree stump and fell forward, landing on her left leg. Denies LOC or head injury. She states her pain is worsened with movement, ambulation and weight bearing. Pt states she has applied ice to the area with some relief of pain and swelling. Pt is ambulatory without difficulty. She takes daily ASA, no other daily anticoagulants. H/o left lower extremity DVT 20 years ago.  Denies numbness, weakness, paresthesia, additional injuries. NKDA.   Past Medical History  Diagnosis Date  . Hypertension   . Hyperlipidemia   . DVT (deep venous thrombosis) ([place]) 1998    hx of right  . GERD (gastroesophageal reflux disease)   . Osteoporosis   . Esophagitis 2006  . Palpitation   . Kidney cysts     left- Dr [name]  . Diverticulosis of colon   . IBS (irritable bowel syndrome)   . History of shingles   . Glucose intolerance (impaired glucose tolerance)   . Impaired glucose tolerance 11/22/2010  . Multiple lung nodules 2012    seen on CT scan by Dr [name], pulmonologist   Past Surgical History  Procedure Laterality Date  . Cholecystectomy    . Tubal ligation Bilateral   . Appendectomy    . Hysteroscopy with resectoscope  7/99    resect polyp   Family History  Problem Relation Age of Onset  . Asthma Mother   . Diabetes Father   . Cancer Father     liver  . Cancer  Sister     breast  . Diabetes Sister   . Asthma Sister   . Breast cancer Sister   . Diabetes Brother   . Asthma Brother   . Diabetes Brother   . Asthma Sister   . Coronary artery disease Other     1 st degree female relative  . Thyroid disease Maternal Uncle    Social History  Substance Use Topics  . Smoking status: Never Smoker   . Smokeless tobacco: Never Used  . Alcohol Use: No   OB History    Gravida Para Term Preterm AB TAB SAB Ectopic Multiple Living   3 3 3       3      Review of Systems  Cardiovascular: Positive for leg swelling ( left shin).  Musculoskeletal: Positive for arthralgias ( left shin).  Skin: Positive for color change ( ecchymosis to left shin).  Neurological: Negative for weakness and numbness.  All other systems reviewed and are negative.  Allergies  Atorvastatin; Alendronate sodium; Ceftriaxone sodium; Codeine phosphate; Moxifloxacin; and Simvastatin  Home Medications   Prior to Admission medications   Medication Sig Start Date End Date Taking? Authorizing Provider  aspirin 81 MG tablet Take 81 mg by mouth daily.      Historical Provider, MD  cholecalciferol (VITAMIN D) 1000 UNITS tablet Take 1,000 Units by mouth  daily.    Historical Provider, MD  cyanocobalamin 1000 MCG tablet Take 1,000 mcg by mouth daily.    Historical Provider, MD  famotidine (PEPCID) 20 MG tablet One at bedtime 01/02/15   Tanda Rockers, MD  fish oil-omega-3 fatty acids 1000 MG capsule Take 1 g by mouth daily.      Historical Provider, MD  fluticasone (FLONASE) 50 MCG/ACT nasal spray INHALE 2 SPRAYS IEN QD AT NIGHT as needed 11/13/14   Historical Provider, MD  furosemide (LASIX) 20 MG tablet Take 1 tablet (20 mg total) by mouth daily as needed. 06/23/15   Aleksei Plotnikov V, MD  olmesartan (BENICAR) 20 MG tablet Take 1 tablet (20 mg total) by mouth daily. 06/23/15   Aleksei Plotnikov V, MD  pantoprazole (PROTONIX) 40 MG tablet Take 1 tablet (40 mg total) by mouth daily. Take 30-60  min before first meal of the day 01/02/15   Tanda Rockers, MD  potassium chloride (KLOR-CON 10) 10 MEQ tablet Take 1 tablet (10 mEq total) by mouth daily as needed. Take w/Furosemide 06/23/15   [name], MD  Respiratory Therapy Supplies (FLUTTER) DEVI Use as directed 01/02/15   Tanda Rockers, MD   BP 136/95 mmHg  Pulse 88  Temp(Src) 97.5 F (36.4 C) (Oral)  Resp 16  SpO2 96%  LMP 04/08/1996 Physical Exam  Constitutional: She is oriented to person, place, and time. She appears well-developed and well-nourished. No distress.  HENT:  Head: Normocephalic and atraumatic.  Cardiovascular: Normal rate and regular rhythm.   Pulmonary/Chest: Effort normal. No respiratory distress.  Musculoskeletal:  1+ bilateral lower extremity edema which is symmetric, contusion noted over the left shin with mild swelling noted, there are adjacent petechiae, no calf tenderness  Neurological: She is alert and oriented to person, place, and time.  Skin: Skin is warm and dry.  Psychiatric: She has a normal mood and affect.  Nursing note and vitals reviewed.   ED Course  Procedures (including critical care time) DIAGNOSTIC STUDIES: Oxygen Saturation is 96% on RA, adequate by my interpretation.    COORDINATION OF CARE: 12:34 AM Discussed treatment plan with pt at bedside which includes lab work, XR, f/u for [place] and pt agreed to plan.  Imaging Review No results found.   Labs Review Labs Reviewed  CBC WITH DIFFERENTIAL/PLATELET - Abnormal; Notable for the following:    Hemoglobin 11.5 (*)    HCT 35.4 (*)    RDW 15.7 (*)    All other components within normal limits  I-STAT CHEM 8, ED - Abnormal; Notable for the following:    BUN 24 (*)    Glucose, Bld 104 (*)    All other components within normal limits    I have personally reviewed and evaluated these images and lab results as part of my medical decision-making.    MDM   Final diagnoses:  Contusion of leg, left, initial encounter     Patient presents with injury left shin. Family was concerned at the way it looked. She does have evidence of bruising but also has an adjacent petechial rash. She is on aspirin daily. Lab work obtained to evaluate platelets area and this is reassuring. Plain films were reviewed by myself and there is no obvious fracture. I have low suspicion at this time for DVT; however, given history of DVT and new injury, will bring back for ultrasound.  Will not anticoagulate at this time as I have low suspicion and patient already has bruising to the  extremity.  After history, exam, and medical workup I feel the patient has been appropriately medically screened and is safe for discharge home. Pertinent diagnoses were discussed with the patient. Patient was given return precautions.  I personally performed the services described in this documentation, which was scribed in my presence. The recorded information has been reviewed and is accurate.   Merryl Hacker, MD 07/23/15 0157

## 2015-07-23 NOTE — Discharge Instructions (Signed)
You will be scheduled for [place] to rule out DVT.  Contusion A contusion is a deep bruise. Contusions are the result of a blunt injury to tissues and muscle fibers under the skin. The injury causes bleeding under the skin. The skin overlying the contusion may turn blue, purple, or yellow. Minor injuries will give you a painless contusion, but more severe contusions may stay painful and swollen for a few weeks.  CAUSES  This condition is usually caused by a blow, trauma, or direct force to an area of the body. SYMPTOMS  Symptoms of this condition include:  Swelling of the injured area.  Pain and tenderness in the injured area.  Discoloration. The area may have redness and then turn blue, purple, or yellow. DIAGNOSIS  This condition is diagnosed based on a physical exam and medical history. An X-ray, CT scan, or MRI may be needed to determine if there are any associated injuries, such as broken bones (fractures). TREATMENT  Specific treatment for this condition depends on what area of the body was injured. In general, the best treatment for a contusion is resting, icing, applying pressure to (compression), and elevating the injured area. This is often called the RICE strategy. Over-the-counter anti-inflammatory medicines may also be recommended for pain control.  HOME CARE INSTRUCTIONS   Rest the injured area.  If directed, apply ice to the injured area:  Put ice in a plastic bag.  Place a towel between your skin and the bag.  Leave the ice on for 20 minutes, 2-3 times per day.  If directed, apply light compression to the injured area using an elastic bandage. Make sure the bandage is not wrapped too tightly. Remove and reapply the bandage as directed by your health care provider.  If possible, raise (elevate) the injured area above the level of your heart while you are sitting or lying down.  Take over-the-counter and prescription medicines only as told by your health care  provider. SEEK MEDICAL CARE IF:  Your symptoms do not improve after several days of treatment.  Your symptoms get worse.  You have difficulty moving the injured area. SEEK IMMEDIATE MEDICAL CARE IF:   You have severe pain.  You have numbness in a hand or foot.  Your hand or foot turns pale or cold.   This information is not intended to replace advice given to you by your health care provider. Make sure you discuss any questions you have with your health care provider.   Document Released: 01/02/2005 Document Revised: 12/14/2014 Document Reviewed: 08/10/2014 Elsevier Interactive Patient Education [employer].

## 2015-07-23 NOTE — Progress Notes (Signed)
VASCULAR LAB PRELIMINARY  PRELIMINARY  PRELIMINARY  PRELIMINARY  Left lower extremity venous duplex completed.    Preliminary report:  There is no DVT or SVT noted in the left lower extremity.   Pavle Wiler, RVT 07/23/2015, 8:38 AM

## 2015-08-28 DIAGNOSIS — Z85828 Personal history of other malignant neoplasm of skin: Secondary | ICD-10-CM | POA: Diagnosis not present

## 2015-08-28 DIAGNOSIS — L239 Allergic contact dermatitis, unspecified cause: Secondary | ICD-10-CM | POA: Diagnosis not present

## 2015-09-02 ENCOUNTER — Emergency Department (HOSPITAL_COMMUNITY): Payer: Medicare Other

## 2015-09-02 ENCOUNTER — Emergency Department (HOSPITAL_COMMUNITY)
Admission: EM | Admit: 2015-09-02 | Discharge: 2015-09-02 | Disposition: A | Discharge status: Home / Self Care | Payer: Medicare Other | Attending: Emergency Medicine | Admitting: Emergency Medicine

## 2015-09-02 ENCOUNTER — Encounter (HOSPITAL_COMMUNITY): Payer: Self-pay

## 2015-09-02 DIAGNOSIS — Q6100 Congenital renal cyst, unspecified: Secondary | ICD-10-CM | POA: Insufficient documentation

## 2015-09-02 DIAGNOSIS — K219 Gastro-esophageal reflux disease without esophagitis: Secondary | ICD-10-CM | POA: Diagnosis not present

## 2015-09-02 DIAGNOSIS — Z8619 Personal history of other infectious and parasitic diseases: Secondary | ICD-10-CM | POA: Insufficient documentation

## 2015-09-02 DIAGNOSIS — R002 Palpitations: Secondary | ICD-10-CM | POA: Insufficient documentation

## 2015-09-02 DIAGNOSIS — I10 Essential (primary) hypertension: Secondary | ICD-10-CM | POA: Diagnosis not present

## 2015-09-02 DIAGNOSIS — M810 Age-related osteoporosis without current pathological fracture: Secondary | ICD-10-CM | POA: Insufficient documentation

## 2015-09-02 DIAGNOSIS — R110 Nausea: Secondary | ICD-10-CM | POA: Insufficient documentation

## 2015-09-02 DIAGNOSIS — Z86718 Personal history of other venous thrombosis and embolism: Secondary | ICD-10-CM | POA: Insufficient documentation

## 2015-09-02 DIAGNOSIS — Z79899 Other long term (current) drug therapy: Secondary | ICD-10-CM | POA: Diagnosis not present

## 2015-09-02 DIAGNOSIS — Z7982 Long term (current) use of aspirin: Secondary | ICD-10-CM | POA: Diagnosis not present

## 2015-09-02 DIAGNOSIS — R000 Tachycardia, unspecified: Secondary | ICD-10-CM | POA: Diagnosis not present

## 2015-09-02 DIAGNOSIS — I499 Cardiac arrhythmia, unspecified: Secondary | ICD-10-CM | POA: Diagnosis present

## 2015-09-02 DIAGNOSIS — R232 Flushing: Secondary | ICD-10-CM | POA: Diagnosis not present

## 2015-09-02 LAB — BASIC METABOLIC PANEL
Anion gap: 9 (ref 5–15)
BUN: 19 mg/dL (ref 6–20)
CO2: 26 mmol/L (ref 22–32)
Calcium: 9.2 mg/dL (ref 8.9–10.3)
Chloride: 104 mmol/L (ref 101–111)
Creatinine, Ser: 0.88 mg/dL (ref 0.44–1.00)
GFR calc Af Amer: >60 mL/min (ref >60)
GFR calc non Af Amer: 60 mL/min — ABNORMAL LOW (ref >60)
Glucose, Bld: 107 mg/dL — ABNORMAL HIGH (ref 65–99)
Potassium: 3.9 mmol/L (ref 3.5–5.1)
Sodium: 139 mmol/L (ref 135–145)

## 2015-09-02 LAB — I-STAT TROPONIN, ED
TROPONIN I, POC: 0 ng/mL (ref 0.00–0.08)
Troponin i, poc: 0.01 ng/mL (ref 0.00–0.08)

## 2015-09-02 LAB — CBC
HEMATOCRIT: 37.1 % (ref 36.0–46.0)
HEMOGLOBIN: 11.5 g/dL — AB (ref 12.0–15.0)
MCH: 26.7 pg (ref 26.0–34.0)
MCHC: 31 g/dL (ref 30.0–36.0)
MCV: 86.1 fL (ref 78.0–100.0)
Platelets: 177 10*3/uL (ref 150–400)
RBC: 4.31 MIL/uL (ref 3.87–5.11)
RDW: 15.4 % (ref 11.5–15.5)
WBC: 6.4 10*3/uL (ref 4.0–10.5)

## 2015-09-02 NOTE — ED Provider Notes (Signed)
CSN: [id number]     Arrival date & time 09/02/15  1513 History   First MD Initiated Contact with Patient 09/02/15 1639     Chief Complaint  Patient presents with  . Irregular Heart Beat     (Consider location/radiation/quality/duration/timing/severity/associated sxs/prior Treatment) HPI  2PM developed flushing, red from chest up.  EMTs thought it was reaction to something.  Felt uncomfortable, warm. No itching.  Felt heart palpitations also. No other symptoms.  Occurred while talking on the phone.  No chest pain or dyspnea.  Felt a little nausea.  No vomiting.  No diaphoresis.  Felt hot.  cortizone for 3 days over swelling right at ankle, completed days ago.  House had smelled like paint thinner per daughter in law.  Hadnt used anything like that.    Round Up and Raid used yesterday, took benicar this AM (has taken for 80yr) and nothing new prior to episode Past Medical History  Diagnosis Date  . Hypertension   . Hyperlipidemia   . DVT (deep venous thrombosis) ([place]) 1998    hx of right  . GERD (gastroesophageal reflux disease)   . Osteoporosis   . Esophagitis 2006  . Palpitation   . Kidney cysts     left- Dr [name]  . Diverticulosis of colon   . IBS (irritable bowel syndrome)   . History of shingles   . Glucose intolerance (impaired glucose tolerance)   . Impaired glucose tolerance 11/22/2010  . Multiple lung nodules 2012    seen on CT scan by Dr [name], pulmonologist   Past Surgical History  Procedure Laterality Date  . Cholecystectomy    . Tubal ligation Bilateral   . Appendectomy    . Hysteroscopy with resectoscope  7/99    resect polyp   Family History  Problem Relation Age of Onset  . Asthma Mother   . Diabetes Father   . Cancer Father     liver  . Cancer Sister     breast  . Diabetes Sister   . Asthma Sister   . Breast cancer Sister   . Diabetes Brother   . Asthma Brother   . Diabetes Brother   . Asthma Sister   . Coronary artery disease Other     1 st  degree female relative  . Thyroid disease Maternal Uncle    Social History  Substance Use Topics  . Smoking status: Never Smoker   . Smokeless tobacco: Never Used  . Alcohol Use: No   OB History    Gravida Para Term Preterm AB TAB SAB Ectopic Multiple Living   3 3 3       3      Review of Systems  Constitutional: Negative for fever.  HENT: Negative for sore throat.   Eyes: Negative for visual disturbance.  Respiratory: Negative for cough and shortness of breath.   Cardiovascular: Positive for palpitations. Negative for chest pain.  Gastrointestinal: Positive for nausea. Negative for vomiting, abdominal pain and diarrhea.  Genitourinary: Negative for difficulty urinating.  Musculoskeletal: Negative for back pain and neck pain.  Skin: Negative for rash.  Neurological: Negative for syncope, light-headedness and headaches.      Allergies  Atorvastatin; Alendronate sodium; Ceftriaxone sodium; Codeine phosphate; Moxifloxacin; and Simvastatin  Home Medications   Prior to Admission medications   Medication Sig Start Date End Date Taking? Authorizing Provider  aspirin 81 MG tablet Take 81 mg by mouth daily.     Yes Historical Provider, MD  cholecalciferol (VITAMIN D) 1000  UNITS tablet Take 1,000 Units by mouth daily.   Yes Historical Provider, MD  cyanocobalamin 1000 MCG tablet Take 1,000 mcg by mouth daily.   Yes Historical Provider, MD  famotidine (PEPCID) 20 MG tablet One at bedtime 01/02/15  Yes Tanda Rockers, MD  fish oil-omega-3 fatty acids 1000 MG capsule Take 1 g by mouth daily.     Yes Historical Provider, MD  fluticasone (FLONASE) 50 MCG/ACT nasal spray INHALE 2 SPRAYS IEN QD AT NIGHT as needed 11/13/14  Yes Historical Provider, MD  furosemide (LASIX) 20 MG tablet Take 1 tablet (20 mg total) by mouth daily as needed. 06/23/15  Yes Aleksei Plotnikov V, MD  olmesartan (BENICAR) 20 MG tablet Take 1 tablet (20 mg total) by mouth daily. 06/23/15  Yes Aleksei Plotnikov V, MD   potassium chloride (KLOR-CON 10) 10 MEQ tablet Take 1 tablet (10 mEq total) by mouth daily as needed. Take w/Furosemide 06/23/15  Yes [name], MD  Respiratory Therapy Supplies (FLUTTER) DEVI Use as directed 01/02/15  Yes Tanda Rockers, MD  pantoprazole (PROTONIX) 40 MG tablet Take 1 tablet (40 mg total) by mouth daily. Take 30-60 min before first meal of the day 01/02/15   Tanda Rockers, MD   BP 139/76 mmHg  Pulse 83  Temp(Src) 98.2 F (36.8 C) (Oral)  Resp 13  SpO2 99%  LMP 04/08/1996 Physical Exam  Constitutional: She is oriented to person, place, and time. She appears well-developed and well-nourished. No distress.  HENT:  Head: Normocephalic and atraumatic.  Eyes: Conjunctivae and EOM are normal.  Neck: Normal range of motion.  Cardiovascular: Normal rate, regular rhythm, normal heart sounds and intact distal pulses.  Exam reveals no gallop and no friction rub.   No murmur heard. Pulmonary/Chest: Effort normal and breath sounds normal. No respiratory distress. She has no wheezes. She has no rales.  Abdominal: Soft. She exhibits no distension. There is no tenderness. There is no guarding.  Musculoskeletal: She exhibits no edema or tenderness.  Neurological: She is alert and oriented to person, place, and time.  Skin: Skin is warm and dry. No rash noted. She is not diaphoretic. No erythema.  Nursing note and vitals reviewed.   ED Course  Procedures (including critical care time) Labs Review Labs Reviewed  BASIC METABOLIC PANEL - Abnormal; Notable for the following:    Glucose, Bld 107 (*)    GFR calc non Af Amer 60 (*)    All other components within normal limits  CBC - Abnormal; Notable for the following:    Hemoglobin 11.5 (*)    All other components within normal limits  I-STAT TROPOININ, ED  Randolm Idol, ED    Imaging Review Dg Chest 2 View  09/02/2015  CLINICAL DATA:  Tachycardia. Facial and neck redness and increased warmth. EXAM: CHEST  2 VIEW  COMPARISON:  02/03/2015. FINDINGS: Normal sized heart. Clear lungs. Normal vascularity. Thoracic spine degenerative changes, including changes of DISH. Diffuse osteopenia. IMPRESSION: No acute abnormality. Electronically Signed   By: Claudie Revering M.D.   On: 09/02/2015 16:21   I have personally reviewed and evaluated these images and lab results as part of my medical decision-making.   EKG Interpretation   Date/Time:  Saturday Sep 02 2015 15:29:31 EDT Ventricular Rate:  112 PR Interval:  183 QRS Duration: 88 QT Interval:  332 QTC Calculation: 453 R Axis:   -39 Text Interpretation:  Sinus tachycardia Left axis deviation Low voltage,  precordial leads Since previous tracing heart  tracing, tachycardia has  developed Confirmed by [facility] MD, [place] ([id number]) on 09/03/2015 2:01:10 AM      MDM   Final diagnoses:  Palpitations  Flushing   82 year old female with a history of hypertension, hyperlipidemia, prior history of DVT, palpitations presents with concern of episode of palpitations with facial flushing and nausea. Patient with sinus tachycardia on arrival to the emergency department, which resolves spontaneously. Patient without uritis, no history to suggest hives or other symptoms as suggest anaphylaxis. Have low suspicion for pulmonary embolus given patient with episode of these symptoms, which resolved spontaneously, and she has no hypoxia, no tachypnea, no shortness of breath. Patient with a sinus tachycardia on arrival to the emergency department, however in differential is other cause of cardiac arrhythmia which was not present on arrival. Symptoms may also be consistent within it anticholinergic toxidrome, however does not clear patient had any exposures to result in any symptoms. Doubt hyperthyroidism given pt improvement. Delta troponins negative and doubt ACS or anginal equivalent.  Would consider other environmental reaction, cardiac arrhythmia, or panic attack in differential.   Recommend follow up with PCP and Cardiology.   Gareth Morgan, MD 09/03/15 [phone]

## 2015-09-02 NOTE — ED Notes (Signed)
Pt. Was at home sitting at the table and began feeling flushed from her neck up and felt her heart racing.  She denies any n/v/d denies any sob.   Presently , she continues to have face redness  No hives or rash noted.  Pt. Is alert and oriented X4. Hr, 106.  Lt, soreness under her lt. Breast she relates it to picking up her grandson last weekend.   ECG completed in triage

## 2015-09-02 NOTE — ED Notes (Signed)
Pt. Is feeling better  Pt.s flushed and redness to her neck and face has completely resolved.  Pt. s daughter in law informed this RN that when she went to pt.s house to check on her medication, she reports that the house smelled like paint thinner.  It was very strong.

## 2015-09-02 NOTE — Discharge Instructions (Signed)

## 2015-09-12 ENCOUNTER — Encounter: Payer: Self-pay | Admitting: Internal Medicine

## 2015-09-12 ENCOUNTER — Ambulatory Visit (INDEPENDENT_AMBULATORY_CARE_PROVIDER_SITE_OTHER): Payer: Medicare Other | Admitting: Internal Medicine

## 2015-09-12 VITALS — BP 110/64 | HR 90 | Wt 168.0 lb

## 2015-09-12 DIAGNOSIS — R21 Rash and other nonspecific skin eruption: Secondary | ICD-10-CM

## 2015-09-12 DIAGNOSIS — R1030 Lower abdominal pain, unspecified: Secondary | ICD-10-CM | POA: Diagnosis not present

## 2015-09-12 HISTORY — DX: Rash and other nonspecific skin eruption: R21

## 2015-09-12 MED ORDER — LORATADINE 10 MG PO TABS: 10.0000 mg | ORAL_TABLET | Freq: Every day | ORAL | Status: DC

## 2015-09-12 NOTE — Progress Notes (Signed)
Subjective:  Patient ID: Diana Stout, female    DOB: May 11, 1930  Age: 82 y.o. MRN: [id number]  CC: No chief complaint on file.   HPI SHAWNALEE PENUELAS presents for eating a bad cantaloupe on 5/27 - neck, face were red, had palpitations x few hrs - it went away in the ER. L leg was red on 5/22 - resolved on cortisone cream in 2-3 days. Pt had abd cramps too Dtr is allergic to seafood - the pt is on MegaRed krill oil There was a tic on L leg on 5/29 - tiny... She is tired but back to nl  Outpatient Prescriptions Prior to Visit  Medication Sig Dispense Refill  . aspirin 81 MG tablet Take 81 mg by mouth daily.      . cholecalciferol (VITAMIN D) 1000 UNITS tablet Take 1,000 Units by mouth daily.    . cyanocobalamin 1000 MCG tablet Take 1,000 mcg by mouth daily.    . fish oil-omega-3 fatty acids 1000 MG capsule Take 1 g by mouth daily.      . furosemide (LASIX) 20 MG tablet Take 1 tablet (20 mg total) by mouth daily as needed. 30 tablet 3  . olmesartan (BENICAR) 20 MG tablet Take 1 tablet (20 mg total) by mouth daily. 30 tablet 11  . potassium chloride (KLOR-CON 10) 10 MEQ tablet Take 1 tablet (10 mEq total) by mouth daily as needed. Take w/Furosemide 30 tablet 3  . Respiratory Therapy Supplies (FLUTTER) DEVI Use as directed 1 each 0  . famotidine (PEPCID) 20 MG tablet One at bedtime (Patient not taking: Reported on 09/12/2015) 30 tablet 2  . fluticasone (FLONASE) 50 MCG/ACT nasal spray Reported on 09/12/2015  5  . pantoprazole (PROTONIX) 40 MG tablet Take 1 tablet (40 mg total) by mouth daily. Take 30-60 min before first meal of the day (Patient not taking: Reported on 09/12/2015) 30 tablet 2   No facility-administered medications prior to visit.    ROS Review of Systems  Constitutional: Negative for chills, activity change, appetite change, fatigue and unexpected weight change.  HENT: Negative for congestion, mouth sores and sinus pressure.   Eyes: Negative for visual disturbance.    Respiratory: Negative for cough and chest tightness.   Gastrointestinal: Negative for nausea and abdominal pain.  Genitourinary: Negative for frequency, difficulty urinating and vaginal pain.  Musculoskeletal: Negative for back pain and gait problem.  Skin: Positive for rash. Negative for pallor.  Neurological: Negative for dizziness, tremors, weakness, numbness and headaches.  Psychiatric/Behavioral: Negative for confusion and sleep disturbance.    Objective:  BP 110/64 mmHg  Pulse 90  Wt 168 lb (76.204 kg)  SpO2 98%  LMP 04/08/1996  BP Readings from Last 3 Encounters:  09/12/15 110/64  09/02/15 139/76  07/23/15 139/85    Wt Readings from Last 3 Encounters:  09/12/15 168 lb (76.204 kg)  05/09/15 169 lb (76.658 kg)  02/03/15 166 lb 12.8 oz (75.66 kg)    Physical Exam  Constitutional: She appears well-developed. No distress.  HENT:  Head: Normocephalic.  Right Ear: External ear normal.  Left Ear: External ear normal.  Nose: Nose normal.  Mouth/Throat: Oropharynx is clear and moist.  Eyes: Conjunctivae are normal. Pupils are equal, round, and reactive to light. Right eye exhibits no discharge. Left eye exhibits no discharge.  Neck: Normal range of motion. Neck supple. No JVD present. No tracheal deviation present. No thyromegaly present.  Cardiovascular: Normal rate, regular rhythm and normal heart sounds.   Pulmonary/Chest:  No stridor. No respiratory distress. She has no wheezes.  Abdominal: Soft. Bowel sounds are normal. She exhibits no distension and no mass. There is no tenderness. There is no rebound and no guarding.  Musculoskeletal: She exhibits no edema or tenderness.  Lymphadenopathy:    She has no cervical adenopathy.  Neurological: She displays normal reflexes. No cranial nerve deficit. She exhibits normal muscle tone. Coordination normal.  Skin: No rash noted. No erythema.  Psychiatric: She has a normal mood and affect. Her behavior is normal. Judgment and  thought content normal.    Lab Results  Component Value Date   WBC 6.4 09/02/2015   HGB 11.5* 09/02/2015   HCT 37.1 09/02/2015   PLT 177 09/02/2015   GLUCOSE 107* 09/02/2015   CHOL 216* 03/23/2014   TRIG 87.0 03/23/2014   HDL 38.30* 03/23/2014   LDLDIRECT 154.9 05/01/2011   LDLCALC 160* 03/23/2014   ALT 16 05/09/2015   AST 20 05/09/2015   NA 139 09/02/2015   K 3.9 09/02/2015   CL 104 09/02/2015   CREATININE 0.88 09/02/2015   BUN 19 09/02/2015   CO2 26 09/02/2015   TSH 0.44 05/09/2015   HGBA1C 5.9 05/09/2015    Dg Chest 2 View  09/02/2015  CLINICAL DATA:  Tachycardia. Facial and neck redness and increased warmth. EXAM: CHEST  2 VIEW COMPARISON:  02/03/2015. FINDINGS: Normal sized heart. Clear lungs. Normal vascularity. Thoracic spine degenerative changes, including changes of DISH. Diffuse osteopenia. IMPRESSION: No acute abnormality. Electronically Signed   By: Claudie Revering M.D.   On: 09/02/2015 16:21    Assessment & Plan:   There are no diagnoses linked to this encounter. I am having Ms. Petrenko maintain her aspirin, fish oil-omega-3 fatty acids, cholecalciferol, fluticasone, FLUTTER, pantoprazole, famotidine, cyanocobalamin, furosemide, potassium chloride, and olmesartan.  No orders of the defined types were placed in this encounter.     Follow-up: No Follow-up on file.  Walker Kehr, MD

## 2015-09-12 NOTE — Progress Notes (Signed)
Pre visit review using our clinic review tool, if applicable. No additional management support is needed unless otherwise documented below in the visit note. 

## 2015-09-13 ENCOUNTER — Encounter: Payer: Self-pay | Admitting: Internal Medicine

## 2015-09-13 NOTE — Assessment & Plan Note (Addendum)
?  urticaria w/GI upset: Dtr is allergic to seafood - the pt is on MegaRed krill oil - d/c MegaRed Claritin qd

## 2015-09-13 NOTE — Assessment & Plan Note (Signed)
?  urticaria Dtr is allergic to seafood - the pt is on MegaRed krill oil - d/c MegaRed Claritin qd

## 2015-09-13 NOTE — Patient Instructions (Signed)
claritin daily Stop MegaRed

## 2015-10-18 DIAGNOSIS — L814 Other melanin hyperpigmentation: Secondary | ICD-10-CM | POA: Diagnosis not present

## 2015-10-18 DIAGNOSIS — D1801 Hemangioma of skin and subcutaneous tissue: Secondary | ICD-10-CM | POA: Diagnosis not present

## 2015-10-18 DIAGNOSIS — D225 Melanocytic nevi of trunk: Secondary | ICD-10-CM | POA: Diagnosis not present

## 2015-10-18 DIAGNOSIS — Z85828 Personal history of other malignant neoplasm of skin: Secondary | ICD-10-CM | POA: Diagnosis not present

## 2015-10-18 DIAGNOSIS — L820 Inflamed seborrheic keratosis: Secondary | ICD-10-CM | POA: Diagnosis not present

## 2015-10-18 DIAGNOSIS — L821 Other seborrheic keratosis: Secondary | ICD-10-CM | POA: Diagnosis not present

## 2015-10-18 DIAGNOSIS — L570 Actinic keratosis: Secondary | ICD-10-CM | POA: Diagnosis not present

## 2015-10-25 ENCOUNTER — Encounter: Payer: Self-pay | Admitting: Internal Medicine

## 2015-10-25 ENCOUNTER — Ambulatory Visit (INDEPENDENT_AMBULATORY_CARE_PROVIDER_SITE_OTHER): Payer: Medicare Other | Admitting: Internal Medicine

## 2015-10-25 VITALS — BP 110/68 | HR 88 | Wt 166.0 lb

## 2015-10-25 DIAGNOSIS — E669 Obesity, unspecified: Secondary | ICD-10-CM

## 2015-10-25 DIAGNOSIS — K219 Gastro-esophageal reflux disease without esophagitis: Secondary | ICD-10-CM

## 2015-10-25 DIAGNOSIS — R9389 Abnormal findings on diagnostic imaging of other specified body structures: Secondary | ICD-10-CM

## 2015-10-25 DIAGNOSIS — R938 Abnormal findings on diagnostic imaging of other specified body structures: Secondary | ICD-10-CM | POA: Diagnosis not present

## 2015-10-25 DIAGNOSIS — R059 Cough, unspecified: Secondary | ICD-10-CM

## 2015-10-25 DIAGNOSIS — I10 Essential (primary) hypertension: Secondary | ICD-10-CM

## 2015-10-25 DIAGNOSIS — R05 Cough: Secondary | ICD-10-CM

## 2015-10-25 NOTE — Assessment & Plan Note (Signed)
Doing well 

## 2015-10-25 NOTE — Progress Notes (Signed)
Subjective:  Patient ID: Diana Stout, female    DOB: 18-Sep-1930  Age: 82 y.o. MRN: [id number]  CC: No chief complaint on file.   HPI CHEYNEY VELETA presents for rash f/u, HTN, LBP f/u. No more rash - she is back on krill oil  Outpatient Prescriptions Prior to Visit  Medication Sig Dispense Refill  . aspirin 81 MG tablet Take 81 mg by mouth daily.      . cholecalciferol (VITAMIN D) 1000 UNITS tablet Take 1,000 Units by mouth daily.    . cyanocobalamin 1000 MCG tablet Take 1,000 mcg by mouth daily.    . fluticasone (FLONASE) 50 MCG/ACT nasal spray Reported on 09/12/2015  5  . furosemide (LASIX) 20 MG tablet Take 1 tablet (20 mg total) by mouth daily as needed. 30 tablet 3  . loratadine (CLARITIN) 10 MG tablet Take 1 tablet (10 mg total) by mouth daily. 100 tablet 3  . olmesartan (BENICAR) 20 MG tablet Take 1 tablet (20 mg total) by mouth daily. 30 tablet 11  . potassium chloride (KLOR-CON 10) 10 MEQ tablet Take 1 tablet (10 mEq total) by mouth daily as needed. Take w/Furosemide 30 tablet 3  . Respiratory Therapy Supplies (FLUTTER) DEVI Use as directed 1 each 0   No facility-administered medications prior to visit.    ROS Review of Systems  Constitutional: Negative for chills, activity change, appetite change, fatigue and unexpected weight change.  HENT: Negative for congestion, mouth sores and sinus pressure.   Eyes: Negative for visual disturbance.  Respiratory: Negative for cough and chest tightness.   Gastrointestinal: Negative for nausea and abdominal pain.  Genitourinary: Negative for frequency, difficulty urinating and vaginal pain.  Musculoskeletal: Positive for back pain. Negative for gait problem.  Skin: Negative for pallor and rash.  Neurological: Negative for dizziness, tremors, weakness, numbness and headaches.  Psychiatric/Behavioral: Negative for suicidal ideas, confusion and sleep disturbance.    Objective:  BP 110/68 mmHg  Pulse 88  Wt 166 lb (75.297 kg)   SpO2 94%  LMP 04/08/1996  BP Readings from Last 3 Encounters:  10/25/15 110/68  09/12/15 110/64  09/02/15 139/76    Wt Readings from Last 3 Encounters:  10/25/15 166 lb (75.297 kg)  09/12/15 168 lb (76.204 kg)  05/09/15 169 lb (76.658 kg)    Physical Exam  Constitutional: She appears well-developed. No distress.  HENT:  Head: Normocephalic.  Right Ear: External ear normal.  Left Ear: External ear normal.  Nose: Nose normal.  Mouth/Throat: Oropharynx is clear and moist.  Eyes: Conjunctivae are normal. Pupils are equal, round, and reactive to light. Right eye exhibits no discharge. Left eye exhibits no discharge.  Neck: Normal range of motion. Neck supple. No JVD present. No tracheal deviation present. No thyromegaly present.  Cardiovascular: Normal rate, regular rhythm and normal heart sounds.   Pulmonary/Chest: No stridor. No respiratory distress. She has no wheezes.  Abdominal: Soft. Bowel sounds are normal. She exhibits no distension and no mass. There is no tenderness. There is no rebound and no guarding.  Musculoskeletal: She exhibits no edema or tenderness.  Lymphadenopathy:    She has no cervical adenopathy.  Neurological: She displays normal reflexes. No cranial nerve deficit. She exhibits normal muscle tone. Coordination normal.  Skin: No rash noted. No erythema.  Psychiatric: She has a normal mood and affect. Her behavior is normal. Judgment and thought content normal.  Obese  Lab Results  Component Value Date   WBC 6.4 09/02/2015   HGB 11.5*  09/02/2015   HCT 37.1 09/02/2015   PLT 177 09/02/2015   GLUCOSE 107* 09/02/2015   CHOL 216* 03/23/2014   TRIG 87.0 03/23/2014   HDL 38.30* 03/23/2014   LDLDIRECT 154.9 05/01/2011   LDLCALC 160* 03/23/2014   ALT 16 05/09/2015   AST 20 05/09/2015   NA 139 09/02/2015   K 3.9 09/02/2015   CL 104 09/02/2015   CREATININE 0.88 09/02/2015   BUN 19 09/02/2015   CO2 26 09/02/2015   TSH 0.44 05/09/2015   HGBA1C 5.9  05/09/2015    Dg Chest 2 View  09/02/2015  CLINICAL DATA:  Tachycardia. Facial and neck redness and increased warmth. EXAM: CHEST  2 VIEW COMPARISON:  02/03/2015. FINDINGS: Normal sized heart. Clear lungs. Normal vascularity. Thoracic spine degenerative changes, including changes of DISH. Diffuse osteopenia. IMPRESSION: No acute abnormality. Electronically Signed   By: Claudie Revering M.D.   On: 09/02/2015 16:21    Assessment & Plan:   There are no diagnoses linked to this encounter. I am having Ms. Meany maintain her aspirin, cholecalciferol, fluticasone, FLUTTER, cyanocobalamin, furosemide, potassium chloride, olmesartan, and loratadine.  No orders of the defined types were placed in this encounter.     Follow-up: No Follow-up on file.  Walker Kehr, MD

## 2015-10-25 NOTE — Assessment & Plan Note (Signed)
Resolved w/a wedge pillow

## 2015-10-25 NOTE — Progress Notes (Signed)
Pre visit review using our clinic review tool, if applicable. No additional management support is needed unless otherwise documented below in the visit note. 

## 2015-10-25 NOTE — Assessment & Plan Note (Signed)
  On diet  

## 2015-10-25 NOTE — Assessment & Plan Note (Signed)
F/u w/Dr [name] prn

## 2015-10-25 NOTE — Assessment & Plan Note (Signed)
Not on Rx - wedge pillow

## 2015-11-01 ENCOUNTER — Ambulatory Visit: Payer: Medicare Other | Admitting: Internal Medicine

## 2015-12-26 ENCOUNTER — Ambulatory Visit (INDEPENDENT_AMBULATORY_CARE_PROVIDER_SITE_OTHER): Payer: Medicare Other | Admitting: Internal Medicine

## 2015-12-26 ENCOUNTER — Encounter: Payer: Self-pay | Admitting: Internal Medicine

## 2015-12-26 DIAGNOSIS — J069 Acute upper respiratory infection, unspecified: Secondary | ICD-10-CM

## 2015-12-26 MED ORDER — FLUTICASONE PROPIONATE 50 MCG/ACT NA SUSP: 2.0000 | Freq: Every day | NASAL | 5 refills | Status: DC

## 2015-12-26 NOTE — Progress Notes (Signed)
   Subjective:    Patient ID: Diana Stout, female    DOB: 02-09-1931, 82 y.o.   MRN: [id number]  HPI The patient is an 81 YO female coming in for chest congestion for about 8 days. She is taking leftover antibiotics which stopped yesterday after 5 days of taking. She was keeping a grandchild which had the sniffles and drainage and then she got the symptoms. She is having mild cough with clear sputum, nose drainage. Denies chills or fever or SOB. No ear pain or drainage. She is not taking flonase or claritin which are on her list. She is taking robitussin for cough which is not working well. She also found some leftover tessalon perles which she has started using. Overall she feels symptoms are improving.   Review of Systems  Constitutional: Negative for activity change, appetite change, chills, fatigue, fever and unexpected weight change.  HENT: Positive for congestion and postnasal drip. Negative for ear discharge, ear pain, rhinorrhea, sinus pressure, sore throat and trouble swallowing.   Eyes: Negative.   Respiratory: Positive for cough. Negative for chest tightness, shortness of breath and wheezing.   Cardiovascular: Negative.   Gastrointestinal: Negative.       Objective:   Physical Exam  Constitutional: She appears well-developed and well-nourished. No distress.  HENT:  Head: Normocephalic and atraumatic.  TMs normal bilaterally. Oropharynx with redness and clear drainage.   Eyes: EOM are normal.  Neck: Normal range of motion.  Cardiovascular: Normal rate and regular rhythm.   Pulmonary/Chest: Effort normal and breath sounds normal. No respiratory distress. She has no wheezes. She has no rales.  Abdominal: Soft. She exhibits no distension. There is no tenderness.  Lymphadenopathy:    She has no cervical adenopathy.  Skin: Skin is warm and dry.   Vitals:   12/26/15 1636  BP: 118/70  Pulse: 96  Temp: 97.6 F (36.4 C)  TempSrc: Oral  SpO2: 96%  Weight: 169 lb 8 oz (76.9  kg)  Height: 4\' 11"  (1.499 m)      Assessment & Plan:

## 2015-12-26 NOTE — Patient Instructions (Addendum)
We are sending in the flonase (fluticasone) which is a nose spray for the symptoms. Use 2 sprays in each nostril once a day.  Also start taking the benzonatate (tessalon perles) that you can use up to 3 times per day for cough as needed.    Upper Respiratory Infection, Adult Most upper respiratory infections (URIs) are a viral infection of the air passages leading to the lungs. A URI affects the nose, throat, and upper air passages. The most common type of URI is nasopharyngitis and is typically referred to as "the common cold." URIs run their course and usually go away on their own. Most of the time, a URI does not require medical attention, but sometimes a bacterial infection in the upper airways can follow a viral infection. This is called a secondary infection. Sinus and middle ear infections are common types of secondary upper respiratory infections. Bacterial pneumonia can also complicate a URI. A URI can worsen asthma and chronic obstructive pulmonary disease (COPD). Sometimes, these complications can require emergency medical care and may be life threatening.  CAUSES Almost all URIs are caused by viruses. A virus is a type of germ and can spread from one person to another.  RISKS FACTORS You may be at risk for a URI if:   You smoke.   You have chronic heart or lung disease.  You have a weakened defense (immune) system.   You are very young or very old.   You have nasal allergies or asthma.  You work in crowded or poorly ventilated areas.  You work in health care facilities or schools. SIGNS AND SYMPTOMS  Symptoms typically develop 2-3 days after you come in contact with a cold virus. Most viral URIs last 7-10 days. However, viral URIs from the influenza virus (flu virus) can last 14-18 days and are typically more severe. Symptoms may include:   Runny or stuffy (congested) nose.   Sneezing.   Cough.   Sore throat.   Headache.   Fatigue.   Fever.   Loss of  appetite.   Pain in your forehead, behind your eyes, and over your cheekbones (sinus pain).  Muscle aches.  DIAGNOSIS  Your health care provider may diagnose a URI by:  Physical exam.  Tests to check that your symptoms are not due to another condition such as:  Strep throat.  Sinusitis.  Pneumonia.  Asthma. TREATMENT  A URI goes away on its own with time. It cannot be cured with medicines, but medicines may be prescribed or recommended to relieve symptoms. Medicines may help:  Reduce your fever.  Reduce your cough.  Relieve nasal congestion. HOME CARE INSTRUCTIONS   Take medicines only as directed by your health care provider.   Gargle warm saltwater or take cough drops to comfort your throat as directed by your health care provider.  Use a warm mist humidifier or inhale steam from a shower to increase air moisture. This may make it easier to breathe.  Drink enough fluid to keep your urine clear or pale yellow.   Eat soups and other clear broths and maintain good nutrition.   Rest as needed.   Return to work when your temperature has returned to normal or as your health care provider advises. You may need to stay home longer to avoid infecting others. You can also use a face mask and careful hand washing to prevent spread of the virus.  Increase the usage of your inhaler if you have asthma.   Do not  use any tobacco products, including cigarettes, chewing tobacco, or electronic cigarettes. If you need help quitting, ask your health care provider. PREVENTION  The best way to protect yourself from getting a cold is to practice good hygiene.   Avoid oral or hand contact with people with cold symptoms.   Wash your hands often if contact occurs.  There is no clear evidence that vitamin C, vitamin E, echinacea, or exercise reduces the chance of developing a cold. However, it is always recommended to get plenty of rest, exercise, and practice good nutrition.   SEEK MEDICAL CARE IF:   You are getting worse rather than better.   Your symptoms are not controlled by medicine.   You have chills.  You have worsening shortness of breath.  You have brown or red mucus.  You have yellow or brown nasal discharge.  You have pain in your face, especially when you bend forward.  You have a fever.  You have swollen neck glands.  You have pain while swallowing.  You have white areas in the back of your throat. SEEK IMMEDIATE MEDICAL CARE IF:   You have severe or persistent:  Headache.  Ear pain.  Sinus pain.  Chest pain.  You have chronic lung disease and any of the following:  Wheezing.  Prolonged cough.  Coughing up blood.  A change in your usual mucus.  You have a stiff neck.  You have changes in your:  Vision.  Hearing.  Thinking.  Mood. MAKE SURE YOU:   Understand these instructions.  Will watch your condition.  Will get help right away if you are not doing well or get worse.   This information is not intended to replace advice given to you by your health care provider. Make sure you discuss any questions you have with your health care provider.   Document Released: 09/18/2000 Document Revised: 08/09/2014 Document Reviewed: 06/30/2013 Elsevier Interactive Patient Education [employer].

## 2015-12-26 NOTE — Assessment & Plan Note (Signed)
She is strongly encouraged not to take leftover antibiotics and reminded that she should not have leftover as she is supposed to take the full course of any prescribed antibiotics. She will start using flonase (new rx sent in) and can continue taking tessalon perles and robitussin if needed. She asked if she needed more antibiotics and reassured her that she did not. Explained the usual course of cough symptoms.

## 2015-12-26 NOTE — Progress Notes (Signed)
Pre visit review using our clinic review tool, if applicable. No additional management support is needed unless otherwise documented below in the visit note. 

## 2016-01-17 DIAGNOSIS — Z23 Encounter for immunization: Secondary | ICD-10-CM | POA: Diagnosis not present

## 2016-02-07 DIAGNOSIS — Z1231 Encounter for screening mammogram for malignant neoplasm of breast: Secondary | ICD-10-CM | POA: Diagnosis not present

## 2016-02-07 DIAGNOSIS — Z803 Family history of malignant neoplasm of breast: Secondary | ICD-10-CM | POA: Diagnosis not present

## 2016-02-16 ENCOUNTER — Encounter: Payer: Self-pay | Admitting: Obstetrics & Gynecology

## 2016-02-16 ENCOUNTER — Ambulatory Visit (INDEPENDENT_AMBULATORY_CARE_PROVIDER_SITE_OTHER): Payer: Medicare Other | Admitting: Obstetrics & Gynecology

## 2016-02-16 VITALS — BP 118/80 | HR 88 | Resp 16 | Ht 58.5 in | Wt 167.0 lb

## 2016-02-16 DIAGNOSIS — Z124 Encounter for screening for malignant neoplasm of cervix: Secondary | ICD-10-CM | POA: Diagnosis not present

## 2016-02-16 DIAGNOSIS — Z01419 Encounter for gynecological examination (general) (routine) without abnormal findings: Secondary | ICD-10-CM | POA: Diagnosis not present

## 2016-02-16 NOTE — Progress Notes (Signed)
82 y.o. [id number] WidowedCaucasianF here for annual exam.  Denies vaginal bleeding.  Reports having some issues with diarrhea.  H/O IBS.  Having some diarrhea issues.  Seeing Dr. Havery Moros for this.  Has upcoming appt.    Patient's last menstrual period was 04/08/1996.          Sexually active: No.  The current method of family planning is post menopausal status.    Exercising: No.  Home exercise routine includes gardening. Smoker:  no  Health Maintenance: Pap:  11/05/13 negative  History of abnormal Pap:  no MMG: 02/07/16 BIRADS1:neg  Colonoscopy:  05/29/10 polyps- repeat 10 years  BMD:   11/17/14 osteopenia  TDaP:  08/25/15  Pneumonia vaccine(s):  02/18/05, 03/09/13  Zostavax:   02/02/07  Hep C testing: not indicated  Screening Labs: PCP, Urine today: PCP   reports that she has never smoked. She has never used smokeless tobacco. She reports that she does not drink alcohol or use drugs.  Past Medical History:  Diagnosis Date  . Diverticulosis of colon   . DVT (deep venous thrombosis) ([place]) 1998   hx of right  . Esophagitis 2006  . GERD (gastroesophageal reflux disease)   . Glucose intolerance (impaired glucose tolerance)   . History of shingles   . Hyperlipidemia   . Hypertension   . IBS (irritable bowel syndrome)   . Impaired glucose tolerance 11/22/2010  . Kidney cysts    left- Dr [name]  . Multiple lung nodules 2012   seen on CT scan by Dr [name], pulmonologist  . Osteoporosis   . Palpitation     Past Surgical History:  Procedure Laterality Date  . APPENDECTOMY    . CHOLECYSTECTOMY    . HYSTEROSCOPY WITH RESECTOSCOPE  7/99   resect polyp  . TUBAL LIGATION Bilateral     Current Outpatient Prescriptions  Medication Sig Dispense Refill  . aspirin 81 MG tablet Take 81 mg by mouth daily.      . cholecalciferol (VITAMIN D) 1000 UNITS tablet Take 1,000 Units by mouth daily.    . cyanocobalamin 1000 MCG tablet Take 1,000 mcg by mouth daily.    . fluticasone (FLONASE)  50 MCG/ACT nasal spray Place 2 sprays into both nostrils daily. 16 g 5  . furosemide (LASIX) 20 MG tablet Take 1 tablet (20 mg total) by mouth daily as needed. 30 tablet 3  . olmesartan (BENICAR) 20 MG tablet Take 1 tablet (20 mg total) by mouth daily. (Patient taking differently: Take 10 mg by mouth daily. ) 30 tablet 11  . potassium chloride (KLOR-CON 10) 10 MEQ tablet Take 1 tablet (10 mEq total) by mouth daily as needed. Take w/Furosemide 30 tablet 3  . Respiratory Therapy Supplies (FLUTTER) DEVI Use as directed 1 each 0   No current facility-administered medications for this visit.     Family History  Problem Relation Age of Onset  . Asthma Mother   . Diabetes Father   . Cancer Father     liver  . Cancer Sister     breast  . Diabetes Sister   . Asthma Sister   . Breast cancer Sister   . Diabetes Brother   . Asthma Brother   . Asthma Sister   . Diabetes Brother   . Coronary artery disease Other     1 st degree female relative  . Thyroid disease Maternal Uncle     ROS:  Pertinent items are noted in HPI.  Otherwise, a comprehensive ROS was negative.  Exam:   BP 118/80 (BP Location: Right Arm, Patient Position: Sitting, Cuff Size: Large)   Pulse 88   Resp 16   Ht 4' 10.5" (1.486 m)   Wt 167 lb (75.8 kg)   LMP 04/08/1996   BMI 34.31 kg/m   Wt: -5#  Height: 4' 10.5" (148.6 cm)  Ht Readings from Last 3 Encounters:  02/16/16 4' 10.5" (1.486 m)  12/26/15 4\' 11"  (1.499 m)  06/23/15 4\' 11"  (1.499 m)   General appearance: alert, cooperative and appears stated age Head: Normocephalic, without obvious abnormality, atraumatic Neck: no adenopathy, supple, symmetrical, trachea midline and thyroid normal to inspection and palpation Lungs: clear to auscultation bilaterally Breasts: normal appearance, no masses or tenderness Heart: regular rate and rhythm Abdomen: soft, non-tender; bowel sounds normal; no masses,  no organomegaly Extremities: extremities normal, atraumatic, no  cyanosis or edema Skin: Skin color, texture, turgor normal. No rashes or lesions Lymph nodes: Cervical, supraclavicular, and axillary nodes normal. No abnormal inguinal nodes palpated Neurologic: Grossly normal   Pelvic: External genitalia:  no lesions              Urethra:  normal appearing urethra with no masses, tenderness or lesions              Bartholins and Skenes: normal                 Vagina: normal appearing vagina with normal color and discharge, no lesions              Cervix: no lesions              Pap taken: Yes.   Bimanual Exam:  Uterus:  normal size, contour, position, consistency, mobility, non-tender              Adnexa: normal adnexa and no mass, fullness, tenderness               Rectovaginal: Confirms               Anus:  normal sphincter tone, no lesions  Chaperone was present for exam.  A:  Well Woman with normal exam PMP, no HRT H/O osteopenia Incomplete uterine prolapse with cystocele  P: Mammogram yearly Pap obtained today.  Guidelines reviewed.  Pt does not want to stop having Pap smears. Labs/vaccines with PCP return annually or prn

## 2016-02-19 LAB — IPS PAP SMEAR ONLY

## 2016-02-20 ENCOUNTER — Encounter: Payer: Self-pay | Admitting: Obstetrics & Gynecology

## 2016-03-05 ENCOUNTER — Encounter: Payer: Self-pay | Admitting: Gastroenterology

## 2016-03-05 ENCOUNTER — Ambulatory Visit (INDEPENDENT_AMBULATORY_CARE_PROVIDER_SITE_OTHER): Payer: Medicare Other | Admitting: Gastroenterology

## 2016-03-05 VITALS — BP 134/78 | HR 72 | Ht 58.5 in | Wt 166.5 lb

## 2016-03-05 DIAGNOSIS — R32 Unspecified urinary incontinence: Secondary | ICD-10-CM

## 2016-03-05 DIAGNOSIS — R159 Full incontinence of feces: Secondary | ICD-10-CM

## 2016-03-05 DIAGNOSIS — J22 Unspecified acute lower respiratory infection: Secondary | ICD-10-CM | POA: Diagnosis not present

## 2016-03-05 DIAGNOSIS — R0602 Shortness of breath: Secondary | ICD-10-CM | POA: Diagnosis not present

## 2016-03-05 NOTE — Patient Instructions (Addendum)
If you are age 80 or older, your body mass index should be between 23-30. Your Body mass index is 34.21 kg/m. If this is out of the aforementioned range listed, please consider follow up with your Primary Care Provider.  If you are age 74 or younger, your body mass index should be between 19-25. Your Body mass index is 34.21 kg/m. If this is out of the aformentioned range listed, please consider follow up with your Primary Care Provider.   We have given you a Low FODMAP Diet  Please purchase Immodium over the counter at your pharmacy. You can use this as needed.  Please purchase a daily fiber supplement such as Citracal.  Dr Havery Moros has recommended Physical Therapy for pelvic floor dysfunction. Your appointment has been scheduled with [facility] for Tuesday December 5th at 2:00pm. If you need to reschedule this appointment please call [phone].

## 2016-03-05 NOTE — Progress Notes (Signed)
HPI :  82 y/o female with a prior history of diverticulosis/GERD/IBS, former patient of Dr. Olevia Perches, new to me, here for changes in bowel habits  She reports she has had episodes of where she has acute onset of LLQ cramping associated with severe urgency of her stools, and fecal incontinens. She reports once every 2-3 months or so this occurs, usually when out of the house or eating different foods. Stools are very loose when this occurs. No precipitating factors otherwise. She reports regular bowel habits normally, she denies any baseline constipation or loose stools. She denies any leakage of her stools otherwise. She has 1-2 BMs per day at baseline. She reports normal form. She has some mild cramping in the LLQ which can come and go. Episodes can often occur when she is out of the house. She reports some specific foods can precipitate symptoms such as eating kale. She denies any blood in the stools. No FH of colon cancer. She reports these episodes ongoing for 5 years or so at least. She denies fiber supplement. She otherwise endorses some urinary incontinence about once per day, which is longstanding. She has seen pelvic floor PT a few years ago for urinary incontinence and it helped at the time. Last colonoscopy in 2012 showed no adenomas or inflammatory changes.   She was recently given Augmentin for URI this AM.   Colonoscopy 05/29/2010 - 2 small left sided hyperplastic polyps, diverticulosis EGD 2006 - mild esophagitis, distal esophageal stricture due to reflux, bx negative for barrett's  Past Medical History:  Diagnosis Date  . Diverticulosis of colon   . DVT (deep venous thrombosis) ([place]) 1998   hx of right  . Esophagitis 2006  . GERD (gastroesophageal reflux disease)   . Glucose intolerance (impaired glucose tolerance)   . History of shingles   . Hyperlipidemia   . Hypertension   . IBS (irritable bowel syndrome)   . Impaired glucose tolerance 11/22/2010  . Kidney cysts    left- Dr  [name]  . Multiple lung nodules 2012   seen on CT scan by Dr [name], pulmonologist  . Osteoporosis   . Palpitation      Past Surgical History:  Procedure Laterality Date  . APPENDECTOMY    . CHOLECYSTECTOMY    . HYSTEROSCOPY WITH RESECTOSCOPE  7/99   resect polyp  . TUBAL LIGATION Bilateral    Family History  Problem Relation Age of Onset  . Asthma Mother   . Diabetes Father   . Cancer Father     liver  . Cancer Sister     breast  . Diabetes Sister   . Asthma Sister   . Breast cancer Sister   . Diabetes Brother   . Asthma Brother   . Asthma Sister   . Diabetes Brother   . Coronary artery disease Other     1 st degree female relative  . Thyroid disease Maternal Uncle    Social History  Substance Use Topics  . Smoking status: Never Smoker  . Smokeless tobacco: Never Used  . Alcohol use No   Current Outpatient Prescriptions  Medication Sig Dispense Refill  . aspirin 81 MG tablet Take 81 mg by mouth daily.      . cholecalciferol (VITAMIN D) 1000 UNITS tablet Take 1,000 Units by mouth daily.    . cyanocobalamin 1000 MCG tablet Take 1,000 mcg by mouth daily.    . fluticasone (FLONASE) 50 MCG/ACT nasal spray Place 2 sprays into both nostrils daily. [place]  g 5  . furosemide (LASIX) 20 MG tablet Take 1 tablet (20 mg total) by mouth daily as needed. 30 tablet 3  . olmesartan (BENICAR) 20 MG tablet Take 1 tablet (20 mg total) by mouth daily. (Patient taking differently: Take 10 mg by mouth daily. ) 30 tablet 11  . potassium chloride (KLOR-CON 10) 10 MEQ tablet Take 1 tablet (10 mEq total) by mouth daily as needed. Take w/Furosemide 30 tablet 3  . Respiratory Therapy Supplies (FLUTTER) DEVI Use as directed 1 each 0   No current facility-administered medications for this visit.    Allergies  Allergen Reactions  . Atorvastatin Nausea And Vomiting    REACTION: Gi pain  . Alendronate Sodium Other (See Comments)    REACTION: esophagus problem  . Ceftriaxone Sodium Rash     REACTION: rash  . Codeine Phosphate Other (See Comments)    REACTION: nightmares  . Moxifloxacin Palpitations    REACTION: nausea/chest pain She can take Cipro  . Simvastatin Other (See Comments)    indigestion     Review of Systems: All systems reviewed and negative except where noted in HPI.   Lab Results  Component Value Date   WBC 6.4 09/02/2015   HGB 11.5 (L) 09/02/2015   HCT 37.1 09/02/2015   MCV 86.1 09/02/2015   PLT 177 09/02/2015    Lab Results  Component Value Date   CREATININE 0.88 09/02/2015   BUN 19 09/02/2015   NA 139 09/02/2015   K 3.9 09/02/2015   CL 104 09/02/2015   CO2 26 09/02/2015    Lab Results  Component Value Date   ALT 16 05/09/2015   AST 20 05/09/2015   ALKPHOS 70 05/09/2015   BILITOT 0.5 05/09/2015     Physical Exam: BP 134/78   Pulse 72   Ht 4' 10.5" (1.486 m)   Wt 166 lb 8 oz (75.5 kg)   LMP 04/08/1996   BMI 34.21 kg/m  Constitutional: Pleasant,well-developed, female in no acute distress. HEENT: Normocephalic and atraumatic. Conjunctivae are normal. No scleral icterus. Neck supple.  Cardiovascular: Normal rate, regular rhythm.  Pulmonary/chest: Effort normal and breath sounds normal. No wheezing, rales or rhonchi. Abdominal: Soft, nondistended, nontender. There are no masses palpable. Extremities: no edema Lymphadenopathy: No cervical adenopathy noted. Neurological: Alert and oriented to person place and time. Skin: Skin is warm and dry. No rashes noted. Psychiatric: Normal mood and affect. Behavior is normal.   ASSESSMENT AND PLAN: 82 year old female with a history of IBS, and long-standing urinary incontinence, presenting with acute intermittent episodes of fecal incontinence as outlined above. In between episodes she has no symptoms of fecal leakage or diarrhea.  Episodes appear to be preceded by eating different foods out of the house or her normal routine, and suspect this is related. I counseled her on a low FODMAP  diet, and recommend she try to avoid high-risk foods. She should try taking a daily fiber supplement if possible, and recommend if she is in a restaurant or traveling she can try taking Imodium an hour before eating to help prevent episodes. Otherwise, while I don't think pelvic floor dysfunction is causing her incontinence, which is more likely due to increased urge, does have ongoing urinary incontinence for which she had benefit from pelvic floor physical therapy in the past. In this light I'll refer her back to pelvic floor physical therapy. I otherwise think the utility of colonoscopy to rule out microscopic colitis and other pathology is likely low given the intermittent and  rate occurence of her symptoms. If symptoms persist despite these measures she should contact me for reassessment  [place] Cellar, MD [facility] Gastroenter[facility] Pager [phone]

## 2016-03-13 DIAGNOSIS — J22 Unspecified acute lower respiratory infection: Secondary | ICD-10-CM | POA: Diagnosis not present

## 2016-03-13 NOTE — Telephone Encounter (Signed)
Error encounter. 

## 2016-03-21 ENCOUNTER — Telehealth: Payer: Self-pay | Admitting: Internal Medicine

## 2016-03-21 NOTE — Telephone Encounter (Signed)
Spoke with pt. She states that she is having issues with cough and congestion. Was seen at an urgent care 2 weeks ago. Wants appointment with our office to be evaluated. The only appointment we have available is on Monday, 03/25/16. Pt has taken this appointment but I advised her that if her symptoms gets worse she will need to seek medical care. She verbalized understanding. Nothing further was needed.

## 2016-03-22 DIAGNOSIS — R0602 Shortness of breath: Secondary | ICD-10-CM | POA: Diagnosis not present

## 2016-03-22 DIAGNOSIS — J22 Unspecified acute lower respiratory infection: Secondary | ICD-10-CM | POA: Diagnosis not present

## 2016-03-22 DIAGNOSIS — R05 Cough: Secondary | ICD-10-CM | POA: Diagnosis not present

## 2016-03-25 ENCOUNTER — Ambulatory Visit (INDEPENDENT_AMBULATORY_CARE_PROVIDER_SITE_OTHER): Payer: Medicare Other | Admitting: Internal Medicine

## 2016-03-25 ENCOUNTER — Encounter: Payer: Self-pay | Admitting: Internal Medicine

## 2016-03-25 VITALS — BP 124/86 | HR 73 | Temp 97.6°F | Ht 59.0 in | Wt 162.4 lb

## 2016-03-25 DIAGNOSIS — R058 Other specified cough: Secondary | ICD-10-CM

## 2016-03-25 DIAGNOSIS — R05 Cough: Secondary | ICD-10-CM

## 2016-03-25 MED ORDER — PREDNISONE 10 MG PO TABS: ORAL_TABLET | ORAL | 0 refills | Status: DC

## 2016-03-25 NOTE — Patient Instructions (Addendum)
Try prilosec otc 20mg   Take 30-60 min before first meal of the day and Pepcid ac (famotidine) 20 mg one @  bedtime until cough is completely gone for at least a week without the need for cough suppression  GERD (REFLUX)  is an extremely common cause of respiratory symptoms just like yours , many times with no obvious heartburn at all.    It can be treated with medication, but also with lifestyle changes including elevation of the head of your bed (ideally with 6 inch  bed blocks),  Smoking cessation, avoidance of late meals, excessive alcohol, and avoid fatty foods, chocolate, peppermint, colas, red wine, and acidic juices such as orange juice.  NO MINT OR MENTHOL PRODUCTS SO NO COUGH DROPS  USE SUGARLESS CANDY INSTEAD (Jolley ranchers or Stover's or Life Savers) or even ice chips will also do - the key is to swallow to prevent all throat clearing. NO OIL BASED VITAMINS - use powdered substitutes.    For cough > mucinex dm up to 1200 mg every 12 hours and the flutter valve every time you cough   If not better > Prednisone 10 mg take  4 each am x 2 days,   2 each am x 2 days,  1 each am x 2 days and stop   If not all better at 2 weeks return to clinic

## 2016-03-25 NOTE — Progress Notes (Signed)
Subjective:    Patient ID: [name], female    DOB: 12/25/1930,    MRN: [id number]    Brief patient profile:  64 yowf never smoker, never resp problems until admit with Pna for around 2011 and sporadic cough since then maybe once yearly with recurrent cough since late Aug 2016 so self referred to pulmonary clinic 01/02/2015    History of Present Illness  Prev eval 05/2011 for MPN's rec I recommend repeat chest CT in 04/2012 (placed in tickle file for recall) unless you developed unexplained weight loss, pain with breathing, persistent cough or more short of breath than you are. > repeated 05/08/12 and no change, considered benign by radiology   01/02/2015   Pulmonary office visit in / [place]   Chief Complaint  Patient presents with  . Advice Only    Last seen 05/2011. C/o waking up in mornings w/ deep, dry cough in Aug. No PND, no nasal cong. Pt has been on ABX's x2.   insidious onset late Aug 2016 s cold/sinus but typically happens in winter and dx as bronchitis and typically better p rx with abx Seen 12/08/14  rx levaquin/ prednisone  - total of 10 days of levaquin  Better p cough syrup  rec Whenever you feel the urge to cough, cough into the flutter valve Pantoprazole (protonix) (prilosec otc is ok, too) 40 mg   Take  30-60 min before first meal of the day and Pepcid (famotidine)  20 mg one @  bedtime until return to office - this is the best way to tell whether stomach acid is contributing to your problem.   GERD diet   02/03/2015  f/u ov/Jadarrius Maselli re: cough s/p ? CAP  Chief Complaint  Patient presents with  . Follow-up    Pt states that cough has resolved itself. No new complaints voiced at visit    Not limited by breathing from desired activities  Including yard work rec Please remember to go to the  x-ray department downstairs for your tests - we will call you with the results when they are available. Ok to wean off you acid suppression after Dec 1 - at the first sign of any  cough, scratchy throat, throat tickle start back right away on full suppression and cough into the flutter valve to prevent throat trauma from coughing  Try to get about 15 lb off if possible    03/25/2016  f/u ov/Nashalie Sallis re: recurrent "yearly cough "  Chief Complaint  Patient presents with  . Acute Visit    Pt c/o cough since end of November 2017.  She states she has been to UC x 2 and txed with 2 different abx with little improvement. Her cough is prod with green sputum. She also c/o feeling fatigued.    acute onset cough  was with ? Samuel [place] but did not start back on acid suppression rx or use flutter as rec  rx pred shot/ augmentin/zpak finishing in next 2 days > freq/ severity no better but less dark mucus  Cough in general worse with activity/ better sleeping and no excess am sputum       No obvious day to day or daytime variability or assoc sob   or cp or chest tightness, subjective wheeze or overt sinus or hb symptoms. No unusual exp hx or h/o childhood pna/ asthma or knowledge of premature birth.  Sleeping ok without nocturnal  or early am exacerbation  of respiratory  c/o's or need for  noct saba. Also denies any obvious fluctuation of symptoms with weather or environmental changes or other aggravating or alleviating factors except as outlined above   Current Medications, Allergies, Complete Past Medical History, Past Surgical History, Family History, and Social History were reviewed in [employer] record.  ROS  The following are not active complaints unless bolded sore throat, dysphagia, dental problems, itching, sneezing,  nasal congestion or excess/ purulent secretions, ear ache,   fever, chills, sweats, unintended wt loss, classically pleuritic or exertional cp, hemoptysis,  orthopnea pnd or leg swelling, presyncope, palpitations, abdominal pain, anorexia, nausea, vomiting, diarrhea  or change in bowel or bladder habits, change in stools or urine, dysuria,hematuria,   rash, arthralgias, visual complaints, headache, numbness, weakness or ataxia or problems with walking or coordination,  change in mood/affect or memory.          Objective:   Physical Exam  amb obese wf  nad with harsh honking / grinding cough   03/25/2016      162  02/03/2015     167     01/02/15 167 lb 12.8 oz (76.114 kg)  12/08/14 169 lb (76.658 kg)  11/08/14 171 lb (77.565 kg)    Vital signs reviewed - Note on arrival 02 sats  97% on RA      HEENT: nl dentition, turbinates, and orophanx. Nl external ear canals without cough reflex   NECK :  without JVD/Nodes/TM/ nl carotid upstrokes bilaterally   LUNGS: no acc muscle use, clear to A and P bilaterally with minimal wheeze with fvc/ better with plm    CV:  RRR  no s3 or murmur or increase in P2, no edema   ABD:  soft and nontender with nl excursion in the supine position. No bruits or organomegaly, bowel sounds nl  MS:  warm without deformities, calf tenderness, cyanosis or clubbing  SKIN: warm and dry without lesions    NEURO:  alert, approp, no deficits       cxr at [facility] ok per pt w/in the last few weeks       Assessment & Plan:

## 2016-03-26 ENCOUNTER — Encounter: Payer: Self-pay | Admitting: Internal Medicine

## 2016-03-26 NOTE — Assessment & Plan Note (Signed)
Flutter valve added 01/02/2015 > resolved 02/03/2015  - rec try off gerd rx p 03/09/15   Of the three most common causes of chronic cough, only one (GERD)  can actually cause the other two (asthma and post nasal drip syndrome)  and perpetuate the cylce of cough inducing airway trauma, inflammation, heightened sensitivity to reflux which is prompted by the cough itself via a cyclical mechanism.    This may partially respond to steroids and look like asthma and post nasal drainage but never erradicated completely unless the cough and the secondary reflux are eliminated, preferably both at the same time.  While not intuitively obvious, many patients with chronic low grade reflux do not cough until there is a secondary insult that disturbs the protective epithelial barrier and exposes sensitive nerve endings.  This can be viral (as was probably the case here)  or direct physical injury such as with an endotracheal tube.   The point is that once this occurs, it is difficult to eliminate using anything but a maximally effective acid suppression regimen at least in the short run, accompanied by an appropriate diet to address non acid GERD.   rec max gerd rx/ flutter valve to prevent cyclical cough/ and another very short course of prednisone, returning in 2 weeks for further w/u if not resolved and to call in meantime if worsens  I had an extended discussion with the patient reviewing all relevant studies completed to date and  lasting 15 to 20 minutes of a 25 minute visit    Each maintenance medication was reviewed in detail including most importantly the difference between maintenance and prns and under what circumstances the prns are to be triggered using an action plan format that is not reflected in the computer generated alphabetically organized AVS.    Please see AVS for unique instructions that I personally wrote and verbalized to the the pt in detail and then reviewed with pt  by my nurse highlighting  any  changes in therapy recommended at today's visit to their plan of care.

## 2016-04-10 ENCOUNTER — Telehealth: Payer: Self-pay | Admitting: Internal Medicine

## 2016-04-10 DIAGNOSIS — R3915 Urgency of urination: Secondary | ICD-10-CM | POA: Diagnosis not present

## 2016-04-10 DIAGNOSIS — M6281 Muscle weakness (generalized): Secondary | ICD-10-CM | POA: Diagnosis not present

## 2016-04-10 DIAGNOSIS — N3946 Mixed incontinence: Secondary | ICD-10-CM | POA: Diagnosis not present

## 2016-04-10 DIAGNOSIS — R152 Fecal urgency: Secondary | ICD-10-CM | POA: Diagnosis not present

## 2016-04-10 NOTE — Telephone Encounter (Signed)
Called patient to schedule awv. Phone was busy. Will attempt to call patient back at a later time.

## 2016-04-24 ENCOUNTER — Ambulatory Visit: Payer: Medicare Other | Admitting: Internal Medicine

## 2016-05-01 DIAGNOSIS — N3941 Urge incontinence: Secondary | ICD-10-CM | POA: Diagnosis not present

## 2016-05-01 DIAGNOSIS — M62838 Other muscle spasm: Secondary | ICD-10-CM | POA: Diagnosis not present

## 2016-05-01 DIAGNOSIS — M6281 Muscle weakness (generalized): Secondary | ICD-10-CM | POA: Diagnosis not present

## 2016-05-01 DIAGNOSIS — R3915 Urgency of urination: Secondary | ICD-10-CM | POA: Diagnosis not present

## 2016-05-01 DIAGNOSIS — R152 Fecal urgency: Secondary | ICD-10-CM | POA: Diagnosis not present

## 2016-05-27 DIAGNOSIS — N3941 Urge incontinence: Secondary | ICD-10-CM | POA: Diagnosis not present

## 2016-05-27 DIAGNOSIS — N3946 Mixed incontinence: Secondary | ICD-10-CM | POA: Diagnosis not present

## 2016-05-27 DIAGNOSIS — R3915 Urgency of urination: Secondary | ICD-10-CM | POA: Diagnosis not present

## 2016-05-27 DIAGNOSIS — M62838 Other muscle spasm: Secondary | ICD-10-CM | POA: Diagnosis not present

## 2016-05-27 DIAGNOSIS — R152 Fecal urgency: Secondary | ICD-10-CM | POA: Diagnosis not present

## 2016-06-10 ENCOUNTER — Telehealth: Payer: Self-pay | Admitting: Internal Medicine

## 2016-06-10 NOTE — Telephone Encounter (Signed)
lmomtcb x1 

## 2016-06-11 NOTE — Telephone Encounter (Signed)
Ok x one refill but note instructions say to return in 2 weeks if not all better and was only supposed to continue the gerd rx for one week once better which doesn't sound like what was supposed to happen so needs to refurn with all meds in hand  By 4 weeks (before meds need to be refilled) so we can regroup

## 2016-06-11 NOTE — Telephone Encounter (Signed)
lmtcb x1 for pt. 

## 2016-06-11 NOTE — Telephone Encounter (Signed)
Spoke with pt, who is requesting Rx for famotidine 20mg  & pantoprazole 40mg  for lingering non prod cough. Cough occurs mainly in the morning x24mo.   MW please advise on Rx. Thanks.   03/25/16 Patient Instructions    Try prilosec otc 20mg   Take 30-60 min before first meal of the day and Pepcid ac (famotidine) 20 mg one @  bedtime until cough is completely gone for at least a week without the need for cough suppression  GERD (REFLUX)  is an extremely common cause of respiratory symptoms just like yours , many times with no obvious heartburn at all.    It can be treated with medication, but also with lifestyle changes including elevation of the head of your bed (ideally with 6 inch  bed blocks),  Smoking cessation, avoidance of late meals, excessive alcohol, and avoid fatty foods, chocolate, peppermint, colas, red wine, and acidic juices such as orange juice.  NO MINT OR MENTHOL PRODUCTS SO NO COUGH DROPS  USE SUGARLESS CANDY INSTEAD (Jolley ranchers or Stover's or Life Savers) or even ice chips will also do - the key is to swallow to prevent all throat clearing. NO OIL BASED VITAMINS - use powdered substitutes.    For cough > mucinex dm up to 1200 mg every 12 hours and the flutter valve every time you cough   If not better > Prednisone 10 mg take  4 each am x 2 days,   2 each am x 2 days,  1 each am x 2 days and stop   If not all better at 2 weeks return to clinic

## 2016-06-11 NOTE — Telephone Encounter (Signed)
Pt returning call.Diana Stout ° °

## 2016-06-12 DIAGNOSIS — M62838 Other muscle spasm: Secondary | ICD-10-CM | POA: Diagnosis not present

## 2016-06-12 DIAGNOSIS — R152 Fecal urgency: Secondary | ICD-10-CM | POA: Diagnosis not present

## 2016-06-12 DIAGNOSIS — M6281 Muscle weakness (generalized): Secondary | ICD-10-CM | POA: Diagnosis not present

## 2016-06-12 DIAGNOSIS — N3946 Mixed incontinence: Secondary | ICD-10-CM | POA: Diagnosis not present

## 2016-06-13 MED ORDER — FAMOTIDINE 20 MG PO TABS: 20.0000 mg | ORAL_TABLET | Freq: Two times a day (BID) | ORAL | 0 refills | Status: DC

## 2016-06-13 MED ORDER — PANTOPRAZOLE SODIUM 40 MG PO TBEC: 40.0000 mg | DELAYED_RELEASE_TABLET | Freq: Every day | ORAL | 0 refills | Status: DC

## 2016-06-13 NOTE — Telephone Encounter (Signed)
Called and spoke with pt and she is aware of refills of the famotadine and the pantoprazole.  Pt is aware that she will need to schedule an appt with MW before these meds run out. Pt stated that she will call to schedule this appt.

## 2016-06-25 ENCOUNTER — Other Ambulatory Visit: Payer: Self-pay | Admitting: Internal Medicine

## 2016-06-28 ENCOUNTER — Telehealth: Payer: Self-pay | Admitting: Internal Medicine

## 2016-06-28 DIAGNOSIS — E785 Hyperlipidemia, unspecified: Secondary | ICD-10-CM

## 2016-06-28 DIAGNOSIS — I159 Secondary hypertension, unspecified: Secondary | ICD-10-CM

## 2016-06-28 DIAGNOSIS — Z0000 Encounter for general adult medical examination without abnormal findings: Secondary | ICD-10-CM

## 2016-06-28 NOTE — Telephone Encounter (Signed)
Pt called to schedule an appointment for a follow up with Dr Alain Marion and wanted to know if she could have labs done before her appointment so that he can review them with her while she is here. Can we put orders in for this and call her when they are ready? Please advise.

## 2016-07-01 NOTE — Telephone Encounter (Signed)
OK CBC, CMET, TSH, lipids, UA Thx

## 2016-07-01 NOTE — Telephone Encounter (Signed)
What should I associate labs with----her last visit is not really clear with why you are ordering these labs---routing to dr plotnikov, please advise,  thanks

## 2016-07-01 NOTE — Telephone Encounter (Signed)
Routing to dr plotnikov, please advise, thanks 

## 2016-07-02 NOTE — Telephone Encounter (Signed)
HTN, dyslipidemia, well exam Thx

## 2016-07-02 NOTE — Telephone Encounter (Signed)
Advised patient lab orders are in

## 2016-07-12 ENCOUNTER — Ambulatory Visit: Payer: Medicare Other | Admitting: Internal Medicine

## 2016-07-15 ENCOUNTER — Other Ambulatory Visit (INDEPENDENT_AMBULATORY_CARE_PROVIDER_SITE_OTHER): Payer: Medicare Other

## 2016-07-15 DIAGNOSIS — E785 Hyperlipidemia, unspecified: Secondary | ICD-10-CM

## 2016-07-15 DIAGNOSIS — Z0000 Encounter for general adult medical examination without abnormal findings: Secondary | ICD-10-CM

## 2016-07-15 DIAGNOSIS — I159 Secondary hypertension, unspecified: Secondary | ICD-10-CM | POA: Diagnosis not present

## 2016-07-15 LAB — URINALYSIS, ROUTINE W REFLEX MICROSCOPIC
Bilirubin Urine: NEGATIVE
Hgb urine dipstick: NEGATIVE
KETONES UR: NEGATIVE
LEUKOCYTES UA: NEGATIVE
NITRITE: NEGATIVE
RBC / HPF: NONE SEEN (ref 0–?)
Specific Gravity, Urine: 1.02 (ref 1.000–1.030)
TOTAL PROTEIN, URINE-UPE24: NEGATIVE
UROBILINOGEN UA: 0.2 (ref 0.0–1.0)
Urine Glucose: NEGATIVE
WBC, UA: NONE SEEN (ref 0–?)
pH: 5.5 (ref 5.0–8.0)

## 2016-07-15 LAB — COMPREHENSIVE METABOLIC PANEL
ALT: 14 U/L (ref 0–35)
AST: 18 U/L (ref 0–37)
Albumin: 3.9 g/dL (ref 3.5–5.2)
Alkaline Phosphatase: 84 U/L (ref 39–117)
BUN: 14 mg/dL (ref 6–23)
CHLORIDE: 106 meq/L (ref 96–112)
CO2: 29 meq/L (ref 19–32)
Calcium: 9.2 mg/dL (ref 8.4–10.5)
Creatinine, Ser: 0.73 mg/dL (ref 0.40–1.20)
GFR: 80.85 mL/min (ref >60.00)
GLUCOSE: 105 mg/dL — AB (ref 70–99)
POTASSIUM: 4 meq/L (ref 3.5–5.1)
SODIUM: 141 meq/L (ref 135–145)
Total Bilirubin: 0.5 mg/dL (ref 0.2–1.2)
Total Protein: 7 g/dL (ref 6.0–8.3)

## 2016-07-15 LAB — LIPID PANEL
CHOL/HDL RATIO: 5
Cholesterol: 245 mg/dL — ABNORMAL HIGH (ref 0–200)
HDL: 47.8 mg/dL (ref >39.00)
LDL CALC: 173 mg/dL — AB (ref 0–99)
NONHDL: 197.34
Triglycerides: 121 mg/dL (ref 0.0–149.0)
VLDL: 24.2 mg/dL (ref 0.0–40.0)

## 2016-07-15 LAB — CBC
HEMATOCRIT: 37.3 % (ref 36.0–46.0)
Hemoglobin: 12.3 g/dL (ref 12.0–15.0)
MCHC: 32.8 g/dL (ref 30.0–36.0)
MCV: 85.5 fl (ref 78.0–100.0)
Platelets: 184 10*3/uL (ref 150.0–400.0)
RBC: 4.37 Mil/uL (ref 3.87–5.11)
RDW: 14.6 % (ref 11.5–15.5)
WBC: 4.4 10*3/uL (ref 4.0–10.5)

## 2016-07-15 LAB — TSH: TSH: 0.67 u[IU]/mL (ref 0.35–4.50)

## 2016-07-22 ENCOUNTER — Ambulatory Visit (INDEPENDENT_AMBULATORY_CARE_PROVIDER_SITE_OTHER): Payer: Medicare Other | Admitting: Internal Medicine

## 2016-07-22 ENCOUNTER — Encounter: Payer: Self-pay | Admitting: Internal Medicine

## 2016-07-22 VITALS — BP 122/76 | HR 79 | Temp 97.4°F | Ht 59.0 in | Wt 171.1 lb

## 2016-07-22 DIAGNOSIS — R058 Other specified cough: Secondary | ICD-10-CM

## 2016-07-22 DIAGNOSIS — N281 Cyst of kidney, acquired: Secondary | ICD-10-CM | POA: Insufficient documentation

## 2016-07-22 DIAGNOSIS — I10 Essential (primary) hypertension: Secondary | ICD-10-CM

## 2016-07-22 DIAGNOSIS — K219 Gastro-esophageal reflux disease without esophagitis: Secondary | ICD-10-CM | POA: Diagnosis not present

## 2016-07-22 DIAGNOSIS — R05 Cough: Secondary | ICD-10-CM | POA: Diagnosis not present

## 2016-07-22 DIAGNOSIS — Z23 Encounter for immunization: Secondary | ICD-10-CM

## 2016-07-22 HISTORY — DX: Cyst of kidney, acquired: N28.1

## 2016-07-22 MED ORDER — POTASSIUM CHLORIDE ER 10 MEQ PO TBCR: 10.0000 meq | EXTENDED_RELEASE_TABLET | Freq: Every day | ORAL | 3 refills | Status: DC | PRN

## 2016-07-22 MED ORDER — FLUTICASONE PROPIONATE 50 MCG/ACT NA SUSP: 2.0000 | Freq: Every day | NASAL | 5 refills | Status: DC

## 2016-07-22 MED ORDER — OLMESARTAN MEDOXOMIL 20 MG PO TABS: 10.0000 mg | ORAL_TABLET | Freq: Every day | ORAL | 3 refills | Status: DC

## 2016-07-22 MED ORDER — FLUTICASONE-SALMETEROL 100-50 MCG/DOSE IN AEPB: 1.0000 | INHALATION_SPRAY | Freq: Two times a day (BID) | RESPIRATORY_TRACT | 3 refills | Status: DC

## 2016-07-22 NOTE — Progress Notes (Signed)
Subjective:  Patient ID: Diana Stout, female    DOB: [birth year]  Age: 83 y.o. MRN: [id number]  CC: No chief complaint on file.   HPI JIMYA CIANI presents for a renal cyst, urinary incontinence - not new, HTN, GERD f/u  Outpatient Medications Prior to Visit  Medication Sig Dispense Refill  . aspirin 81 MG tablet Take 81 mg by mouth daily.      . cholecalciferol (VITAMIN D) 1000 UNITS tablet Take 1,000 Units by mouth daily.    . cyanocobalamin 1000 MCG tablet Take 1,000 mcg by mouth daily.    . famotidine (PEPCID) 20 MG tablet Take 1 tablet (20 mg total) by mouth 2 (two) times daily. 60 tablet 0  . fluticasone (FLONASE) 50 MCG/ACT nasal spray Place 2 sprays into both nostrils daily. 16 g 5  . furosemide (LASIX) 20 MG tablet Take 1 tablet (20 mg total) by mouth daily as needed. 30 tablet 3  . olmesartan (BENICAR) 20 MG tablet Take 10 mg by mouth daily.    . pantoprazole (PROTONIX) 40 MG tablet Take 1 tablet (40 mg total) by mouth daily. 30 tablet 0  . potassium chloride (KLOR-CON 10) 10 MEQ tablet Take 1 tablet (10 mEq total) by mouth daily as needed. Take w/Furosemide 30 tablet 3  . predniSONE (DELTASONE) 10 MG tablet Take  4 each am x 2 days,   2 each am x 2 days,  1 each am x 2 days and stop 14 tablet 0  . Respiratory Therapy Supplies (FLUTTER) DEVI Use as directed 1 each 0   No facility-administered medications prior to visit.     ROS Review of Systems  Constitutional: Negative for activity change, appetite change, chills, fatigue and unexpected weight change.  HENT: Negative for congestion, mouth sores and sinus pressure.   Eyes: Negative for visual disturbance.  Respiratory: Negative for cough and chest tightness.   Gastrointestinal: Negative for abdominal pain and nausea.  Genitourinary: Positive for urgency. Negative for difficulty urinating, frequency and vaginal pain.  Musculoskeletal: Positive for arthralgias and gait problem. Negative for back pain.  Skin:  Negative for pallor and rash.  Neurological: Negative for dizziness, tremors, weakness, numbness and headaches.  Psychiatric/Behavioral: Negative for confusion, sleep disturbance and suicidal ideas.    Objective:  BP 122/76 (BP Location: Left Arm, Patient Position: Sitting, Cuff Size: Large)   Pulse 79   Temp 97.4 F (36.3 C) (Oral)   Ht 4\' 11"  (1.499 m)   Wt 171 lb 1.3 oz (77.6 kg)   LMP 04/08/1996   SpO2 96%   BMI 34.55 kg/m   BP Readings from Last 3 Encounters:  07/22/16 122/76  03/25/16 124/86  03/05/16 134/78    Wt Readings from Last 3 Encounters:  07/22/16 171 lb 1.3 oz (77.6 kg)  03/25/16 162 lb 6.4 oz (73.7 kg)  03/05/16 166 lb 8 oz (75.5 kg)    Physical Exam  Constitutional: She appears well-developed. No distress.  HENT:  Head: Normocephalic.  Right Ear: External ear normal.  Left Ear: External ear normal.  Nose: Nose normal.  Mouth/Throat: Oropharynx is clear and moist.  Eyes: Conjunctivae are normal. Pupils are equal, round, and reactive to light. Right eye exhibits no discharge. Left eye exhibits no discharge.  Neck: Normal range of motion. Neck supple. No JVD present. No tracheal deviation present. No thyromegaly present.  Cardiovascular: Normal rate, regular rhythm and normal heart sounds.   Pulmonary/Chest: No stridor. No respiratory distress. She has no wheezes.  Abdominal: Soft. Bowel sounds are normal. She exhibits no distension and no mass. There is no tenderness. There is no rebound and no guarding.  Musculoskeletal: She exhibits no edema or tenderness.  Lymphadenopathy:    She has no cervical adenopathy.  Neurological: She displays normal reflexes. No cranial nerve deficit. She exhibits normal muscle tone. Coordination normal.  Skin: No rash noted. No erythema.  Psychiatric: She has a normal mood and affect. Her behavior is normal. Judgment and thought content normal.    Lab Results  Component Value Date   WBC 4.4 07/15/2016   HGB 12.3  07/15/2016   HCT 37.3 07/15/2016   PLT 184.0 07/15/2016   GLUCOSE 105 (H) 07/15/2016   CHOL 245 (H) 07/15/2016   TRIG 121.0 07/15/2016   HDL 47.80 07/15/2016   LDLDIRECT 154.9 05/01/2011   LDLCALC 173 (H) 07/15/2016   ALT 14 07/15/2016   AST 18 07/15/2016   NA 141 07/15/2016   K 4.0 07/15/2016   CL 106 07/15/2016   CREATININE 0.73 07/15/2016   BUN 14 07/15/2016   CO2 29 07/15/2016   TSH 0.67 07/15/2016   HGBA1C 5.9 05/09/2015    Dg Chest 2 View  Result Date: 09/02/2015 CLINICAL DATA:  Tachycardia. Facial and neck redness and increased warmth. EXAM: CHEST  2 VIEW COMPARISON:  02/03/2015. FINDINGS: Normal sized heart. Clear lungs. Normal vascularity. Thoracic spine degenerative changes, including changes of DISH. Diffuse osteopenia. IMPRESSION: No acute abnormality. Electronically Signed   By: Claudie Revering M.D.   On: 09/02/2015 16:21    Assessment & Plan:   There are no diagnoses linked to this encounter. I am having Ms. Plass maintain her aspirin, cholecalciferol, FLUTTER, cyanocobalamin, furosemide, potassium chloride, fluticasone, olmesartan, predniSONE, pantoprazole, and famotidine.  No orders of the defined types were placed in this encounter.    Follow-up: No Follow-up on file.  Walker Kehr, MD

## 2016-07-22 NOTE — Assessment & Plan Note (Signed)
Advair Treat GERD

## 2016-07-22 NOTE — Progress Notes (Signed)
Pre visit review using our clinic review tool, if applicable. No additional management support is needed unless otherwise documented below in the visit note. 

## 2016-07-22 NOTE — Assessment & Plan Note (Signed)
[place] ordered Urol ref offered

## 2016-07-22 NOTE — Assessment & Plan Note (Signed)
Wedge pillow Protonix, Pepcid

## 2016-07-22 NOTE — Addendum Note (Signed)
Addended by: Karren Cobble on: 07/22/2016 01:36 PM   Modules accepted: Orders

## 2016-07-22 NOTE — Assessment & Plan Note (Signed)
On Benicar 

## 2016-07-26 ENCOUNTER — Telehealth: Payer: Self-pay

## 2016-07-26 DIAGNOSIS — N281 Cyst of kidney, acquired: Secondary | ICD-10-CM

## 2016-07-26 NOTE — Telephone Encounter (Signed)
FYI  [facility] called and stated the order for the renal cyst needed to be changed from a US Abdomen limited to a US renal. I changed the order so they could schedule the patient.

## 2016-07-27 NOTE — Telephone Encounter (Signed)
Thx

## 2016-08-01 ENCOUNTER — Ambulatory Visit
Admission: RE | Admit: 2016-08-01 | Discharge: 2016-08-01 | Disposition: A | Discharge status: Home / Self Care | Payer: Self-pay | Source: Ambulatory Visit | Attending: Internal Medicine | Admitting: Internal Medicine

## 2016-08-01 ENCOUNTER — Other Ambulatory Visit: Payer: Self-pay | Admitting: Internal Medicine

## 2016-08-01 ENCOUNTER — Ambulatory Visit
Admission: RE | Admit: 2016-08-01 | Discharge: 2016-08-01 | Disposition: A | Discharge status: Home / Self Care | Payer: Medicare Other | Source: Ambulatory Visit | Attending: Internal Medicine | Admitting: Internal Medicine

## 2016-08-01 DIAGNOSIS — N281 Cyst of kidney, acquired: Secondary | ICD-10-CM

## 2016-08-07 ENCOUNTER — Telehealth: Payer: Self-pay | Admitting: Internal Medicine

## 2016-08-07 NOTE — Telephone Encounter (Signed)
LM notifying pt of results

## 2016-08-07 NOTE — Telephone Encounter (Signed)
Pt called wanting results from ultrasound on 4/26

## 2016-08-28 ENCOUNTER — Other Ambulatory Visit: Payer: Self-pay | Admitting: *Deleted

## 2016-08-28 MED ORDER — OLMESARTAN MEDOXOMIL 20 MG PO TABS: 20.0000 mg | ORAL_TABLET | Freq: Every day | ORAL | 3 refills | Status: DC

## 2016-08-30 DIAGNOSIS — S60212A Contusion of left wrist, initial encounter: Secondary | ICD-10-CM | POA: Diagnosis not present

## 2016-11-20 ENCOUNTER — Other Ambulatory Visit (INDEPENDENT_AMBULATORY_CARE_PROVIDER_SITE_OTHER): Payer: Medicare Other

## 2016-11-20 ENCOUNTER — Encounter: Payer: Self-pay | Admitting: Internal Medicine

## 2016-11-20 ENCOUNTER — Ambulatory Visit (INDEPENDENT_AMBULATORY_CARE_PROVIDER_SITE_OTHER): Payer: Medicare Other | Admitting: Internal Medicine

## 2016-11-20 DIAGNOSIS — E6609 Other obesity due to excess calories: Secondary | ICD-10-CM | POA: Diagnosis not present

## 2016-11-20 DIAGNOSIS — R7302 Impaired glucose tolerance (oral): Secondary | ICD-10-CM | POA: Diagnosis not present

## 2016-11-20 DIAGNOSIS — I10 Essential (primary) hypertension: Secondary | ICD-10-CM

## 2016-11-20 DIAGNOSIS — M810 Age-related osteoporosis without current pathological fracture: Secondary | ICD-10-CM

## 2016-11-20 DIAGNOSIS — K219 Gastro-esophageal reflux disease without esophagitis: Secondary | ICD-10-CM

## 2016-11-20 DIAGNOSIS — Z6834 Body mass index (BMI) 34.0-34.9, adult: Secondary | ICD-10-CM

## 2016-11-20 LAB — BASIC METABOLIC PANEL
BUN: 14 mg/dL (ref 6–23)
CHLORIDE: 105 meq/L (ref 96–112)
CO2: 32 mEq/L (ref 19–32)
Calcium: 9.3 mg/dL (ref 8.4–10.5)
Creatinine, Ser: 0.69 mg/dL (ref 0.40–1.20)
GFR: 86.21 mL/min (ref >60.00)
GLUCOSE: 112 mg/dL — AB (ref 70–99)
POTASSIUM: 4.4 meq/L (ref 3.5–5.1)
SODIUM: 142 meq/L (ref 135–145)

## 2016-11-20 LAB — HEMOGLOBIN A1C: Hgb A1c MFr Bld: 5.9 % (ref 4.6–6.5)

## 2016-11-20 MED ORDER — ZOSTER VAC RECOMB ADJUVANTED 50 MCG/0.5ML IM SUSR: 0.5000 mL | Freq: Once | INTRAMUSCULAR | 1 refills | Status: AC

## 2016-11-20 MED ORDER — OLMESARTAN MEDOXOMIL 20 MG PO TABS: 20.0000 mg | ORAL_TABLET | Freq: Every day | ORAL | 3 refills | Status: DC

## 2016-11-20 MED ORDER — FLUTICASONE-SALMETEROL 100-50 MCG/DOSE IN AEPB: 1.0000 | INHALATION_SPRAY | Freq: Two times a day (BID) | RESPIRATORY_TRACT | 3 refills | Status: DC

## 2016-11-20 MED ORDER — FUROSEMIDE 20 MG PO TABS: 20.0000 mg | ORAL_TABLET | Freq: Every day | ORAL | 3 refills | Status: DC | PRN

## 2016-11-20 NOTE — Assessment & Plan Note (Signed)
Protonix prn, Pepcid - d/c

## 2016-11-20 NOTE — Assessment & Plan Note (Signed)
Vit D, yoga

## 2016-11-20 NOTE — Assessment & Plan Note (Signed)
Benicar 

## 2016-11-20 NOTE — Assessment & Plan Note (Signed)
labs

## 2016-11-20 NOTE — Progress Notes (Signed)
Subjective:  Patient ID: Diana Stout, female    DOB: 01/17/1931  Age: 83 y.o. MRN: [id number]  CC: No chief complaint on file.   HPI Diana Stout presents for HTN, B12  Def, GERD f/u  Outpatient Medications Prior to Visit  Medication Sig Dispense Refill  . aspirin 81 MG tablet Take 81 mg by mouth daily.      . cholecalciferol (VITAMIN D) 1000 UNITS tablet Take 1,000 Units by mouth daily.    . cyanocobalamin 1000 MCG tablet Take 1,000 mcg by mouth daily.    . famotidine (PEPCID) 20 MG tablet Take 1 tablet (20 mg total) by mouth 2 (two) times daily. 60 tablet 0  . fluticasone (FLONASE) 50 MCG/ACT nasal spray Place 2 sprays into both nostrils daily. 16 g 5  . Fluticasone-Salmeterol (ADVAIR DISKUS) 100-50 MCG/DOSE AEPB Inhale 1 puff into the lungs 2 (two) times daily. 1 each 3  . furosemide (LASIX) 20 MG tablet Take 1 tablet (20 mg total) by mouth daily as needed. 30 tablet 3  . olmesartan (BENICAR) 20 MG tablet Take 1 tablet (20 mg total) by mouth daily. 30 tablet 3  . pantoprazole (PROTONIX) 40 MG tablet Take 1 tablet (40 mg total) by mouth daily. 30 tablet 0  . potassium chloride (KLOR-CON 10) 10 MEQ tablet Take 1 tablet (10 mEq total) by mouth daily as needed. Take w/Furosemide 30 tablet 3  . predniSONE (DELTASONE) 10 MG tablet Take  4 each am x 2 days,   2 each am x 2 days,  1 each am x 2 days and stop 14 tablet 0  . Respiratory Therapy Supplies (FLUTTER) DEVI Use as directed 1 each 0   No facility-administered medications prior to visit.     ROS Review of Systems  Constitutional: Negative for activity change, appetite change, chills, fatigue and unexpected weight change.  HENT: Negative for congestion, mouth sores and sinus pressure.   Eyes: Negative for visual disturbance.  Respiratory: Negative for cough and chest tightness.   Gastrointestinal: Negative for abdominal pain and nausea.  Genitourinary: Negative for difficulty urinating, frequency and vaginal pain.    Musculoskeletal: Negative for back pain and gait problem.  Skin: Negative for pallor and rash.  Neurological: Negative for dizziness, tremors, weakness, numbness and headaches.  Psychiatric/Behavioral: Negative for confusion and sleep disturbance.    Objective:  BP 120/78 (BP Location: Left Arm, Patient Position: Sitting, Cuff Size: Large)   Pulse 77   Temp 97.9 F (36.6 C) (Oral)   Ht 4\' 11"  (1.499 m)   Wt 165 lb (74.8 kg)   LMP 04/08/1996   SpO2 96%   BMI 33.33 kg/m   BP Readings from Last 3 Encounters:  11/20/16 120/78  07/22/16 122/76  03/25/16 124/86    Wt Readings from Last 3 Encounters:  11/20/16 165 lb (74.8 kg)  07/22/16 171 lb 1.3 oz (77.6 kg)  03/25/16 162 lb 6.4 oz (73.7 kg)    Physical Exam  Constitutional: She appears well-developed. No distress.  HENT:  Head: Normocephalic.  Right Ear: External ear normal.  Left Ear: External ear normal.  Nose: Nose normal.  Mouth/Throat: Oropharynx is clear and moist.  Eyes: Pupils are equal, round, and reactive to light. Conjunctivae are normal. Right eye exhibits no discharge. Left eye exhibits no discharge.  Neck: Normal range of motion. Neck supple. No JVD present. No tracheal deviation present. No thyromegaly present.  Cardiovascular: Normal rate, regular rhythm and normal heart sounds.   Pulmonary/Chest: No  stridor. No respiratory distress. She has no wheezes.  Abdominal: Soft. Bowel sounds are normal. She exhibits no distension and no mass. There is no tenderness. There is no rebound and no guarding.  Musculoskeletal: She exhibits no edema or tenderness.  Lymphadenopathy:    She has no cervical adenopathy.  Neurological: She displays normal reflexes. No cranial nerve deficit. She exhibits normal muscle tone. Coordination normal.  Skin: No rash noted. No erythema.  Psychiatric: She has a normal mood and affect. Her behavior is normal. Judgment and thought content normal.  obese  Lab Results  Component Value  Date   WBC 4.4 07/15/2016   HGB 12.3 07/15/2016   HCT 37.3 07/15/2016   PLT 184.0 07/15/2016   GLUCOSE 105 (H) 07/15/2016   CHOL 245 (H) 07/15/2016   TRIG 121.0 07/15/2016   HDL 47.80 07/15/2016   LDLDIRECT 154.9 05/01/2011   LDLCALC 173 (H) 07/15/2016   ALT 14 07/15/2016   AST 18 07/15/2016   NA 141 07/15/2016   K 4.0 07/15/2016   CL 106 07/15/2016   CREATININE 0.73 07/15/2016   BUN 14 07/15/2016   CO2 29 07/15/2016   TSH 0.67 07/15/2016   HGBA1C 5.9 05/09/2015    US Renal  Result Date: 08/01/2016 CLINICAL DATA:  Kidney cyst. EXAM: RENAL / URINARY TRACT ULTRASOUND COMPLETE COMPARISON:  Ultrasound of February 27, 2010. FINDINGS: Right Kidney: Length: 9.9 cm. Echogenicity within normal limits. No mass or hydronephrosis visualized. Left Kidney: Length: 10.5 cm. Stable size and appearance of 3.9 cm cyst with single septation seen arising from lower pole. Echogenicity within normal limits. No mass or hydronephrosis visualized. Bladder: Appears normal for degree of bladder distention. IMPRESSION: Stable 3.9 cm septated cyst in left kidney. No other renal abnormality seen. Electronically Signed   By: Marijo Conception, M.D.   On: 08/01/2016 17:13    Assessment & Plan:   There are no diagnoses linked to this encounter. I am having Diana Stout maintain her aspirin, cholecalciferol, FLUTTER, cyanocobalamin, furosemide, predniSONE, pantoprazole, famotidine, fluticasone, potassium chloride, Fluticasone-Salmeterol, and olmesartan.  No orders of the defined types were placed in this encounter.    Follow-up: No Follow-up on file.  Walker Kehr, MD

## 2016-11-20 NOTE — Assessment & Plan Note (Signed)
Better on diet 

## 2016-12-02 ENCOUNTER — Other Ambulatory Visit (INDEPENDENT_AMBULATORY_CARE_PROVIDER_SITE_OTHER): Payer: Medicare Other

## 2016-12-02 ENCOUNTER — Ambulatory Visit (INDEPENDENT_AMBULATORY_CARE_PROVIDER_SITE_OTHER): Payer: Medicare Other | Admitting: Internal Medicine

## 2016-12-02 ENCOUNTER — Encounter: Payer: Self-pay | Admitting: Internal Medicine

## 2016-12-02 DIAGNOSIS — I10 Essential (primary) hypertension: Secondary | ICD-10-CM | POA: Diagnosis not present

## 2016-12-02 DIAGNOSIS — R197 Diarrhea, unspecified: Secondary | ICD-10-CM | POA: Diagnosis not present

## 2016-12-02 LAB — BASIC METABOLIC PANEL
BUN: 18 mg/dL (ref 6–23)
CALCIUM: 9.5 mg/dL (ref 8.4–10.5)
CO2: 30 mEq/L (ref 19–32)
Chloride: 103 mEq/L (ref 96–112)
Creatinine, Ser: 0.8 mg/dL (ref 0.40–1.20)
GFR: 72.67 mL/min (ref >60.00)
Glucose, Bld: 103 mg/dL — ABNORMAL HIGH (ref 70–99)
Potassium: 4 mEq/L (ref 3.5–5.1)
Sodium: 139 mEq/L (ref 135–145)

## 2016-12-02 NOTE — Progress Notes (Signed)
Subjective:  Patient ID: Diana Stout, female    DOB: 26-Nov-1930  Age: 83 y.o. MRN: [id number]  CC: No chief complaint on file.   HPI   MALEAHA HUGHETT presents for severe diarrhea since Sun - better now. The had a BBQ on Wed Better on Imodium  Last BM - Sunday   Outpatient Medications Prior to Visit  Medication Sig Dispense Refill  . aspirin 81 MG tablet Take 81 mg by mouth daily.      . cholecalciferol (VITAMIN D) 1000 UNITS tablet Take 1,000 Units by mouth daily.    . cyanocobalamin 1000 MCG tablet Take 1,000 mcg by mouth daily.    . famotidine (PEPCID) 20 MG tablet Take 1 tablet (20 mg total) by mouth 2 (two) times daily. 60 tablet 0  . fluticasone (FLONASE) 50 MCG/ACT nasal spray Place 2 sprays into both nostrils daily. 16 g 5  . Fluticasone-Salmeterol (ADVAIR DISKUS) 100-50 MCG/DOSE AEPB Inhale 1 puff into the lungs 2 (two) times daily. 1 each 3  . furosemide (LASIX) 20 MG tablet Take 1 tablet (20 mg total) by mouth daily as needed. 30 tablet 3  . olmesartan (BENICAR) 20 MG tablet Take 1 tablet (20 mg total) by mouth daily. 30 tablet 3  . pantoprazole (PROTONIX) 40 MG tablet Take 1 tablet (40 mg total) by mouth daily. 30 tablet 0  . potassium chloride (KLOR-CON 10) 10 MEQ tablet Take 1 tablet (10 mEq total) by mouth daily as needed. Take w/Furosemide 30 tablet 3  . predniSONE (DELTASONE) 10 MG tablet Take  4 each am x 2 days,   2 each am x 2 days,  1 each am x 2 days and stop 14 tablet 0  . Respiratory Therapy Supplies (FLUTTER) DEVI Use as directed 1 each 0   No facility-administered medications prior to visit.     ROS Review of Systems  Constitutional: Positive for fatigue. Negative for activity change, appetite change, chills and unexpected weight change.  HENT: Negative for congestion, mouth sores and sinus pressure.   Eyes: Negative for visual disturbance.  Respiratory: Negative for cough and chest tightness.   Gastrointestinal: Positive for abdominal pain,  diarrhea and nausea.  Genitourinary: Negative for difficulty urinating, frequency and vaginal pain.  Musculoskeletal: Negative for back pain and gait problem.  Skin: Negative for pallor and rash.  Neurological: Positive for weakness. Negative for dizziness, tremors, numbness and headaches.  Psychiatric/Behavioral: Negative for confusion and sleep disturbance.    Objective:  BP 114/68 (BP Location: Left Arm, Patient Position: Sitting, Cuff Size: Large)   Pulse 95   Temp 98.1 F (36.7 C) (Oral)   Ht 4\' 11"  (1.499 m)   Wt 161 lb (73 kg)   LMP 04/08/1996   SpO2 98%   BMI 32.52 kg/m   BP Readings from Last 3 Encounters:  12/02/16 114/68  11/20/16 120/78  07/22/16 122/76    Wt Readings from Last 3 Encounters:  12/02/16 161 lb (73 kg)  11/20/16 165 lb (74.8 kg)  07/22/16 171 lb 1.3 oz (77.6 kg)    Physical Exam  Constitutional: She appears well-developed. No distress.  HENT:  Head: Normocephalic.  Right Ear: External ear normal.  Left Ear: External ear normal.  Nose: Nose normal.  Mouth/Throat: Oropharynx is clear and moist.  Eyes: Pupils are equal, round, and reactive to light. Conjunctivae are normal. Right eye exhibits no discharge. Left eye exhibits no discharge.  Neck: Normal range of motion. Neck supple. No JVD present. No  tracheal deviation present. No thyromegaly present.  Cardiovascular: Normal rate, regular rhythm and normal heart sounds.   Pulmonary/Chest: No stridor. No respiratory distress. She has no wheezes.  Abdominal: Soft. Bowel sounds are normal. She exhibits no distension and no mass. There is no tenderness. There is no rebound and no guarding.  Musculoskeletal: She exhibits no edema or tenderness.  Lymphadenopathy:    She has no cervical adenopathy.  Neurological: She displays normal reflexes. No cranial nerve deficit. She exhibits normal muscle tone. Coordination normal.  Skin: No rash noted. No erythema.  Psychiatric: She has a normal mood and affect.  Her behavior is normal. Judgment and thought content normal.    Lab Results  Component Value Date   WBC 4.4 07/15/2016   HGB 12.3 07/15/2016   HCT 37.3 07/15/2016   PLT 184.0 07/15/2016   GLUCOSE 112 (H) 11/20/2016   CHOL 245 (H) 07/15/2016   TRIG 121.0 07/15/2016   HDL 47.80 07/15/2016   LDLDIRECT 154.9 05/01/2011   LDLCALC 173 (H) 07/15/2016   ALT 14 07/15/2016   AST 18 07/15/2016   NA 142 11/20/2016   K 4.4 11/20/2016   CL 105 11/20/2016   CREATININE 0.69 11/20/2016   BUN 14 11/20/2016   CO2 32 11/20/2016   TSH 0.67 07/15/2016   HGBA1C 5.9 11/20/2016    US Renal  Result Date: 08/01/2016 CLINICAL DATA:  Kidney cyst. EXAM: RENAL / URINARY TRACT ULTRASOUND COMPLETE COMPARISON:  Ultrasound of February 27, 2010. FINDINGS: Right Kidney: Length: 9.9 cm. Echogenicity within normal limits. No mass or hydronephrosis visualized. Left Kidney: Length: 10.5 cm. Stable size and appearance of 3.9 cm cyst with single septation seen arising from lower pole. Echogenicity within normal limits. No mass or hydronephrosis visualized. Bladder: Appears normal for degree of bladder distention. IMPRESSION: Stable 3.9 cm septated cyst in left kidney. No other renal abnormality seen. Electronically Signed   By: Marijo Conception, M.D.   On: 08/01/2016 17:13    Assessment & Plan:   There are no diagnoses linked to this encounter. I am having Ms. Hershberger maintain her aspirin, cholecalciferol, FLUTTER, cyanocobalamin, predniSONE, pantoprazole, famotidine, fluticasone, potassium chloride, olmesartan, furosemide, and Fluticasone-Salmeterol.  No orders of the defined types were placed in this encounter.    Follow-up: No Follow-up on file.  Walker Kehr, MD

## 2016-12-02 NOTE — Assessment & Plan Note (Signed)
Benicar is on hold

## 2016-12-02 NOTE — Assessment & Plan Note (Signed)
Better Labs Imodium po prn

## 2016-12-07 DIAGNOSIS — Z8701 Personal history of pneumonia (recurrent): Secondary | ICD-10-CM

## 2016-12-07 DIAGNOSIS — J189 Pneumonia, unspecified organism: Secondary | ICD-10-CM

## 2016-12-07 HISTORY — DX: Pneumonia, unspecified organism: J18.9

## 2016-12-07 HISTORY — DX: Personal history of pneumonia (recurrent): Z87.01

## 2016-12-28 ENCOUNTER — Telehealth: Payer: Self-pay

## 2016-12-28 NOTE — Telephone Encounter (Signed)
Received [facility] note about pt reporting a productive cough, runny nose x 2-3 days.... Left message on voicemail as I was going to schedule appt for pt and get more information

## 2017-01-01 ENCOUNTER — Ambulatory Visit (INDEPENDENT_AMBULATORY_CARE_PROVIDER_SITE_OTHER): Payer: Medicare Other | Admitting: Internal Medicine

## 2017-01-01 ENCOUNTER — Encounter: Payer: Self-pay | Admitting: Internal Medicine

## 2017-01-01 ENCOUNTER — Ambulatory Visit (INDEPENDENT_AMBULATORY_CARE_PROVIDER_SITE_OTHER)
Admission: RE | Admit: 2017-01-01 | Discharge: 2017-01-01 | Disposition: A | Discharge status: Home / Self Care | Payer: Medicare Other | Source: Ambulatory Visit | Attending: Internal Medicine | Admitting: Internal Medicine

## 2017-01-01 VITALS — BP 108/68 | HR 93 | Temp 97.8°F | Ht 59.0 in | Wt 164.0 lb

## 2017-01-01 DIAGNOSIS — R05 Cough: Secondary | ICD-10-CM | POA: Diagnosis not present

## 2017-01-01 DIAGNOSIS — J189 Pneumonia, unspecified organism: Secondary | ICD-10-CM | POA: Insufficient documentation

## 2017-01-01 DIAGNOSIS — R058 Other specified cough: Secondary | ICD-10-CM

## 2017-01-01 DIAGNOSIS — K120 Recurrent oral aphthae: Secondary | ICD-10-CM | POA: Diagnosis not present

## 2017-01-01 HISTORY — DX: Recurrent oral aphthae: K12.0

## 2017-01-01 HISTORY — DX: Pneumonia, unspecified organism: J18.9

## 2017-01-01 MED ORDER — TRIAMCINOLONE ACETONIDE 0.1 % MT PSTE: 1.0000 "application " | PASTE | Freq: Three times a day (TID) | OROMUCOSAL | 1 refills | Status: DC

## 2017-01-01 MED ORDER — VALACYCLOVIR HCL 500 MG PO TABS: 500.0000 mg | ORAL_TABLET | Freq: Two times a day (BID) | ORAL | 1 refills | Status: DC

## 2017-01-01 MED ORDER — ALIGN 4 MG PO CAPS: 1.0000 | ORAL_CAPSULE | Freq: Every day | ORAL | 0 refills | Status: AC

## 2017-01-01 MED ORDER — HYDROCOD POLST-CPM POLST ER 10-8 MG/5ML PO SUER: 2.5000 mL | Freq: Two times a day (BID) | ORAL | 0 refills | Status: DC | PRN

## 2017-01-01 NOTE — Assessment & Plan Note (Signed)
CXR Tussionex if tolerated Pulm ref

## 2017-01-01 NOTE — Assessment & Plan Note (Addendum)
Pulm ref Dr Alva 

## 2017-01-01 NOTE — Patient Instructions (Signed)
You can use over-the-counter  "cold" medicines  such as  "Afrin" nasal spray for nasal congestion as directed. Use " Delsym" or" Robitussin" cough syrup varietis for cough.  You can use plain "Tylenol" or "Advil" for fever, chills and achyness. Use Halls or Ricola cough drops.     Please, make an appointment if you are not better or if you're worse.  

## 2017-01-01 NOTE — Assessment & Plan Note (Signed)
Valtrex po Triamc/dental paste

## 2017-01-01 NOTE — Progress Notes (Signed)
Subjective:  Patient ID: Diana Stout, female    DOB: 01/02/1931  Age: 83 y.o. MRN: [id number]  CC: No chief complaint on file.   HPI ARIENNA BENEGAS presents for cough x 1 week - worse. The pt went to [facility] on Sat - Rx for Augmentin starting last Sat: 40% better. C/o slight diarrhea. C/o roof of the mouth feeling raw.  Outpatient Medications Prior to Visit  Medication Sig Dispense Refill  . aspirin 81 MG tablet Take 81 mg by mouth daily.      . cholecalciferol (VITAMIN D) 1000 UNITS tablet Take 1,000 Units by mouth daily.    . cyanocobalamin 1000 MCG tablet Take 1,000 mcg by mouth daily.    . famotidine (PEPCID) 20 MG tablet Take 1 tablet (20 mg total) by mouth 2 (two) times daily. 60 tablet 0  . fluticasone (FLONASE) 50 MCG/ACT nasal spray Place 2 sprays into both nostrils daily. 16 g 5  . Fluticasone-Salmeterol (ADVAIR DISKUS) 100-50 MCG/DOSE AEPB Inhale 1 puff into the lungs 2 (two) times daily. 1 each 3  . furosemide (LASIX) 20 MG tablet Take 1 tablet (20 mg total) by mouth daily as needed. 30 tablet 3  . olmesartan (BENICAR) 20 MG tablet Take 1 tablet (20 mg total) by mouth daily. 30 tablet 3  . pantoprazole (PROTONIX) 40 MG tablet Take 1 tablet (40 mg total) by mouth daily. 30 tablet 0  . potassium chloride (KLOR-CON 10) 10 MEQ tablet Take 1 tablet (10 mEq total) by mouth daily as needed. Take w/Furosemide 30 tablet 3  . Respiratory Therapy Supplies (FLUTTER) DEVI Use as directed 1 each 0  . predniSONE (DELTASONE) 10 MG tablet Take  4 each am x 2 days,   2 each am x 2 days,  1 each am x 2 days and stop 14 tablet 0   No facility-administered medications prior to visit.     ROS Review of Systems  Constitutional: Positive for chills and fatigue. Negative for activity change, appetite change and unexpected weight change.  HENT: Positive for sore throat. Negative for congestion, mouth sores and sinus pressure.   Eyes: Negative for visual disturbance.  Respiratory: Positive for  cough, chest tightness and shortness of breath. Negative for wheezing.   Cardiovascular: Negative for chest pain and leg swelling.  Gastrointestinal: Negative for abdominal pain and nausea.  Genitourinary: Negative for difficulty urinating, frequency and vaginal pain.  Musculoskeletal: Negative for back pain and gait problem.  Skin: Negative for pallor and rash.  Neurological: Negative for dizziness, tremors, weakness, numbness and headaches.  Psychiatric/Behavioral: Negative for confusion and sleep disturbance.    Objective:  BP 108/68 (BP Location: Left Arm, Patient Position: Sitting, Cuff Size: Large)   Pulse 93   Temp 97.8 F (36.6 C) (Oral)   Ht 4\' 11"  (1.499 m)   Wt 164 lb (74.4 kg)   LMP 04/08/1996   SpO2 98%   BMI 33.12 kg/m   BP Readings from Last 3 Encounters:  01/01/17 108/68  12/02/16 114/68  11/20/16 120/78    Wt Readings from Last 3 Encounters:  01/01/17 164 lb (74.4 kg)  12/02/16 161 lb (73 kg)  11/20/16 165 lb (74.8 kg)    Physical Exam  Constitutional: She appears well-developed. No distress.  HENT:  Head: Normocephalic.  Right Ear: External ear normal.  Left Ear: External ear normal.  Nose: Nose normal.  Mouth/Throat: Oropharynx is clear and moist.  Eyes: Pupils are equal, round, and reactive to light. Conjunctivae are  normal. Right eye exhibits no discharge. Left eye exhibits no discharge.  Neck: Normal range of motion. Neck supple. No JVD present. No tracheal deviation present. No thyromegaly present.  Cardiovascular: Normal rate, regular rhythm and normal heart sounds.   Pulmonary/Chest: No stridor. No respiratory distress. She has no wheezes. She has rales (L lung).  Abdominal: Soft. Bowel sounds are normal. She exhibits no distension and no mass. There is no tenderness. There is no rebound and no guarding.  Musculoskeletal: She exhibits no edema or tenderness.  Lymphadenopathy:    She has no cervical adenopathy.  Neurological: She displays  normal reflexes. No cranial nerve deficit. She exhibits normal muscle tone. Coordination normal.  Skin: No rash noted. No erythema.  Psychiatric: She has a normal mood and affect. Her behavior is normal. Judgment and thought content normal.  coughing hard  Lab Results  Component Value Date   WBC 4.4 07/15/2016   HGB 12.3 07/15/2016   HCT 37.3 07/15/2016   PLT 184.0 07/15/2016   GLUCOSE 103 (H) 12/02/2016   CHOL 245 (H) 07/15/2016   TRIG 121.0 07/15/2016   HDL 47.80 07/15/2016   LDLDIRECT 154.9 05/01/2011   LDLCALC 173 (H) 07/15/2016   ALT 14 07/15/2016   AST 18 07/15/2016   NA 139 12/02/2016   K 4.0 12/02/2016   CL 103 12/02/2016   CREATININE 0.80 12/02/2016   BUN 18 12/02/2016   CO2 30 12/02/2016   TSH 0.67 07/15/2016   HGBA1C 5.9 11/20/2016    US Renal  Result Date: 08/01/2016 CLINICAL DATA:  Kidney cyst. EXAM: RENAL / URINARY TRACT ULTRASOUND COMPLETE COMPARISON:  Ultrasound of February 27, 2010. FINDINGS: Right Kidney: Length: 9.9 cm. Echogenicity within normal limits. No mass or hydronephrosis visualized. Left Kidney: Length: 10.5 cm. Stable size and appearance of 3.9 cm cyst with single septation seen arising from lower pole. Echogenicity within normal limits. No mass or hydronephrosis visualized. Bladder: Appears normal for degree of bladder distention. IMPRESSION: Stable 3.9 cm septated cyst in left kidney. No other renal abnormality seen. Electronically Signed   By: Marijo Conception, M.D.   On: 08/01/2016 17:13    Assessment & Plan:   There are no diagnoses linked to this encounter. I have discontinued Ms. Golda's predniSONE. I am also having her maintain her aspirin, cholecalciferol, FLUTTER, cyanocobalamin, pantoprazole, famotidine, fluticasone, potassium chloride, olmesartan, furosemide, and Fluticasone-Salmeterol.  No orders of the defined types were placed in this encounter.    Follow-up: No Follow-up on file.  Walker Kehr, MD

## 2017-01-10 ENCOUNTER — Encounter: Payer: Self-pay | Admitting: Pulmonary Disease

## 2017-01-10 ENCOUNTER — Ambulatory Visit (INDEPENDENT_AMBULATORY_CARE_PROVIDER_SITE_OTHER): Payer: Medicare Other | Admitting: Pulmonary Disease

## 2017-01-10 VITALS — BP 116/74 | HR 94 | Ht 59.0 in | Wt 164.0 lb

## 2017-01-10 DIAGNOSIS — R0600 Dyspnea, unspecified: Secondary | ICD-10-CM

## 2017-01-10 DIAGNOSIS — Z23 Encounter for immunization: Secondary | ICD-10-CM | POA: Diagnosis not present

## 2017-01-10 DIAGNOSIS — J189 Pneumonia, unspecified organism: Secondary | ICD-10-CM

## 2017-01-10 NOTE — Assessment & Plan Note (Addendum)
She does not seem to have symptoms of chronic sinusitis as a trigger. It is possible that she has mild bronchiectasis especially in the left lower lobe and that is why her recurrent episodes take longer to clear. The key here may be to catch an exacerbation early and treat her with a longer course of antibiotics  Obtain high-resolution CT of the chest Okay to take Mucinex 600 mg twice daily when you develop cough  Call us earlier for an antibiotic if you develop change in color of sputum-yellow or green

## 2017-01-10 NOTE — Patient Instructions (Addendum)
Obtain high-resolution CT of the chest Okay to take Mucinex 600 mg twice daily when you develop cough  Call us earlier for an antibiotic if you develop change in color of sputum-yellow or green  Lung function appears good - you do not need to take Advair except on an as-needed basis

## 2017-01-10 NOTE — Assessment & Plan Note (Signed)
Lung function appears good -  do not need to take Advair except on an as-needed basis

## 2017-01-10 NOTE — Progress Notes (Signed)
Subjective:    Patient ID: Diana Stout, female    DOB: 05-27-1930, 83 y.o.   MRN: [id number]  HPI  Chief Complaint  Patient presents with  . Pulm Consult    Referred by Dr. Alain Marion for reoccuring pneumonia. Has been hospitalized in the past for PNA. States other that the PNA, she is generally healthy and happy.    83 year old never smoker presents for evaluation of recurrent pneumonia she has been seen by my partner Dr. [name] 4 over has been labeled as upper airway cough syndrome. She presents today for second opinion  She reports prolonged cough starting 02/2016 that when until March 2018 and required multiple rounds of antibiotics to get better. Each time these episodes seem to start as a head cold and then settled in her chest and then it takes her a long time to clear. She again developed a head cold on 12/28/16 and was seen by urgent care and given Augmentin, PCP visit 9/26 5 like she recommended required pneumonia in the left lower lobe. I reviewed the chest x-ray film which appears clear but shows prominent airway markings in the left lower lobe. I have also reviewed CT chest images from 2013 and 2014 which suggests mild bronchiectasis  She has been using Mucinex and a flutter valve, she uses Advair on a when necessary basis. She reports onset of these type of recurrent symptoms since her episode of pneumonia about 6 years ago. She is a lifetime never smoker and leads an active lifestyle. She reports that she when she sold her house a few years ago, radon levels were noted to be high. She reports occasional reflux but does not need Protonix daily, she does sleep on a wedge pillow      Significant tests/ events reviewed CT chest 04/2012 stable pulmonary nodules compared to 05/2011, small  hiatal hernia  Spirometry shows no evidence of airway obstruction   Past Medical History:  Diagnosis Date  . Diverticulosis of colon   . DVT (deep venous thrombosis) ([place]) 1998   hx of  right  . Esophagitis 2006  . GERD (gastroesophageal reflux disease)   . Glucose intolerance (impaired glucose tolerance)   . History of shingles   . Hyperlipidemia   . Hypertension   . IBS (irritable bowel syndrome)   . Impaired glucose tolerance 11/22/2010  . Kidney cysts    left- Dr [name]  . Multiple lung nodules 2012   seen on CT scan by Dr [name], pulmonologist  . Osteoporosis   . Palpitation    Past Surgical History:  Procedure Laterality Date  . APPENDECTOMY    . CHOLECYSTECTOMY    . HYSTEROSCOPY WITH RESECTOSCOPE  7/99   resect polyp  . TUBAL LIGATION Bilateral    Allergies  Allergen Reactions  . Atorvastatin Nausea And Vomiting    REACTION: Gi pain  . Alendronate Sodium Other (See Comments)    REACTION: esophagus problem  . Ceftriaxone Sodium Rash    REACTION: rash  . Codeine Phosphate Other (See Comments)    REACTION: nightmares  . Moxifloxacin Palpitations    REACTION: nausea/chest pain She can take Cipro  . Simvastatin Other (See Comments)    indigestion      Social History   Social History  . Marital status: Widowed    Spouse name: N/A  . Number of children: 3  . Years of education: N/A   Occupational History  . Retired Retired   Social History Main Topics  . Smoking  status: Never Smoker  . Smokeless tobacco: Never Used  . Alcohol use No  . Drug use: No  . Sexual activity: No   Other Topics Concern  . Not on file   Social History Narrative  . No narrative on file     Family History  Problem Relation Age of Onset  . Asthma Mother   . Diabetes Father   . Cancer Father        liver  . Cancer Sister        breast  . Diabetes Sister   . Asthma Sister   . Breast cancer Sister   . Diabetes Brother   . Asthma Brother   . Asthma Sister   . Diabetes Brother   . Coronary artery disease Other        1 st degree female relative  . Thyroid disease Maternal Uncle      Review of Systems  Positive for shortness of breath with  activity, nonproductive cough  Constitutional: negative for anorexia, fevers and sweats  Eyes: negative for irritation, redness and visual disturbance  Ears, nose, mouth, throat, and face: negative for earaches, epistaxis, nasal congestion and sore throat  Respiratory: negative for cough, dyspnea on exertion, sputum and wheezing  Cardiovascular: negative for chest pain,  lower extremity edema, orthopnea, palpitations and syncope  Gastrointestinal: negative for abdominal pain, constipation, diarrhea, melena, nausea and vomiting  Genitourinary:negative for dysuria, frequency and hematuria  Hematologic/lymphatic: negative for bleeding, easy bruising and lymphadenopathy  Musculoskeletal:negative for arthralgias, muscle weakness and stiff joints  Neurological: negative for coordination problems, gait problems, headaches and weakness  Endocrine: negative for diabetic symptoms including polydipsia, polyuria and weight loss     Objective:   Physical Exam   Gen. Pleasant, obese, in no distress, normal affect ENT -  no post nasal drip, class 2 airway Neck: No JVD, no thyromegaly, no carotid bruits Lungs: no use of accessory muscles, no dullness to percussion, left basal fine rales, no rhonchi  Cardiovascular: Rhythm regular, heart sounds  normal, no murmurs or gallops, no peripheral edema Abdomen: soft and non-tender, no hepatosplenomegaly, BS normal. Musculoskeletal: No deformities, no cyanosis or clubbing Neuro:  alert, non focal, no tremors        Assessment & Plan:

## 2017-01-20 DIAGNOSIS — H04123 Dry eye syndrome of bilateral lacrimal glands: Secondary | ICD-10-CM | POA: Diagnosis not present

## 2017-01-24 ENCOUNTER — Ambulatory Visit (INDEPENDENT_AMBULATORY_CARE_PROVIDER_SITE_OTHER)
Admission: RE | Admit: 2017-01-24 | Discharge: 2017-01-24 | Disposition: A | Discharge status: Home / Self Care | Payer: Medicare Other | Source: Ambulatory Visit | Attending: Pulmonary Disease | Admitting: Pulmonary Disease

## 2017-01-24 DIAGNOSIS — J189 Pneumonia, unspecified organism: Secondary | ICD-10-CM | POA: Diagnosis not present

## 2017-01-27 ENCOUNTER — Telehealth: Payer: Self-pay | Admitting: Internal Medicine

## 2017-01-27 ENCOUNTER — Telehealth: Payer: Self-pay | Admitting: Pulmonary Disease

## 2017-01-27 DIAGNOSIS — I2510 Atherosclerotic heart disease of native coronary artery without angina pectoris: Secondary | ICD-10-CM

## 2017-01-27 NOTE — Telephone Encounter (Signed)
Pt called and would like for Dr. Camila Li to read her CT scan she had on 10/19. She states there was something mentioned in the report pertaining to her heart, please advise, does pt need an appointment?

## 2017-01-27 NOTE — Telephone Encounter (Signed)
Mild scarring in both lungs No reason to explain recurrent pneumonia Tiny nodules in both lung less than 6 mm but these were unchanged frompo CT from 2014, so definitely benign Please have her call us early next time she gets an episode of bronchitis, change in color of sputum.   Spoke with patient. She is aware of results. Nothing else needed at time of call.

## 2017-01-28 NOTE — Telephone Encounter (Signed)
Note    Mild scarring in both lungs No reason to explain recurrent pneumonia Tiny nodules in both lung less than 6 mm but these were unchanged frompo CT from 2014, so definitely benign Please have her call us early next time she gets an episode of bronchitis, change in color of sputum.   Spoke with patient. She is aware of results. Nothing else needed at time of call.       Patient states she only got the results through mychart, please advise about CT results

## 2017-01-28 NOTE — Telephone Encounter (Signed)
As per Dr Bari Mantis comments  There was also hardening of the coronary arteries present (age related) - continue w/aspirin. We can discuss another trial of a lipid lowering Rx like Livalo at the next OV Thx

## 2017-01-29 ENCOUNTER — Ambulatory Visit: Payer: Medicare Other | Admitting: Internal Medicine

## 2017-01-29 NOTE — Telephone Encounter (Signed)
Pt has been informed and expressed understanding. She is concerned about the blockages and would like to see a cardiologist, Fay Records, MD, he is a Financial controller doctor she has seen before. Please advise on further instructions. She didn't want to wait to discuss until next OV since that is scheduled in December.

## 2017-01-30 NOTE — Telephone Encounter (Signed)
Ok Thx 

## 2017-03-24 ENCOUNTER — Telehealth: Payer: Self-pay

## 2017-03-24 NOTE — Telephone Encounter (Signed)
Would advise if this was the only reason for visit okay to cancel. If she has other concerns or illness she wishes to discuss with Dr. Alain Marion keep the visit on Wednesday.

## 2017-03-24 NOTE — Telephone Encounter (Signed)
Copied from [place]. Topic: Inquiry >> Mar 24, 2017  9:12 AM Boyd Kerbs wrote: Reason for CRM: Patient has appt this Wed. 12/19 at 9:30.. Dr. Alain Marion referred her to cardiologist and her appt is Jan. 7th.  And she is ask if she should wait until after she sees the Cardiologist before she came back.  Does she need the Wed. Appt or  reschedule  She said call home phone 1st and then the cell.  She said can leave message if needs wed. Appt. >> Mar 24, 2017  3:15 PM Oliver Pila B wrote: Pt called to check the status of her question of coming in before or after seeing the cardiologist

## 2017-03-24 NOTE — Telephone Encounter (Signed)
LM notifying pt

## 2017-03-26 ENCOUNTER — Ambulatory Visit: Payer: Medicare Other | Admitting: Internal Medicine

## 2017-04-04 NOTE — Progress Notes (Signed)
Cardiology Office Note   Date:  04/14/2017   ID:  Diana Stout, DOB 11-29-1930, MRN [id number]  PCP:  [name], MD  Cardiologist:   Jenkins Rouge, MD   No chief complaint on file.     History of Present Illness: Diana Stout is a 83 y.o. female who presents for evaluation of CAD Referred by Dr [name]. Last seen by me in 2011 For syncope CRF;s include HLD, HTN. She takes diuretic for chronic LE edema. Has had normal myovue in march of 2017  With EF 68% as well as in 2010 Echo in February 2016 also normal EF 55-60% no valve disease. She has seen Dr Elsworth Soho for Recurring pneumonia. Not a smoker CT chest suggests mild bronchiectasis CT chest reviewed 01/24/17 with no evidence of ILD mention of 2 vessel CAD. LAD and circumflex She has been intolerant to Lipitor and simvastatin in past   She has no cardiac complaints Takes diuretic once weekly for LE edema.   Friend of my moms     Past Medical History:  Diagnosis Date  . Abdominal pain, lower 11/22/2010   2015 resolved off Zocor 5/17?urticaria w/GI upset: Dtr is allergic to seafood - the pt is on MegaRed krill oil - d/c MegaRed   . Abnormal chest CT 05/22/2011   Followed in Pulmonary clinic/ [facility]/ [place]  05/14/11 CT CHEST WITHOUT CONTRAST 1. Multiple small pulmonary nodules scattered throughout the lungs  bilaterally ranging in size from 3-6 mm.    2. There is a small hiatal hernia.  3. Atherosclerosis.  4. Status post cholecystectomy.  5. Low attenuation hepatic lesions unchanged compared to recent  abdominal CT scan, likely to represent cysts  . Acute pharyngitis 02/28/2007   Qualifier: Diagnosis of  By: [name] MD, [name]   . ANEMIA, NORMOCYTIC 08/10/2009   mild    . BRONCHITIS, ACUTE 06/12/2007   Qualifier: Diagnosis of  By: Plotnikov MD, [name] Canker sore 01/01/2017   9/18   x1  . CAP (community acquired pneumonia) 01/01/2017   9/18 probable L lung  . Chest pain, atypical 06/22/2014   06/22/14  ?etiology   . Cough 04/20/2007   Qualifier: Diagnosis of  By: Plotnikov MD, [name]   . Diarrhea 07/19/2013   4/15 x 1 mo ?etiol 8/18  . DIVERTICULOSIS, COLON 03/08/2007   Qualifier: Diagnosis of  By: [name] MD, [name]   . Dizziness 07/19/2013   Likely due to diarrhea/meds 4/15   . DVT (deep venous thrombosis) ([place]) 1998   hx of right  . DVT, HX OF 02/02/2007   Qualifier: Diagnosis of  By: [name]    . Dyslipidemia 02/02/2007   Chronic 1/13 worse    . Dyspnea 07/01/2008   2/13 improved w/reg exercise 1/17 deconditioning - CL stress test was nl    . Dysuria 05/18/2013   2/15, 1/17   . Edema 09/27/2009   Qualifier: Diagnosis of  By: [name], MD, [name]   . ESOPHAGEAL STRICTURE 03/06/2007   Qualifier: Diagnosis of  By: [name] MD, [name]   . Esophagitis 2006  . Essential hypertension 03/08/2007   Chronic  On Benicar   . Fatigue 10/15/2011   2015 resolved off Zocor   . FOOT PAIN 07/25/2009   R foot OA    . GERD 02/02/2007   Wedge pillow Protonix prn, Pepcid - d/c  . GLUCOSE INTOLERANCE 03/06/2007   Qualifier: Diagnosis of  By: [name]  MD, [name]   . History of shingles   . Hyperlipidemia   . Hypoxemia 08/01/2009   Qualifier: Diagnosis of  By: Plotnikov MD, [name]   . IBS 03/06/2007   Qualifier: Diagnosis of  By: [name] MD, [name]   . Impaired glucose tolerance 11/22/2010  . Kidney cyst, acquired 07/22/2016   Remote   . Kidney cysts    left- Dr [name]  . Lung nodule 05/08/2011   04/15/11 RLL 4 mm nodule on abd CT - [facility] office   . Multiple lung nodules 2012   seen on CT scan by Dr [name], pulmonologist  . Nausea alone 07/01/2008   2015 resolved off Zocor    . Obesity 01/03/2015  . Osteoporosis 02/02/2007   Chronic  Vit D, yoga  . Palpitations 05/12/2014   Dr [name] 2/16 poss due to steroid pack   . Rash and nonspecific skin eruption 09/12/2015   5/17?urticaria Dtr is allergic to seafood - the pt is on MegaRed krill oil - d/c MegaRed   . Recurrent pneumonia 08/15/2009    Qualifier: Diagnosis of  By: Plotnikov MD, [name]   . Right-sided thoracic back pain 05/12/2014   2/16 MSK   . SHINGLES, HX OF 03/06/2007   Qualifier: Diagnosis of  By: [name] MD, [name]   . SINUSITIS- ACUTE-NOS 03/06/2007   Qualifier: Diagnosis of  By: [name] MD, [name]   . SKIN RASH 08/10/2009   Qualifier: Diagnosis of  By: Plotnikov MD, [name]   . Snoring 12/22/2013   Worse 9/15   . Upper airway cough syndrome 12/08/2014   Dr [name] Flutter valve added 01/02/2015 > resolved 02/03/2015  - rec try off gerd rx p 03/09/15   . URI, acute 01/01/2011   9/12, 7/13, 1/14, 2/15, 9/16   . UTI 07/19/2008   Qualifier: Diagnosis of  By: Plotnikov MD, [name]   . Wheezing 12/08/2014    Past Surgical History:  Procedure Laterality Date  . APPENDECTOMY    . CHOLECYSTECTOMY    . HYSTEROSCOPY WITH RESECTOSCOPE  7/99   resect polyp  . TUBAL LIGATION Bilateral      Current Outpatient Medications  Medication Sig Dispense Refill  . aspirin 81 MG tablet Take 81 mg by mouth daily.      . cholecalciferol (VITAMIN D) 1000 UNITS tablet Take 1,000 Units by mouth daily.    . cyanocobalamin 1000 MCG tablet Take 1,000 mcg by mouth daily.    . famotidine (PEPCID) 20 MG tablet Take 1 tablet (20 mg total) by mouth 2 (two) times daily. 60 tablet 0  . fluticasone (FLONASE) 50 MCG/ACT nasal spray Place 2 sprays into both nostrils daily. 16 g 5  . Fluticasone-Salmeterol (ADVAIR DISKUS) 100-50 MCG/DOSE AEPB Inhale 1 puff into the lungs 2 (two) times daily. 1 each 3  . furosemide (LASIX) 20 MG tablet Take 1 tablet (20 mg total) by mouth daily as needed. 30 tablet 3  . olmesartan (BENICAR) 20 MG tablet Take 1 tablet (20 mg total) by mouth daily. 30 tablet 3  . pantoprazole (PROTONIX) 40 MG tablet Take 1 tablet (40 mg total) by mouth daily. 30 tablet 0  . potassium chloride (KLOR-CON 10) 10 MEQ tablet Take 1 tablet (10 mEq total) by mouth daily as needed. Take w/Furosemide 30 tablet 3  . Probiotic Product (ALIGN) 4 MG  CAPS Take 1 capsule (4 mg total) by mouth daily. 30 capsule 0  . Respiratory Therapy Supplies (FLUTTER) DEVI Use as  directed 1 each 0  . triamcinolone (KENALOG) 0.1 % paste Use as directed 1 application in the mouth or throat 3 (three) times daily. To the mouth sore 5 g 1  . valACYclovir (VALTREX) 500 MG tablet Take 1 tablet (500 mg total) by mouth 2 (two) times daily. 14 tablet 1   No current facility-administered medications for this visit.     Allergies:   Atorvastatin; Alendronate sodium; Ceftriaxone sodium; Codeine phosphate; Moxifloxacin; and Simvastatin    Social History:  The patient  reports that  has never smoked. she has never used smokeless tobacco. She reports that she does not drink alcohol or use drugs.   Family History:  The patient's family history includes Asthma in her brother, mother, sister, and sister; Breast cancer in her sister; Cancer in her father and sister; Coronary artery disease in her other; Diabetes in her brother, brother, father, and sister; Thyroid disease in her maternal uncle.    ROS:  Please see the history of present illness.   Otherwise, review of systems are positive for none.   All other systems are reviewed and negative.    PHYSICAL EXAM: VS:  BP (!) 146/88   Pulse 90   Ht 4\' 11"  (1.499 m)   Wt 170 lb 8 oz (77.3 kg)   LMP 04/08/1996   SpO2 93%   BMI 34.44 kg/m  , BMI Body mass index is 34.44 kg/m. Affect appropriate Healthy:  appears stated age 26: normal Neck supple with no adenopathy JVP normal no bruits no thyromegaly Lungs clear with no wheezing and good diaphragmatic motion Heart:  S1/S2 no murmur, no rub, gallop or click PMI normal Abdomen: benighn, BS positve, no tenderness, no AAA no bruit.  No HSM or HJR Distal pulses intact with no bruits No edema Neuro non-focal Skin warm and dry No muscular weakness    EKG: 09/03/15 SR poor R wave progression normal ST segments  04/14/16 SR rate 85 poor R wave progression     Recent Labs: 07/15/2016: ALT 14; Hemoglobin 12.3; Platelets 184.0; TSH 0.67 12/02/2016: BUN 18; Creatinine, Ser 0.80; Potassium 4.0; Sodium 139    Lipid Panel    Component Value Date/Time   CHOL 245 (H) 07/15/2016 1109   TRIG 121.0 07/15/2016 1109   TRIG 145 03/03/2006 1154   HDL 47.80 07/15/2016 1109   CHOLHDL 5 07/15/2016 1109   VLDL 24.2 07/15/2016 1109   LDLCALC 173 (H) 07/15/2016 1109   LDLDIRECT 154.9 05/01/2011 1030      Wt Readings from Last 3 Encounters:  04/14/17 170 lb 8 oz (77.3 kg)  01/10/17 164 lb (74.4 kg)  01/01/17 164 lb (74.4 kg)      Other studies Reviewed: Additional studies/ records that were reviewed today include: notes Dr Elsworth Soho and [name]  ECG;s CT chest .    ASSESSMENT AND PLAN:  1.  CAD:  Seen on CT chest which appears age appropriate. Atypical pain in past with multiple normal myovues most Recently 06/09/15.  No cardiac symptoms Age expected calcium on CT  2. HTN:  Well controlled.  Continue current medications and low sodium Dash type diet.   3. HLD:  Intolerant to Lipitor and simvastatin LDL 07/15/16 was 173  Consider zetia 4. Bronchiectasis:  F/u [place] sinusitis may be trigger for multiple pneumonias. CT with no ILD    Current medicines are reviewed at length with the patient today.  The patient does not have concerns regarding medicines.  The following changes have been made:  no change  Labs/ tests ordered today include: None   Orders Placed This Encounter  Procedures  . EKG 12-Lead     Disposition:   FU with cardiology PRN      Signed, Jenkins Rouge, MD  04/14/2017 10:07 AM    [address], [place], [place]  [address] Phone: [phone]; Fax: [phone]

## 2017-04-14 ENCOUNTER — Ambulatory Visit (INDEPENDENT_AMBULATORY_CARE_PROVIDER_SITE_OTHER): Payer: Medicare Other | Admitting: Cardiovascular Disease

## 2017-04-14 ENCOUNTER — Encounter: Payer: Self-pay | Admitting: Cardiovascular Disease

## 2017-04-14 VITALS — BP 146/88 | HR 90 | Ht 59.0 in | Wt 170.5 lb

## 2017-04-14 DIAGNOSIS — I10 Essential (primary) hypertension: Secondary | ICD-10-CM | POA: Diagnosis not present

## 2017-04-14 NOTE — Patient Instructions (Addendum)
Medication Instructions:  Your physician recommends that you continue on your current medications as directed. Please refer to the Current Medication list given to you today.  Labwork: NONE  Testing/Procedures: NONE  Follow-Up: Your physician wants you to follow-up as needed with  Dr. Nishan.    If you need a refill on your cardiac medications before your next appointment, please call your pharmacy.    

## 2017-04-23 DIAGNOSIS — Z85828 Personal history of other malignant neoplasm of skin: Secondary | ICD-10-CM | POA: Diagnosis not present

## 2017-04-23 DIAGNOSIS — D485 Neoplasm of uncertain behavior of skin: Secondary | ICD-10-CM | POA: Diagnosis not present

## 2017-04-23 DIAGNOSIS — D692 Other nonthrombocytopenic purpura: Secondary | ICD-10-CM | POA: Diagnosis not present

## 2017-04-23 DIAGNOSIS — L814 Other melanin hyperpigmentation: Secondary | ICD-10-CM | POA: Diagnosis not present

## 2017-04-23 DIAGNOSIS — B078 Other viral warts: Secondary | ICD-10-CM | POA: Diagnosis not present

## 2017-04-23 DIAGNOSIS — L820 Inflamed seborrheic keratosis: Secondary | ICD-10-CM | POA: Diagnosis not present

## 2017-04-23 DIAGNOSIS — L821 Other seborrheic keratosis: Secondary | ICD-10-CM | POA: Diagnosis not present

## 2017-04-23 DIAGNOSIS — D1801 Hemangioma of skin and subcutaneous tissue: Secondary | ICD-10-CM | POA: Diagnosis not present

## 2017-04-23 DIAGNOSIS — L570 Actinic keratosis: Secondary | ICD-10-CM | POA: Diagnosis not present

## 2017-04-23 DIAGNOSIS — D2261 Melanocytic nevi of right upper limb, including shoulder: Secondary | ICD-10-CM | POA: Diagnosis not present

## 2017-05-14 ENCOUNTER — Ambulatory Visit (INDEPENDENT_AMBULATORY_CARE_PROVIDER_SITE_OTHER): Payer: Medicare Other | Admitting: Pulmonary Disease

## 2017-05-14 ENCOUNTER — Encounter: Payer: Self-pay | Admitting: Pulmonary Disease

## 2017-05-14 DIAGNOSIS — J189 Pneumonia, unspecified organism: Secondary | ICD-10-CM | POA: Diagnosis not present

## 2017-05-14 DIAGNOSIS — K219 Gastro-esophageal reflux disease without esophagitis: Secondary | ICD-10-CM | POA: Diagnosis not present

## 2017-05-14 NOTE — Assessment & Plan Note (Signed)
Reflux has resolved and she is off medications

## 2017-05-14 NOTE — Assessment & Plan Note (Signed)
No clear cause identified, she may have mild bronchiectasis which was not really commented on prior report on HRCT.  Our strategy here will be to treat symptoms early Call as early for symptoms of bronchitis or pneumonia such as fever, change in color of sputum or excessive coughing

## 2017-05-14 NOTE — Patient Instructions (Signed)
Call as early for symptoms of bronchitis or pneumonia such as fever, change in color of sputum or excessive coughing

## 2017-05-14 NOTE — Progress Notes (Signed)
   Subjective:    Patient ID: Diana Stout, female    DOB: 1930/05/19, 84 y.o.   MRN: [id number]  HPI 84 year old never smoker for FU of recurrent pneumonia   She has done well over the past few months, no episodes of bronchitis or pneumonia. She denies cough or wheezing or shortness of breath. Reflexes old and infectious stop taking Pepcid or Protonix.  She has a dinner around 4 PM and does sleep on a wedge pillow We reviewed high-resolution CT chest  Significant tests/ events reviewed CT chest 04/2012 stable pulmonary nodules compared to 05/2011, small  hiatal hernia  Spirometry shows no evidence of airway obstruction  CT chest images from 2013 and 2014 >> mild bronchiectasis HRCT 01/2017 no bronchiectasis, mild air trapping   Review of Systems Patient denies significant dyspnea,cough, hemoptysis,  chest pain, palpitations, pedal edema, orthopnea, paroxysmal nocturnal dyspnea, lightheadedness, nausea, vomiting, abdominal or  leg pains      Objective:   Physical Exam  Gen. Pleasant, well-nourished, in no distress ENT - no thrush, no post nasal drip Neck: No JVD, no thyromegaly, no carotid bruits Lungs: no use of accessory muscles, no dullness to percussion, clear without rales or rhonchi  Cardiovascular: Rhythm regular, heart sounds  normal, no murmurs or gallops, no peripheral edema Musculoskeletal: No deformities, no cyanosis or clubbing        Assessment & Plan:

## 2017-06-03 ENCOUNTER — Other Ambulatory Visit: Payer: Self-pay

## 2017-06-03 ENCOUNTER — Ambulatory Visit (INDEPENDENT_AMBULATORY_CARE_PROVIDER_SITE_OTHER): Payer: Medicare Other | Admitting: Obstetrics & Gynecology

## 2017-06-03 ENCOUNTER — Encounter: Payer: Self-pay | Admitting: Obstetrics & Gynecology

## 2017-06-03 VITALS — BP 114/76 | HR 92 | Resp 16 | Ht 58.5 in | Wt 169.0 lb

## 2017-06-03 DIAGNOSIS — Z01419 Encounter for gynecological examination (general) (routine) without abnormal findings: Secondary | ICD-10-CM | POA: Diagnosis not present

## 2017-06-03 DIAGNOSIS — Z124 Encounter for screening for malignant neoplasm of cervix: Secondary | ICD-10-CM | POA: Diagnosis not present

## 2017-06-03 NOTE — Progress Notes (Signed)
84 y.o. [id number] WidowedCaucasianF here for annual exam.  Just got over pneumonia.  Was on antibiotics for pneumonia.  Did have updated Pneumovax vaccine.  Denies vaginal bleeding.      Patient's last menstrual period was 04/08/1996.          Sexually active: No.  The current method of family planning is post menopausal status.    Exercising: Yes.    aerobics, weights, yoga Smoker:  no  Health Maintenance: Pap:  02/16/16 Neg   11/05/13 Neg  History of abnormal Pap:  no MMG:  02/07/16 BIRADS1:neg Colonoscopy:  05/2010 polyps. F/u 10 years  BMD:   11/17/14 Osteopenia  TDaP:  2017 Pneumonia vaccine(s):  2014 Shingrix:   Zostavax 2008 Hep C testing: n/a Screening Labs: PCP   reports that  has never smoked. she has never used smokeless tobacco. She reports that she does not drink alcohol or use drugs.  Past Medical History:  Diagnosis Date  . Abdominal pain, lower 11/22/2010   2015 resolved off Zocor 5/17?urticaria w/GI upset: Dtr is allergic to seafood - the pt is on MegaRed krill oil - d/c MegaRed   . Abnormal chest CT 05/22/2011   Followed in Pulmonary clinic/ [facility]/ [place]  05/14/11 CT CHEST WITHOUT CONTRAST 1. Multiple small pulmonary nodules scattered throughout the lungs  bilaterally ranging in size from 3-6 mm.    2. There is a small hiatal hernia.  3. Atherosclerosis.  4. Status post cholecystectomy.  5. Low attenuation hepatic lesions unchanged compared to recent  abdominal CT scan, likely to represent cysts  . Acute pharyngitis 02/28/2007   Qualifier: Diagnosis of  By: [name] MD, [name]   . ANEMIA, NORMOCYTIC 08/10/2009   mild    . BRONCHITIS, ACUTE 06/12/2007   Qualifier: Diagnosis of  By: Plotnikov MD, [name] Canker sore 01/01/2017   9/18   x1  . CAP (community acquired pneumonia) 01/01/2017   9/18 probable L lung  . Chest pain, atypical 06/22/2014   06/22/14 ?etiology   . Cough 04/20/2007   Qualifier: Diagnosis of  By: Plotnikov MD, [name]   . Diarrhea  07/19/2013   4/15 x 1 mo ?etiol 8/18  . DIVERTICULOSIS, COLON 03/08/2007   Qualifier: Diagnosis of  By: [name] MD, [name]   . Dizziness 07/19/2013   Likely due to diarrhea/meds 4/15   . DVT (deep venous thrombosis) ([place]) 1998   hx of right  . DVT, HX OF 02/02/2007   Qualifier: Diagnosis of  By: [name]    . Dyslipidemia 02/02/2007   Chronic 1/13 worse    . Dyspnea 07/01/2008   2/13 improved w/reg exercise 1/17 deconditioning - CL stress test was nl    . Dysuria 05/18/2013   2/15, 1/17   . Edema 09/27/2009   Qualifier: Diagnosis of  By: [name], MD, [name]   . ESOPHAGEAL STRICTURE 03/06/2007   Qualifier: Diagnosis of  By: [name] MD, [name]   . Esophagitis 2006  . Essential hypertension 03/08/2007   Chronic  On Benicar   . Fatigue 10/15/2011   2015 resolved off Zocor   . FOOT PAIN 07/25/2009   R foot OA    . GERD 02/02/2007   Wedge pillow Protonix prn, Pepcid - d/c  . GLUCOSE INTOLERANCE 03/06/2007   Qualifier: Diagnosis of  By: [name] MD, [name]   . History of shingles   . Hyperlipidemia   . Hypoxemia 08/01/2009   Qualifier: Diagnosis of  By: Alain Marion MD, [name] IBS 03/06/2007   Qualifier: Diagnosis of  By: [name] MD, [name]   . Impaired glucose tolerance 11/22/2010  . Kidney cyst, acquired 07/22/2016   Remote   . Kidney cysts    left- Dr [name]  . Lung nodule 05/08/2011   04/15/11 RLL 4 mm nodule on abd CT - [facility]rol office   . Multiple lung nodules 2012   seen on CT scan by Dr [name], pulmonologist  . Nausea alone 07/01/2008   2015 resolved off Zocor    . Obesity 01/03/2015  . Osteoporosis 02/02/2007   Chronic  Vit D, yoga  . Palpitations 05/12/2014   Dr [name] 2/16 poss due to steroid pack   . Pneumonia 12/2016  . Rash and nonspecific skin eruption 09/12/2015   5/17?urticaria Dtr is allergic to seafood - the pt is on MegaRed krill oil - d/c MegaRed   . Recurrent pneumonia 08/15/2009   Qualifier: Diagnosis of  By: Plotnikov MD, [name]   . Right-sided  thoracic back pain 05/12/2014   2/16 MSK   . SHINGLES, HX OF 03/06/2007   Qualifier: Diagnosis of  By: [name] MD, [name]   . SINUSITIS- ACUTE-NOS 03/06/2007   Qualifier: Diagnosis of  By: [name] MD, [name]   . SKIN RASH 08/10/2009   Qualifier: Diagnosis of  By: Plotnikov MD, [name]   . Snoring 12/22/2013   Worse 9/15   . Upper airway cough syndrome 12/08/2014   Dr [name] Flutter valve added 01/02/2015 > resolved 02/03/2015  - rec try off gerd rx p 03/09/15   . URI, acute 01/01/2011   9/12, 7/13, 1/14, 2/15, 9/16   . UTI 07/19/2008   Qualifier: Diagnosis of  By: Plotnikov MD, [name]   . Wheezing 12/08/2014    Past Surgical History:  Procedure Laterality Date  . APPENDECTOMY    . CHOLECYSTECTOMY    . HYSTEROSCOPY WITH RESECTOSCOPE  7/99   resect polyp  . TUBAL LIGATION Bilateral     Current Outpatient Medications  Medication Sig Dispense Refill  . aspirin 81 MG tablet Take 81 mg by mouth daily.      . cholecalciferol (VITAMIN D) 1000 UNITS tablet Take 1,000 Units by mouth daily.    . cyanocobalamin 1000 MCG tablet Take 1,000 mcg by mouth daily.    . famotidine (PEPCID) 20 MG tablet Take 1 tablet (20 mg total) by mouth 2 (two) times daily. 60 tablet 0  . fluticasone (FLONASE) 50 MCG/ACT nasal spray Place 2 sprays into both nostrils daily. 16 g 5  . Fluticasone-Salmeterol (ADVAIR DISKUS) 100-50 MCG/DOSE AEPB Inhale 1 puff into the lungs 2 (two) times daily. 1 each 3  . furosemide (LASIX) 20 MG tablet Take 1 tablet (20 mg total) by mouth daily as needed. 30 tablet 3  . olmesartan (BENICAR) 20 MG tablet Take 1 tablet (20 mg total) by mouth daily. (Patient taking differently: Take 10 mg by mouth daily. ) 30 tablet 3  . pantoprazole (PROTONIX) 40 MG tablet Take 1 tablet (40 mg total) by mouth daily. 30 tablet 0  . potassium chloride (KLOR-CON 10) 10 MEQ tablet Take 1 tablet (10 mEq total) by mouth daily as needed. Take w/Furosemide 30 tablet 3  . Probiotic Product (ALIGN) 4 MG CAPS Take 1  capsule (4 mg total) by mouth daily. 30 capsule 0  . Respiratory Therapy Supplies (FLUTTER) DEVI Use as directed 1 each 0   No current facility-administered medications for  this visit.     Family History  Problem Relation Age of Onset  . Asthma Mother   . Diabetes Father   . Cancer Father        liver  . Cancer Sister        breast  . Diabetes Sister   . Asthma Sister   . Breast cancer Sister   . Diabetes Brother   . Asthma Brother   . Asthma Sister   . Diabetes Brother   . Coronary artery disease Other        1 st degree female relative  . Thyroid disease Maternal Uncle     ROS:  Pertinent items are noted in HPI.  Otherwise, a comprehensive ROS was negative.  Exam:   BP 114/76 (BP Location: Left Arm, Patient Position: Sitting, Cuff Size: Large)   Pulse 92   Resp 16   Ht 4' 10.5" (1.486 m)   Wt 169 lb (76.7 kg)   LMP 04/08/1996   BMI 34.72 kg/m   Weight change: @WEIGHTCHANGE @ Height:   Height: 4' 10.5" (148.6 cm)  Ht Readings from Last 3 Encounters:  06/03/17 4' 10.5" (1.486 m)  05/14/17 4\' 11"  (1.499 m)  04/14/17 4\' 11"  (1.499 m)    General appearance: alert, cooperative and appears stated age Head: Normocephalic, without obvious abnormality, atraumatic Neck: no adenopathy, supple, symmetrical, trachea midline and thyroid normal to inspection and palpation Lungs: clear to auscultation bilaterally Breasts: normal appearance, no masses or tenderness Heart: regular rate and rhythm Abdomen: soft, non-tender; bowel sounds normal; no masses,  no organomegaly Extremities: extremities normal, atraumatic, no cyanosis or edema Skin: Skin color, texture, turgor normal. No rashes or lesions Lymph nodes: Cervical, supraclavicular, and axillary nodes normal. No abnormal inguinal nodes palpated Neurologic: Grossly normal   Pelvic: External genitalia:  no lesions              Urethra:  normal appearing urethra with no masses, tenderness or lesions              Bartholins and  Skenes: normal                 Vagina: normal appearing vagina with normal color and discharge, no lesions, third-degree cystocele with second-degree uterine prolapse noted              Cervix: no lesions              Pap taken: No. Bimanual Exam:  Uterus:  normal size, contour, position, consistency, mobility, non-tender              Adnexa: no mass, fullness, tenderness               Rectovaginal: Confirms               Anus:  normal sphincter tone, no lesions  Chaperone was present for exam.  A:  Well Woman with normal exam PMP, no HRT Osteopenia Incomplete uterine prolapse with cystocele  P:   Mammogram guidelines reviewed.  Doing yearly mammogram per her desires pap smear not obtained today. Lab work done with primary care, Dr. Alain Marion Discussed with patient new shingles vaccine.  States she is on with this. return annually or prn

## 2017-07-18 ENCOUNTER — Ambulatory Visit (INDEPENDENT_AMBULATORY_CARE_PROVIDER_SITE_OTHER): Payer: Medicare Other

## 2017-07-18 ENCOUNTER — Encounter: Payer: Self-pay | Admitting: Podiatry

## 2017-07-18 ENCOUNTER — Ambulatory Visit (INDEPENDENT_AMBULATORY_CARE_PROVIDER_SITE_OTHER): Payer: Medicare Other | Admitting: Podiatry

## 2017-07-18 VITALS — BP 121/69 | HR 75

## 2017-07-18 DIAGNOSIS — M659 Synovitis and tenosynovitis, unspecified: Secondary | ICD-10-CM

## 2017-07-18 DIAGNOSIS — M2041 Other hammer toe(s) (acquired), right foot: Secondary | ICD-10-CM

## 2017-07-18 DIAGNOSIS — M779 Enthesopathy, unspecified: Secondary | ICD-10-CM

## 2017-07-31 ENCOUNTER — Other Ambulatory Visit: Payer: Self-pay

## 2017-07-31 MED ORDER — OLMESARTAN MEDOXOMIL 20 MG PO TABS: 20.0000 mg | ORAL_TABLET | Freq: Every day | ORAL | 0 refills | Status: DC

## 2017-08-01 ENCOUNTER — Ambulatory Visit (INDEPENDENT_AMBULATORY_CARE_PROVIDER_SITE_OTHER): Payer: Medicare Other | Admitting: Podiatry

## 2017-08-01 DIAGNOSIS — M7750 Other enthesopathy of unspecified foot: Secondary | ICD-10-CM

## 2017-08-01 DIAGNOSIS — M779 Enthesopathy, unspecified: Secondary | ICD-10-CM

## 2017-08-01 NOTE — Progress Notes (Signed)
Subjective:  Patient ID: Diana Stout, female    DOB: [birth year],  MRN: [id number]  Chief Complaint  Patient presents with  . Foot Pain    Rt foot plantar forefoot pain at times, interdigital corn 3rd rt    84 y.o. female presents with the above complaint. Reports pain in the ball of the 3rd toe. Also complains of corn to the 3rd toe of the right foot. Denies other pedal issues.  Past Medical History:  Diagnosis Date  . Abdominal pain, lower 11/22/2010   2015 resolved off Zocor 5/17?urticaria w/GI upset: Dtr is allergic to seafood - the pt is on MegaRed krill oil - d/c MegaRed   . Abnormal chest CT 05/22/2011   Followed in Pulmonary clinic/ [facility]/ [place]  05/14/11 CT CHEST WITHOUT CONTRAST 1. Multiple small pulmonary nodules scattered throughout the lungs  bilaterally ranging in size from 3-6 mm.    2. There is a small hiatal hernia.  3. Atherosclerosis.  4. Status post cholecystectomy.  5. Low attenuation hepatic lesions unchanged compared to recent  abdominal CT scan, likely to represent cysts  . Acute pharyngitis 02/28/2007   Qualifier: Diagnosis of  By: [name] MD, [name]   . ANEMIA, NORMOCYTIC 08/10/2009   mild    . BRONCHITIS, ACUTE 06/12/2007   Qualifier: Diagnosis of  By: Plotnikov MD, [name] Canker sore 01/01/2017   9/18   x1  . CAP (community acquired pneumonia) 01/01/2017   9/18 probable L lung  . Chest pain, atypical 06/22/2014   06/22/14 ?etiology   . Cough 04/20/2007   Qualifier: Diagnosis of  By: Plotnikov MD, [name]   . Diarrhea 07/19/2013   4/15 x 1 mo ?etiol 8/18  . DIVERTICULOSIS, COLON 03/08/2007   Qualifier: Diagnosis of  By: [name] MD, [name]   . Dizziness 07/19/2013   Likely due to diarrhea/meds 4/15   . DVT (deep venous thrombosis) ([place]) 1998   hx of right  . DVT, HX OF 02/02/2007   Qualifier: Diagnosis of  By: [name]    . Dyslipidemia 02/02/2007   Chronic 1/13 worse    . Dyspnea 07/01/2008   2/13 improved w/reg exercise 1/17  deconditioning - CL stress test was nl    . Dysuria 05/18/2013   2/15, 1/17   . Edema 09/27/2009   Qualifier: Diagnosis of  By: [name], MD, [name]   . ESOPHAGEAL STRICTURE 03/06/2007   Qualifier: Diagnosis of  By: [name] MD, [name]   . Esophagitis 2006  . Essential hypertension 03/08/2007   Chronic  On Benicar   . Fatigue 10/15/2011   2015 resolved off Zocor   . FOOT PAIN 07/25/2009   R foot OA    . GERD 02/02/2007   Wedge pillow Protonix prn, Pepcid - d/c  . GLUCOSE INTOLERANCE 03/06/2007   Qualifier: Diagnosis of  By: [name] MD, [name]   . History of shingles   . Hyperlipidemia   . Hypoxemia 08/01/2009   Qualifier: Diagnosis of  By: Plotnikov MD, [name]   . IBS 03/06/2007   Qualifier: Diagnosis of  By: [name] MD, [name]   . Impaired glucose tolerance 11/22/2010  . Kidney cyst, acquired 07/22/2016   Remote   . Kidney cysts    left- Dr [name]  . Lung nodule 05/08/2011   04/15/11 RLL 4 mm nodule on abd CT - [facility] office   . Multiple lung nodules 2012   seen on CT scan by  Dr [name], pulmonologist  . Nausea alone 07/01/2008   2015 resolved off Zocor    . Obesity 01/03/2015  . Osteoporosis 02/02/2007   Chronic  Vit D, yoga  . Palpitations 05/12/2014   Dr [name] 2/16 poss due to steroid pack   . Pneumonia 12/2016  . Rash and nonspecific skin eruption 09/12/2015   5/17?urticaria Dtr is allergic to seafood - the pt is on MegaRed krill oil - d/c MegaRed   . Recurrent pneumonia 08/15/2009   Qualifier: Diagnosis of  By: Plotnikov MD, [name]   . Right-sided thoracic back pain 05/12/2014   2/16 MSK   . SHINGLES, HX OF 03/06/2007   Qualifier: Diagnosis of  By: [name] MD, [name]   . SINUSITIS- ACUTE-NOS 03/06/2007   Qualifier: Diagnosis of  By: [name] MD, [name]   . SKIN RASH 08/10/2009   Qualifier: Diagnosis of  By: Plotnikov MD, [name]   . Snoring 12/22/2013   Worse 9/15   . Upper airway cough syndrome 12/08/2014   Dr [name] Flutter valve added 01/02/2015 > resolved 02/03/2015  - rec  try off gerd rx p 03/09/15   . URI, acute 01/01/2011   9/12, 7/13, 1/14, 2/15, 9/16   . UTI 07/19/2008   Qualifier: Diagnosis of  By: Plotnikov MD, [name]   . Wheezing 12/08/2014   Past Surgical History:  Procedure Laterality Date  . APPENDECTOMY    . CHOLECYSTECTOMY    . HYSTEROSCOPY WITH RESECTOSCOPE  7/99   resect polyp  . TUBAL LIGATION Bilateral     Current Outpatient Medications:  .  aspirin 81 MG tablet, Take 81 mg by mouth daily.  , Disp: , Rfl:  .  cholecalciferol (VITAMIN D) 1000 UNITS tablet, Take 1,000 Units by mouth daily., Disp: , Rfl:  .  cyanocobalamin 1000 MCG tablet, Take 1,000 mcg by mouth daily., Disp: , Rfl:  .  famotidine (PEPCID) 20 MG tablet, Take 1 tablet (20 mg total) by mouth 2 (two) times daily., Disp: 60 tablet, Rfl: 0 .  fluticasone (FLONASE) 50 MCG/ACT nasal spray, Place 2 sprays into both nostrils daily., Disp: 16 g, Rfl: 5 .  Fluticasone-Salmeterol (ADVAIR DISKUS) 100-50 MCG/DOSE AEPB, Inhale 1 puff into the lungs 2 (two) times daily., Disp: 1 each, Rfl: 3 .  furosemide (LASIX) 20 MG tablet, Take 1 tablet (20 mg total) by mouth daily as needed., Disp: 30 tablet, Rfl: 3 .  olmesartan (BENICAR) 20 MG tablet, Take 1 tablet (20 mg total) by mouth daily. Patient needs office visit before refills will be given, Disp: 30 tablet, Rfl: 0 .  pantoprazole (PROTONIX) 40 MG tablet, Take 1 tablet (40 mg total) by mouth daily., Disp: 30 tablet, Rfl: 0 .  potassium chloride (KLOR-CON 10) 10 MEQ tablet, Take 1 tablet (10 mEq total) by mouth daily as needed. Take w/Furosemide, Disp: 30 tablet, Rfl: 3 .  Probiotic Product (ALIGN) 4 MG CAPS, Take 1 capsule (4 mg total) by mouth daily., Disp: 30 capsule, Rfl: 0 .  Respiratory Therapy Supplies (FLUTTER) DEVI, Use as directed, Disp: 1 each, Rfl: 0  Allergies  Allergen Reactions  . Atorvastatin Nausea And Vomiting    REACTION: Gi pain  . Alendronate Sodium Other (See Comments)    REACTION: esophagus problem  . Ceftriaxone  Sodium Rash    REACTION: rash  . Codeine Phosphate Other (See Comments)    REACTION: nightmares  . Moxifloxacin Palpitations    REACTION: nausea/chest pain She can take Cipro  . Simvastatin  Other (See Comments)    indigestion   Review of Systems: Negative except as noted in the HPI. Denies N/V/F/Ch. Objective:   Vitals:   07/18/17 1107  BP: 121/69  Pulse: 75   General AA&O x3. Normal mood and affect.  Vascular Dorsalis pedis and posterior tibial pulses  present 2+ bilaterally  Capillary refill normal to all digits. Pedal hair growth normal.  Neurologic Epicritic sensation grossly present.  Dermatologic No open lesions. Interspaces clear of maceration. Nails well groomed and normal in appearance.  Orthopedic: MMT 5/5 in dorsiflexion, plantarflexion, inversion, and eversion. Normal joint ROM without pain or crepitus. POP R 3rd MPJ capsule   Assessment & Plan:  Patient was evaluated and treated and all questions answered.  Capsulitis -XR taken and reviewed. No fractures or dislocations. Abnormal met parabola -Injection delivered R 3rd MPJ capsule. -Discussed proper shoe gear.  Procedure: Joint Injection Location: Right 3rd MPJ joint Skin Prep: Alcohol. Injectate: 0.5 cc 1% lidocaine plain, 0.5 cc dexamethasone phosphate. Disposition: Patient tolerated procedure well. Injection site dressed with a band-aid.   -  Return in about 3 weeks (around 08/08/2017).

## 2017-08-01 NOTE — Progress Notes (Signed)
  Subjective:  Patient ID: Diana Stout, female    DOB: 1930/07/14,  MRN: [id number]  Chief Complaint  Patient presents with  . Foot Pain    capsulitis is much better   84 y.o. female returns for the above complaint. States the injection in the joint behind her 3rd toe helped with her pain. Denies pain today, states she only has occasional pain in the toe.  Objective:  There were no vitals filed for this visit. General AA&O x3. Normal mood and affect.  Vascular Pedal pulses palpable.  Neurologic Epicritic sensation grossly intact.  Dermatologic No open lesions. Skin normal texture and turgor.  Orthopedic: Slight pain to palpation R 3rd MPJ.   Assessment & Plan:  Patient was evaluated and treated and all questions answered.  Capsulitis, R 3rd MPJ -Only slight pain today. States it is not noticeable during normal activity. -Advised to return PRN for injections. -Discussed good supporting shoegear.  No follow-ups on file.

## 2017-10-07 IMAGING — NM NM MISC PROCEDURE
6 series · 36 of 36 positions shown · non-contrast
Comparison: none

[Series 1: rest · 6.51mm/px · 6 of 64 frames shown]
[frame 6/64]
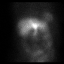
[frame 16/64]
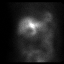
[frame 27/64]
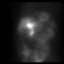
[frame 38/64]
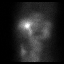
[frame 48/64]
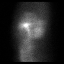
[frame 59/64]
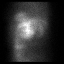

[Series 1: wbr_r-proj_st rest · 6.51mm/px · 6 of 64 frames shown]
[frame 6/64]
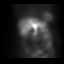
[frame 16/64]
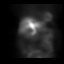
[frame 27/64]
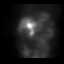
[frame 38/64]
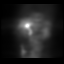
[frame 48/64]
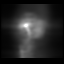
[frame 59/64]
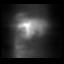

[Series 2: wbr_s-proj_st stress · 6.51mm/px · 6 of 64 frames shown (1 of 2)]
[frame 6/64]
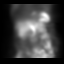
[frame 16/64]
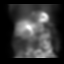
[frame 27/64]
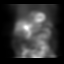
[frame 38/64]
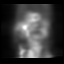
[frame 48/64]
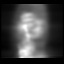
[frame 59/64]
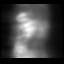

[Series 2: stress · 6.51mm/px · 6 of 64 frames shown (1 of 2)]
[frame 6/64]
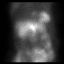
[frame 16/64]
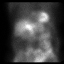
[frame 27/64]
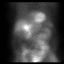
[frame 38/64]
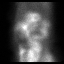
[frame 48/64]
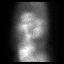
[frame 59/64]
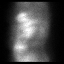

[Series 2: stress · 6.51mm/px · 6 of 505 frames shown (2 of 2)]
[frame 43/505  full-range]
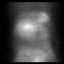
[frame 127/505  full-range]
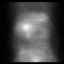
[frame 211/505  full-range]
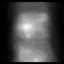
[frame 295/505  full-range]
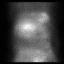
[frame 379/505  full-range]
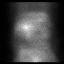
[frame 463/505  full-range]
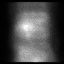

[Series 2: wbr_s-proj_st stress · 6.51mm/px · 6 of 512 frames shown (2 of 2)]
[frame 43/512]
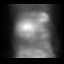
[frame 128/512]
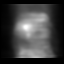
[frame 214/512]
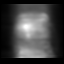
[frame 299/512]
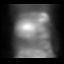
[frame 384/512]
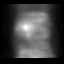
[frame 470/512]
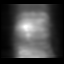

[36 of 36 positions shown; findings below may reference images not displayed]

Canned report from images found in remote index.

Refer to host system for actual result text.

## 2017-10-18 DIAGNOSIS — J22 Unspecified acute lower respiratory infection: Secondary | ICD-10-CM | POA: Diagnosis not present

## 2017-10-18 DIAGNOSIS — B9689 Other specified bacterial agents as the cause of diseases classified elsewhere: Secondary | ICD-10-CM | POA: Diagnosis not present

## 2017-10-18 DIAGNOSIS — J019 Acute sinusitis, unspecified: Secondary | ICD-10-CM | POA: Diagnosis not present

## 2017-10-22 ENCOUNTER — Ambulatory Visit (INDEPENDENT_AMBULATORY_CARE_PROVIDER_SITE_OTHER): Payer: Medicare Other | Admitting: Primary Care

## 2017-10-22 ENCOUNTER — Ambulatory Visit (INDEPENDENT_AMBULATORY_CARE_PROVIDER_SITE_OTHER)
Admission: RE | Admit: 2017-10-22 | Discharge: 2017-10-22 | Disposition: A | Discharge status: Home / Self Care | Payer: Medicare Other | Source: Ambulatory Visit | Attending: Primary Care | Admitting: Primary Care

## 2017-10-22 ENCOUNTER — Encounter: Payer: Self-pay | Admitting: Primary Care

## 2017-10-22 ENCOUNTER — Other Ambulatory Visit: Payer: Self-pay | Admitting: Primary Care

## 2017-10-22 DIAGNOSIS — R05 Cough: Secondary | ICD-10-CM | POA: Diagnosis not present

## 2017-10-22 DIAGNOSIS — J40 Bronchitis, not specified as acute or chronic: Secondary | ICD-10-CM | POA: Diagnosis not present

## 2017-10-22 DIAGNOSIS — R0602 Shortness of breath: Secondary | ICD-10-CM

## 2017-10-22 DIAGNOSIS — R059 Cough, unspecified: Secondary | ICD-10-CM

## 2017-10-22 MED ORDER — FLUTICASONE-SALMETEROL 100-50 MCG/DOSE IN AEPB: 1.0000 | INHALATION_SPRAY | Freq: Two times a day (BID) | RESPIRATORY_TRACT | 3 refills | Status: DC

## 2017-10-22 MED ORDER — PREDNISONE 10 MG PO TABS: ORAL_TABLET | ORAL | 0 refills | Status: DC

## 2017-10-22 NOTE — Patient Instructions (Signed)
Continue taking Augmentin (amoxicillin/clavulanate) until complete Sending a small prednisone course to pharmacy Refill Advair, take as needed for shortness of breath/wheeze Mucinex twice a day for congested cough Continue flutter valve  Chest xray today

## 2017-10-22 NOTE — Assessment & Plan Note (Addendum)
-   Symptoms consistent with acute bronchitis, started on Augmentin at [facility] on 7/13  - Associated SOB, sending small prednisone course (complains of insomnia with high doses) - Will check CXR r/o PNA  - Continue flutter valve and add mucinex - Refill Advair - FU as needed

## 2017-10-22 NOTE — Progress Notes (Signed)
@Patient  ID: Diana Stout, female    DOB: 02-Oct-1930, 84 y.o.   MRN: [id number]  Chief Complaint  Patient presents with  . Acute Visit    SOB, having trouble deep breathing,Coughing green mucus coming up. On ABX and had pred shot.    Referring provider: Plotnikov, [name], MD  HPI: 84 year old female, never smoked. Hx recurrent PNA requiring hospitalizations. Patient of Dr. Elsworth Soho, last seen 05/2017. Asked to call office at the first sign of bronchitis or sputum color.  10/22/2017 Presents today with complaints of shortness of breath. She was seen in UC on Saturday for cough and sinus congestion. Symptoms started on 7/10. Cough is thick, with green coloring. She was started on Augmentin and received medrol shot. She feels better but has some associated sob. Cough and head congestion have improved. Denies fever, sinus pain, wheeze, chest pain. She has been using flutter valve. Advair is only for prn use, needs refill.    Allergies  Allergen Reactions  . Atorvastatin Nausea And Vomiting    REACTION: Gi pain  . Alendronate Sodium Other (See Comments)    REACTION: esophagus problem  . Ceftriaxone Sodium Rash    REACTION: rash  . Codeine Phosphate Other (See Comments)    REACTION: nightmares  . Moxifloxacin Palpitations    REACTION: nausea/chest pain She can take Cipro  . Simvastatin Other (See Comments)    indigestion    Immunization History  Administered Date(s) Administered  . Influenza Split 12/25/2011  . Influenza Whole 02/17/2007, 01/17/2010, 01/07/2011, 01/07/2016  . Influenza, High Dose Seasonal PF 01/10/2017  . Influenza,inj,Quad PF,6+ Mos 02/03/2015  . Influenza-Unspecified 02/04/2013, 01/06/2014, 03/23/2014  . Pneumococcal Conjugate-13 03/09/2013  . Pneumococcal Polysaccharide-23 02/18/2005, 07/22/2016  . Td 06/20/2003, 12/22/2013  . Tdap 08/25/2015  . Zoster 02/02/2007    Past Medical History:  Diagnosis Date  . Abdominal pain, lower 11/22/2010   2015  resolved off Zocor 5/17?urticaria w/GI upset: Dtr is allergic to seafood - the pt is on MegaRed krill oil - d/c MegaRed   . Abnormal chest CT 05/22/2011   Followed in Pulmonary clinic/ [facility]/ [place]  05/14/11 CT CHEST WITHOUT CONTRAST 1. Multiple small pulmonary nodules scattered throughout the lungs  bilaterally ranging in size from 3-6 mm.    2. There is a small hiatal hernia.  3. Atherosclerosis.  4. Status post cholecystectomy.  5. Low attenuation hepatic lesions unchanged compared to recent  abdominal CT scan, likely to represent cysts  . Acute pharyngitis 02/28/2007   Qualifier: Diagnosis of  By: [name] MD, [name]   . ANEMIA, NORMOCYTIC 08/10/2009   mild    . BRONCHITIS, ACUTE 06/12/2007   Qualifier: Diagnosis of  By: Plotnikov MD, [name] Canker sore 01/01/2017   9/18   x1  . CAP (community acquired pneumonia) 01/01/2017   9/18 probable L lung  . Chest pain, atypical 06/22/2014   06/22/14 ?etiology   . Cough 04/20/2007   Qualifier: Diagnosis of  By: Plotnikov MD, [name]   . Diarrhea 07/19/2013   4/15 x 1 mo ?etiol 8/18  . DIVERTICULOSIS, COLON 03/08/2007   Qualifier: Diagnosis of  By: [name] MD, [name]   . Dizziness 07/19/2013   Likely due to diarrhea/meds 4/15   . DVT (deep venous thrombosis) ([place]) 1998   hx of right  . DVT, HX OF 02/02/2007   Qualifier: Diagnosis of  By: [name]    . Dyslipidemia 02/02/2007   Chronic 1/13 worse    .  Dyspnea 07/01/2008   2/13 improved w/reg exercise 1/17 deconditioning - CL stress test was nl    . Dysuria 05/18/2013   2/15, 1/17   . Edema 09/27/2009   Qualifier: Diagnosis of  By: [name], MD, [name]   . ESOPHAGEAL STRICTURE 03/06/2007   Qualifier: Diagnosis of  By: [name] MD, [name]   . Esophagitis 2006  . Essential hypertension 03/08/2007   Chronic  On Benicar   . Fatigue 10/15/2011   2015 resolved off Zocor   . FOOT PAIN 07/25/2009   R foot OA    . GERD 02/02/2007   Wedge pillow Protonix prn, Pepcid - d/c  .  GLUCOSE INTOLERANCE 03/06/2007   Qualifier: Diagnosis of  By: [name] MD, [name]   . History of shingles   . Hyperlipidemia   . Hypoxemia 08/01/2009   Qualifier: Diagnosis of  By: Plotnikov MD, [name]   . IBS 03/06/2007   Qualifier: Diagnosis of  By: [name] MD, [name]   . Impaired glucose tolerance 11/22/2010  . Kidney cyst, acquired 07/22/2016   Remote   . Kidney cysts    left- Dr [name]  . Lung nodule 05/08/2011   04/15/11 RLL 4 mm nodule on abd CT - [facility] office   . Multiple lung nodules 2012   seen on CT scan by Dr [name], pulmonologist  . Nausea alone 07/01/2008   2015 resolved off Zocor    . Obesity 01/03/2015  . Osteoporosis 02/02/2007   Chronic  Vit D, yoga  . Palpitations 05/12/2014   Dr [name] 2/16 poss due to steroid pack   . Pneumonia 12/2016  . Rash and nonspecific skin eruption 09/12/2015   5/17?urticaria Dtr is allergic to seafood - the pt is on MegaRed krill oil - d/c MegaRed   . Recurrent pneumonia 08/15/2009   Qualifier: Diagnosis of  By: Plotnikov MD, [name]   . Right-sided thoracic back pain 05/12/2014   2/16 MSK   . SHINGLES, HX OF 03/06/2007   Qualifier: Diagnosis of  By: [name] MD, [name]   . SINUSITIS- ACUTE-NOS 03/06/2007   Qualifier: Diagnosis of  By: [name] MD, [name]   . SKIN RASH 08/10/2009   Qualifier: Diagnosis of  By: Plotnikov MD, [name]   . Snoring 12/22/2013   Worse 9/15   . Upper airway cough syndrome 12/08/2014   Dr [name] Flutter valve added 01/02/2015 > resolved 02/03/2015  - rec try off gerd rx p 03/09/15   . URI, acute 01/01/2011   9/12, 7/13, 1/14, 2/15, 9/16   . UTI 07/19/2008   Qualifier: Diagnosis of  By: Plotnikov MD, [name]   . Wheezing 12/08/2014    Tobacco History: Social History   Tobacco Use  Smoking Status Never Smoker  Smokeless Tobacco Never Used   Counseling given: Not Answered   Outpatient Medications Prior to Visit  Medication Sig Dispense Refill  . aspirin 81 MG tablet Take 81 mg by mouth daily.      . cholecalciferol  (VITAMIN D) 1000 UNITS tablet Take 1,000 Units by mouth daily.    . cyanocobalamin 1000 MCG tablet Take 1,000 mcg by mouth daily.    . famotidine (PEPCID) 20 MG tablet Take 1 tablet (20 mg total) by mouth 2 (two) times daily. 60 tablet 0  . fluticasone (FLONASE) 50 MCG/ACT nasal spray Place 2 sprays into both nostrils daily. 16 g 5  . furosemide (LASIX) 20 MG tablet Take 1 tablet (20 mg total) by  mouth daily as needed. 30 tablet 3  . olmesartan (BENICAR) 20 MG tablet Take 1 tablet (20 mg total) by mouth daily. Patient needs office visit before refills will be given 30 tablet 0  . pantoprazole (PROTONIX) 40 MG tablet Take 1 tablet (40 mg total) by mouth daily. 30 tablet 0  . potassium chloride (KLOR-CON 10) 10 MEQ tablet Take 1 tablet (10 mEq total) by mouth daily as needed. Take w/Furosemide 30 tablet 3  . Probiotic Product (ALIGN) 4 MG CAPS Take 1 capsule (4 mg total) by mouth daily. 30 capsule 0  . Respiratory Therapy Supplies (FLUTTER) DEVI Use as directed 1 each 0  . Fluticasone-Salmeterol (ADVAIR DISKUS) 100-50 MCG/DOSE AEPB Inhale 1 puff into the lungs 2 (two) times daily. 1 each 3  . amoxicillin-clavulanate (AUGMENTIN) 875-125 MG tablet TK 1 T PO BID  0   No facility-administered medications prior to visit.       Review of Systems  Review of Systems  Constitutional: Negative.   HENT: Negative.   Respiratory: Positive for cough and shortness of breath. Negative for wheezing.   Cardiovascular: Negative.   Allergic/Immunologic: Negative.     Physical Exam  BP 122/80 (BP Location: Left Arm, Cuff Size: Normal)   Pulse 82   Temp (!) 97 F (36.1 C) (Oral)   Ht 4\' 11"  (1.499 m)   Wt 170 lb (77.1 kg)   LMP 04/08/1996   SpO2 95%   BMI 34.34 kg/m  Physical Exam  Constitutional: She is oriented to person, place, and time. She appears well-developed and well-nourished.  HENT:  Head: Normocephalic and atraumatic.  Eyes: Pupils are equal, round, and reactive to light. EOM are  normal.  Neck: Normal range of motion. Neck supple.  Cardiovascular: Normal rate, regular rhythm and normal heart sounds.  No murmur heard. Pulmonary/Chest: Effort normal and breath sounds normal. No respiratory distress. She has no wheezes.  Abdominal: Soft. Bowel sounds are normal. There is no tenderness.  Neurological: She is alert and oriented to person, place, and time.  Skin: Skin is warm and dry. No rash noted. No erythema.  Psychiatric: She has a normal mood and affect. Her behavior is normal. Judgment normal.     Lab Results:  CBC    Component Value Date/Time   WBC 4.4 07/15/2016 1109   RBC 4.37 07/15/2016 1109   HGB 12.3 07/15/2016 1109   HCT 37.3 07/15/2016 1109   PLT 184.0 07/15/2016 1109   MCV 85.5 07/15/2016 1109   MCH 26.7 09/02/2015 1630   MCHC 32.8 07/15/2016 1109   RDW 14.6 07/15/2016 1109   LYMPHSABS 2.2 07/23/2015 0056   MONOABS 0.4 07/23/2015 0056   EOSABS 0.2 07/23/2015 0056   BASOSABS 0.0 07/23/2015 0056    BMET    Component Value Date/Time   NA 139 12/02/2016 1449   K 4.0 12/02/2016 1449   CL 103 12/02/2016 1449   CO2 30 12/02/2016 1449   GLUCOSE 103 (H) 12/02/2016 1449   BUN 18 12/02/2016 1449   CREATININE 0.80 12/02/2016 1449   CREATININE 0.74 09/29/2012 1429   CALCIUM 9.5 12/02/2016 1449   GFRNONAA 60 (L) 09/02/2015 1630   GFRAA >60 09/02/2015 1630    BNP No results found for: BNP  ProBNP No results found for: PROBNP  Imaging: No results found.   Assessment & Plan:   Bronchitis - Symptoms consistent with acute bronchitis, started on Augmentin at UC on 7/13  - Associated SOB, sending small prednisone course (complains of insomnia  with high doses) - Will check CXR r/o PNA  - Continue flutter valve and add mucinex - Refill Advair - FU as needed     Martyn Ehrich, NP 10/22/2017

## 2017-10-23 ENCOUNTER — Ambulatory Visit: Payer: Medicare Other | Admitting: Pulmonary Disease

## 2017-10-31 ENCOUNTER — Telehealth: Payer: Self-pay | Admitting: Internal Medicine

## 2017-10-31 DIAGNOSIS — E785 Hyperlipidemia, unspecified: Secondary | ICD-10-CM

## 2017-10-31 DIAGNOSIS — I10 Essential (primary) hypertension: Secondary | ICD-10-CM

## 2017-10-31 NOTE — Telephone Encounter (Signed)
Copied from [place] [id number]. Topic: Quick Communication - See Telephone Encounter >> Oct 31, 2017  4:39 PM Rutherford Nail, NT wrote: CRM for notification. See Telephone encounter for: 10/31/17. Patient calling to schedule an appointment with Dr Dennison Nancy for 11/05/17 and would like to know if she can get labs done on Monday 11/03/17. States that the labs are for a medical issue. Please advise.

## 2017-11-03 NOTE — Telephone Encounter (Signed)
Patient called back and wanted to come in for labs this morning, patient states she doesn't know what kind of labs she needs but knows she needs some. I advised patient she will need to see the doctor before labs can be ordered. Patient is coming in on Wednesday 7/31

## 2017-11-04 ENCOUNTER — Other Ambulatory Visit (INDEPENDENT_AMBULATORY_CARE_PROVIDER_SITE_OTHER): Payer: Medicare Other

## 2017-11-04 DIAGNOSIS — E785 Hyperlipidemia, unspecified: Secondary | ICD-10-CM | POA: Diagnosis not present

## 2017-11-04 DIAGNOSIS — I10 Essential (primary) hypertension: Secondary | ICD-10-CM | POA: Diagnosis not present

## 2017-11-04 LAB — URINALYSIS, ROUTINE W REFLEX MICROSCOPIC
BILIRUBIN URINE: NEGATIVE
HGB URINE DIPSTICK: NEGATIVE
KETONES UR: NEGATIVE
NITRITE: NEGATIVE
Specific Gravity, Urine: 1.025 (ref 1.000–1.030)
TOTAL PROTEIN, URINE-UPE24: NEGATIVE
URINE GLUCOSE: NEGATIVE
UROBILINOGEN UA: 0.2 (ref 0.0–1.0)
pH: 5.5 (ref 5.0–8.0)

## 2017-11-04 LAB — LIPID PANEL
CHOL/HDL RATIO: 4
CHOLESTEROL: 218 mg/dL — AB (ref 0–200)
HDL: 51.8 mg/dL (ref >39.00)
LDL Cholesterol: 148 mg/dL — ABNORMAL HIGH (ref 0–99)
NonHDL: 165.73
TRIGLYCERIDES: 88 mg/dL (ref 0.0–149.0)
VLDL: 17.6 mg/dL (ref 0.0–40.0)

## 2017-11-04 LAB — CBC WITH DIFFERENTIAL/PLATELET
BASOS PCT: 0.5 % (ref 0.0–3.0)
Basophils Absolute: 0 10*3/uL (ref 0.0–0.1)
EOS ABS: 0.2 10*3/uL (ref 0.0–0.7)
EOS PCT: 3.1 % (ref 0.0–5.0)
HEMATOCRIT: 37.1 % (ref 36.0–46.0)
HEMOGLOBIN: 12.3 g/dL (ref 12.0–15.0)
LYMPHS PCT: 26 % (ref 12.0–46.0)
Lymphs Abs: 1.4 10*3/uL (ref 0.7–4.0)
MCHC: 33 g/dL (ref 30.0–36.0)
MCV: 86.4 fl (ref 78.0–100.0)
Monocytes Absolute: 0.5 10*3/uL (ref 0.1–1.0)
Monocytes Relative: 9.3 % (ref 3.0–12.0)
NEUTROS ABS: 3.4 10*3/uL (ref 1.4–7.7)
Neutrophils Relative %: 61.1 % (ref 43.0–77.0)
Platelets: 157 10*3/uL (ref 150.0–400.0)
RBC: 4.3 Mil/uL (ref 3.87–5.11)
RDW: 15.1 % (ref 11.5–15.5)
WBC: 5.5 10*3/uL (ref 4.0–10.5)

## 2017-11-04 LAB — BASIC METABOLIC PANEL
BUN: 15 mg/dL (ref 6–23)
CHLORIDE: 105 meq/L (ref 96–112)
CO2: 29 meq/L (ref 19–32)
CREATININE: 0.81 mg/dL (ref 0.40–1.20)
Calcium: 9.2 mg/dL (ref 8.4–10.5)
GFR: 71.48 mL/min (ref >60.00)
GLUCOSE: 109 mg/dL — AB (ref 70–99)
POTASSIUM: 4.4 meq/L (ref 3.5–5.1)
Sodium: 141 mEq/L (ref 135–145)

## 2017-11-04 LAB — HEPATIC FUNCTION PANEL
ALBUMIN: 3.8 g/dL (ref 3.5–5.2)
ALT: 12 U/L (ref 0–35)
AST: 14 U/L (ref 0–37)
Alkaline Phosphatase: 76 U/L (ref 39–117)
Bilirubin, Direct: 0.1 mg/dL (ref 0.0–0.3)
Total Bilirubin: 0.8 mg/dL (ref 0.2–1.2)
Total Protein: 7 g/dL (ref 6.0–8.3)

## 2017-11-04 LAB — TSH: TSH: 0.31 u[IU]/mL — AB (ref 0.35–4.50)

## 2017-11-04 NOTE — Telephone Encounter (Signed)
Orders entered, pt notified

## 2017-11-05 ENCOUNTER — Other Ambulatory Visit: Payer: Self-pay

## 2017-11-05 ENCOUNTER — Ambulatory Visit (INDEPENDENT_AMBULATORY_CARE_PROVIDER_SITE_OTHER): Payer: Medicare Other | Admitting: Internal Medicine

## 2017-11-05 ENCOUNTER — Encounter: Payer: Self-pay | Admitting: Internal Medicine

## 2017-11-05 ENCOUNTER — Other Ambulatory Visit (INDEPENDENT_AMBULATORY_CARE_PROVIDER_SITE_OTHER): Payer: Medicare Other

## 2017-11-05 VITALS — BP 134/82 | HR 72 | Temp 98.4°F | Ht 59.0 in | Wt 165.0 lb

## 2017-11-05 DIAGNOSIS — R42 Dizziness and giddiness: Secondary | ICD-10-CM | POA: Diagnosis not present

## 2017-11-05 DIAGNOSIS — J40 Bronchitis, not specified as acute or chronic: Secondary | ICD-10-CM

## 2017-11-05 DIAGNOSIS — R300 Dysuria: Secondary | ICD-10-CM | POA: Diagnosis not present

## 2017-11-05 DIAGNOSIS — I10 Essential (primary) hypertension: Secondary | ICD-10-CM

## 2017-11-05 DIAGNOSIS — R8290 Unspecified abnormal findings in urine: Secondary | ICD-10-CM

## 2017-11-05 DIAGNOSIS — E785 Hyperlipidemia, unspecified: Secondary | ICD-10-CM

## 2017-11-05 DIAGNOSIS — R0600 Dyspnea, unspecified: Secondary | ICD-10-CM | POA: Diagnosis not present

## 2017-11-05 LAB — URINALYSIS, ROUTINE W REFLEX MICROSCOPIC
Bilirubin Urine: NEGATIVE
Hgb urine dipstick: NEGATIVE
Ketones, ur: NEGATIVE
Nitrite: NEGATIVE
PH: 5 (ref 5.0–8.0)
Specific Gravity, Urine: 1.025 (ref 1.000–1.030)
Total Protein, Urine: NEGATIVE
Urine Glucose: NEGATIVE
Urobilinogen, UA: 0.2 (ref 0.0–1.0)

## 2017-11-05 LAB — T4, FREE: FREE T4: 1.07 ng/dL (ref 0.60–1.60)

## 2017-11-05 LAB — TSH: TSH: 0.42 u[IU]/mL (ref 0.35–4.50)

## 2017-11-05 MED ORDER — OLMESARTAN MEDOXOMIL 20 MG PO TABS: 20.0000 mg | ORAL_TABLET | Freq: Every day | ORAL | 0 refills | Status: DC

## 2017-11-05 MED ORDER — POTASSIUM CHLORIDE ER 10 MEQ PO TBCR: 10.0000 meq | EXTENDED_RELEASE_TABLET | Freq: Every day | ORAL | 3 refills | Status: DC | PRN

## 2017-11-05 MED ORDER — FUROSEMIDE 20 MG PO TABS: 20.0000 mg | ORAL_TABLET | Freq: Every day | ORAL | 3 refills | Status: DC | PRN

## 2017-11-05 NOTE — Assessment & Plan Note (Signed)
On Benicar 10 mg a day - hold due to lightheadedness

## 2017-11-05 NOTE — Assessment & Plan Note (Signed)
Advair 

## 2017-11-05 NOTE — Assessment & Plan Note (Signed)
No sx's Abn UA UA and Cx

## 2017-11-05 NOTE — Progress Notes (Signed)
Subjective:  Patient ID: Diana Stout, female    DOB: Aug 18, 1930  Age: 84 y.o. MRN: [id number]  CC: No chief complaint on file.   HPI Diana Stout presents for dizziness, lightheaded, no "spinning" sensation. She just had a URI treated w/Augmentin, Residual cough. She was sick 2 weeks ago. Night sweats.Marland KitchenMarland KitchenWas on Benicar 10 mg/d  Outpatient Medications Prior to Visit  Medication Sig Dispense Refill  . amoxicillin-clavulanate (AUGMENTIN) 875-125 MG tablet TK 1 T PO BID  0  . aspirin 81 MG tablet Take 81 mg by mouth daily.      . cholecalciferol (VITAMIN D) 1000 UNITS tablet Take 1,000 Units by mouth daily.    . cyanocobalamin 1000 MCG tablet Take 1,000 mcg by mouth daily.    . famotidine (PEPCID) 20 MG tablet Take 1 tablet (20 mg total) by mouth 2 (two) times daily. 60 tablet 0  . fluticasone (FLONASE) 50 MCG/ACT nasal spray Place 2 sprays into both nostrils daily. 16 g 5  . Fluticasone-Salmeterol (ADVAIR DISKUS) 100-50 MCG/DOSE AEPB Inhale 1 puff into the lungs 2 (two) times daily. 1 each 3  . furosemide (LASIX) 20 MG tablet Take 1 tablet (20 mg total) by mouth daily as needed. 30 tablet 3  . olmesartan (BENICAR) 20 MG tablet Take 1 tablet (20 mg total) by mouth daily. Patient needs office visit before refills will be given 30 tablet 0  . pantoprazole (PROTONIX) 40 MG tablet Take 1 tablet (40 mg total) by mouth daily. 30 tablet 0  . potassium chloride (KLOR-CON 10) 10 MEQ tablet Take 1 tablet (10 mEq total) by mouth daily as needed. Take w/Furosemide 30 tablet 3  . predniSONE (DELTASONE) 10 MG tablet Take 2 tabs with breakfast for 5 days 10 tablet 0  . Probiotic Product (ALIGN) 4 MG CAPS Take 1 capsule (4 mg total) by mouth daily. 30 capsule 0  . Respiratory Therapy Supplies (FLUTTER) DEVI Use as directed 1 each 0   No facility-administered medications prior to visit.     ROS: Review of Systems  Constitutional: Positive for fatigue. Negative for activity change, appetite change,  chills and unexpected weight change.  HENT: Negative for congestion, mouth sores and sinus pressure.   Eyes: Negative for visual disturbance.  Respiratory: Negative for cough and chest tightness.   Gastrointestinal: Negative for abdominal pain and nausea.  Genitourinary: Negative for difficulty urinating, frequency and vaginal pain.  Musculoskeletal: Positive for arthralgias. Negative for back pain and gait problem.  Skin: Negative for pallor and rash.  Neurological: Positive for dizziness and light-headedness. Negative for tremors, weakness, numbness and headaches.  Psychiatric/Behavioral: Negative for confusion, sleep disturbance and suicidal ideas.    Objective:  BP 134/82 (BP Location: Left Arm, Patient Position: Sitting, Cuff Size: Normal)   Pulse 72   Temp 98.4 F (36.9 C) (Oral)   Ht 4\' 11"  (1.499 m)   Wt 165 lb (74.8 kg)   LMP 04/08/1996   SpO2 98%   BMI 33.33 kg/m   BP Readings from Last 3 Encounters:  11/05/17 134/82  10/22/17 122/80  07/18/17 121/69    Wt Readings from Last 3 Encounters:  11/05/17 165 lb (74.8 kg)  10/22/17 170 lb (77.1 kg)  06/03/17 169 lb (76.7 kg)   BP laying 120/80 Standing BP 110/76   Physical Exam  Constitutional: She appears well-developed. No distress.  HENT:  Head: Normocephalic.  Right Ear: External ear normal.  Left Ear: External ear normal.  Nose: Nose normal.  Mouth/Throat: Oropharynx  is clear and moist.  Eyes: Pupils are equal, round, and reactive to light. Conjunctivae are normal. Right eye exhibits no discharge. Left eye exhibits no discharge.  Neck: Normal range of motion. Neck supple. No JVD present. No tracheal deviation present. No thyromegaly present.  Cardiovascular: Normal rate, regular rhythm and normal heart sounds.  Pulmonary/Chest: No stridor. No respiratory distress. She has no wheezes.  Abdominal: Soft. Bowel sounds are normal. She exhibits no distension and no mass. There is no tenderness. There is no rebound  and no guarding.  Musculoskeletal: She exhibits no edema or tenderness.  Lymphadenopathy:    She has no cervical adenopathy.  Neurological: She displays normal reflexes. No cranial nerve deficit. She exhibits normal muscle tone. Coordination normal.  Skin: No rash noted. No erythema.  Psychiatric: She has a normal mood and affect. Her behavior is normal. Judgment and thought content normal.  mild wheezing  Lab Results  Component Value Date   WBC 5.5 11/04/2017   HGB 12.3 11/04/2017   HCT 37.1 11/04/2017   PLT 157.0 11/04/2017   GLUCOSE 109 (H) 11/04/2017   CHOL 218 (H) 11/04/2017   TRIG 88.0 11/04/2017   HDL 51.80 11/04/2017   LDLDIRECT 154.9 05/01/2011   LDLCALC 148 (H) 11/04/2017   ALT 12 11/04/2017   AST 14 11/04/2017   NA 141 11/04/2017   K 4.4 11/04/2017   CL 105 11/04/2017   CREATININE 0.81 11/04/2017   BUN 15 11/04/2017   CO2 29 11/04/2017   TSH 0.31 (L) 11/04/2017   HGBA1C 5.9 11/20/2016    Dg Chest 2 View  Result Date: 10/22/2017 CLINICAL DATA:  84 year old female with shortness of breath for 1-2 weeks, cough and congestion. EXAM: CHEST - 2 VIEW COMPARISON:  High-resolution chest CT 01/24/2017. Chest radiographs 01/01/2017, and earlier. FINDINGS: Stable lung volumes and mediastinal contours. No pneumothorax, pulmonary edema, pleural effusion or confluent pulmonary opacity. Mild chronic increased interstitial markings appear stable. Stable visualized osseous structures. Negative visible bowel gas pattern. Stable cholecystectomy clips. IMPRESSION: No acute cardiopulmonary abnormality. Electronically Signed   By: Genevie Ann M.D.   On: 10/22/2017 11:21    Assessment & Plan:   There are no diagnoses linked to this encounter.   No orders of the defined types were placed in this encounter.    Follow-up: No follow-ups on file.  Walker Kehr, MD

## 2017-11-05 NOTE — Patient Instructions (Signed)
Do not take OLMESARTAN Use ADVAIR for 2-3 weeks

## 2017-11-05 NOTE — Assessment & Plan Note (Signed)
BP drop - hold Benicar 2019

## 2017-11-05 NOTE — Assessment & Plan Note (Signed)
Use Advair x 2-3 weeks

## 2017-11-09 LAB — CULTURE, URINE COMPREHENSIVE
MICRO NUMBER:: 90905345
SPECIMEN QUALITY:: ADEQUATE

## 2017-11-19 ENCOUNTER — Ambulatory Visit: Payer: Medicare Other | Admitting: Internal Medicine

## 2017-11-20 DIAGNOSIS — Z1231 Encounter for screening mammogram for malignant neoplasm of breast: Secondary | ICD-10-CM | POA: Diagnosis not present

## 2017-11-20 DIAGNOSIS — Z803 Family history of malignant neoplasm of breast: Secondary | ICD-10-CM | POA: Diagnosis not present

## 2017-11-24 ENCOUNTER — Encounter: Payer: Self-pay | Admitting: Internal Medicine

## 2017-11-24 ENCOUNTER — Ambulatory Visit (INDEPENDENT_AMBULATORY_CARE_PROVIDER_SITE_OTHER): Payer: Medicare Other | Admitting: Internal Medicine

## 2017-11-24 DIAGNOSIS — R42 Dizziness and giddiness: Secondary | ICD-10-CM | POA: Diagnosis not present

## 2017-11-24 DIAGNOSIS — I10 Essential (primary) hypertension: Secondary | ICD-10-CM | POA: Diagnosis not present

## 2017-11-24 DIAGNOSIS — R8290 Unspecified abnormal findings in urine: Secondary | ICD-10-CM

## 2017-11-24 DIAGNOSIS — R609 Edema, unspecified: Secondary | ICD-10-CM

## 2017-11-24 MED ORDER — FUROSEMIDE 20 MG PO TABS: 20.0000 mg | ORAL_TABLET | Freq: Every day | ORAL | 3 refills | Status: DC | PRN

## 2017-11-24 MED ORDER — POTASSIUM CHLORIDE ER 10 MEQ PO TBCR: 10.0000 meq | EXTENDED_RELEASE_TABLET | Freq: Every day | ORAL | 3 refills | Status: DC | PRN

## 2017-11-24 MED ORDER — OLMESARTAN MEDOXOMIL 20 MG PO TABS: 10.0000 mg | ORAL_TABLET | Freq: Every day | ORAL | 1 refills | Status: DC

## 2017-11-24 NOTE — Assessment & Plan Note (Signed)
Lsix, Kdur prn

## 2017-11-24 NOTE — Assessment & Plan Note (Signed)
Resolved

## 2017-11-24 NOTE — Assessment & Plan Note (Signed)
Re-start Benicar if BP elevated >150

## 2017-11-24 NOTE — Progress Notes (Signed)
Subjective:  Patient ID: Diana Stout, female    DOB: 1931/03/01  Age: 84 y.o. MRN: [id number]  CC: No chief complaint on file.   HPI Diana Stout presents for HTN, diziness - resolved off Benicar F/u abn UA. F/u cough - resolved  Outpatient Medications Prior to Visit  Medication Sig Dispense Refill  . aspirin 81 MG tablet Take 81 mg by mouth daily.      . cholecalciferol (VITAMIN D) 1000 UNITS tablet Take 1,000 Units by mouth daily.    . cyanocobalamin 1000 MCG tablet Take 1,000 mcg by mouth daily.    . famotidine (PEPCID) 20 MG tablet Take 1 tablet (20 mg total) by mouth 2 (two) times daily. 60 tablet 0  . fluticasone (FLONASE) 50 MCG/ACT nasal spray Place 2 sprays into both nostrils daily. 16 g 5  . Fluticasone-Salmeterol (ADVAIR DISKUS) 100-50 MCG/DOSE AEPB Inhale 1 puff into the lungs 2 (two) times daily. (Patient taking differently: Inhale 1 puff into the lungs 2 (two) times daily as needed. ) 1 each 3  . furosemide (LASIX) 20 MG tablet Take 1 tablet (20 mg total) by mouth daily as needed. 30 tablet 3  . pantoprazole (PROTONIX) 40 MG tablet Take 1 tablet (40 mg total) by mouth daily. (Patient taking differently: Take 40 mg by mouth daily as needed. ) 30 tablet 0  . potassium chloride (KLOR-CON 10) 10 MEQ tablet Take 1 tablet (10 mEq total) by mouth daily as needed. Take w/Furosemide 30 tablet 3  . Probiotic Product (ALIGN) 4 MG CAPS Take 1 capsule (4 mg total) by mouth daily. 30 capsule 0  . Respiratory Therapy Supplies (FLUTTER) DEVI Use as directed 1 each 0  . olmesartan (BENICAR) 20 MG tablet Take 1 tablet (20 mg total) by mouth daily. Patient needs office visit before refills will be given (Patient not taking: Reported on 11/24/2017) 30 tablet 0  . predniSONE (DELTASONE) 10 MG tablet Take 2 tabs with breakfast for 5 days (Patient not taking: Reported on 11/24/2017) 10 tablet 0   No facility-administered medications prior to visit.     ROS: Review of Systems    Constitutional: Negative for activity change, appetite change, chills, fatigue and unexpected weight change.  HENT: Negative for congestion, mouth sores and sinus pressure.   Eyes: Negative for visual disturbance.  Respiratory: Negative for cough and chest tightness.   Gastrointestinal: Negative for abdominal pain and nausea.  Genitourinary: Negative for difficulty urinating, frequency and vaginal pain.  Musculoskeletal: Negative for back pain and gait problem.  Skin: Negative for pallor and rash.  Neurological: Negative for dizziness, tremors, weakness, numbness and headaches.  Psychiatric/Behavioral: Negative for confusion and sleep disturbance.    Objective:  BP 132/80 (BP Location: Left Arm, Patient Position: Sitting, Cuff Size: Normal)   Pulse 93   Temp 97.9 F (36.6 C) (Oral)   Ht 4\' 11"  (1.499 m)   Wt 163 lb (73.9 kg)   LMP 04/08/1996   SpO2 93%   BMI 32.92 kg/m   BP Readings from Last 3 Encounters:  11/24/17 132/80  11/05/17 134/82  10/22/17 122/80    Wt Readings from Last 3 Encounters:  11/24/17 163 lb (73.9 kg)  11/05/17 165 lb (74.8 kg)  10/22/17 170 lb (77.1 kg)    Physical Exam  Constitutional: She appears well-developed. No distress.  HENT:  Head: Normocephalic.  Right Ear: External ear normal.  Left Ear: External ear normal.  Nose: Nose normal.  Mouth/Throat: Oropharynx is clear and moist.  Eyes: Pupils are equal, round, and reactive to light. Conjunctivae are normal. Right eye exhibits no discharge. Left eye exhibits no discharge.  Neck: Normal range of motion. Neck supple. No JVD present. No tracheal deviation present. No thyromegaly present.  Cardiovascular: Normal rate, regular rhythm and normal heart sounds.  Pulmonary/Chest: No stridor. No respiratory distress. She has no wheezes.  Abdominal: Soft. Bowel sounds are normal. She exhibits no distension and no mass. There is no tenderness. There is no rebound and no guarding.  Musculoskeletal: She  exhibits no edema or tenderness.  Lymphadenopathy:    She has no cervical adenopathy.  Neurological: She displays normal reflexes. No cranial nerve deficit. She exhibits normal muscle tone. Coordination normal.  Skin: No rash noted. No erythema.  Psychiatric: She has a normal mood and affect. Her behavior is normal. Judgment and thought content normal.    Lab Results  Component Value Date   WBC 5.5 11/04/2017   HGB 12.3 11/04/2017   HCT 37.1 11/04/2017   PLT 157.0 11/04/2017   GLUCOSE 109 (H) 11/04/2017   CHOL 218 (H) 11/04/2017   TRIG 88.0 11/04/2017   HDL 51.80 11/04/2017   LDLDIRECT 154.9 05/01/2011   LDLCALC 148 (H) 11/04/2017   ALT 12 11/04/2017   AST 14 11/04/2017   NA 141 11/04/2017   K 4.4 11/04/2017   CL 105 11/04/2017   CREATININE 0.81 11/04/2017   BUN 15 11/04/2017   CO2 29 11/04/2017   TSH 0.42 11/05/2017   HGBA1C 5.9 11/20/2016    Dg Chest 2 View  Result Date: 10/22/2017 CLINICAL DATA:  84 year old female with shortness of breath for 1-2 weeks, cough and congestion. EXAM: CHEST - 2 VIEW COMPARISON:  High-resolution chest CT 01/24/2017. Chest radiographs 01/01/2017, and earlier. FINDINGS: Stable lung volumes and mediastinal contours. No pneumothorax, pulmonary edema, pleural effusion or confluent pulmonary opacity. Mild chronic increased interstitial markings appear stable. Stable visualized osseous structures. Negative visible bowel gas pattern. Stable cholecystectomy clips. IMPRESSION: No acute cardiopulmonary abnormality. Electronically Signed   By: Genevie Ann M.D.   On: 10/22/2017 11:21    Assessment & Plan:   There are no diagnoses linked to this encounter.   No orders of the defined types were placed in this encounter.    Follow-up: No follow-ups on file.  Walker Kehr, MD

## 2017-11-24 NOTE — Assessment & Plan Note (Signed)
No sx's No need to treat

## 2017-12-10 ENCOUNTER — Encounter: Payer: Self-pay | Admitting: Obstetrics & Gynecology

## 2017-12-25 DIAGNOSIS — Z23 Encounter for immunization: Secondary | ICD-10-CM | POA: Diagnosis not present

## 2018-01-13 DIAGNOSIS — S8011XA Contusion of right lower leg, initial encounter: Secondary | ICD-10-CM | POA: Diagnosis not present

## 2018-01-13 DIAGNOSIS — M545 Low back pain: Secondary | ICD-10-CM | POA: Diagnosis not present

## 2018-02-06 DIAGNOSIS — M545 Low back pain: Secondary | ICD-10-CM | POA: Diagnosis not present

## 2018-02-06 DIAGNOSIS — M47896 Other spondylosis, lumbar region: Secondary | ICD-10-CM | POA: Diagnosis not present

## 2018-02-06 DIAGNOSIS — M5136 Other intervertebral disc degeneration, lumbar region: Secondary | ICD-10-CM | POA: Diagnosis not present

## 2018-02-11 DIAGNOSIS — M47896 Other spondylosis, lumbar region: Secondary | ICD-10-CM | POA: Diagnosis not present

## 2018-02-11 DIAGNOSIS — M545 Low back pain: Secondary | ICD-10-CM | POA: Diagnosis not present

## 2018-02-11 DIAGNOSIS — M5136 Other intervertebral disc degeneration, lumbar region: Secondary | ICD-10-CM | POA: Diagnosis not present

## 2018-02-13 DIAGNOSIS — M545 Low back pain: Secondary | ICD-10-CM | POA: Diagnosis not present

## 2018-02-13 DIAGNOSIS — M5136 Other intervertebral disc degeneration, lumbar region: Secondary | ICD-10-CM | POA: Diagnosis not present

## 2018-02-13 DIAGNOSIS — M47896 Other spondylosis, lumbar region: Secondary | ICD-10-CM | POA: Diagnosis not present

## 2018-02-17 DIAGNOSIS — M545 Low back pain: Secondary | ICD-10-CM | POA: Diagnosis not present

## 2018-02-17 DIAGNOSIS — M5136 Other intervertebral disc degeneration, lumbar region: Secondary | ICD-10-CM | POA: Diagnosis not present

## 2018-02-17 DIAGNOSIS — M47896 Other spondylosis, lumbar region: Secondary | ICD-10-CM | POA: Diagnosis not present

## 2018-02-20 DIAGNOSIS — M5136 Other intervertebral disc degeneration, lumbar region: Secondary | ICD-10-CM | POA: Diagnosis not present

## 2018-02-20 DIAGNOSIS — M545 Low back pain: Secondary | ICD-10-CM | POA: Diagnosis not present

## 2018-02-20 DIAGNOSIS — M47896 Other spondylosis, lumbar region: Secondary | ICD-10-CM | POA: Diagnosis not present

## 2018-02-25 DIAGNOSIS — M47896 Other spondylosis, lumbar region: Secondary | ICD-10-CM | POA: Diagnosis not present

## 2018-02-25 DIAGNOSIS — M545 Low back pain: Secondary | ICD-10-CM | POA: Diagnosis not present

## 2018-02-25 DIAGNOSIS — M5136 Other intervertebral disc degeneration, lumbar region: Secondary | ICD-10-CM | POA: Diagnosis not present

## 2018-03-02 DIAGNOSIS — M5136 Other intervertebral disc degeneration, lumbar region: Secondary | ICD-10-CM | POA: Diagnosis not present

## 2018-03-02 DIAGNOSIS — M545 Low back pain: Secondary | ICD-10-CM | POA: Diagnosis not present

## 2018-03-02 DIAGNOSIS — M47896 Other spondylosis, lumbar region: Secondary | ICD-10-CM | POA: Diagnosis not present

## 2018-03-04 ENCOUNTER — Ambulatory Visit (INDEPENDENT_AMBULATORY_CARE_PROVIDER_SITE_OTHER): Payer: Medicare Other | Admitting: Internal Medicine

## 2018-03-04 ENCOUNTER — Encounter: Payer: Self-pay | Admitting: Internal Medicine

## 2018-03-04 DIAGNOSIS — E785 Hyperlipidemia, unspecified: Secondary | ICD-10-CM

## 2018-03-04 DIAGNOSIS — N281 Cyst of kidney, acquired: Secondary | ICD-10-CM

## 2018-03-04 DIAGNOSIS — R9389 Abnormal findings on diagnostic imaging of other specified body structures: Secondary | ICD-10-CM

## 2018-03-04 DIAGNOSIS — I2510 Atherosclerotic heart disease of native coronary artery without angina pectoris: Secondary | ICD-10-CM | POA: Diagnosis not present

## 2018-03-04 MED ORDER — FLUTICASONE PROPIONATE 50 MCG/ACT NA SUSP: 2.0000 | Freq: Every day | NASAL | 11 refills | Status: DC

## 2018-03-04 NOTE — Progress Notes (Signed)
Subjective:  Patient ID: Diana Stout, female    DOB: [birth year]  Age: 84 y.o. MRN: [id number]  CC: No chief complaint on file.   HPI Diana Stout presents for LBP x 1 mo after trimming flowers x 2 hrs Better w/PT - Dr Alfonso Ramus  Outpatient Medications Prior to Visit  Medication Sig Dispense Refill  . aspirin 81 MG tablet Take 81 mg by mouth daily.      . cholecalciferol (VITAMIN D) 1000 UNITS tablet Take 1,000 Units by mouth daily.    . cyanocobalamin 1000 MCG tablet Take 1,000 mcg by mouth daily.    . famotidine (PEPCID) 20 MG tablet Take 1 tablet (20 mg total) by mouth 2 (two) times daily. 60 tablet 0  . fluticasone (FLONASE) 50 MCG/ACT nasal spray Place 2 sprays into both nostrils daily. 16 g 5  . Fluticasone-Salmeterol (ADVAIR DISKUS) 100-50 MCG/DOSE AEPB Inhale 1 puff into the lungs 2 (two) times daily. (Patient taking differently: Inhale 1 puff into the lungs 2 (two) times daily as needed. ) 1 each 3  . furosemide (LASIX) 20 MG tablet Take 1 tablet (20 mg total) by mouth daily as needed. 30 tablet 3  . olmesartan (BENICAR) 20 MG tablet Take 0.5 tablets (10 mg total) by mouth daily. 90 tablet 1  . pantoprazole (PROTONIX) 40 MG tablet Take 1 tablet (40 mg total) by mouth daily. (Patient taking differently: Take 40 mg by mouth daily as needed. ) 30 tablet 0  . potassium chloride (KLOR-CON 10) 10 MEQ tablet Take 1 tablet (10 mEq total) by mouth daily as needed. Take w/Furosemide 30 tablet 3  . Probiotic Product (ALIGN) 4 MG CAPS Take 1 capsule (4 mg total) by mouth daily. 30 capsule 0  . Respiratory Therapy Supplies (FLUTTER) DEVI Use as directed 1 each 0   No facility-administered medications prior to visit.     ROS: Review of Systems  Constitutional: Negative for activity change, appetite change, chills, fatigue and unexpected weight change.  HENT: Negative for congestion, mouth sores and sinus pressure.   Eyes: Negative for visual disturbance.  Respiratory: Negative for  cough and chest tightness.   Gastrointestinal: Negative for abdominal pain and nausea.  Genitourinary: Negative for difficulty urinating, frequency and vaginal pain.  Musculoskeletal: Positive for back pain. Negative for gait problem.  Skin: Negative for pallor and rash.  Neurological: Negative for dizziness, tremors, weakness, numbness and headaches.  Psychiatric/Behavioral: Negative for confusion and sleep disturbance.    Objective:  BP 128/82 (BP Location: Left Arm, Patient Position: Sitting, Cuff Size: Normal)   Pulse 75   Temp 98 F (36.7 C) (Oral)   Ht 4\' 11"  (1.499 m)   Wt 162 lb (73.5 kg)   LMP 04/08/1996   SpO2 97%   BMI 32.72 kg/m   BP Readings from Last 3 Encounters:  03/04/18 128/82  11/24/17 132/80  11/05/17 134/82    Wt Readings from Last 3 Encounters:  03/04/18 162 lb (73.5 kg)  11/24/17 163 lb (73.9 kg)  11/05/17 165 lb (74.8 kg)    Physical Exam  Constitutional: She appears well-developed. No distress.  HENT:  Head: Normocephalic.  Right Ear: External ear normal.  Left Ear: External ear normal.  Nose: Nose normal.  Mouth/Throat: Oropharynx is clear and moist.  Eyes: Pupils are equal, round, and reactive to light. Conjunctivae are normal. Right eye exhibits no discharge. Left eye exhibits no discharge.  Neck: Normal range of motion. Neck supple. No JVD present. No tracheal deviation  present. No thyromegaly present.  Cardiovascular: Normal rate, regular rhythm and normal heart sounds.  Pulmonary/Chest: No stridor. No respiratory distress. She has no wheezes.  Abdominal: Soft. Bowel sounds are normal. She exhibits no distension and no mass. There is no tenderness. There is no rebound and no guarding.  Musculoskeletal: She exhibits no edema or tenderness.  Lymphadenopathy:    She has no cervical adenopathy.  Neurological: She displays normal reflexes. No cranial nerve deficit. She exhibits normal muscle tone. Coordination normal.  Skin: No rash noted. No  erythema.  Psychiatric: She has a normal mood and affect. Her behavior is normal. Judgment and thought content normal.  LS NT  Lab Results  Component Value Date   WBC 5.5 11/04/2017   HGB 12.3 11/04/2017   HCT 37.1 11/04/2017   PLT 157.0 11/04/2017   GLUCOSE 109 (H) 11/04/2017   CHOL 218 (H) 11/04/2017   TRIG 88.0 11/04/2017   HDL 51.80 11/04/2017   LDLDIRECT 154.9 05/01/2011   LDLCALC 148 (H) 11/04/2017   ALT 12 11/04/2017   AST 14 11/04/2017   NA 141 11/04/2017   K 4.4 11/04/2017   CL 105 11/04/2017   CREATININE 0.81 11/04/2017   BUN 15 11/04/2017   CO2 29 11/04/2017   TSH 0.42 11/05/2017   HGBA1C 5.9 11/20/2016    Dg Chest 2 View  Result Date: 10/22/2017 CLINICAL DATA:  84 year old female with shortness of breath for 1-2 weeks, cough and congestion. EXAM: CHEST - 2 VIEW COMPARISON:  High-resolution chest CT 01/24/2017. Chest radiographs 01/01/2017, and earlier. FINDINGS: Stable lung volumes and mediastinal contours. No pneumothorax, pulmonary edema, pleural effusion or confluent pulmonary opacity. Mild chronic increased interstitial markings appear stable. Stable visualized osseous structures. Negative visible bowel gas pattern. Stable cholecystectomy clips. IMPRESSION: No acute cardiopulmonary abnormality. Electronically Signed   By: Genevie Ann M.D.   On: 10/22/2017 11:21    Assessment & Plan:   There are no diagnoses linked to this encounter.   No orders of the defined types were placed in this encounter.    Follow-up: No follow-ups on file.  Walker Kehr, MD

## 2018-03-04 NOTE — Assessment & Plan Note (Signed)
Followed in Pulmonary clinic

## 2018-03-04 NOTE — Assessment & Plan Note (Signed)
Statin intolerant 

## 2018-03-04 NOTE — Assessment & Plan Note (Addendum)
2V CAD on CT 2018 ASA Statin intolerant Red Rice Yeast

## 2018-03-04 NOTE — Patient Instructions (Signed)
[place] Rice Yeast 

## 2018-03-04 NOTE — Assessment & Plan Note (Signed)
Last [place] 4/18

## 2018-03-17 DIAGNOSIS — M5136 Other intervertebral disc degeneration, lumbar region: Secondary | ICD-10-CM | POA: Diagnosis not present

## 2018-03-17 DIAGNOSIS — M545 Low back pain: Secondary | ICD-10-CM | POA: Diagnosis not present

## 2018-03-17 DIAGNOSIS — M47896 Other spondylosis, lumbar region: Secondary | ICD-10-CM | POA: Diagnosis not present

## 2018-03-20 DIAGNOSIS — M47896 Other spondylosis, lumbar region: Secondary | ICD-10-CM | POA: Diagnosis not present

## 2018-03-20 DIAGNOSIS — M545 Low back pain: Secondary | ICD-10-CM | POA: Diagnosis not present

## 2018-03-20 DIAGNOSIS — M5136 Other intervertebral disc degeneration, lumbar region: Secondary | ICD-10-CM | POA: Diagnosis not present

## 2018-03-24 DIAGNOSIS — M47896 Other spondylosis, lumbar region: Secondary | ICD-10-CM | POA: Diagnosis not present

## 2018-03-24 DIAGNOSIS — M545 Low back pain: Secondary | ICD-10-CM | POA: Diagnosis not present

## 2018-03-24 DIAGNOSIS — M5136 Other intervertebral disc degeneration, lumbar region: Secondary | ICD-10-CM | POA: Diagnosis not present

## 2018-04-24 DIAGNOSIS — Z85828 Personal history of other malignant neoplasm of skin: Secondary | ICD-10-CM | POA: Diagnosis not present

## 2018-04-24 DIAGNOSIS — D485 Neoplasm of uncertain behavior of skin: Secondary | ICD-10-CM | POA: Diagnosis not present

## 2018-04-24 DIAGNOSIS — D0439 Carcinoma in situ of skin of other parts of face: Secondary | ICD-10-CM | POA: Diagnosis not present

## 2018-04-24 DIAGNOSIS — C44329 Squamous cell carcinoma of skin of other parts of face: Secondary | ICD-10-CM | POA: Diagnosis not present

## 2018-05-09 HISTORY — PX: MOHS SURGERY: SUR867

## 2018-05-15 DIAGNOSIS — C44329 Squamous cell carcinoma of skin of other parts of face: Secondary | ICD-10-CM | POA: Diagnosis not present

## 2018-05-15 DIAGNOSIS — Z85828 Personal history of other malignant neoplasm of skin: Secondary | ICD-10-CM | POA: Diagnosis not present

## 2018-05-19 ENCOUNTER — Ambulatory Visit: Payer: Medicare Other | Admitting: Pulmonary Disease

## 2018-05-20 DIAGNOSIS — C44329 Squamous cell carcinoma of skin of other parts of face: Secondary | ICD-10-CM | POA: Diagnosis not present

## 2018-05-20 DIAGNOSIS — Z85828 Personal history of other malignant neoplasm of skin: Secondary | ICD-10-CM | POA: Diagnosis not present

## 2018-05-29 ENCOUNTER — Ambulatory Visit: Payer: Medicare Other | Admitting: Pulmonary Disease

## 2018-06-01 ENCOUNTER — Ambulatory Visit: Payer: Medicare Other | Admitting: Pulmonary Disease

## 2018-06-03 DIAGNOSIS — C44319 Basal cell carcinoma of skin of other parts of face: Secondary | ICD-10-CM | POA: Diagnosis not present

## 2018-06-03 DIAGNOSIS — D485 Neoplasm of uncertain behavior of skin: Secondary | ICD-10-CM | POA: Diagnosis not present

## 2018-06-03 DIAGNOSIS — L570 Actinic keratosis: Secondary | ICD-10-CM | POA: Diagnosis not present

## 2018-06-03 DIAGNOSIS — Z85828 Personal history of other malignant neoplasm of skin: Secondary | ICD-10-CM | POA: Diagnosis not present

## 2018-06-03 DIAGNOSIS — L821 Other seborrheic keratosis: Secondary | ICD-10-CM | POA: Diagnosis not present

## 2018-06-03 DIAGNOSIS — D225 Melanocytic nevi of trunk: Secondary | ICD-10-CM | POA: Diagnosis not present

## 2018-06-03 DIAGNOSIS — L814 Other melanin hyperpigmentation: Secondary | ICD-10-CM | POA: Diagnosis not present

## 2018-06-03 DIAGNOSIS — L723 Sebaceous cyst: Secondary | ICD-10-CM | POA: Diagnosis not present

## 2018-06-05 ENCOUNTER — Ambulatory Visit (INDEPENDENT_AMBULATORY_CARE_PROVIDER_SITE_OTHER): Payer: Medicare Other | Admitting: Pulmonary Disease

## 2018-06-05 ENCOUNTER — Encounter: Payer: Self-pay | Admitting: Pulmonary Disease

## 2018-06-05 DIAGNOSIS — J189 Pneumonia, unspecified organism: Secondary | ICD-10-CM

## 2018-06-05 DIAGNOSIS — R05 Cough: Secondary | ICD-10-CM

## 2018-06-05 DIAGNOSIS — R058 Other specified cough: Secondary | ICD-10-CM

## 2018-06-05 MED ORDER — FLUTICASONE PROPIONATE 50 MCG/ACT NA SUSP: 2.0000 | NASAL | 3 refills | Status: DC | PRN

## 2018-06-05 NOTE — Assessment & Plan Note (Signed)
Resolved, okay to stop Advair and other bronchodilators

## 2018-06-05 NOTE — Assessment & Plan Note (Signed)
This may have been related to reflux or mild bronchiectasis. At any rate this is controlled now.  No signs of immunodeficiency. No further work-up required

## 2018-06-05 NOTE — Patient Instructions (Signed)
Call us as needed.

## 2018-06-05 NOTE — Progress Notes (Signed)
   Subjective:    Patient ID: [name], female    DOB: [birth year], 85 y.o.   MRN: [id number]  HPI 85 yo never smoker for FU of recurrent pneumonia  She has done well over the last 2 years  Last flareup 10/2017 treated with Augmentin She did have reflux, she now sleeps with a wedge pillow and her last meal was around 4 PM.  She has been able to stop PPIs  She has come off Advair.  She is compliant with Flonase. Takes Lasix as needed for leg edema   Significant tests/ events reviewed  HRCT 01/2017 no bronchiectasis, mild air trapping CT chest 04/2012 stable pulmonary nodules compared to 05/2011,small hiatal hernia  Spirometry 01/2017 - no evidence of airway obstruction  CT chest images from 2013 and 2014 >> mild bronchiectasis  Review of Systems Patient denies significant dyspnea,cough, hemoptysis,  chest pain, palpitations, pedal edema, orthopnea, paroxysmal nocturnal dyspnea, lightheadedness, nausea, vomiting, abdominal or  leg pains      Objective:   Physical Exam  Gen. Pleasant, obese, in no distress ENT - no lesions, no post nasal drip Neck: No JVD, no thyromegaly, no carotid bruits Lungs: no use of accessory muscles, no dullness to percussion, decreased without rales or rhonchi  Cardiovascular: Rhythm regular, heart sounds  normal, no murmurs or gallops, no peripheral edema Musculoskeletal: No deformities, no cyanosis or clubbing , no tremors       Assessment & Plan:

## 2018-07-07 ENCOUNTER — Telehealth: Payer: Self-pay | Admitting: Internal Medicine

## 2018-07-07 NOTE — Telephone Encounter (Signed)
Patient cancelled follow up that was scheduled 4/1.  She prefers to hold off on her appointment until COVID 19 has cleared out some.  She is however requesting refills on Potassium and furosemide to be sent to [facility] on [address].

## 2018-07-08 ENCOUNTER — Ambulatory Visit: Payer: Medicare Other | Admitting: Internal Medicine

## 2018-07-08 ENCOUNTER — Other Ambulatory Visit: Payer: Self-pay | Admitting: Internal Medicine

## 2018-07-08 MED ORDER — FUROSEMIDE 20 MG PO TABS: 20.0000 mg | ORAL_TABLET | Freq: Every day | ORAL | 1 refills | Status: DC | PRN

## 2018-07-08 MED ORDER — POTASSIUM CHLORIDE ER 10 MEQ PO TBCR: 10.0000 meq | EXTENDED_RELEASE_TABLET | Freq: Every day | ORAL | 1 refills | Status: DC | PRN

## 2018-07-08 NOTE — Telephone Encounter (Signed)
Rx sent 

## 2018-08-18 ENCOUNTER — Telehealth: Payer: Self-pay | Admitting: Internal Medicine

## 2018-08-18 ENCOUNTER — Other Ambulatory Visit (INDEPENDENT_AMBULATORY_CARE_PROVIDER_SITE_OTHER): Payer: Medicare Other

## 2018-08-18 DIAGNOSIS — R300 Dysuria: Secondary | ICD-10-CM

## 2018-08-18 LAB — URINALYSIS, ROUTINE W REFLEX MICROSCOPIC
Bilirubin Urine: NEGATIVE
Ketones, ur: NEGATIVE
Nitrite: NEGATIVE
Specific Gravity, Urine: 1.015 (ref 1.000–1.030)
Total Protein, Urine: 100 — AB
Urine Glucose: NEGATIVE
Urobilinogen, UA: 0.2 (ref 0.0–1.0)
pH: 7 (ref 5.0–8.0)

## 2018-08-18 NOTE — Telephone Encounter (Signed)
Can we at least ask her to come to the LAB for urine studies -  I have ordered, and hoping she can do this  If not let me know, but we prefer the urine studies at least to document the need and determine the cause of the infection,   Just let me know, thanks

## 2018-08-18 NOTE — Telephone Encounter (Signed)
pt notified and will come into lab to give sample

## 2018-08-18 NOTE — Telephone Encounter (Signed)
Pt called back in to get results of urine sample. She would like to know if med was called in?

## 2018-08-18 NOTE — Telephone Encounter (Signed)
Please advise in Dr. Plotnikovs absence 

## 2018-08-18 NOTE — Telephone Encounter (Signed)
Copied from [place] [id number]. Topic: Quick Communication - See Telephone Encounter >> Aug 18, 2018  9:13 AM Sheran Luz wrote: CRM for notification. See Telephone encounter for: 08/18/18.   Patient states that she has been experiencing frequent urination/burning with urination x3 days. She states that she is unable to do a virtual visit and inquired if medication for UTI can be sent to pharmacy. Please advise.  Pharmacy: [facility] [place], [place] [phone] (Phone) [phone] (Fax)

## 2018-08-19 ENCOUNTER — Other Ambulatory Visit: Payer: Self-pay | Admitting: Internal Medicine

## 2018-08-19 MED ORDER — SULFAMETHOXAZOLE-TRIMETHOPRIM 800-160 MG PO TABS: 1.0000 | ORAL_TABLET | Freq: Two times a day (BID) | ORAL | 0 refills | Status: DC

## 2018-08-19 NOTE — Telephone Encounter (Signed)
[place] for antibiotic, I will send

## 2018-08-19 NOTE — Telephone Encounter (Signed)
Pt.notified

## 2018-08-19 NOTE — Telephone Encounter (Signed)
Patient calling back to inquire if a medication would be sent into pharmacy today. Please advise.

## 2018-08-19 NOTE — Telephone Encounter (Signed)
I do not see where the patient is currently taking any medication or where one was sent in, please advise.

## 2018-08-20 LAB — URINE CULTURE
MICRO NUMBER:: 466399
SPECIMEN QUALITY:: ADEQUATE

## 2018-08-21 ENCOUNTER — Ambulatory Visit: Payer: Medicare Other | Admitting: Obstetrics & Gynecology

## 2018-08-28 ENCOUNTER — Telehealth: Payer: Self-pay | Admitting: Internal Medicine

## 2018-08-28 NOTE — Telephone Encounter (Signed)
Schedule OV as long as pt has no symptoms.

## 2018-08-28 NOTE — Telephone Encounter (Signed)
Patient requesting in office appointment.  Patient wants labs before appointment.  Please call patient back to schedule at [phone].

## 2018-08-28 NOTE — Telephone Encounter (Signed)
Left patient vm to call back to schedule.  °

## 2018-09-16 ENCOUNTER — Other Ambulatory Visit: Payer: Self-pay

## 2018-09-16 ENCOUNTER — Telehealth: Payer: Self-pay | Admitting: Internal Medicine

## 2018-09-16 DIAGNOSIS — I10 Essential (primary) hypertension: Secondary | ICD-10-CM

## 2018-09-16 DIAGNOSIS — E785 Hyperlipidemia, unspecified: Secondary | ICD-10-CM

## 2018-09-16 NOTE — Telephone Encounter (Signed)
Labs entered.

## 2018-09-16 NOTE — Telephone Encounter (Signed)
Patient is scheduled for 6/22.  She is requesting labs to be entered so she can have done before appt.

## 2018-09-17 ENCOUNTER — Encounter: Payer: Self-pay | Admitting: Obstetrics & Gynecology

## 2018-09-17 ENCOUNTER — Ambulatory Visit (INDEPENDENT_AMBULATORY_CARE_PROVIDER_SITE_OTHER): Payer: Medicare Other | Admitting: Obstetrics & Gynecology

## 2018-09-17 VITALS — BP 122/84 | HR 76 | Temp 97.5°F | Ht 58.5 in | Wt 169.0 lb

## 2018-09-17 DIAGNOSIS — N8111 Cystocele, midline: Secondary | ICD-10-CM

## 2018-09-17 DIAGNOSIS — Z01419 Encounter for gynecological examination (general) (routine) without abnormal findings: Secondary | ICD-10-CM | POA: Diagnosis not present

## 2018-09-17 DIAGNOSIS — Z124 Encounter for screening for malignant neoplasm of cervix: Secondary | ICD-10-CM | POA: Diagnosis not present

## 2018-09-17 NOTE — Progress Notes (Signed)
85 y.o. G22P3003 Widowed White or Caucasian female here for annual exam.  Issue of the last few years has been pneumonia.  Sees Dr. Elsworth Soho yearly.    Denies vaginal bleeding.  PCP:  Dr. Elsworth Soho GI:  Dr. Havery Moros  Patient's last menstrual period was 04/08/1996.          Sexually active: No.  The current method of family planning is post menopausal status.    Exercising: No.   Smoker:  no  Health Maintenance: Pap:  02/16/16 neg   11/05/13 neg  History of abnormal Pap:  no MMG:  11/20/17 BIRADS2:Benign. F/u 1 year. Colonoscopy:  05/29/10 Polyps BMD:   11/17/14 Osteopenia  TDaP:  2017 Pneumonia vaccine(s):  2018 (updated pneumovax 2018) Shingrix:  Zostavax (10/18) Hep C testing: n/a Screening Labs: PCP   reports that she has never smoked. She has never used smokeless tobacco. She reports that she does not drink alcohol or use drugs.  Past Medical History:  Diagnosis Date  . Abdominal pain, lower 11/22/2010   2015 resolved off Zocor 5/17?urticaria w/GI upset: Dtr is allergic to seafood - the pt is on MegaRed krill oil - d/c MegaRed   . Abnormal chest CT 05/22/2011   Followed in Pulmonary clinic/ [facility]/ [place]  05/14/11 CT CHEST WITHOUT CONTRAST 1. Multiple small pulmonary nodules scattered throughout the lungs  bilaterally ranging in size from 3-6 mm.    2. There is a small hiatal hernia.  3. Atherosclerosis.  4. Status post cholecystectomy.  5. Low attenuation hepatic lesions unchanged compared to recent  abdominal CT scan, likely to represent cysts  . Acute pharyngitis 02/28/2007   Qualifier: Diagnosis of  By: [name] MD, [name]   . ANEMIA, NORMOCYTIC 08/10/2009   mild    . BRONCHITIS, ACUTE 06/12/2007   Qualifier: Diagnosis of  By: Plotnikov MD, [name] Canker sore 01/01/2017   9/18   x1  . CAP (community acquired pneumonia) 01/01/2017   9/18 probable L lung  . Chest pain, atypical 06/22/2014   06/22/14 ?etiology   . Cough 04/20/2007   Qualifier: Diagnosis of  By: Plotnikov  MD, [name]   . Diarrhea 07/19/2013   4/15 x 1 mo ?etiol 8/18  . DIVERTICULOSIS, COLON 03/08/2007   Qualifier: Diagnosis of  By: [name] MD, [name]   . Dizziness 07/19/2013   Likely due to diarrhea/meds 4/15   . DVT (deep venous thrombosis) ([place]) 1998   hx of right  . DVT, HX OF 02/02/2007   Qualifier: Diagnosis of  By: [name]    . Dyslipidemia 02/02/2007   Chronic 1/13 worse    . Dyspnea 07/01/2008   2/13 improved w/reg exercise 1/17 deconditioning - CL stress test was nl    . Dysuria 05/18/2013   2/15, 1/17   . Edema 09/27/2009   Qualifier: Diagnosis of  By: [name], MD, [name]   . ESOPHAGEAL STRICTURE 03/06/2007   Qualifier: Diagnosis of  By: [name] MD, [name]   . Esophagitis 2006  . Essential hypertension 03/08/2007   Chronic  On Benicar   . Fatigue 10/15/2011   2015 resolved off Zocor   . FOOT PAIN 07/25/2009   R foot OA    . GERD 02/02/2007   Wedge pillow Protonix prn, Pepcid - d/c  . GLUCOSE INTOLERANCE 03/06/2007   Qualifier: Diagnosis of  By: [name] MD, [name]   . History of shingles   . Hyperlipidemia   . Hypoxemia 08/01/2009  Qualifier: Diagnosis of  By: Plotnikov MD, [place] IBS 03/06/2007   Qualifier: Diagnosis of  By: [name] MD, [name]   . Impaired glucose tolerance 11/22/2010  . Kidney cyst, acquired 07/22/2016   Remote   . Kidney cysts    left- Dr [name]  . Lung nodule 05/08/2011   04/15/11 RLL 4 mm nodule on abd CT - [facility] office   . Multiple lung nodules 2012   seen on CT scan by Dr [name], pulmonologist  . Nausea alone 07/01/2008   2015 resolved off Zocor    . Obesity 01/03/2015  . Osteoporosis 02/02/2007   Chronic  Vit D, yoga  . Palpitations 05/12/2014   Dr [name] 2/16 poss due to steroid pack   . Pneumonia 12/2016  . Rash and nonspecific skin eruption 09/12/2015   5/17?urticaria Dtr is allergic to seafood - the pt is on MegaRed krill oil - d/c MegaRed   . Recurrent pneumonia 08/15/2009   Qualifier: Diagnosis of  By: Plotnikov MD,  [name]   . Right-sided thoracic back pain 05/12/2014   2/16 MSK   . SHINGLES, HX OF 03/06/2007   Qualifier: Diagnosis of  By: [name] MD, [name]   . SINUSITIS- ACUTE-NOS 03/06/2007   Qualifier: Diagnosis of  By: [name] MD, [name]   . SKIN RASH 08/10/2009   Qualifier: Diagnosis of  By: Plotnikov MD, [name]   . Snoring 12/22/2013   Worse 9/15   . Upper airway cough syndrome 12/08/2014   Dr [name] Flutter valve added 01/02/2015 > resolved 02/03/2015  - rec try off gerd rx p 03/09/15   . URI, acute 01/01/2011   9/12, 7/13, 1/14, 2/15, 9/16   . UTI 07/19/2008   Qualifier: Diagnosis of  By: Plotnikov MD, [name]   . Wheezing 12/08/2014    Past Surgical History:  Procedure Laterality Date  . APPENDECTOMY    . CHOLECYSTECTOMY    . HYSTEROSCOPY WITH RESECTOSCOPE  7/99   resect polyp  . MOHS SURGERY  05/2018   face  . TUBAL LIGATION Bilateral     Current Outpatient Medications  Medication Sig Dispense Refill  . aspirin 81 MG tablet Take 81 mg by mouth daily.      . cholecalciferol (VITAMIN D) 1000 UNITS tablet Take 1,000 Units by mouth daily.    . cyanocobalamin 1000 MCG tablet Take 1,000 mcg by mouth daily.    . fluticasone (FLONASE) 50 MCG/ACT nasal spray Place 2 sprays into both nostrils as needed. 16 g 3  . furosemide (LASIX) 20 MG tablet TAKE 1 TABLET(20 MG) BY MOUTH DAILY AS NEEDED 90 tablet 3  . Magnesium 400 MG CAPS Take by mouth daily.    . potassium chloride (K-DUR) 10 MEQ tablet TAKE 1 TABLET(10 MEQ) BY MOUTH DAILY WITH FUROSEMIDE AS NEEDED 90 tablet 3  . Probiotic Product (ALIGN) 4 MG CAPS Take 1 capsule (4 mg total) by mouth daily. 30 capsule 0  . Respiratory Therapy Supplies (FLUTTER) DEVI Use as directed 1 each 0   No current facility-administered medications for this visit.     Family History  Problem Relation Age of Onset  . Asthma Mother   . Diabetes Father   . Cancer Father        liver  . Cancer Sister        breast  . Diabetes Sister   . Asthma Sister   .  Breast cancer Sister   . Diabetes Brother   .  Asthma Brother   . Asthma Sister   . Diabetes Brother   . Coronary artery disease Other        1 st degree female relative  . Thyroid disease Maternal Uncle     Review of Systems  Genitourinary:       Vaginal bumps   All other systems reviewed and are negative.   Exam:   BP 122/84   Pulse 76   Temp (!) 97.5 F (36.4 C) (Temporal)   Ht 4' 10.5" (1.486 m)   Wt 169 lb (76.7 kg)   LMP 04/08/1996   BMI 34.72 kg/m    Height: 4' 10.5" (148.6 cm)  Ht Readings from Last 3 Encounters:  09/17/18 4' 10.5" (1.486 m)  06/05/18 4\' 11"  (1.499 m)  03/04/18 4\' 11"  (1.499 m)    General appearance: alert, cooperative and appears stated age Head: Normocephalic, without obvious abnormality, atraumatic Neck: no adenopathy, supple, symmetrical, trachea midline and thyroid normal to inspection and palpation Lungs: clear to auscultation bilaterally Breasts: normal appearance, no masses or tenderness Heart: regular rate and rhythm Abdomen: soft, non-tender; bowel sounds normal; no masses,  no organomegaly Extremities: extremities normal, atraumatic, no cyanosis or edema Skin: Skin color, texture, turgor normal. No rashes or lesions Lymph nodes: Cervical, supraclavicular, and axillary nodes normal. No abnormal inguinal nodes palpated Neurologic: Grossly normal   Pelvic: External genitalia:  no lesions, vulvar sebaceous cysts              Urethra:  normal appearing urethra with no masses, tenderness or lesions              Bartholins and Skenes: normal                 Vagina: normal appearing vagina with normal color and discharge, no lesions              Cervix: no lesions              Pap taken: No. Bimanual Exam:  Uterus:  normal size, contour, position, consistency, mobility, non-tender              Adnexa: normal adnexa and no mass, fullness, tenderness               Rectovaginal: Confirms               Anus:  normal sphincter tone, no  lesions  Chaperone was present for exam.  A:  Well Woman with normal exam PMP, no HRT Osteopenia Incomplete uterine prolapse with cystocele Recent UTI  P:   Mammogram guidelines reviewed pap smear not indicated Voiding techniques reviewed. Blood work and repeat urine culture schedule with Dr. Alain Marion 09/28/2018 Colonoscopy not indicated due to age return annually or prn

## 2018-09-22 ENCOUNTER — Other Ambulatory Visit (INDEPENDENT_AMBULATORY_CARE_PROVIDER_SITE_OTHER): Payer: Medicare Other

## 2018-09-22 DIAGNOSIS — E785 Hyperlipidemia, unspecified: Secondary | ICD-10-CM | POA: Diagnosis not present

## 2018-09-22 DIAGNOSIS — I10 Essential (primary) hypertension: Secondary | ICD-10-CM

## 2018-09-22 LAB — LIPID PANEL
Cholesterol: 185 mg/dL (ref 0–200)
HDL: 41.1 mg/dL (ref >39.00)
LDL Cholesterol: 120 mg/dL — ABNORMAL HIGH (ref 0–99)
NonHDL: 143.73
Total CHOL/HDL Ratio: 4
Triglycerides: 118 mg/dL (ref 0.0–149.0)
VLDL: 23.6 mg/dL (ref 0.0–40.0)

## 2018-09-22 LAB — URINALYSIS
Bilirubin Urine: NEGATIVE
Ketones, ur: NEGATIVE
Leukocytes,Ua: NEGATIVE
Nitrite: NEGATIVE
Specific Gravity, Urine: 1.025 (ref 1.000–1.030)
Total Protein, Urine: NEGATIVE
Urine Glucose: NEGATIVE
Urobilinogen, UA: 0.2 (ref 0.0–1.0)
pH: 5.5 (ref 5.0–8.0)

## 2018-09-22 LAB — CBC WITH DIFFERENTIAL/PLATELET
Basophils Absolute: 0 10*3/uL (ref 0.0–0.1)
Basophils Relative: 0.9 % (ref 0.0–3.0)
Eosinophils Absolute: 0.2 10*3/uL (ref 0.0–0.7)
Eosinophils Relative: 4 % (ref 0.0–5.0)
HCT: 36.8 % (ref 36.0–46.0)
Hemoglobin: 12 g/dL (ref 12.0–15.0)
Lymphocytes Relative: 36.3 % (ref 12.0–46.0)
Lymphs Abs: 1.5 10*3/uL (ref 0.7–4.0)
MCHC: 32.6 g/dL (ref 30.0–36.0)
MCV: 86.8 fl (ref 78.0–100.0)
Monocytes Absolute: 0.4 10*3/uL (ref 0.1–1.0)
Monocytes Relative: 9.4 % (ref 3.0–12.0)
Neutro Abs: 2.1 10*3/uL (ref 1.4–7.7)
Neutrophils Relative %: 49.4 % (ref 43.0–77.0)
Platelets: 174 10*3/uL (ref 150.0–400.0)
RBC: 4.24 Mil/uL (ref 3.87–5.11)
RDW: 15.1 % (ref 11.5–15.5)
WBC: 4.3 10*3/uL (ref 4.0–10.5)

## 2018-09-22 LAB — HEPATIC FUNCTION PANEL
ALT: 14 U/L (ref 0–35)
AST: 17 U/L (ref 0–37)
Albumin: 3.8 g/dL (ref 3.5–5.2)
Alkaline Phosphatase: 75 U/L (ref 39–117)
Bilirubin, Direct: 0 mg/dL (ref 0.0–0.3)
Total Bilirubin: 0.5 mg/dL (ref 0.2–1.2)
Total Protein: 6.7 g/dL (ref 6.0–8.3)

## 2018-09-22 LAB — BASIC METABOLIC PANEL
BUN: 22 mg/dL (ref 6–23)
CO2: 28 mEq/L (ref 19–32)
Calcium: 9.1 mg/dL (ref 8.4–10.5)
Chloride: 106 mEq/L (ref 96–112)
Creatinine, Ser: 0.73 mg/dL (ref 0.40–1.20)
GFR: 75.67 mL/min (ref >60.00)
Glucose, Bld: 103 mg/dL — ABNORMAL HIGH (ref 70–99)
Potassium: 4.2 mEq/L (ref 3.5–5.1)
Sodium: 142 mEq/L (ref 135–145)

## 2018-09-22 LAB — TSH: TSH: 1 u[IU]/mL (ref 0.35–4.50)

## 2018-09-28 ENCOUNTER — Ambulatory Visit (INDEPENDENT_AMBULATORY_CARE_PROVIDER_SITE_OTHER): Payer: Medicare Other | Admitting: Internal Medicine

## 2018-09-28 ENCOUNTER — Encounter: Payer: Self-pay | Admitting: Internal Medicine

## 2018-09-28 ENCOUNTER — Other Ambulatory Visit: Payer: Self-pay

## 2018-09-28 DIAGNOSIS — I10 Essential (primary) hypertension: Secondary | ICD-10-CM | POA: Diagnosis not present

## 2018-09-28 DIAGNOSIS — E785 Hyperlipidemia, unspecified: Secondary | ICD-10-CM

## 2018-09-28 DIAGNOSIS — I2510 Atherosclerotic heart disease of native coronary artery without angina pectoris: Secondary | ICD-10-CM

## 2018-09-28 DIAGNOSIS — R5383 Other fatigue: Secondary | ICD-10-CM

## 2018-09-28 MED ORDER — FUROSEMIDE 20 MG PO TABS: ORAL_TABLET | ORAL | 3 refills | Status: DC

## 2018-09-28 MED ORDER — POTASSIUM CHLORIDE ER 10 MEQ PO TBCR: EXTENDED_RELEASE_TABLET | ORAL | 3 refills | Status: DC

## 2018-09-28 MED ORDER — FLUTICASONE PROPIONATE 50 MCG/ACT NA SUSP: 2.0000 | NASAL | 3 refills | Status: DC | PRN

## 2018-09-28 NOTE — Assessment & Plan Note (Signed)
Fatigued

## 2018-09-28 NOTE — Assessment & Plan Note (Signed)
Statin intolerant Red Rice Yeast

## 2018-09-28 NOTE — Assessment & Plan Note (Signed)
Statin intolerant 

## 2018-09-28 NOTE — Assessment & Plan Note (Signed)
BP Readings from Last 3 Encounters:  09/28/18 124/74  09/17/18 122/84  06/05/18 124/76

## 2018-09-28 NOTE — Progress Notes (Signed)
Subjective:  Patient ID: Diana Stout, female    DOB: 02/15/1931  Age: 85 y.o. MRN: [id number]  CC: No chief complaint on file.   HPI PERRIS TRIPATHI presents for HTN, edema, B12 def  Outpatient Medications Prior to Visit  Medication Sig Dispense Refill  . aspirin 81 MG tablet Take 81 mg by mouth daily.      . cholecalciferol (VITAMIN D) 1000 UNITS tablet Take 1,000 Units by mouth daily.    . cyanocobalamin 1000 MCG tablet Take 1,000 mcg by mouth daily.    . fluticasone (FLONASE) 50 MCG/ACT nasal spray Place 2 sprays into both nostrils as needed. 16 g 3  . furosemide (LASIX) 20 MG tablet TAKE 1 TABLET(20 MG) BY MOUTH DAILY AS NEEDED 90 tablet 3  . Magnesium 400 MG CAPS Take by mouth daily.    . potassium chloride (K-DUR) 10 MEQ tablet TAKE 1 TABLET(10 MEQ) BY MOUTH DAILY WITH FUROSEMIDE AS NEEDED 90 tablet 3  . Probiotic Product (ALIGN) 4 MG CAPS Take 1 capsule (4 mg total) by mouth daily. 30 capsule 0  . Respiratory Therapy Supplies (FLUTTER) DEVI Use as directed 1 each 0   No facility-administered medications prior to visit.     ROS: Review of Systems  Constitutional: Negative for activity change, appetite change, chills, fatigue and unexpected weight change.  HENT: Negative for congestion, mouth sores and sinus pressure.   Eyes: Negative for visual disturbance.  Respiratory: Negative for cough and chest tightness.   Gastrointestinal: Negative for abdominal pain and nausea.  Genitourinary: Negative for difficulty urinating, frequency and vaginal pain.  Musculoskeletal: Positive for arthralgias. Negative for back pain and gait problem.  Skin: Negative for pallor and rash.  Neurological: Negative for dizziness, tremors, weakness, numbness and headaches.  Psychiatric/Behavioral: Negative for confusion and sleep disturbance.  R shoulder hurts Ankle edema  Objective:  BP 124/74 (BP Location: Left Arm, Patient Position: Sitting, Cuff Size: Large)   Pulse 75   Temp 98.1 F  (36.7 C) (Oral)   Ht 4' 10.5" (1.486 m)   Wt 170 lb (77.1 kg)   LMP 04/08/1996   SpO2 97%   BMI 34.93 kg/m   BP Readings from Last 3 Encounters:  09/28/18 124/74  09/17/18 122/84  06/05/18 124/76    Wt Readings from Last 3 Encounters:  09/28/18 170 lb (77.1 kg)  09/17/18 169 lb (76.7 kg)  06/05/18 166 lb 6.4 oz (75.5 kg)    Physical Exam Constitutional:      General: She is not in acute distress.    Appearance: She is well-developed.  HENT:     Head: Normocephalic.     Right Ear: External ear normal.     Left Ear: External ear normal.     Nose: Nose normal.  Eyes:     General:        Right eye: No discharge.        Left eye: No discharge.     Conjunctiva/sclera: Conjunctivae normal.     Pupils: Pupils are equal, round, and reactive to light.  Neck:     Musculoskeletal: Normal range of motion and neck supple.     Thyroid: No thyromegaly.     Vascular: No JVD.     Trachea: No tracheal deviation.  Cardiovascular:     Rate and Rhythm: Normal rate and regular rhythm.     Heart sounds: Normal heart sounds.  Pulmonary:     Effort: No respiratory distress.     Breath  sounds: No stridor. No wheezing.  Abdominal:     General: Bowel sounds are normal. There is no distension.     Palpations: Abdomen is soft. There is no mass.     Tenderness: There is no abdominal tenderness. There is no guarding or rebound.  Musculoskeletal:        General: Swelling present. No tenderness.  Lymphadenopathy:     Cervical: No cervical adenopathy.  Skin:    Findings: No erythema or rash.  Neurological:     Cranial Nerves: No cranial nerve deficit.     Motor: No abnormal muscle tone.     Coordination: Coordination normal.     Deep Tendon Reflexes: Reflexes normal.  Psychiatric:        Behavior: Behavior normal.        Thought Content: Thought content normal.        Judgment: Judgment normal.    L forehead scar Obese R shulder- pain w/ROM  Lab Results  Component Value Date    WBC 4.3 09/22/2018   HGB 12.0 09/22/2018   HCT 36.8 09/22/2018   PLT 174.0 09/22/2018   GLUCOSE 103 (H) 09/22/2018   CHOL 185 09/22/2018   TRIG 118.0 09/22/2018   HDL 41.10 09/22/2018   LDLDIRECT 154.9 05/01/2011   LDLCALC 120 (H) 09/22/2018   ALT 14 09/22/2018   AST 17 09/22/2018   NA 142 09/22/2018   K 4.2 09/22/2018   CL 106 09/22/2018   CREATININE 0.73 09/22/2018   BUN 22 09/22/2018   CO2 28 09/22/2018   TSH 1.00 09/22/2018   HGBA1C 5.9 11/20/2016    Dg Chest 2 View  Result Date: 10/22/2017 CLINICAL DATA:  85 year old female with shortness of breath for 1-2 weeks, cough and congestion. EXAM: CHEST - 2 VIEW COMPARISON:  High-resolution chest CT 01/24/2017. Chest radiographs 01/01/2017, and earlier. FINDINGS: Stable lung volumes and mediastinal contours. No pneumothorax, pulmonary edema, pleural effusion or confluent pulmonary opacity. Mild chronic increased interstitial markings appear stable. Stable visualized osseous structures. Negative visible bowel gas pattern. Stable cholecystectomy clips. IMPRESSION: No acute cardiopulmonary abnormality. Electronically Signed   By: Genevie Ann M.D.   On: 10/22/2017 11:21    Assessment & Plan:   There are no diagnoses linked to this encounter.   No orders of the defined types were placed in this encounter.    Follow-up: No follow-ups on file.  Walker Kehr, MD

## 2018-10-26 DIAGNOSIS — Z85828 Personal history of other malignant neoplasm of skin: Secondary | ICD-10-CM | POA: Diagnosis not present

## 2018-10-26 DIAGNOSIS — C4401 Basal cell carcinoma of skin of lip: Secondary | ICD-10-CM | POA: Diagnosis not present

## 2018-11-30 ENCOUNTER — Encounter: Payer: Self-pay | Admitting: Obstetrics & Gynecology

## 2018-11-30 DIAGNOSIS — Z1231 Encounter for screening mammogram for malignant neoplasm of breast: Secondary | ICD-10-CM | POA: Diagnosis not present

## 2018-11-30 DIAGNOSIS — Z803 Family history of malignant neoplasm of breast: Secondary | ICD-10-CM | POA: Diagnosis not present

## 2018-12-28 ENCOUNTER — Ambulatory Visit: Payer: Self-pay | Admitting: *Deleted

## 2018-12-28 NOTE — Telephone Encounter (Signed)
Pt scheduled  

## 2018-12-28 NOTE — Telephone Encounter (Signed)
Pt called with complaints of chest tightness, and her heart was racing; this started 12/27/2018 at 1900; her BP was 169/94 on 12/28/2018; her daughter and law took her BP with a home on her right and left arms; her chest discomfort continues from last PM, but she says that it has gotten a little better; recommendations made per nurse triage protocol; the pt verbalized understanding, but does not want to go to ED; she would like to come into the office for an EKG, and have her blood pressure checked; unable to reach [facility] [place] x 2; will route to office; the pt sees Dr Alain Marion,; the pt says that she will see any provider; she can be contacted at [phone] [facility]) or [phone] (Cell).   Reason for Disposition . [1] Chest pain (or "angina") comes and goes AND [2] is happening more often (increasing in frequency) or getting worse (increasing in severity)  Answer Assessment - Initial Assessment Questions 1. LOCATION: "Where does it hurt?"       Across chest above her breasts 2. RADIATION: "Does the pain go anywhere else?" (e.g., into neck, jaw, arms, back)     no 3. ONSET: "When did the chest pain begin?" (Minutes, hours or days)    12/27/2018 at 1900 4. PATTERN "Does the pain come and go, or has it been constant since it started?"  "Does it get worse with exertion?"      Constant; no 5. DURATION: "How long does it last" (e.g., seconds, minutes, hours)    hours 6. SEVERITY: "How bad is the pain?"  (e.g., Scale 1-10; mild, moderate, or severe)    - MILD (1-3): doesn't interfere with normal activities     - MODERATE (4-7): interferes with normal activities or awakens from sleep    - SEVERE (8-10): excruciating pain, unable to do any normal activities       Rated 6-7 out 10 on 12/27/2018, and 5 out of 10 12/28/2018 7. CARDIAC RISK FACTORS: "Do you have any history of heart problems or risk factors for heart disease?" (e.g., angina, prior heart attack; diabetes, high blood pressure, high cholesterol,  smoker, or strong family history of heart disease)   CAD 8. PULMONARY RISK FACTORS: "Do you have any history of lung disease?"  (e.g., blood clots in lung, asthma, emphysema, birth control pills)     History of pneumonia and bronchitis 9. CAUSE: "What do you think is causing the chest pain?"    Not sure 10. OTHER SYMPTOMS: "Do you have any other symptoms?" (e.g., dizziness, nausea, vomiting, sweating, fever, difficulty breathing, cough)     Heart rate increases with activity 11. PREGNANCY: "Is there any chance you are pregnant?" "When was your last menstrual period?" no  Protocols used: CHEST PAIN-A-AH

## 2018-12-28 NOTE — Telephone Encounter (Signed)
Please call pt to schedule appt

## 2018-12-29 ENCOUNTER — Other Ambulatory Visit: Payer: Self-pay

## 2018-12-29 ENCOUNTER — Other Ambulatory Visit (INDEPENDENT_AMBULATORY_CARE_PROVIDER_SITE_OTHER): Payer: Medicare Other

## 2018-12-29 ENCOUNTER — Ambulatory Visit (INDEPENDENT_AMBULATORY_CARE_PROVIDER_SITE_OTHER): Payer: Medicare Other | Admitting: Internal Medicine

## 2018-12-29 ENCOUNTER — Encounter: Payer: Self-pay | Admitting: Internal Medicine

## 2018-12-29 VITALS — BP 130/78 | HR 90 | Temp 97.7°F | Ht 58.5 in | Wt 169.0 lb

## 2018-12-29 DIAGNOSIS — I2510 Atherosclerotic heart disease of native coronary artery without angina pectoris: Secondary | ICD-10-CM

## 2018-12-29 DIAGNOSIS — R0789 Other chest pain: Secondary | ICD-10-CM

## 2018-12-29 DIAGNOSIS — I10 Essential (primary) hypertension: Secondary | ICD-10-CM | POA: Diagnosis not present

## 2018-12-29 DIAGNOSIS — R42 Dizziness and giddiness: Secondary | ICD-10-CM | POA: Diagnosis not present

## 2018-12-29 DIAGNOSIS — R002 Palpitations: Secondary | ICD-10-CM

## 2018-12-29 LAB — URINALYSIS, ROUTINE W REFLEX MICROSCOPIC
Bilirubin Urine: NEGATIVE
Hgb urine dipstick: NEGATIVE
Ketones, ur: NEGATIVE
Nitrite: NEGATIVE
RBC / HPF: NONE SEEN (ref 0–?)
Specific Gravity, Urine: >=1.03 — AB (ref 1.000–1.030)
Total Protein, Urine: NEGATIVE
Urine Glucose: NEGATIVE
Urobilinogen, UA: 0.2 (ref 0.0–1.0)
pH: 5.5 (ref 5.0–8.0)

## 2018-12-29 LAB — CBC WITH DIFFERENTIAL/PLATELET
Basophils Absolute: 0 10*3/uL (ref 0.0–0.1)
Basophils Relative: 0.6 % (ref 0.0–3.0)
Eosinophils Absolute: 0.1 10*3/uL (ref 0.0–0.7)
Eosinophils Relative: 2.7 % (ref 0.0–5.0)
HCT: 38 % (ref 36.0–46.0)
Hemoglobin: 12.3 g/dL (ref 12.0–15.0)
Lymphocytes Relative: 33.2 % (ref 12.0–46.0)
Lymphs Abs: 1.7 10*3/uL (ref 0.7–4.0)
MCHC: 32.2 g/dL (ref 30.0–36.0)
MCV: 86.7 fl (ref 78.0–100.0)
Monocytes Absolute: 0.5 10*3/uL (ref 0.1–1.0)
Monocytes Relative: 9.1 % (ref 3.0–12.0)
Neutro Abs: 2.7 10*3/uL (ref 1.4–7.7)
Neutrophils Relative %: 54.4 % (ref 43.0–77.0)
Platelets: 196 10*3/uL (ref 150.0–400.0)
RBC: 4.38 Mil/uL (ref 3.87–5.11)
RDW: 15 % (ref 11.5–15.5)
WBC: 5 10*3/uL (ref 4.0–10.5)

## 2018-12-29 LAB — TROPONIN I (HIGH SENSITIVITY): High Sens Troponin I: 4 ng/L (ref 2–17)

## 2018-12-29 LAB — HEPATIC FUNCTION PANEL
ALT: 11 U/L (ref 0–35)
AST: 16 U/L (ref 0–37)
Albumin: 3.8 g/dL (ref 3.5–5.2)
Alkaline Phosphatase: 80 U/L (ref 39–117)
Bilirubin, Direct: 0 mg/dL (ref 0.0–0.3)
Total Bilirubin: 0.3 mg/dL (ref 0.2–1.2)
Total Protein: 7.1 g/dL (ref 6.0–8.3)

## 2018-12-29 LAB — BASIC METABOLIC PANEL
BUN: 16 mg/dL (ref 6–23)
CO2: 29 mEq/L (ref 19–32)
Calcium: 9.4 mg/dL (ref 8.4–10.5)
Chloride: 106 mEq/L (ref 96–112)
Creatinine, Ser: 0.63 mg/dL (ref 0.40–1.20)
GFR: 89.63 mL/min (ref >60.00)
Glucose, Bld: 84 mg/dL (ref 70–99)
Potassium: 4.6 mEq/L (ref 3.5–5.1)
Sodium: 142 mEq/L (ref 135–145)

## 2018-12-29 LAB — TSH: TSH: 0.82 u[IU]/mL (ref 0.35–4.50)

## 2018-12-29 MED ORDER — NITROGLYCERIN 0.4 MG SL SUBL: 0.4000 mg | SUBLINGUAL_TABLET | SUBLINGUAL | 1 refills | Status: DC | PRN

## 2018-12-29 MED ORDER — METOPROLOL SUCCINATE ER 25 MG PO TB24: 12.5000 mg | ORAL_TABLET | Freq: Every day | ORAL | 11 refills | Status: DC

## 2018-12-29 NOTE — Assessment & Plan Note (Addendum)
Card ref ASA NTG prn Toprol XL low dose Labs EKG - ok Go to ER if sx's  relapsed

## 2018-12-29 NOTE — Progress Notes (Signed)
Subjective:  Patient ID: Diana Stout, female    DOB: 05/04/1930  Age: 85 y.o. MRN: [id number]  CC: No chief complaint on file.   HPI CAISYN BALTER presents for HTN - stopped BP meds >1 year ago C/o heart pounding on Sunday, chest tightness x 2.5 hrs --  - BP was 169/89 She was lightheaded on [place] nl today   Outpatient Medications Prior to Visit  Medication Sig Dispense Refill  . aspirin 81 MG tablet Take 81 mg by mouth daily.      . cholecalciferol (VITAMIN D) 1000 UNITS tablet Take 1,000 Units by mouth daily.    . cyanocobalamin 1000 MCG tablet Take 1,000 mcg by mouth daily.    . fluticasone (FLONASE) 50 MCG/ACT nasal spray Place 2 sprays into both nostrils as needed. 16 g 3  . furosemide (LASIX) 20 MG tablet TAKE 1 TABLET(20 MG) BY MOUTH DAILY AS NEEDED 90 tablet 3  . Magnesium 400 MG CAPS Take by mouth daily.    . potassium chloride (K-DUR) 10 MEQ tablet TAKE 1 TABLET(10 MEQ) BY MOUTH DAILY WITH FUROSEMIDE AS NEEDED 90 tablet 3  . Probiotic Product (ALIGN) 4 MG CAPS Take 1 capsule (4 mg total) by mouth daily. 30 capsule 0  . Respiratory Therapy Supplies (FLUTTER) DEVI Use as directed 1 each 0   No facility-administered medications prior to visit.     ROS: Review of Systems  Constitutional: Negative for activity change, appetite change, chills, fatigue and unexpected weight change.  HENT: Negative for congestion, mouth sores and sinus pressure.   Eyes: Negative for visual disturbance.  Respiratory: Negative for cough and chest tightness.   Gastrointestinal: Negative for abdominal pain and nausea.  Genitourinary: Negative for difficulty urinating, frequency and vaginal pain.  Musculoskeletal: Negative for back pain and gait problem.  Skin: Negative for pallor and rash.  Neurological: Negative for dizziness, tremors, weakness, numbness and headaches.  Psychiatric/Behavioral: Negative for confusion, sleep disturbance and suicidal ideas. The patient is not  nervous/anxious.     Objective:  BP 130/78 (BP Location: Left Arm, Patient Position: Sitting, Cuff Size: Large)   Pulse 90   Temp 97.7 F (36.5 C) (Oral)   Ht 4' 10.5" (1.486 m)   Wt 169 lb (76.7 kg)   LMP 04/08/1996   SpO2 95%   BMI 34.72 kg/m   BP Readings from Last 3 Encounters:  12/29/18 130/78  09/28/18 124/74  09/17/18 122/84    Wt Readings from Last 3 Encounters:  12/29/18 169 lb (76.7 kg)  09/28/18 170 lb (77.1 kg)  09/17/18 169 lb (76.7 kg)    Physical Exam Constitutional:      General: She is not in acute distress.    Appearance: She is well-developed. She is obese.  HENT:     Head: Normocephalic.     Right Ear: External ear normal.     Left Ear: External ear normal.     Nose: Nose normal.  Eyes:     General:        Right eye: No discharge.        Left eye: No discharge.     Conjunctiva/sclera: Conjunctivae normal.     Pupils: Pupils are equal, round, and reactive to light.  Neck:     Musculoskeletal: Normal range of motion and neck supple.     Thyroid: No thyromegaly.     Vascular: No JVD.     Trachea: No tracheal deviation.  Cardiovascular:     Rate  and Rhythm: Regular rhythm. Tachycardia present.     Heart sounds: Normal heart sounds.  Pulmonary:     Effort: No respiratory distress.     Breath sounds: No stridor. No wheezing.  Abdominal:     General: Bowel sounds are normal. There is no distension.     Palpations: Abdomen is soft. There is no mass.     Tenderness: There is no abdominal tenderness. There is no guarding or rebound.  Musculoskeletal:        General: No tenderness.  Lymphadenopathy:     Cervical: No cervical adenopathy.  Skin:    Findings: No erythema or rash.  Neurological:     Cranial Nerves: No cranial nerve deficit.     Motor: No abnormal muscle tone.     Coordination: Coordination normal.     Deep Tendon Reflexes: Reflexes normal.  Psychiatric:        Behavior: Behavior normal.        Thought Content: Thought content  normal.        Judgment: Judgment normal.   Procedure: EKG Indication: chest pressure, palpitations Impression: NSR. Rate 86. No acute changes.   Lab Results  Component Value Date   WBC 4.3 09/22/2018   HGB 12.0 09/22/2018   HCT 36.8 09/22/2018   PLT 174.0 09/22/2018   GLUCOSE 103 (H) 09/22/2018   CHOL 185 09/22/2018   TRIG 118.0 09/22/2018   HDL 41.10 09/22/2018   LDLDIRECT 154.9 05/01/2011   LDLCALC 120 (H) 09/22/2018   ALT 14 09/22/2018   AST 17 09/22/2018   NA 142 09/22/2018   K 4.2 09/22/2018   CL 106 09/22/2018   CREATININE 0.73 09/22/2018   BUN 22 09/22/2018   CO2 28 09/22/2018   TSH 1.00 09/22/2018   HGBA1C 5.9 11/20/2016    Dg Chest 2 View  Result Date: 10/22/2017 CLINICAL DATA:  86 year old female with shortness of breath for 1-2 weeks, cough and congestion. EXAM: CHEST - 2 VIEW COMPARISON:  High-resolution chest CT 01/24/2017. Chest radiographs 01/01/2017, and earlier. FINDINGS: Stable lung volumes and mediastinal contours. No pneumothorax, pulmonary edema, pleural effusion or confluent pulmonary opacity. Mild chronic increased interstitial markings appear stable. Stable visualized osseous structures. Negative visible bowel gas pattern. Stable cholecystectomy clips. IMPRESSION: No acute cardiopulmonary abnormality. Electronically Signed   By: Genevie Ann M.D.   On: 10/22/2017 11:21    Assessment & Plan:   There are no diagnoses linked to this encounter.   No orders of the defined types were placed in this encounter.    Follow-up: No follow-ups on file.  Walker Kehr, MD

## 2018-12-29 NOTE — Assessment & Plan Note (Signed)
Resolved Labs 

## 2018-12-29 NOTE — Assessment & Plan Note (Signed)
?  Costachondritis - unable to r/o angina EKG ok Toprol, NTG Labs

## 2018-12-29 NOTE — Assessment & Plan Note (Addendum)
stopped BP med >1 year ago  Toprol XL low dose - re-start

## 2018-12-30 ENCOUNTER — Other Ambulatory Visit: Payer: Medicare Other

## 2018-12-30 DIAGNOSIS — R8290 Unspecified abnormal findings in urine: Secondary | ICD-10-CM

## 2019-01-01 LAB — URINE CULTURE
MICRO NUMBER:: 913840
SPECIMEN QUALITY:: ADEQUATE

## 2019-01-05 NOTE — Progress Notes (Signed)
CARDIOLOGY CONSULT NOTE       Patient ID: Diana Stout MRN: [id number] DOB/AGE: Feb 27, 1931 85 y.o.  Admit date: (Not on file) Referring Physician: [name] Primary Physician: Diana Anger, MD Primary Cardiologist: new Reason for Consultation: Palpitations/ Chest Pain   Active Problems:   * No active hospital problems. *   HPI:  85 y.o. referred by Dr Diana Stout for chest pain and palpitations. 9/20 had pounding heart with chest tightness for 2.5 hours. BP was elevated some dizziness. Resolved and felt fine. Had been off BP meds for a year Toprol restarted on 9/22 by primary ECG reviewed from 9/22 normal sinus normal ST/T waves rate 86 bpm  Previous w/u with normal myovue March 2017 EF 68% Has had normal echo's in 2010 and 2016 Some bronchiectasis  Intolerant to lipitor and Zocor in past  She has not had recurrence of palpitations or chest pain She has gotten out of shape during COVID Some exertional dyspnea     ROS All other systems reviewed and negative except as noted above  Past Medical History:  Diagnosis Date  . Abdominal pain, lower 11/22/2010   2015 resolved off Zocor 5/17?urticaria w/GI upset: Dtr is allergic to seafood - the pt is on MegaRed krill oil - d/c MegaRed   . Abnormal chest CT 05/22/2011   Followed in Pulmonary clinic/ [facility]/ [place]  05/14/11 CT CHEST WITHOUT CONTRAST 1. Multiple small pulmonary nodules scattered throughout the lungs  bilaterally ranging in size from 3-6 mm.    2. There is a small hiatal hernia.  3. Atherosclerosis.  4. Status post cholecystectomy.  5. Low attenuation hepatic lesions unchanged compared to recent  abdominal CT scan, likely to represent cysts  . Acute pharyngitis 02/28/2007   Qualifier: Diagnosis of  By: Diana Bill MD, Diana Stout   . ANEMIA, NORMOCYTIC 08/10/2009   mild    . BRONCHITIS, ACUTE 06/12/2007   Qualifier: Diagnosis of  By: Plotnikov MD, Diana Stout Canker sore 01/01/2017   9/18   x1  . CAP (community  acquired pneumonia) 01/01/2017   9/18 probable L lung  . Chest pain, atypical 06/22/2014   06/22/14 ?etiology   . Cough 04/20/2007   Qualifier: Diagnosis of  By: Plotnikov MD, Diana Stout   . Diarrhea 07/19/2013   4/15 x 1 mo ?etiol 8/18  . DIVERTICULOSIS, COLON 03/08/2007   Qualifier: Diagnosis of  By: Diana Reichmann MD, Diana Stout   . Dizziness 07/19/2013   Likely due to diarrhea/meds 4/15   . DVT (deep venous thrombosis) ([place]) 1998   hx of right  . DVT, HX OF 02/02/2007   Qualifier: Diagnosis of  By: [name]    . Dyslipidemia 02/02/2007   Chronic 1/13 worse    . Dyspnea 07/01/2008   2/13 improved w/reg exercise 1/17 deconditioning - CL stress test was nl    . Dysuria 05/18/2013   2/15, 1/17   . Edema 09/27/2009   Qualifier: Diagnosis of  By: [name], MD, [name]   . ESOPHAGEAL STRICTURE 03/06/2007   Qualifier: Diagnosis of  By: Diana Reichmann MD, Diana Stout   . Esophagitis 2006  . Essential hypertension 03/08/2007   Chronic  On Benicar   . Fatigue 10/15/2011   2015 resolved off Zocor   . FOOT PAIN 07/25/2009   R foot OA    . GERD 02/02/2007   Wedge pillow Protonix prn, Pepcid - d/c  . GLUCOSE INTOLERANCE 03/06/2007   Qualifier: Diagnosis of  By: Diana Reichmann  MD, Diana Stout   . History of shingles   . Hyperlipidemia   . Hypoxemia 08/01/2009   Qualifier: Diagnosis of  By: Plotnikov MD, Diana Stout   . IBS 03/06/2007   Qualifier: Diagnosis of  By: Diana Reichmann MD, Diana Stout   . Impaired glucose tolerance 11/22/2010  . Kidney cyst, acquired 07/22/2016   Remote   . Kidney cysts    left- Dr Diana Stout  . Lung nodule 05/08/2011   04/15/11 RLL 4 mm nodule on abd CT - Urol office   . Multiple lung nodules 2012   seen on CT scan by Dr Diana Stout, pulmonologist  . Nausea alone 07/01/2008   2015 resolved off Zocor    . Obesity 01/03/2015  . Osteoporosis 02/02/2007   Chronic  Vit D, yoga  . Palpitations 05/12/2014   Dr [name] 2/16 poss due to steroid pack   . Pneumonia 12/2016  . Rash and nonspecific skin eruption 09/12/2015    5/17?urticaria Dtr is allergic to seafood - the pt is on MegaRed krill oil - d/c MegaRed   . Recurrent pneumonia 08/15/2009   Qualifier: Diagnosis of  By: Plotnikov MD, Diana Stout   . Right-sided thoracic back pain 05/12/2014   2/16 MSK   . SHINGLES, HX OF 03/06/2007   Qualifier: Diagnosis of  By: Diana Reichmann MD, Diana Stout   . SINUSITIS- ACUTE-NOS 03/06/2007   Qualifier: Diagnosis of  By: Diana Reichmann MD, Diana Stout   . SKIN RASH 08/10/2009   Qualifier: Diagnosis of  By: Plotnikov MD, Diana Stout   . Snoring 12/22/2013   Worse 9/15   . Upper airway cough syndrome 12/08/2014   Dr Diana Stout Flutter valve added 01/02/2015 > resolved 02/03/2015  - rec try off gerd rx p 03/09/15   . URI, acute 01/01/2011   9/12, 7/13, 1/14, 2/15, 9/16   . UTI 07/19/2008   Qualifier: Diagnosis of  By: Plotnikov MD, Diana Stout   . Wheezing 12/08/2014    Family History  Problem Relation Age of Onset  . Asthma Mother   . Diabetes Father   . Cancer Father        liver  . Cancer Sister        breast  . Diabetes Sister   . Asthma Sister   . Breast cancer Sister   . Diabetes Brother   . Asthma Brother   . Asthma Sister   . Diabetes Brother   . Coronary artery disease Other        1 st degree female relative  . Thyroid disease Maternal Uncle     Social History   Socioeconomic History  . Marital status: Widowed    Spouse name: Not on file  . Number of children: 3  . Years of education: Not on file  . Highest education level: Not on file  Occupational History  . Occupation: Retired    Fish farm manager: RETIRED  Social Needs  . Financial resource strain: Not on file  . Food insecurity    Worry: Not on file    Inability: Not on file  . Transportation needs    Medical: Not on file    Non-medical: Not on file  Tobacco Use  . Smoking status: Never Smoker  . Smokeless tobacco: Never Used  Substance and Sexual Activity  . Alcohol use: No  . Drug use: No  . Sexual activity: Not Currently  Lifestyle  . Physical activity    Days per week: Not  on file    Minutes per  session: Not on file  . Stress: Not on file  Relationships  . Social Herbalist on phone: Not on file    Gets together: Not on file    Attends religious service: Not on file    Active member of club or organization: Not on file    Attends meetings of clubs or organizations: Not on file    Relationship status: Not on file  . Intimate partner violence    Fear of current or ex partner: Not on file    Emotionally abused: Not on file    Physically abused: Not on file    Forced sexual activity: Not on file  Other Topics Concern  . Not on file  Social History Narrative  . Not on file    Past Surgical History:  Procedure Laterality Date  . APPENDECTOMY    . CHOLECYSTECTOMY    . HYSTEROSCOPY WITH RESECTOSCOPE  7/99   resect polyp  . MOHS SURGERY  05/2018   face  . TUBAL LIGATION Bilateral         Physical Exam: Blood pressure 126/68, pulse (!) 101, height 4' 11.5" (1.511 m), weight 168 lb 1.9 oz (76.3 kg), last menstrual period 04/08/1996, SpO2 94 %.   Affect appropriate Healthy:  appears stated age 68: normal Neck supple with no adenopathy JVP normal no bruits no thyromegaly Lungs clear with no wheezing and good diaphragmatic motion Heart:  S1/S2 no murmur, no rub, gallop or click PMI normal Abdomen: benighn, BS positve, no tenderness, no AAA no bruit.  No HSM or HJR Distal pulses intact with no bruits No edema Neuro non-focal Skin warm and dry No muscular weakness   Labs:   Lab Results  Component Value Date   WBC 5.0 12/29/2018   HGB 12.3 12/29/2018   HCT 38.0 12/29/2018   MCV 86.7 12/29/2018   PLT 196.0 12/29/2018    No results for input(s): NA, K, CL, CO2, BUN, CREATININE, CALCIUM, PROT, BILITOT, ALKPHOS, ALT, AST, GLUCOSE in the last 168 hours.  Invalid input(s): LABALBU Lab Results  Component Value Date   [id number] (H) 08/02/2009   CKMB 1.5 06/22/2014   TROPONINI 0.01        NO INDICATION OF MYOCARDIAL  INJURY. 08/02/2009    Lab Results  Component Value Date   CHOL 185 09/22/2018   CHOL 218 (H) 11/04/2017   CHOL 245 (H) 07/15/2016   Lab Results  Component Value Date   HDL 41.10 09/22/2018   HDL 51.80 11/04/2017   HDL 47.80 07/15/2016   Lab Results  Component Value Date   LDLCALC 120 (H) 09/22/2018   LDLCALC 148 (H) 11/04/2017   LDLCALC 173 (H) 07/15/2016   Lab Results  Component Value Date   TRIG 118.0 09/22/2018   TRIG 88.0 11/04/2017   TRIG 121.0 07/15/2016   Lab Results  Component Value Date   CHOLHDL 4 09/22/2018   CHOLHDL 4 11/04/2017   CHOLHDL 5 07/15/2016   Lab Results  Component Value Date   LDLDIRECT 154.9 05/01/2011      Radiology: No results found.  EKG: 12/29/18 NSR normal ECG     ASSESSMENT AND PLAN:   1.  Palpitations:  Likely self limited episode of PSVT Continue Toprol 2 week monitor to r/o SVT and PAF  2. Chest pain:  In setting of palpitations Normal ECG and previously normal myovue x 3 most recent March 2017 no indication for stress testing currently  3. HTN:  Continue diuretic  and beta blocker  4. Pulmonary:  Bronchiectasis no recent pneumonia Flu shot f/u pulmonary   Will see in f/u after monitor to decide if further testing needed   Signed: Jenkins Rouge 01/06/2019, 4:39 PM

## 2019-01-06 ENCOUNTER — Ambulatory Visit (INDEPENDENT_AMBULATORY_CARE_PROVIDER_SITE_OTHER): Payer: Medicare Other | Admitting: Cardiovascular Disease

## 2019-01-06 ENCOUNTER — Other Ambulatory Visit: Payer: Self-pay

## 2019-01-06 ENCOUNTER — Encounter: Payer: Self-pay | Admitting: Cardiovascular Disease

## 2019-01-06 VITALS — BP 126/68 | HR 101 | Ht 59.5 in | Wt 168.1 lb

## 2019-01-06 DIAGNOSIS — R002 Palpitations: Secondary | ICD-10-CM

## 2019-01-06 NOTE — Patient Instructions (Signed)
Medication Instructions:   If you need a refill on your cardiac medications before your next appointment, please call your pharmacy.   Lab work:  If you have labs (blood work) drawn today and your tests are completely normal, you will receive your results only by: Marland Kitchen MyChart Message (if you have MyChart) OR . A paper copy in the mail If you have any lab test that is abnormal or we need to change your treatment, we will call you to review the results.  Testing/Procedures: Your physician has recommended that you wear a Zio patch monitor for 2 weeks. Monitors are medical devices that record the heart's electrical activity. Doctors most often [place] these monitors to diagnose arrhythmias. Arrhythmias are problems with the speed or rhythm of the heartbeat. The monitor is a small, portable device. You can wear one while you do your normal daily activities. This is usually used to diagnose what is causing palpitations/syncope (passing out).   Follow-Up: At [facility], you and your health needs are our priority.  As part of our continuing mission to provide you with exceptional heart care, we have created designated Provider Care Teams.  These Care Teams include your primary Cardiologist (physician) and Advanced Practice Providers (APPs -  Physician Assistants and Nurse Practitioners) who all work together to provide you with the care you need, when you need it. You will need a follow up appointment in 4 weeks.  You may see Dr. [name] or one of the following Advanced Practice Providers on your designated Care Team:   Truitt Merle, NP Cecilie Kicks, NP . Kathyrn Drown, NP

## 2019-01-08 DIAGNOSIS — Z23 Encounter for immunization: Secondary | ICD-10-CM | POA: Diagnosis not present

## 2019-01-09 ENCOUNTER — Other Ambulatory Visit: Payer: Self-pay

## 2019-01-09 ENCOUNTER — Ambulatory Visit (INDEPENDENT_AMBULATORY_CARE_PROVIDER_SITE_OTHER): Payer: Medicare Other | Admitting: Family Medicine

## 2019-01-09 DIAGNOSIS — N3000 Acute cystitis without hematuria: Secondary | ICD-10-CM | POA: Diagnosis not present

## 2019-01-09 MED ORDER — SULFAMETHOXAZOLE-TRIMETHOPRIM 800-160 MG PO TABS: 1.0000 | ORAL_TABLET | Freq: Two times a day (BID) | ORAL | 0 refills | Status: DC

## 2019-01-09 NOTE — Progress Notes (Signed)
Virtual Visit via Telephone Note  I connected with Diana Stout  on 01/09/19 at 10:20 AM EDT by telephone and verified that I am speaking with the correct person using two identifiers.   I discussed the limitations, risks, security and privacy concerns of performing an evaluation and management service by telephone and the availability of in person appointments. I also discussed with the patient that there may be a patient responsible charge related to this service. The patient expressed understanding and agreed to proceed.  Location patient: home Location provider: work office Participants present for the call: patient, provider Patient did not have a visit in the prior 7 days to address this/these issue(s).   History of Present Illness: Has started with dysuria started 2 days ago. Increased urge, increase frequency. Just had UA done about 10 days ago that was normal, but she has had UTIs with some frequency in past. Does well with abx typically.  No fevers, chills. No abd pain. Has some right hip pain, but this is her normal. No flank pain.    Observations/Objective: Patient sounds cheerful and well on the phone. I do not appreciate any SOB. Speech and thought processing are grossly intact. Patient reported vitals:  Assessment and Plan: 1. Acute cystitis without hematuria Based on previous tolerance to medication and last 2+ urine cultures, we selected Bactrim for treatment today.  I have encouraged her to let us know if she has any worsening of symptoms or if she is not feeling significant improvement on Monday morning for her to follow-up with her regular provider.  - sulfamethoxazole-trimethoprim (BACTRIM DS) 800-160 MG tablet; Take 1 tablet by mouth 2 (two) times daily.  Dispense: 10 tablet; Refill: 0   Follow Up Instructions:  Return if symptoms worsen or fail to improve.    [id number] [id number] [id number] 11-20 [id number] 21-30 I did not refer this patient for an OV in the next 24 hours  for this/these issue(s).  I discussed the assessment and treatment plan with the patient. The patient was provided an opportunity to ask questions and all were answered. The patient agreed with the plan and demonstrated an understanding of the instructions.   The patient was advised to call back or seek an in-person evaluation if the symptoms worsen or if the condition fails to improve as anticipated.  I provided 12 minutes of non-face-to-face time during this encounter.   Micheline Rough, MD

## 2019-01-13 ENCOUNTER — Telehealth: Payer: Self-pay

## 2019-01-13 NOTE — Telephone Encounter (Signed)
Spoke to pt. Gave her instructions on the monitor. Verified address. 14 day Zio ordered to be mailed to pt's home.   Pt would like a call back as soon as we receive the results.

## 2019-01-22 NOTE — Telephone Encounter (Signed)
Patient is calling to check on status, she would like a call back.

## 2019-01-22 NOTE — Telephone Encounter (Signed)
Follow Up  Patient is calling back in again due to not receiving the monitor yet. Please give patient a call back to discuss today.

## 2019-01-25 NOTE — Telephone Encounter (Signed)
I called pt. She still has not received her ZIO monitor. She will come by the office after lunch today and pick up one. I will register the new serial # to her. She was advised to wear it for 14 days.

## 2019-01-26 ENCOUNTER — Telehealth: Payer: Self-pay | Admitting: Cardiovascular Disease

## 2019-01-26 NOTE — Telephone Encounter (Signed)
Patient scheduled to come into office 01/27/19 at 12:00PM to have ZIO patch monitor applied.

## 2019-01-26 NOTE — Telephone Encounter (Signed)
Patient would like someone from the office to help her put on her heart monitor. She tried to follow the instructions herself but had a hard time. She does not have any friends or relatives nearby that could help her put the monitor on. Please discuss options with the patient.

## 2019-01-27 ENCOUNTER — Other Ambulatory Visit (INDEPENDENT_AMBULATORY_CARE_PROVIDER_SITE_OTHER): Payer: Medicare Other

## 2019-01-27 ENCOUNTER — Other Ambulatory Visit: Payer: Self-pay

## 2019-01-27 DIAGNOSIS — R002 Palpitations: Secondary | ICD-10-CM

## 2019-02-15 ENCOUNTER — Ambulatory Visit: Payer: Medicare Other | Admitting: Cardiovascular Disease

## 2019-02-19 NOTE — Progress Notes (Signed)
CARDIOLOGY CONSULT NOTE       Patient ID: Diana Stout MRN: [id number] DOB/AGE: [birth year] 85 y.o.  Admit date: (Not on file) Referring Physician: [name] Primary Physician: [name], MD Primary Cardiologist: [name] Reason for Consultation: Palpitations/ Chest Pain     HPI:  85 y.o. referred by Dr [name] 01/06/19  for chest pain and palpitations. 9/20 had pounding heart with chest tightness for 2.5 hours. BP was elevated some dizziness. Resolved and felt fine. Had been off BP meds for a year Toprol restarted on 9/22 by primary ECG reviewed from 9/22 normal sinus normal ST/T waves rate 86 bpm  Previous w/u with normal myovue March 2017 EF 68% Has had normal echo's in 2010 and 2016 Some bronchiectasis  Intolerant to lipitor and Zocor in past  She has not had recurrence of palpitations or chest pain She has gotten out of shape during COVID Some exertional dyspnea   Monitor reviewed from 01/28/19  Average HR 80 PAC;s short bursts <8 beats atrial tachycardia    ROS All other systems reviewed and negative except as noted above  Past Medical History:  Diagnosis Date  . Abdominal pain, lower 11/22/2010   2015 resolved off Zocor 5/17?urticaria w/GI upset: Dtr is allergic to seafood - the pt is on MegaRed krill oil - d/c MegaRed   . Abnormal chest CT 05/22/2011   Followed in Pulmonary clinic/ [facility]/ [place]  05/14/11 CT CHEST WITHOUT CONTRAST 1. Multiple small pulmonary nodules scattered throughout the lungs  bilaterally ranging in size from 3-6 mm.    2. There is a small hiatal hernia.  3. Atherosclerosis.  4. Status post cholecystectomy.  5. Low attenuation hepatic lesions unchanged compared to recent  abdominal CT scan, likely to represent cysts  . Acute pharyngitis 02/28/2007   Qualifier: Diagnosis of  By: [name] MD, [name]   . ANEMIA, NORMOCYTIC 08/10/2009   mild    . BRONCHITIS, ACUTE 06/12/2007   Qualifier: Diagnosis of  By: Plotnikov MD, [name]  Canker sore 01/01/2017   9/18   x1  . CAP (community acquired pneumonia) 01/01/2017   9/18 probable L lung  . Chest pain, atypical 06/22/2014   06/22/14 ?etiology   . Cough 04/20/2007   Qualifier: Diagnosis of  By: Plotnikov MD, [name]   . Diarrhea 07/19/2013   4/15 x 1 mo ?etiol 8/18  . DIVERTICULOSIS, COLON 03/08/2007   Qualifier: Diagnosis of  By: [name] MD, [name]   . Dizziness 07/19/2013   Likely due to diarrhea/meds 4/15   . DVT (deep venous thrombosis) ([place]) 1998   hx of right  . DVT, HX OF 02/02/2007   Qualifier: Diagnosis of  By: [name]    . Dyslipidemia 02/02/2007   Chronic 1/13 worse    . Dyspnea 07/01/2008   2/13 improved w/reg exercise 1/17 deconditioning - CL stress test was nl    . Dysuria 05/18/2013   2/15, 1/17   . Edema 09/27/2009   Qualifier: Diagnosis of  By: [name], MD, [name]   . ESOPHAGEAL STRICTURE 03/06/2007   Qualifier: Diagnosis of  By: [name] MD, [name]   . Esophagitis 2006  . Essential hypertension 03/08/2007   Chronic  On Benicar   . Fatigue 10/15/2011   2015 resolved off Zocor   . FOOT PAIN 07/25/2009   R foot OA    . GERD 02/02/2007   Wedge pillow Protonix prn, Pepcid - d/c  . GLUCOSE INTOLERANCE 03/06/2007  Qualifier: Diagnosis of  By: [name] MD, [name]   . History of shingles   . Hyperlipidemia   . Hypoxemia 08/01/2009   Qualifier: Diagnosis of  By: Plotnikov MD, [name]   . IBS 03/06/2007   Qualifier: Diagnosis of  By: [name] MD, [name]   . Impaired glucose tolerance 11/22/2010  . Kidney cyst, acquired 07/22/2016   Remote   . Kidney cysts    left- Dr [name]  . Lung nodule 05/08/2011   04/15/11 RLL 4 mm nodule on abd CT - Urol office   . Multiple lung nodules 2012   seen on CT scan by Dr [name], pulmonologist  . Nausea alone 07/01/2008   2015 resolved off Zocor    . Obesity 01/03/2015  . Osteoporosis 02/02/2007   Chronic  Vit D, yoga  . Palpitations 05/12/2014   Dr [name] 2/16 poss due to steroid pack   . Pneumonia  12/2016  . Rash and nonspecific skin eruption 09/12/2015   5/17?urticaria Dtr is allergic to seafood - the pt is on MegaRed krill oil - d/c MegaRed   . Recurrent pneumonia 08/15/2009   Qualifier: Diagnosis of  By: Plotnikov MD, [name]   . Right-sided thoracic back pain 05/12/2014   2/16 MSK   . SHINGLES, HX OF 03/06/2007   Qualifier: Diagnosis of  By: [name] MD, [name]   . SINUSITIS- ACUTE-NOS 03/06/2007   Qualifier: Diagnosis of  By: [name] MD, [name]   . SKIN RASH 08/10/2009   Qualifier: Diagnosis of  By: Plotnikov MD, [name]   . Snoring 12/22/2013   Worse 9/15   . Upper airway cough syndrome 12/08/2014   Dr [name] Flutter valve added 01/02/2015 > resolved 02/03/2015  - rec try off gerd rx p 03/09/15   . URI, acute 01/01/2011   9/12, 7/13, 1/14, 2/15, 9/16   . UTI 07/19/2008   Qualifier: Diagnosis of  By: Plotnikov MD, [name]   . Wheezing 12/08/2014    Family History  Problem Relation Age of Onset  . Asthma Mother   . Diabetes Father   . Cancer Father        liver  . Cancer Sister        breast  . Diabetes Sister   . Asthma Sister   . Breast cancer Sister   . Diabetes Brother   . Asthma Brother   . Asthma Sister   . Diabetes Brother   . Coronary artery disease Other        1 st degree female relative  . Thyroid disease Maternal Uncle     Social History   Socioeconomic History  . Marital status: Widowed    Spouse name: Not on file  . Number of children: 3  . Years of education: Not on file  . Highest education level: Not on file  Occupational History  . Occupation: Retired    Fish farm manager: RETIRED  Social Needs  . Financial resource strain: Not on file  . Food insecurity    Worry: Not on file    Inability: Not on file  . Transportation needs    Medical: Not on file    Non-medical: Not on file  Tobacco Use  . Smoking status: Never Smoker  . Smokeless tobacco: Never Used  Substance and Sexual Activity  . Alcohol use: No  . Drug use: No  . Sexual activity: Not  Currently  Lifestyle  . Physical activity    Days per week: Not on  file    Minutes per session: Not on file  . Stress: Not on file  Relationships  . Social Herbalist on phone: Not on file    Gets together: Not on file    Attends religious service: Not on file    Active member of club or organization: Not on file    Attends meetings of clubs or organizations: Not on file    Relationship status: Not on file  . Intimate partner violence    Fear of current or ex partner: Not on file    Emotionally abused: Not on file    Physically abused: Not on file    Forced sexual activity: Not on file  Other Topics Concern  . Not on file  Social History Narrative  . Not on file    Past Surgical History:  Procedure Laterality Date  . APPENDECTOMY    . CHOLECYSTECTOMY    . HYSTEROSCOPY WITH RESECTOSCOPE  7/99   resect polyp  . MOHS SURGERY  05/2018   face  . TUBAL LIGATION Bilateral         Physical Exam: Blood pressure 128/68, pulse 73, height 4' 11.5" (1.511 m), weight 168 lb (76.2 kg), last menstrual period 04/08/1996, SpO2 97 %.   Affect appropriate Healthy:  appears stated age 32: normal Neck supple with no adenopathy JVP normal no bruits no thyromegaly Lungs clear with no wheezing and good diaphragmatic motion Heart:  S1/S2 no murmur, no rub, gallop or click PMI normal Abdomen: benighn, BS positve, no tenderness, no AAA no bruit.  No HSM or HJR Distal pulses intact with no bruits No edema Neuro non-focal Skin warm and dry No muscular weakness   Labs:   Lab Results  Component Value Date   WBC 5.0 12/29/2018   HGB 12.3 12/29/2018   HCT 38.0 12/29/2018   MCV 86.7 12/29/2018   PLT 196.0 12/29/2018    No results for input(s): NA, K, CL, CO2, BUN, CREATININE, CALCIUM, PROT, BILITOT, ALKPHOS, ALT, AST, GLUCOSE in the last 168 hours.  Invalid input(s): LABALBU Lab Results  Component Value Date   [id number] (H) 08/02/2009   CKMB 1.5 06/22/2014    TROPONINI 0.01        NO INDICATION OF MYOCARDIAL INJURY. 08/02/2009    Lab Results  Component Value Date   CHOL 185 09/22/2018   CHOL 218 (H) 11/04/2017   CHOL 245 (H) 07/15/2016   Lab Results  Component Value Date   HDL 41.10 09/22/2018   HDL 51.80 11/04/2017   HDL 47.80 07/15/2016   Lab Results  Component Value Date   LDLCALC 120 (H) 09/22/2018   LDLCALC 148 (H) 11/04/2017   LDLCALC 173 (H) 07/15/2016   Lab Results  Component Value Date   TRIG 118.0 09/22/2018   TRIG 88.0 11/04/2017   TRIG 121.0 07/15/2016   Lab Results  Component Value Date   CHOLHDL 4 09/22/2018   CHOLHDL 4 11/04/2017   CHOLHDL 5 07/15/2016   Lab Results  Component Value Date   LDLDIRECT 154.9 05/01/2011      Radiology: No results found.  EKG: 12/29/18 NSR normal ECG     ASSESSMENT AND PLAN:   1.  Palpitations:  Likely self limited episode of PSVT Continue Toprol Monitor reviewed and no SVT/PAF or serious arrhythmia 2. Chest pain:  In setting of palpitations Normal ECG and previously normal myovue x 3 most recent March 2017 no indication for stress testing currently  3. HTN:  Continue diuretic and beta blocker  4. Pulmonary:  Bronchiectasis no recent pneumonia Flu shot f/u pulmonary   F/U in a year   Signed: Jenkins Rouge 02/25/2019, 8:29 AM

## 2019-02-22 ENCOUNTER — Telehealth: Payer: Self-pay | Admitting: Cardiovascular Disease

## 2019-02-22 NOTE — Telephone Encounter (Signed)
According to [employer], they started processing [name] monitor on 02/20/2019.  [employer] will usually post results within 48 hours from start of processing.  Patient was concern her results would be available for her Thursday appointment with Dr.Nishan. I assured patient her results should be available by the end of the day today.

## 2019-02-22 NOTE — Telephone Encounter (Signed)
The pt called to see someone received the information from the [place] X[employer] monitor. Can someone please give her a call ASAP. She feels like she been getting the run around and would like some information. I told her I will send a phone note to the appropriate people to give her a call back.

## 2019-02-22 NOTE — Telephone Encounter (Signed)
New Message:     Pt wants to know if her Monitor results are ready, she have an appointment on Thursday with Dr [name]. She said she wanted to make sure he had them before she saw him on Thursday.,

## 2019-02-24 DIAGNOSIS — R002 Palpitations: Secondary | ICD-10-CM | POA: Diagnosis not present

## 2019-02-25 ENCOUNTER — Encounter: Payer: Self-pay | Admitting: Cardiovascular Disease

## 2019-02-25 ENCOUNTER — Ambulatory Visit (INDEPENDENT_AMBULATORY_CARE_PROVIDER_SITE_OTHER): Payer: Medicare Other | Admitting: Cardiovascular Disease

## 2019-02-25 ENCOUNTER — Other Ambulatory Visit: Payer: Self-pay

## 2019-02-25 VITALS — BP 128/68 | HR 73 | Ht 59.5 in | Wt 168.0 lb

## 2019-02-25 DIAGNOSIS — I2510 Atherosclerotic heart disease of native coronary artery without angina pectoris: Secondary | ICD-10-CM | POA: Diagnosis not present

## 2019-02-25 DIAGNOSIS — R002 Palpitations: Secondary | ICD-10-CM | POA: Diagnosis not present

## 2019-02-25 NOTE — Patient Instructions (Addendum)
Your physician recommends that you continue on your current medications as directed. Please refer to the Current Medication list given to you today.   Your physician wants you to follow-up in:  6 MONTHS WITH DR NISHAN  You will receive a reminder letter in the mail two months in advance. If you don't receive a letter, please call our office to schedule the follow-up appointment. 

## 2019-04-05 ENCOUNTER — Telehealth: Payer: Self-pay

## 2019-04-06 ENCOUNTER — Other Ambulatory Visit: Payer: Self-pay

## 2019-04-06 ENCOUNTER — Ambulatory Visit (INDEPENDENT_AMBULATORY_CARE_PROVIDER_SITE_OTHER): Payer: Medicare Other | Admitting: Internal Medicine

## 2019-04-06 ENCOUNTER — Encounter: Payer: Self-pay | Admitting: Internal Medicine

## 2019-04-06 DIAGNOSIS — R002 Palpitations: Secondary | ICD-10-CM

## 2019-04-06 DIAGNOSIS — R300 Dysuria: Secondary | ICD-10-CM

## 2019-04-06 DIAGNOSIS — N3000 Acute cystitis without hematuria: Secondary | ICD-10-CM

## 2019-04-06 DIAGNOSIS — I2510 Atherosclerotic heart disease of native coronary artery without angina pectoris: Secondary | ICD-10-CM | POA: Diagnosis not present

## 2019-04-06 DIAGNOSIS — H9193 Unspecified hearing loss, bilateral: Secondary | ICD-10-CM

## 2019-04-06 DIAGNOSIS — I10 Essential (primary) hypertension: Secondary | ICD-10-CM

## 2019-04-06 LAB — URINALYSIS, ROUTINE W REFLEX MICROSCOPIC
Bilirubin Urine: NEGATIVE
Hgb urine dipstick: NEGATIVE
Ketones, ur: NEGATIVE
Nitrite: NEGATIVE
RBC / HPF: NONE SEEN (ref 0–?)
Specific Gravity, Urine: 1.025 (ref 1.000–1.030)
Total Protein, Urine: NEGATIVE
Urine Glucose: NEGATIVE
Urobilinogen, UA: 0.2 (ref 0.0–1.0)
pH: 5.5 (ref 5.0–8.0)

## 2019-04-06 MED ORDER — FLUTICASONE PROPIONATE 50 MCG/ACT NA SUSP: 2.0000 | NASAL | 11 refills | Status: DC | PRN

## 2019-04-06 MED ORDER — SULFAMETHOXAZOLE-TRIMETHOPRIM 800-160 MG PO TABS: 1.0000 | ORAL_TABLET | Freq: Two times a day (BID) | ORAL | 0 refills | Status: DC

## 2019-04-06 MED ORDER — METOPROLOL SUCCINATE ER 25 MG PO TB24: 12.5000 mg | ORAL_TABLET | Freq: Every day | ORAL | 3 refills | Status: DC

## 2019-04-06 NOTE — Assessment & Plan Note (Signed)
Toprol XL low dose 

## 2019-04-06 NOTE — Progress Notes (Signed)
Subjective:  Patient ID: Diana Stout, female    DOB: 05/29/1930  Age: 85 y.o. MRN: [id number]  CC: No chief complaint on file.   HPI Diana Stout presents for dysuria - recurrent C/o hearing loss C/o elevated BP at times  Outpatient Medications Prior to Visit  Medication Sig Dispense Refill  . aspirin 81 MG tablet Take 81 mg by mouth daily.      . cholecalciferol (VITAMIN D) 1000 UNITS tablet Take 1,000 Units by mouth daily.    . cyanocobalamin 1000 MCG tablet Take 1,000 mcg by mouth daily.    . fluticasone (FLONASE) 50 MCG/ACT nasal spray Place 2 sprays into both nostrils as needed. 16 g 3  . furosemide (LASIX) 20 MG tablet TAKE 1 TABLET(20 MG) BY MOUTH DAILY AS NEEDED 90 tablet 3  . Magnesium 400 MG CAPS Take by mouth daily.    . metoprolol succinate (TOPROL-XL) 25 MG 24 hr tablet Take 12.5 mg by mouth daily.     . nitroGLYCERIN (NITROSTAT) 0.4 MG SL tablet Place 1 tablet (0.4 mg total) under the tongue every 5 (five) minutes as needed for chest pain. 20 tablet 1  . potassium chloride (K-DUR) 10 MEQ tablet TAKE 1 TABLET(10 MEQ) BY MOUTH DAILY WITH FUROSEMIDE AS NEEDED 90 tablet 3  . Probiotic Product (ALIGN) 4 MG CAPS Take 1 capsule (4 mg total) by mouth daily. 30 capsule 0  . Red Yeast Rice Extract (RED YEAST RICE PO) Take 2 each by mouth daily.    Marland Kitchen Respiratory Therapy Supplies (FLUTTER) DEVI Use as directed 1 each 0  . sulfamethoxazole-trimethoprim (BACTRIM DS) 800-160 MG tablet Take 1 tablet by mouth 2 (two) times daily. (Patient not taking: Reported on 04/06/2019) 10 tablet 0   No facility-administered medications prior to visit.    ROS: Review of Systems  Constitutional: Negative for activity change, appetite change, chills, fatigue and unexpected weight change.  HENT: Negative for congestion, mouth sores and sinus pressure.   Eyes: Negative for visual disturbance.  Respiratory: Negative for cough and chest tightness.   Gastrointestinal: Negative for abdominal  pain and nausea.  Genitourinary: Positive for dysuria and frequency. Negative for difficulty urinating and vaginal pain.  Musculoskeletal: Negative for back pain and gait problem.  Skin: Negative for pallor and rash.  Neurological: Negative for dizziness, tremors, weakness, numbness and headaches.  Psychiatric/Behavioral: Negative for confusion, sleep disturbance and suicidal ideas.    Objective:  BP 136/80 (BP Location: Left Arm, Patient Position: Sitting, Cuff Size: Large)   Pulse 70   Temp 97.8 F (36.6 C) (Oral)   Ht 4' 11.5" (1.511 m)   Wt 168 lb (76.2 kg)   LMP 04/08/1996   SpO2 96%   BMI 33.36 kg/m   BP Readings from Last 3 Encounters:  04/06/19 136/80  02/25/19 128/68  01/06/19 126/68    Wt Readings from Last 3 Encounters:  04/06/19 168 lb (76.2 kg)  02/25/19 168 lb (76.2 kg)  01/06/19 168 lb 1.9 oz (76.3 kg)    Physical Exam Constitutional:      General: She is not in acute distress.    Appearance: She is well-developed. She is obese.  HENT:     Head: Normocephalic.     Right Ear: External ear normal.     Left Ear: External ear normal.     Nose: Nose normal.  Eyes:     General:        Right eye: No discharge.  Left eye: No discharge.     Conjunctiva/sclera: Conjunctivae normal.     Pupils: Pupils are equal, round, and reactive to light.  Neck:     Thyroid: No thyromegaly.     Vascular: No JVD.     Trachea: No tracheal deviation.  Cardiovascular:     Rate and Rhythm: Normal rate and regular rhythm.     Heart sounds: Normal heart sounds.  Pulmonary:     Effort: No respiratory distress.     Breath sounds: No stridor. No wheezing.  Abdominal:     General: Bowel sounds are normal. There is no distension.     Palpations: Abdomen is soft. There is no mass.     Tenderness: There is no abdominal tenderness. There is no guarding or rebound.  Musculoskeletal:        General: No tenderness.     Cervical back: Normal range of motion and neck supple.   Lymphadenopathy:     Cervical: No cervical adenopathy.  Skin:    Findings: No erythema or rash.  Neurological:     Cranial Nerves: No cranial nerve deficit.     Motor: No abnormal muscle tone.     Coordination: Coordination normal.     Deep Tendon Reflexes: Reflexes normal.  Psychiatric:        Behavior: Behavior normal.        Thought Content: Thought content normal.        Judgment: Judgment normal.     Lab Results  Component Value Date   WBC 5.0 12/29/2018   HGB 12.3 12/29/2018   HCT 38.0 12/29/2018   PLT 196.0 12/29/2018   GLUCOSE 84 12/29/2018   CHOL 185 09/22/2018   TRIG 118.0 09/22/2018   HDL 41.10 09/22/2018   LDLDIRECT 154.9 05/01/2011   LDLCALC 120 (H) 09/22/2018   ALT 11 12/29/2018   AST 16 12/29/2018   NA 142 12/29/2018   K 4.6 12/29/2018   CL 106 12/29/2018   CREATININE 0.63 12/29/2018   BUN 16 12/29/2018   CO2 29 12/29/2018   TSH 0.82 12/29/2018   HGBA1C 5.9 11/20/2016    DG Chest 2 View  Result Date: 10/22/2017 CLINICAL DATA:  85 year old female with shortness of breath for 1-2 weeks, cough and congestion. EXAM: CHEST - 2 VIEW COMPARISON:  High-resolution chest CT 01/24/2017. Chest radiographs 01/01/2017, and earlier. FINDINGS: Stable lung volumes and mediastinal contours. No pneumothorax, pulmonary edema, pleural effusion or confluent pulmonary opacity. Mild chronic increased interstitial markings appear stable. Stable visualized osseous structures. Negative visible bowel gas pattern. Stable cholecystectomy clips. IMPRESSION: No acute cardiopulmonary abnormality. Electronically Signed   By: Genevie Ann M.D.   On: 10/22/2017 11:21    Assessment & Plan:   There are no diagnoses linked to this encounter.   No orders of the defined types were placed in this encounter.    Follow-up: No follow-ups on file.  Walker Kehr, MD

## 2019-04-06 NOTE — Assessment & Plan Note (Signed)
Resolved Cont w/Toprol

## 2019-04-06 NOTE — Assessment & Plan Note (Signed)
UA, Cx Bactrim if needed

## 2019-04-08 LAB — CULTURE, URINE COMPREHENSIVE

## 2019-04-12 ENCOUNTER — Telehealth: Payer: Self-pay | Admitting: Internal Medicine

## 2019-04-12 NOTE — Telephone Encounter (Signed)
Patient is calling regarding results for her Urinalysis Cb- [phone]

## 2019-04-14 NOTE — Telephone Encounter (Signed)
Please advise about urine culture. 

## 2019-04-16 NOTE — Telephone Encounter (Signed)
It is a combination with normal vaginal flora.  Thanks

## 2019-04-21 NOTE — Telephone Encounter (Signed)
LM notifying pt

## 2019-05-02 IMAGING — DX DG CHEST 2V
2 series · 2 of 2 positions shown · non-contrast
Comparison: 09/02/2015

CLINICAL DATA: Cough, congestion, sob, X 5 days, pt on ATB, x 4
days, HTN, hx of lung nodules.

EXAM:
CHEST  2 VIEW

[chest pa]
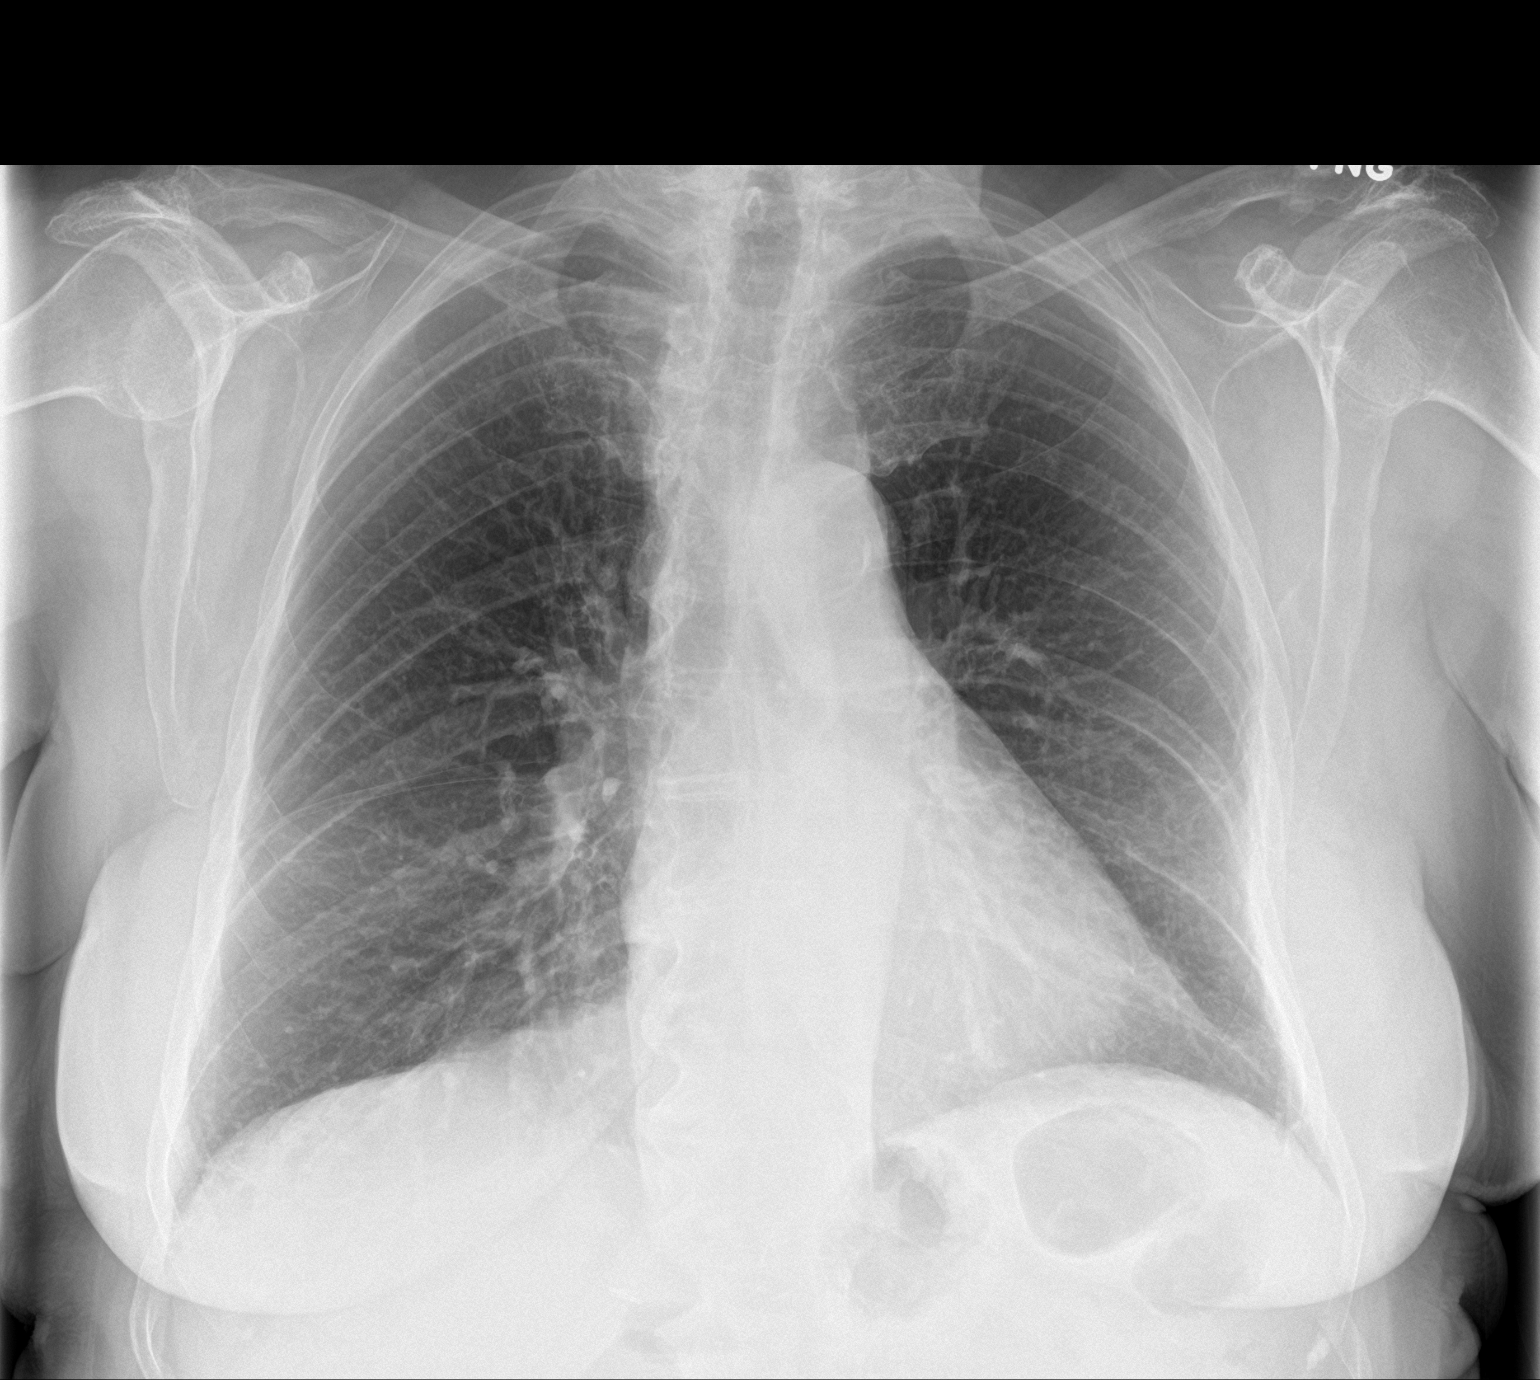

[chest lat]
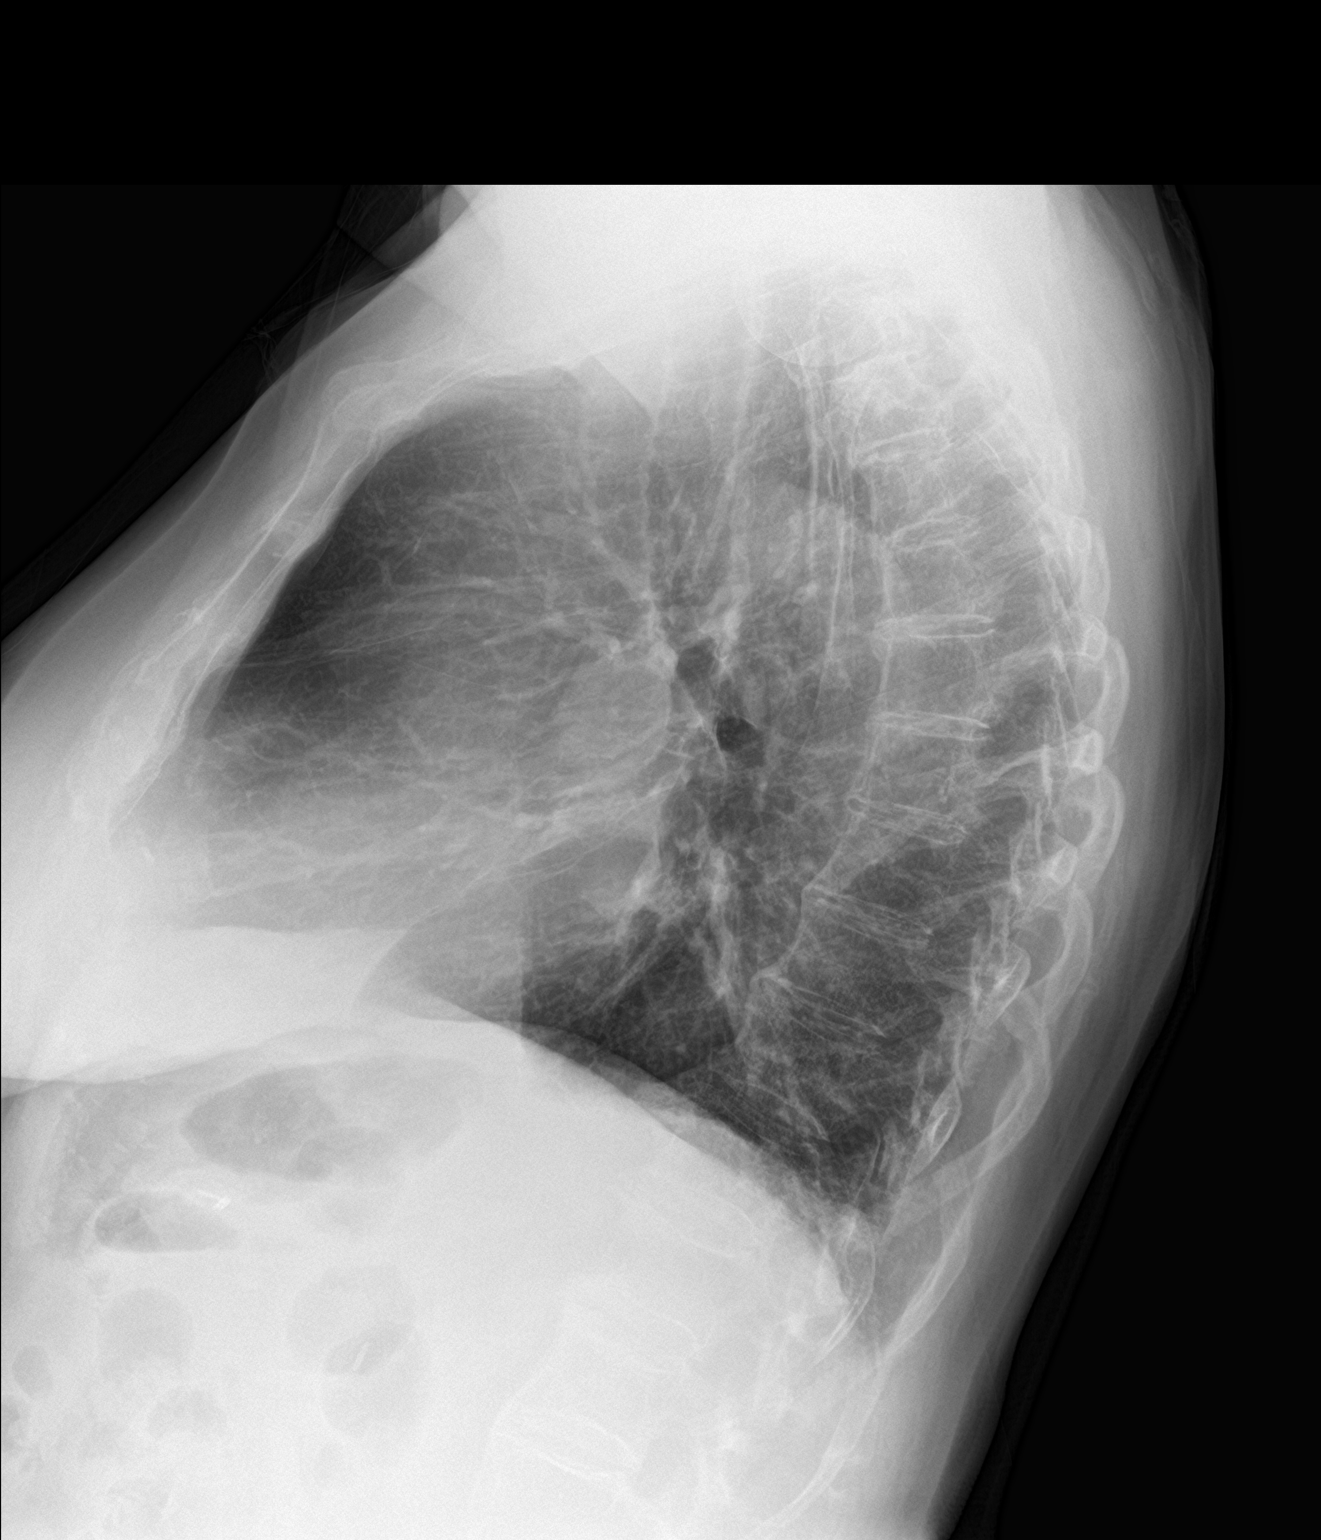

[2 of 2 positions shown; findings below may reference images not displayed]

FINDINGS: Cardiac silhouette is top-normal in size. No mediastinal or hilar
masses. No evidence of adenopathy.

Lungs are clear.  No pleural effusion or pneumothorax.

Skeletal structures are demineralized but grossly intact.
IMPRESSION: No active cardiopulmonary disease.

## 2019-05-31 ENCOUNTER — Encounter: Payer: Self-pay | Admitting: Internal Medicine

## 2019-06-13 DIAGNOSIS — Z20828 Contact with and (suspected) exposure to other viral communicable diseases: Secondary | ICD-10-CM | POA: Diagnosis not present

## 2019-06-13 DIAGNOSIS — I10 Essential (primary) hypertension: Secondary | ICD-10-CM | POA: Diagnosis not present

## 2019-06-13 DIAGNOSIS — Z03818 Encounter for observation for suspected exposure to other biological agents ruled out: Secondary | ICD-10-CM | POA: Diagnosis not present

## 2019-07-02 ENCOUNTER — Encounter: Payer: Self-pay | Admitting: Internal Medicine

## 2019-07-02 DIAGNOSIS — D692 Other nonthrombocytopenic purpura: Secondary | ICD-10-CM | POA: Diagnosis not present

## 2019-07-02 DIAGNOSIS — D1801 Hemangioma of skin and subcutaneous tissue: Secondary | ICD-10-CM | POA: Diagnosis not present

## 2019-07-02 DIAGNOSIS — Z85828 Personal history of other malignant neoplasm of skin: Secondary | ICD-10-CM | POA: Diagnosis not present

## 2019-07-02 DIAGNOSIS — C44319 Basal cell carcinoma of skin of other parts of face: Secondary | ICD-10-CM | POA: Diagnosis not present

## 2019-07-02 DIAGNOSIS — L570 Actinic keratosis: Secondary | ICD-10-CM | POA: Diagnosis not present

## 2019-07-02 DIAGNOSIS — L821 Other seborrheic keratosis: Secondary | ICD-10-CM | POA: Diagnosis not present

## 2019-07-02 DIAGNOSIS — D485 Neoplasm of uncertain behavior of skin: Secondary | ICD-10-CM | POA: Diagnosis not present

## 2019-07-05 ENCOUNTER — Ambulatory Visit (INDEPENDENT_AMBULATORY_CARE_PROVIDER_SITE_OTHER): Payer: Medicare Other | Admitting: Internal Medicine

## 2019-07-05 ENCOUNTER — Encounter: Payer: Self-pay | Admitting: Internal Medicine

## 2019-07-05 ENCOUNTER — Other Ambulatory Visit: Payer: Self-pay

## 2019-07-05 VITALS — BP 128/82 | HR 84 | Temp 98.0°F | Ht 59.5 in | Wt 170.0 lb

## 2019-07-05 DIAGNOSIS — R002 Palpitations: Secondary | ICD-10-CM

## 2019-07-05 DIAGNOSIS — R202 Paresthesia of skin: Secondary | ICD-10-CM | POA: Diagnosis not present

## 2019-07-05 DIAGNOSIS — I10 Essential (primary) hypertension: Secondary | ICD-10-CM

## 2019-07-05 DIAGNOSIS — E559 Vitamin D deficiency, unspecified: Secondary | ICD-10-CM | POA: Diagnosis not present

## 2019-07-05 DIAGNOSIS — N281 Cyst of kidney, acquired: Secondary | ICD-10-CM | POA: Diagnosis not present

## 2019-07-05 DIAGNOSIS — R5383 Other fatigue: Secondary | ICD-10-CM

## 2019-07-05 DIAGNOSIS — R739 Hyperglycemia, unspecified: Secondary | ICD-10-CM

## 2019-07-05 DIAGNOSIS — E785 Hyperlipidemia, unspecified: Secondary | ICD-10-CM | POA: Diagnosis not present

## 2019-07-05 MED ORDER — OLMESARTAN MEDOXOMIL 20 MG PO TABS: 10.0000 mg | ORAL_TABLET | Freq: Every day | ORAL | 11 refills | Status: DC

## 2019-07-05 MED ORDER — METOPROLOL SUCCINATE ER 25 MG PO TB24: 12.5000 mg | ORAL_TABLET | Freq: Every day | ORAL | 3 refills | Status: DC | PRN

## 2019-07-05 NOTE — Assessment & Plan Note (Signed)
Statin intolerant 

## 2019-07-05 NOTE — Assessment & Plan Note (Signed)
C/o fatigue and weak legs on metoprolol XL 25 mg 1/2 a day - use prn palpitations Used to do well on Benicar 20 mg 1/2 tab a day - re-start

## 2019-07-05 NOTE — Progress Notes (Signed)
Subjective:  Patient ID: Diana Stout, female    DOB: Mar 15, 1931  Age: 86 y.o. MRN: [id number]  CC: No chief complaint on file.   HPI Diana Stout presents for palpitations C/o fatigue and weak legs on metoprolol XL 25 mg 1/2 a day Used to do well on Benicar 20 mg 1/2 tab a day  Outpatient Medications Prior to Visit  Medication Sig Dispense Refill  . aspirin 81 MG tablet Take 81 mg by mouth daily.      . cholecalciferol (VITAMIN D) 1000 UNITS tablet Take 1,000 Units by mouth daily.    . cyanocobalamin 1000 MCG tablet Take 1,000 mcg by mouth daily.    . fluticasone (FLONASE) 50 MCG/ACT nasal spray Place 2 sprays into both nostrils as needed. 16 g 11  . furosemide (LASIX) 20 MG tablet TAKE 1 TABLET(20 MG) BY MOUTH DAILY AS NEEDED 90 tablet 3  . Magnesium 400 MG CAPS Take by mouth daily.    . metoprolol succinate (TOPROL-XL) 25 MG 24 hr tablet Take 0.5 tablets (12.5 mg total) by mouth daily. 90 tablet 3  . nitroGLYCERIN (NITROSTAT) 0.4 MG SL tablet Place 1 tablet (0.4 mg total) under the tongue every 5 (five) minutes as needed for chest pain. 20 tablet 1  . potassium chloride (K-DUR) 10 MEQ tablet TAKE 1 TABLET(10 MEQ) BY MOUTH DAILY WITH FUROSEMIDE AS NEEDED 90 tablet 3  . Probiotic Product (ALIGN) 4 MG CAPS Take 1 capsule (4 mg total) by mouth daily. 30 capsule 0  . Red Yeast Rice Extract (RED YEAST RICE PO) Take 2 each by mouth daily.    Marland Kitchen Respiratory Therapy Supplies (FLUTTER) DEVI Use as directed 1 each 0  . sulfamethoxazole-trimethoprim (BACTRIM DS) 800-160 MG tablet Take 1 tablet by mouth 2 (two) times daily. 10 tablet 0   No facility-administered medications prior to visit.    ROS: Review of Systems  Constitutional: Positive for fatigue. Negative for activity change, appetite change, chills and unexpected weight change.  HENT: Negative for congestion, mouth sores and sinus pressure.   Eyes: Negative for visual disturbance.  Respiratory: Negative for cough and chest  tightness.   Gastrointestinal: Negative for abdominal pain and nausea.  Genitourinary: Negative for difficulty urinating, frequency and vaginal pain.  Musculoskeletal: Positive for back pain and gait problem.  Skin: Negative for pallor and rash.  Neurological: Positive for weakness. Negative for dizziness, tremors, numbness and headaches.  Psychiatric/Behavioral: Negative for confusion, sleep disturbance and suicidal ideas.    Objective:  BP 128/82 (BP Location: Left Arm, Patient Position: Sitting, Cuff Size: Normal)   Pulse 84   Temp 98 F (36.7 C) (Oral)   Ht 4' 11.5" (1.511 m)   Wt 170 lb (77.1 kg)   LMP 04/08/1996   SpO2 94%   BMI 33.76 kg/m   BP Readings from Last 3 Encounters:  07/05/19 128/82  04/06/19 136/80  02/25/19 128/68    Wt Readings from Last 3 Encounters:  07/05/19 170 lb (77.1 kg)  04/06/19 168 lb (76.2 kg)  02/25/19 168 lb (76.2 kg)    Physical Exam Constitutional:      General: She is not in acute distress.    Appearance: She is well-developed. She is obese.  HENT:     Head: Normocephalic.     Right Ear: External ear normal.     Left Ear: External ear normal.     Nose: Nose normal.  Eyes:     General:  Right eye: No discharge.        Left eye: No discharge.     Conjunctiva/sclera: Conjunctivae normal.     Pupils: Pupils are equal, round, and reactive to light.  Neck:     Thyroid: No thyromegaly.     Vascular: No JVD.     Trachea: No tracheal deviation.  Cardiovascular:     Rate and Rhythm: Normal rate and regular rhythm.     Heart sounds: Normal heart sounds.  Pulmonary:     Effort: No respiratory distress.     Breath sounds: No stridor. No wheezing.  Abdominal:     General: Bowel sounds are normal. There is no distension.     Palpations: Abdomen is soft. There is no mass.     Tenderness: There is no abdominal tenderness. There is no guarding or rebound.  Musculoskeletal:        General: Tenderness present.     Cervical back:  Normal range of motion and neck supple.  Lymphadenopathy:     Cervical: No cervical adenopathy.  Skin:    Findings: No erythema or rash.  Neurological:     Cranial Nerves: No cranial nerve deficit.     Motor: No abnormal muscle tone.     Coordination: Coordination normal.     Deep Tendon Reflexes: Reflexes normal.  Psychiatric:        Behavior: Behavior normal.        Thought Content: Thought content normal.        Judgment: Judgment normal.     Lab Results  Component Value Date   WBC 5.0 12/29/2018   HGB 12.3 12/29/2018   HCT 38.0 12/29/2018   PLT 196.0 12/29/2018   GLUCOSE 84 12/29/2018   CHOL 185 09/22/2018   TRIG 118.0 09/22/2018   HDL 41.10 09/22/2018   LDLDIRECT 154.9 05/01/2011   LDLCALC 120 (H) 09/22/2018   ALT 11 12/29/2018   AST 16 12/29/2018   NA 142 12/29/2018   K 4.6 12/29/2018   CL 106 12/29/2018   CREATININE 0.63 12/29/2018   BUN 16 12/29/2018   CO2 29 12/29/2018   TSH 0.82 12/29/2018   HGBA1C 5.9 11/20/2016    DG Chest 2 View  Result Date: 10/22/2017 CLINICAL DATA:  86 year old female with shortness of breath for 1-2 weeks, cough and congestion. EXAM: CHEST - 2 VIEW COMPARISON:  High-resolution chest CT 01/24/2017. Chest radiographs 01/01/2017, and earlier. FINDINGS: Stable lung volumes and mediastinal contours. No pneumothorax, pulmonary edema, pleural effusion or confluent pulmonary opacity. Mild chronic increased interstitial markings appear stable. Stable visualized osseous structures. Negative visible bowel gas pattern. Stable cholecystectomy clips. IMPRESSION: No acute cardiopulmonary abnormality. Electronically Signed   By: Genevie Ann M.D.   On: 10/22/2017 11:21    Assessment & Plan:   There are no diagnoses linked to this encounter.   No orders of the defined types were placed in this encounter.    Follow-up: No follow-ups on file.  Walker Kehr, MD

## 2019-07-05 NOTE — Assessment & Plan Note (Signed)
Repeat US

## 2019-07-05 NOTE — Patient Instructions (Signed)
Stop Metoprolol Start Olmesartan 20 mg 1/2 or 1 tablet

## 2019-07-05 NOTE — Assessment & Plan Note (Signed)
D/c metoprolol Labs

## 2019-07-19 ENCOUNTER — Ambulatory Visit
Admission: RE | Admit: 2019-07-19 | Discharge: 2019-07-19 | Disposition: A | Discharge status: Home / Self Care | Payer: Medicare Other | Source: Ambulatory Visit | Attending: Internal Medicine | Admitting: Internal Medicine

## 2019-07-19 DIAGNOSIS — N281 Cyst of kidney, acquired: Secondary | ICD-10-CM

## 2019-07-21 ENCOUNTER — Telehealth: Payer: Self-pay

## 2019-07-21 NOTE — Telephone Encounter (Signed)
New message    Calling for test results  

## 2019-07-22 NOTE — Telephone Encounter (Signed)
[place] results given to pt

## 2019-08-26 ENCOUNTER — Other Ambulatory Visit: Payer: Self-pay

## 2019-08-26 ENCOUNTER — Ambulatory Visit (INDEPENDENT_AMBULATORY_CARE_PROVIDER_SITE_OTHER): Payer: Medicare Other | Admitting: Internal Medicine

## 2019-08-26 ENCOUNTER — Encounter: Payer: Self-pay | Admitting: Internal Medicine

## 2019-08-26 ENCOUNTER — Ambulatory Visit: Payer: Medicare Other | Admitting: Internal Medicine

## 2019-08-26 DIAGNOSIS — E559 Vitamin D deficiency, unspecified: Secondary | ICD-10-CM

## 2019-08-26 DIAGNOSIS — I10 Essential (primary) hypertension: Secondary | ICD-10-CM

## 2019-08-26 DIAGNOSIS — R300 Dysuria: Secondary | ICD-10-CM | POA: Diagnosis not present

## 2019-08-26 DIAGNOSIS — E785 Hyperlipidemia, unspecified: Secondary | ICD-10-CM

## 2019-08-26 DIAGNOSIS — N3000 Acute cystitis without hematuria: Secondary | ICD-10-CM | POA: Diagnosis not present

## 2019-08-26 DIAGNOSIS — R202 Paresthesia of skin: Secondary | ICD-10-CM | POA: Diagnosis not present

## 2019-08-26 DIAGNOSIS — N281 Cyst of kidney, acquired: Secondary | ICD-10-CM

## 2019-08-26 DIAGNOSIS — R739 Hyperglycemia, unspecified: Secondary | ICD-10-CM | POA: Diagnosis not present

## 2019-08-26 DIAGNOSIS — R5383 Other fatigue: Secondary | ICD-10-CM

## 2019-08-26 LAB — HEPATIC FUNCTION PANEL
ALT: 16 U/L (ref 0–35)
AST: 20 U/L (ref 0–37)
Albumin: 3.9 g/dL (ref 3.5–5.2)
Alkaline Phosphatase: 89 U/L (ref 39–117)
Bilirubin, Direct: 0.1 mg/dL (ref 0.0–0.3)
Total Bilirubin: 0.5 mg/dL (ref 0.2–1.2)
Total Protein: 7 g/dL (ref 6.0–8.3)

## 2019-08-26 LAB — BASIC METABOLIC PANEL
BUN: 18 mg/dL (ref 6–23)
CO2: 30 mEq/L (ref 19–32)
Calcium: 9.1 mg/dL (ref 8.4–10.5)
Chloride: 104 mEq/L (ref 96–112)
Creatinine, Ser: 0.67 mg/dL (ref 0.40–1.20)
GFR: 83.36 mL/min (ref >60.00)
Glucose, Bld: 98 mg/dL (ref 70–99)
Potassium: 3.6 mEq/L (ref 3.5–5.1)
Sodium: 139 mEq/L (ref 135–145)

## 2019-08-26 LAB — CBC WITH DIFFERENTIAL/PLATELET
Basophils Absolute: 0 10*3/uL (ref 0.0–0.1)
Basophils Relative: 0.4 % (ref 0.0–3.0)
Eosinophils Absolute: 0.2 10*3/uL (ref 0.0–0.7)
Eosinophils Relative: 1.9 % (ref 0.0–5.0)
HCT: 36.3 % (ref 36.0–46.0)
Hemoglobin: 11.8 g/dL — ABNORMAL LOW (ref 12.0–15.0)
Lymphocytes Relative: 22.3 % (ref 12.0–46.0)
Lymphs Abs: 1.8 10*3/uL (ref 0.7–4.0)
MCHC: 32.5 g/dL (ref 30.0–36.0)
MCV: 86.7 fl (ref 78.0–100.0)
Monocytes Absolute: 0.5 10*3/uL (ref 0.1–1.0)
Monocytes Relative: 6.8 % (ref 3.0–12.0)
Neutro Abs: 5.4 10*3/uL (ref 1.4–7.7)
Neutrophils Relative %: 68.6 % (ref 43.0–77.0)
Platelets: 178 10*3/uL (ref 150.0–400.0)
RBC: 4.18 Mil/uL (ref 3.87–5.11)
RDW: 15.4 % (ref 11.5–15.5)
WBC: 7.8 10*3/uL (ref 4.0–10.5)

## 2019-08-26 LAB — LIPID PANEL
Cholesterol: 205 mg/dL — ABNORMAL HIGH (ref 0–200)
HDL: 44.9 mg/dL (ref >39.00)
LDL Cholesterol: 139 mg/dL — ABNORMAL HIGH (ref 0–99)
NonHDL: 160.37
Total CHOL/HDL Ratio: 5
Triglycerides: 107 mg/dL (ref 0.0–149.0)
VLDL: 21.4 mg/dL (ref 0.0–40.0)

## 2019-08-26 LAB — TSH: TSH: 0.42 u[IU]/mL (ref 0.35–4.50)

## 2019-08-26 LAB — VITAMIN D 25 HYDROXY (VIT D DEFICIENCY, FRACTURES): VITD: 40.04 ng/mL (ref 30.00–100.00)

## 2019-08-26 LAB — VITAMIN B12: Vitamin B-12: 734 pg/mL (ref 211–911)

## 2019-08-26 LAB — HEMOGLOBIN A1C: Hgb A1c MFr Bld: 5.9 % (ref 4.6–6.5)

## 2019-08-26 MED ORDER — SULFAMETHOXAZOLE-TRIMETHOPRIM 800-160 MG PO TABS: 1.0000 | ORAL_TABLET | Freq: Two times a day (BID) | ORAL | 0 refills | Status: DC

## 2019-08-26 NOTE — Assessment & Plan Note (Signed)
UA, Cx Bactrim DS

## 2019-08-26 NOTE — Assessment & Plan Note (Signed)
[place] reviewed w/pt

## 2019-08-26 NOTE — Progress Notes (Signed)
Subjective:  Patient ID: Diana Stout, female    DOB: 05/17/1930  Age: 86 y.o. MRN: [id number]  CC: Urinary Tract Infection (Pain and patient noted blood in urine today)   HPI Diana Stout presents for HTN - low BP at home - off BP meds x 3 wks  C/o blood in urine and burning x 2 d  Outpatient Medications Prior to Visit  Medication Sig Dispense Refill  . aspirin 81 MG tablet Take 81 mg by mouth daily.      . cholecalciferol (VITAMIN D) 1000 UNITS tablet Take 1,000 Units by mouth daily.    . cyanocobalamin 1000 MCG tablet Take 1,000 mcg by mouth daily.    . fluticasone (FLONASE) 50 MCG/ACT nasal spray Place 2 sprays into both nostrils as needed. 16 g 11  . furosemide (LASIX) 20 MG tablet TAKE 1 TABLET(20 MG) BY MOUTH DAILY AS NEEDED 90 tablet 3  . Magnesium 400 MG CAPS Take by mouth daily.    . metoprolol succinate (TOPROL-XL) 25 MG 24 hr tablet Take 0.5 tablets (12.5 mg total) by mouth daily as needed. 90 tablet 3  . nitroGLYCERIN (NITROSTAT) 0.4 MG SL tablet Place 1 tablet (0.4 mg total) under the tongue every 5 (five) minutes as needed for chest pain. 20 tablet 1  . potassium chloride (K-DUR) 10 MEQ tablet TAKE 1 TABLET(10 MEQ) BY MOUTH DAILY WITH FUROSEMIDE AS NEEDED 90 tablet 3  . Probiotic Product (ALIGN) 4 MG CAPS Take 1 capsule (4 mg total) by mouth daily. 30 capsule 0  . Red Yeast Rice Extract (RED YEAST RICE PO) Take 2 each by mouth daily.    Marland Kitchen Respiratory Therapy Supplies (FLUTTER) DEVI Use as directed 1 each 0  . sulfamethoxazole-trimethoprim (BACTRIM DS) 800-160 MG tablet Take 1 tablet by mouth 2 (two) times daily. 10 tablet 0  . olmesartan (BENICAR) 20 MG tablet Take 0.5-1 tablets (10-20 mg total) by mouth daily. (Patient not taking: Reported on 08/26/2019) 30 tablet 11   No facility-administered medications prior to visit.    ROS: Review of Systems  Objective:  BP 112/78 (BP Location: Left Arm, Patient Position: Sitting, Cuff Size: Large)   Pulse 88   Temp  98.4 F (36.9 C) (Oral)   Ht 4' 11.5" (1.511 m)   Wt 169 lb (76.7 kg)   LMP 04/08/1996   SpO2 97%   BMI 33.56 kg/m   BP Readings from Last 3 Encounters:  08/26/19 112/78  07/05/19 128/82  04/06/19 136/80    Wt Readings from Last 3 Encounters:  08/26/19 169 lb (76.7 kg)  07/05/19 170 lb (77.1 kg)  04/06/19 168 lb (76.2 kg)    Physical Exam  Lab Results  Component Value Date   WBC 5.0 12/29/2018   HGB 12.3 12/29/2018   HCT 38.0 12/29/2018   PLT 196.0 12/29/2018   GLUCOSE 84 12/29/2018   CHOL 185 09/22/2018   TRIG 118.0 09/22/2018   HDL 41.10 09/22/2018   LDLDIRECT 154.9 05/01/2011   LDLCALC 120 (H) 09/22/2018   ALT 11 12/29/2018   AST 16 12/29/2018   NA 142 12/29/2018   K 4.6 12/29/2018   CL 106 12/29/2018   CREATININE 0.63 12/29/2018   BUN 16 12/29/2018   CO2 29 12/29/2018   TSH 0.82 12/29/2018   HGBA1C 5.9 11/20/2016    US Renal  Result Date: 07/20/2019 CLINICAL DATA:  Follow-up left renal cyst. EXAM: RENAL / URINARY TRACT ULTRASOUND COMPLETE COMPARISON:  Ultrasound 08/01/2016, 02/27/2010. CT 09/30/2012, 03/08/2011.  FINDINGS: Right Kidney: Renal measurements: 8.3 x 4.7 x 4.8 cm = volume: 97.7 mL . Echogenicity within normal limits. No mass or hydronephrosis visualized. Left Kidney: Renal measurements: 11.5 x 5.2 x 4.8 cm = volume: 150.3 mL. Echogenicity within normal limits. 5.1 x 4.5 x 3.9 cm thinly septated cyst noted over the left lower renal pole. This cyst has increased slightly in size from prior exams but again has a benign appearance. No hydronephrosis visualized. Bladder: Appears normal for degree of bladder distention. Other: None. IMPRESSION: 1. 5.1 x 4.5 x 3.9 cm thinly septated cyst noted the left lower renal pole. This cyst has increased slightly in size from prior exams but again has a benign appearance. 2.  No other focal abnormalities identified.  No hydronephrosis. Electronically Signed   By: Marcello Moores  Register   On: 07/20/2019 06:50    Assessment &  Plan:   There are no diagnoses linked to this encounter.   No orders of the defined types were placed in this encounter.    Follow-up: No follow-ups on file.  Walker Kehr, MD

## 2019-08-26 NOTE — Assessment & Plan Note (Signed)
Labs

## 2019-08-27 LAB — URINALYSIS, ROUTINE W REFLEX MICROSCOPIC
Bilirubin Urine: NEGATIVE
Ketones, ur: NEGATIVE
Leukocytes,Ua: NEGATIVE
Nitrite: NEGATIVE
Specific Gravity, Urine: 1.025 (ref 1.000–1.030)
Total Protein, Urine: 100 — AB
Urine Glucose: NEGATIVE
Urobilinogen, UA: 0.2 (ref 0.0–1.0)
pH: 6 (ref 5.0–8.0)

## 2019-08-28 LAB — CULTURE, URINE COMPREHENSIVE

## 2019-08-30 ENCOUNTER — Telehealth: Payer: Self-pay | Admitting: Internal Medicine

## 2019-08-30 ENCOUNTER — Encounter: Payer: Self-pay | Admitting: Internal Medicine

## 2019-08-30 NOTE — Telephone Encounter (Signed)
New message:    Pt is calling and would like a call from the assistant to go over her test results. Please advise.

## 2019-08-30 NOTE — Telephone Encounter (Signed)
Pt informed of lab results. She c/o low BP today at 101/69 and her HR was 89.  She has not been taking Benicar or Metoprolol due to previous low readings.   She worked outside in her yard the past two days and "felt like something wasn't right" She wants to know if you think it could be the Bactrim she is taking for her UTI or could she be dehydrated. Please advise.

## 2019-08-31 NOTE — Telephone Encounter (Signed)
Pt informed of below.  

## 2019-08-31 NOTE — Telephone Encounter (Signed)
It could be both -please hydrate yourself better.  Finish Bactrim. Thanks

## 2019-10-13 ENCOUNTER — Encounter: Payer: Self-pay | Admitting: Internal Medicine

## 2019-10-13 ENCOUNTER — Other Ambulatory Visit: Payer: Self-pay

## 2019-10-13 ENCOUNTER — Ambulatory Visit (INDEPENDENT_AMBULATORY_CARE_PROVIDER_SITE_OTHER): Payer: Medicare Other | Admitting: Internal Medicine

## 2019-10-13 VITALS — BP 112/68 | HR 87 | Temp 98.0°F | Wt 167.2 lb

## 2019-10-13 DIAGNOSIS — M545 Low back pain, unspecified: Secondary | ICD-10-CM | POA: Insufficient documentation

## 2019-10-13 DIAGNOSIS — Z6834 Body mass index (BMI) 34.0-34.9, adult: Secondary | ICD-10-CM

## 2019-10-13 DIAGNOSIS — R300 Dysuria: Secondary | ICD-10-CM | POA: Diagnosis not present

## 2019-10-13 DIAGNOSIS — I10 Essential (primary) hypertension: Secondary | ICD-10-CM

## 2019-10-13 DIAGNOSIS — G8929 Other chronic pain: Secondary | ICD-10-CM

## 2019-10-13 DIAGNOSIS — E6609 Other obesity due to excess calories: Secondary | ICD-10-CM

## 2019-10-13 MED ORDER — FLUTICASONE PROPIONATE 50 MCG/ACT NA SUSP: 2.0000 | NASAL | 3 refills | Status: DC | PRN

## 2019-10-13 MED ORDER — POTASSIUM CHLORIDE ER 10 MEQ PO TBCR: EXTENDED_RELEASE_TABLET | ORAL | 3 refills | Status: DC

## 2019-10-13 MED ORDER — FUROSEMIDE 20 MG PO TABS: ORAL_TABLET | ORAL | 3 refills | Status: DC

## 2019-10-13 NOTE — Assessment & Plan Note (Signed)
Resolving

## 2019-10-13 NOTE — Assessment & Plan Note (Signed)
Recurrent UA, Cx if worse

## 2019-10-13 NOTE — Assessment & Plan Note (Signed)
Off BP meds 

## 2019-10-13 NOTE — Progress Notes (Signed)
Subjective:  Patient ID: Diana Stout, female    DOB: 30-Apr-1930  Age: 86 y.o. MRN: [id number]  CC: Follow-up (3 MONTH FOLLOW-UP)   HPI Diana Stout presents for sever LBP x 2 weeks Took Tylenol - it helped. It is better F/u HTN - off Rx  F/u UTI - no sx's  Outpatient Medications Prior to Visit  Medication Sig Dispense Refill  . aspirin 81 MG tablet Take 81 mg by mouth daily.      . cholecalciferol (VITAMIN D) 1000 UNITS tablet Take 1,000 Units by mouth daily.    . cyanocobalamin 1000 MCG tablet Take 1,000 mcg by mouth daily.    . fluticasone (FLONASE) 50 MCG/ACT nasal spray Place 2 sprays into both nostrils as needed. 16 g 11  . furosemide (LASIX) 20 MG tablet TAKE 1 TABLET(20 MG) BY MOUTH DAILY AS NEEDED 90 tablet 3  . Magnesium 400 MG CAPS Take by mouth daily.    . nitroGLYCERIN (NITROSTAT) 0.4 MG SL tablet Place 1 tablet (0.4 mg total) under the tongue every 5 (five) minutes as needed for chest pain. 20 tablet 1  . potassium chloride (K-DUR) 10 MEQ tablet TAKE 1 TABLET(10 MEQ) BY MOUTH DAILY WITH FUROSEMIDE AS NEEDED 90 tablet 3  . Probiotic Product (ALIGN) 4 MG CAPS Take 1 capsule (4 mg total) by mouth daily. 30 capsule 0  . Red Yeast Rice Extract (RED YEAST RICE PO) Take 2 each by mouth daily.    Marland Kitchen Respiratory Therapy Supplies (FLUTTER) DEVI Use as directed 1 each 0  . metoprolol succinate (TOPROL-XL) 25 MG 24 hr tablet Take 0.5 tablets (12.5 mg total) by mouth daily as needed. (Patient not taking: Reported on 10/13/2019) 90 tablet 3  . olmesartan (BENICAR) 20 MG tablet Take 0.5-1 tablets (10-20 mg total) by mouth daily. (Patient not taking: Reported on 08/26/2019) 30 tablet 11  . sulfamethoxazole-trimethoprim (BACTRIM DS) 800-160 MG tablet Take 1 tablet by mouth 2 (two) times daily. (Patient not taking: Reported on 10/13/2019) 10 tablet 0   No facility-administered medications prior to visit.    ROS: Review of Systems  Constitutional: Negative for activity change,  appetite change, chills, fatigue and unexpected weight change.  HENT: Negative for congestion, mouth sores and sinus pressure.   Eyes: Negative for visual disturbance.  Respiratory: Negative for cough and chest tightness.   Gastrointestinal: Negative for abdominal pain and nausea.  Genitourinary: Positive for frequency. Negative for difficulty urinating and vaginal pain.  Musculoskeletal: Positive for back pain. Negative for gait problem.  Skin: Negative for pallor and rash.  Neurological: Negative for dizziness, tremors, weakness, numbness and headaches.  Psychiatric/Behavioral: Negative for confusion and sleep disturbance.    Objective:  BP 112/68 (BP Location: Left Arm)   Pulse 87   Temp 98 F (36.7 C) (Oral)   Wt 167 lb 3.2 oz (75.8 kg)   LMP 04/08/1996   SpO2 95%   BMI 33.21 kg/m   BP Readings from Last 3 Encounters:  10/13/19 112/68  08/26/19 112/78  07/05/19 128/82    Wt Readings from Last 3 Encounters:  10/13/19 167 lb 3.2 oz (75.8 kg)  08/26/19 169 lb (76.7 kg)  07/05/19 170 lb (77.1 kg)    Physical Exam Constitutional:      General: She is not in acute distress.    Appearance: She is well-developed.  HENT:     Head: Normocephalic.     Right Ear: External ear normal.     Left Ear: External ear  normal.     Nose: Nose normal.  Eyes:     General:        Right eye: No discharge.        Left eye: No discharge.     Conjunctiva/sclera: Conjunctivae normal.     Pupils: Pupils are equal, round, and reactive to light.  Neck:     Thyroid: No thyromegaly.     Vascular: No JVD.     Trachea: No tracheal deviation.  Cardiovascular:     Rate and Rhythm: Normal rate and regular rhythm.     Heart sounds: Normal heart sounds.  Pulmonary:     Effort: No respiratory distress.     Breath sounds: No stridor. No wheezing.  Abdominal:     General: Bowel sounds are normal. There is no distension.     Palpations: Abdomen is soft. There is no mass.     Tenderness: There is  no abdominal tenderness. There is no guarding or rebound.  Musculoskeletal:        General: No tenderness.     Cervical back: Normal range of motion and neck supple.  Lymphadenopathy:     Cervical: No cervical adenopathy.  Skin:    Findings: No erythema or rash.  Neurological:     Mental Status: She is oriented to person, place, and time.     Cranial Nerves: No cranial nerve deficit.     Motor: No abnormal muscle tone.     Coordination: Coordination normal.     Deep Tendon Reflexes: Reflexes normal.  Psychiatric:        Behavior: Behavior normal.        Thought Content: Thought content normal.        Judgment: Judgment normal.     Lab Results  Component Value Date   WBC 7.8 08/26/2019   HGB 11.8 (L) 08/26/2019   HCT 36.3 08/26/2019   PLT 178.0 08/26/2019   GLUCOSE 98 08/26/2019   CHOL 205 (H) 08/26/2019   TRIG 107.0 08/26/2019   HDL 44.90 08/26/2019   LDLDIRECT 154.9 05/01/2011   LDLCALC 139 (H) 08/26/2019   ALT 16 08/26/2019   AST 20 08/26/2019   NA 139 08/26/2019   K 3.6 08/26/2019   CL 104 08/26/2019   CREATININE 0.67 08/26/2019   BUN 18 08/26/2019   CO2 30 08/26/2019   TSH 0.42 08/26/2019   HGBA1C 5.9 08/26/2019    US Renal  Result Date: 07/20/2019 CLINICAL DATA:  Follow-up left renal cyst. EXAM: RENAL / URINARY TRACT ULTRASOUND COMPLETE COMPARISON:  Ultrasound 08/01/2016, 02/27/2010. CT 09/30/2012, 03/08/2011. FINDINGS: Right Kidney: Renal measurements: 8.3 x 4.7 x 4.8 cm = volume: 97.7 mL . Echogenicity within normal limits. No mass or hydronephrosis visualized. Left Kidney: Renal measurements: 11.5 x 5.2 x 4.8 cm = volume: 150.3 mL. Echogenicity within normal limits. 5.1 x 4.5 x 3.9 cm thinly septated cyst noted over the left lower renal pole. This cyst has increased slightly in size from prior exams but again has a benign appearance. No hydronephrosis visualized. Bladder: Appears normal for degree of bladder distention. Other: None. IMPRESSION: 1. 5.1 x 4.5 x  3.9 cm thinly septated cyst noted the left lower renal pole. This cyst has increased slightly in size from prior exams but again has a benign appearance. 2.  No other focal abnormalities identified.  No hydronephrosis. Electronically Signed   By: Marcello Moores  Register   On: 07/20/2019 06:50    Assessment & Plan:     Walker Kehr, MD

## 2019-10-13 NOTE — Assessment & Plan Note (Signed)
Wt Readings from Last 3 Encounters:  10/13/19 167 lb 3.2 oz (75.8 kg)  08/26/19 169 lb (76.7 kg)  07/05/19 170 lb (77.1 kg)

## 2019-10-20 ENCOUNTER — Other Ambulatory Visit: Payer: Self-pay | Admitting: Internal Medicine

## 2019-11-16 ENCOUNTER — Encounter: Payer: Self-pay | Admitting: Internal Medicine

## 2019-11-16 DIAGNOSIS — H903 Sensorineural hearing loss, bilateral: Secondary | ICD-10-CM | POA: Diagnosis not present

## 2019-12-05 DIAGNOSIS — R300 Dysuria: Secondary | ICD-10-CM | POA: Diagnosis not present

## 2019-12-05 DIAGNOSIS — N3001 Acute cystitis with hematuria: Secondary | ICD-10-CM | POA: Diagnosis not present

## 2019-12-23 NOTE — Progress Notes (Signed)
CARDIOLOGY CONSULT NOTE      Patient ID: Diana Stout MRN: [id number] DOB/AGE: [birth year] 84 y.o.  Primary Physician: Diana Anger, MD Primary Cardiologist: Diana Stout   HPI:  86 y.o. referred by Dr Diana Stout 01/06/19  for chest pain and palpitations. 9/20 had pounding heart with chest tightness for 2.5 hours. BP was elevated some dizziness. Resolved and felt fine. Had been off BP meds for a year Toprol restarted on 12/29/18 by primary ECG reviewed from 9/22 normal sinus normal ST/T waves rate 86 bpm  Previous w/u with normal myovue March 2017 EF 68% Has had normal echo's in 2010 and 2016 Some bronchiectasis  Intolerant to lipitor and Zocor in past  She has not had recurrence of palpitations or chest pain She has gotten out of shape during COVID Some exertional dyspnea   Monitor reviewed from 01/28/19  Average HR 80 PAC;s short bursts <8 beats atrial tachycardia  Daughter who had ENT cancer doing well and has masters in deaf education   ROS All other systems reviewed and negative except as noted above  Past Medical History:  Diagnosis Date  . Abdominal pain, lower 11/22/2010   2015 resolved off Zocor 5/17?urticaria w/GI upset: Dtr is allergic to seafood - the pt is on MegaRed krill oil - d/c MegaRed   . Abnormal chest CT 05/22/2011   Followed in Pulmonary clinic/ [facility]/ [place]  05/14/11 CT CHEST WITHOUT CONTRAST 1. Multiple small pulmonary nodules scattered throughout the lungs  bilaterally ranging in size from 3-6 mm.    2. There is a small hiatal hernia.  3. Atherosclerosis.  4. Status post cholecystectomy.  5. Low attenuation hepatic lesions unchanged compared to recent  abdominal CT scan, likely to represent cysts  . Acute pharyngitis 02/28/2007   Qualifier: Diagnosis of  By: Diana Bill MD, Diana Stout   . ANEMIA, NORMOCYTIC 08/10/2009   mild    . BRONCHITIS, ACUTE 06/12/2007   Qualifier: Diagnosis of  By: Plotnikov MD, Diana Stout Canker sore 01/01/2017   9/18   x1  .  CAP (community acquired pneumonia) 01/01/2017   9/18 probable L lung  . Chest pain, atypical 06/22/2014   06/22/14 ?etiology   . Cough 04/20/2007   Qualifier: Diagnosis of  By: Plotnikov MD, Diana Stout   . Diarrhea 07/19/2013   4/15 x 1 mo ?etiol 8/18  . DIVERTICULOSIS, COLON 03/08/2007   Qualifier: Diagnosis of  By: Diana Reichmann MD, Diana Stout   . Dizziness 07/19/2013   Likely due to diarrhea/meds 4/15   . DVT (deep venous thrombosis) ([place]) 1998   hx of right  . DVT, HX OF 02/02/2007   Qualifier: Diagnosis of  By: [name]    . Dyslipidemia 02/02/2007   Chronic 1/13 worse    . Dyspnea 07/01/2008   2/13 improved w/reg exercise 1/17 deconditioning - CL stress test was nl    . Dysuria 05/18/2013   2/15, 1/17   . Edema 09/27/2009   Qualifier: Diagnosis of  By: Diana Cancel, MD, [name]   . ESOPHAGEAL STRICTURE 03/06/2007   Qualifier: Diagnosis of  By: Diana Reichmann MD, Diana Stout   . Esophagitis 2006  . Essential hypertension 03/08/2007   Chronic  On Benicar   . Fatigue 10/15/2011   2015 resolved off Zocor   . FOOT PAIN 07/25/2009   R foot OA    . GERD 02/02/2007   Wedge pillow Protonix prn, Pepcid - d/c  . GLUCOSE INTOLERANCE 03/06/2007   Qualifier: Diagnosis  of  By: Diana Reichmann MD, Diana Stout   . History of shingles   . Hyperlipidemia   . Hypoxemia 08/01/2009   Qualifier: Diagnosis of  By: Plotnikov MD, Diana Stout   . IBS 03/06/2007   Qualifier: Diagnosis of  By: Diana Reichmann MD, Diana Stout   . Impaired glucose tolerance 11/22/2010  . Kidney cyst, acquired 07/22/2016   Remote   . Kidney cysts    left- Dr Diana Stout  . Lung nodule 05/08/2011   04/15/11 RLL 4 mm nodule on abd CT - Urol office   . Multiple lung nodules 2012   seen on CT scan by Dr Diana Stout, pulmonologist  . Nausea alone 07/01/2008   2015 resolved off Zocor    . Obesity 01/03/2015  . Osteoporosis 02/02/2007   Chronic  Vit D, yoga  . Palpitations 05/12/2014   Dr Diana Stout 2/16 poss due to steroid pack   . Pneumonia 12/2016  . Rash and nonspecific skin  eruption 09/12/2015   5/17?urticaria Dtr is allergic to seafood - the pt is on MegaRed krill oil - d/c MegaRed   . Recurrent pneumonia 08/15/2009   Qualifier: Diagnosis of  By: Plotnikov MD, Diana Stout   . Right-sided thoracic back pain 05/12/2014   2/16 MSK   . SHINGLES, HX OF 03/06/2007   Qualifier: Diagnosis of  By: Diana Reichmann MD, Diana Stout   . SINUSITIS- ACUTE-NOS 03/06/2007   Qualifier: Diagnosis of  By: Diana Reichmann MD, Diana Stout   . SKIN RASH 08/10/2009   Qualifier: Diagnosis of  By: Plotnikov MD, Diana Stout   . Snoring 12/22/2013   Worse 9/15   . Upper airway cough syndrome 12/08/2014   Dr Diana Stout Flutter valve added 01/02/2015 > resolved 02/03/2015  - rec try off gerd rx p 03/09/15   . URI, acute 01/01/2011   9/12, 7/13, 1/14, 2/15, 9/16   . UTI 07/19/2008   Qualifier: Diagnosis of  By: Plotnikov MD, Diana Stout   . Wheezing 12/08/2014    Family History  Problem Relation Age of Onset  . Asthma Mother   . Diabetes Father   . Cancer Father        liver  . Cancer Sister        breast  . Diabetes Sister   . Asthma Sister   . Breast cancer Sister   . Diabetes Brother   . Asthma Brother   . Asthma Sister   . Diabetes Brother   . Coronary artery disease Other        1 st degree female relative  . Thyroid disease Maternal Uncle     Social History   Socioeconomic History  . Marital status: Widowed    Spouse name: Not on file  . Number of children: 3  . Years of education: Not on file  . Highest education level: Not on file  Occupational History  . Occupation: Retired    Fish farm manager: RETIRED  Tobacco Use  . Smoking status: Never Smoker  . Smokeless tobacco: Never Used  Vaping Use  . Vaping Use: Never used  Substance and Sexual Activity  . Alcohol use: No  . Drug use: No  . Sexual activity: Not Currently  Other Topics Concern  . Not on file  Social History Narrative  . Not on file   Social Determinants of Health   Financial Resource Strain:   . Difficulty of Paying Living Expenses: Not on file    Food Insecurity:   . Worried About Charity fundraiser in  the Last Year: Not on file  . Ran Out of Food in the Last Year: Not on file  Transportation Needs:   . Lack of Transportation (Medical): Not on file  . Lack of Transportation (Non-Medical): Not on file  Physical Activity:   . Days of Exercise per Week: Not on file  . Minutes of Exercise per Session: Not on file  Stress:   . Feeling of Stress : Not on file  Social Connections:   . Frequency of Communication with Friends and Family: Not on file  . Frequency of Social Gatherings with Friends and Family: Not on file  . Attends Religious Services: Not on file  . Active Member of Clubs or Organizations: Not on file  . Attends Archivist Meetings: Not on file  . Marital Status: Not on file  Intimate Partner Violence:   . Fear of Current or Ex-Partner: Not on file  . Emotionally Abused: Not on file  . Physically Abused: Not on file  . Sexually Abused: Not on file    Past Surgical History:  Procedure Laterality Date  . APPENDECTOMY    . CHOLECYSTECTOMY    . HYSTEROSCOPY WITH RESECTOSCOPE  7/99   resect polyp  . MOHS SURGERY  05/2018   face  . TUBAL LIGATION Bilateral         Physical Exam: Blood pressure 118/70, pulse 80, height 4' 11.5" (1.511 m), weight 161 lb (73 kg), last menstrual period 04/08/1996, SpO2 96 %.   Affect appropriate Healthy:  appears stated age 75: normal Neck supple with no adenopathy JVP normal no bruits no thyromegaly Lungs clear with no wheezing and good diaphragmatic motion Heart:  S1/S2 no murmur, no rub, gallop or click PMI normal Abdomen: benighn, BS positve, no tenderness, no AAA no bruit.  No HSM or HJR Distal pulses intact with no bruits No edema Neuro non-focal Skin warm and dry No muscular weakness   Labs:   Lab Results  Component Value Date   WBC 7.8 08/26/2019   HGB 11.8 (L) 08/26/2019   HCT 36.3 08/26/2019   MCV 86.7 08/26/2019   PLT 178.0 08/26/2019     No results for input(s): NA, K, CL, CO2, BUN, CREATININE, CALCIUM, PROT, BILITOT, ALKPHOS, ALT, AST, GLUCOSE in the last 168 hours.  Invalid input(s): LABALBU Lab Results  Component Value Date   XIHWTUU 828 (H) 08/02/2009   CKMB 1.5 06/22/2014   TROPONINI 0.01        NO INDICATION OF MYOCARDIAL INJURY. 08/02/2009    Lab Results  Component Value Date   CHOL 205 (H) 08/26/2019   CHOL 185 09/22/2018   CHOL 218 (H) 11/04/2017   Lab Results  Component Value Date   HDL 44.90 08/26/2019   HDL 41.10 09/22/2018   HDL 51.80 11/04/2017   Lab Results  Component Value Date   LDLCALC 139 (H) 08/26/2019   LDLCALC 120 (H) 09/22/2018   LDLCALC 148 (H) 11/04/2017   Lab Results  Component Value Date   TRIG 107.0 08/26/2019   TRIG 118.0 09/22/2018   TRIG 88.0 11/04/2017   Lab Results  Component Value Date   CHOLHDL 5 08/26/2019   CHOLHDL 4 09/22/2018   CHOLHDL 4 11/04/2017   Lab Results  Component Value Date   LDLDIRECT 154.9 05/01/2011      Radiology: No results found.  EKG: 12/29/18 NSR normal ECG  01/04/20 SR rate 80 LAD low voltage    ASSESSMENT AND PLAN:   1.  Palpitations:  Likely self limited episode of PSVT Continue Toprol Monitor reviewed and no SVT/PAF or serious arrhythmia 2. Chest pain:  In setting of palpitations Normal ECG and previously normal myovue x 3 most recent March 2017 no indication for stress testing currently  3. HTN:  Per primary off BP meds 10/13/19 BP office visit 112/68 mmHg 10/13/19 4. Pulmonary:  Bronchiectasis no recent pneumonia Flu shot f/u pulmonary   F/U in a year new nitro called in   Time:  Spent reviewing chart direct patient tele visit and composing note 20 minutes    Signed: Jenkins Rouge 01/05/2020, 9:32 AM

## 2019-12-27 ENCOUNTER — Telehealth: Payer: Self-pay

## 2019-12-27 ENCOUNTER — Other Ambulatory Visit: Payer: Medicare Other

## 2019-12-27 DIAGNOSIS — R300 Dysuria: Secondary | ICD-10-CM | POA: Diagnosis not present

## 2019-12-27 NOTE — Telephone Encounter (Signed)
Pt has came to lab and gave a urine sample. Pt states she has UTI in July and August and is sure she has another one and would like a Rx for UTI. Please advise

## 2019-12-28 LAB — URINALYSIS
Bilirubin Urine: NEGATIVE
Glucose, UA: NEGATIVE
Ketones, ur: NEGATIVE
Nitrite: NEGATIVE
Specific Gravity, Urine: 1.019 (ref 1.001–1.03)
pH: 6 (ref 5.0–8.0)

## 2019-12-28 LAB — CULTURE, URINE COMPREHENSIVE

## 2019-12-29 ENCOUNTER — Other Ambulatory Visit: Payer: Self-pay | Admitting: Internal Medicine

## 2019-12-29 MED ORDER — SULFAMETHOXAZOLE-TRIMETHOPRIM 800-160 MG PO TABS: 1.0000 | ORAL_TABLET | Freq: Two times a day (BID) | ORAL | 1 refills | Status: DC

## 2019-12-30 MED ORDER — CIPROFLOXACIN HCL 250 MG PO TABS: 250.0000 mg | ORAL_TABLET | Freq: Two times a day (BID) | ORAL | 0 refills | Status: DC

## 2019-12-30 NOTE — Telephone Encounter (Signed)
Ok cipro Thx

## 2019-12-30 NOTE — Telephone Encounter (Signed)
Cipro sent in today.

## 2019-12-30 NOTE — Telephone Encounter (Signed)
    Patient request order for Cipro or Macrobid. Patient states Bactrim has not helped her with UTI

## 2020-01-05 ENCOUNTER — Other Ambulatory Visit: Payer: Self-pay

## 2020-01-05 ENCOUNTER — Ambulatory Visit (INDEPENDENT_AMBULATORY_CARE_PROVIDER_SITE_OTHER): Payer: Medicare Other | Admitting: Cardiovascular Disease

## 2020-01-05 VITALS — BP 118/70 | HR 80 | Ht 59.5 in | Wt 161.0 lb

## 2020-01-05 DIAGNOSIS — I10 Essential (primary) hypertension: Secondary | ICD-10-CM | POA: Diagnosis not present

## 2020-01-05 DIAGNOSIS — R002 Palpitations: Secondary | ICD-10-CM

## 2020-01-05 MED ORDER — NITROGLYCERIN 0.4 MG SL SUBL: 0.4000 mg | SUBLINGUAL_TABLET | SUBLINGUAL | 3 refills | Status: DC | PRN

## 2020-01-05 NOTE — Patient Instructions (Signed)

## 2020-01-10 DIAGNOSIS — Z23 Encounter for immunization: Secondary | ICD-10-CM | POA: Diagnosis not present

## 2020-01-26 DIAGNOSIS — Z803 Family history of malignant neoplasm of breast: Secondary | ICD-10-CM | POA: Diagnosis not present

## 2020-01-26 DIAGNOSIS — Z1231 Encounter for screening mammogram for malignant neoplasm of breast: Secondary | ICD-10-CM | POA: Diagnosis not present

## 2020-01-31 ENCOUNTER — Encounter: Payer: Self-pay | Admitting: Internal Medicine

## 2020-01-31 ENCOUNTER — Other Ambulatory Visit: Payer: Self-pay

## 2020-01-31 ENCOUNTER — Ambulatory Visit (INDEPENDENT_AMBULATORY_CARE_PROVIDER_SITE_OTHER): Payer: Medicare Other | Admitting: Internal Medicine

## 2020-01-31 VITALS — BP 126/78 | HR 86 | Temp 97.9°F | Wt 161.0 lb

## 2020-01-31 DIAGNOSIS — N390 Urinary tract infection, site not specified: Secondary | ICD-10-CM | POA: Diagnosis not present

## 2020-01-31 DIAGNOSIS — N3001 Acute cystitis with hematuria: Secondary | ICD-10-CM | POA: Diagnosis not present

## 2020-01-31 DIAGNOSIS — R300 Dysuria: Secondary | ICD-10-CM | POA: Diagnosis not present

## 2020-01-31 LAB — POCT URINALYSIS DIPSTICK
Bilirubin, UA: NEGATIVE
Glucose, UA: NEGATIVE
Ketones, UA: NEGATIVE
Nitrite, UA: NEGATIVE
Protein, UA: NEGATIVE
Spec Grav, UA: 1.02 (ref 1.010–1.025)
Urobilinogen, UA: 0.2 E.U./dL
pH, UA: 6 (ref 5.0–8.0)

## 2020-01-31 MED ORDER — AMOXICILLIN-POT CLAVULANATE 875-125 MG PO TABS: 1.0000 | ORAL_TABLET | Freq: Two times a day (BID) | ORAL | 0 refills | Status: DC

## 2020-01-31 NOTE — Assessment & Plan Note (Signed)
Acute Urine dip consistent with UTI Will send urine for culture Take the antibiotic as prescribed.  Augmentin 875-125 mg twice daily x1 week Take tylenol if needed.   Increase your water intake.  Call if no improvement

## 2020-01-31 NOTE — Assessment & Plan Note (Signed)
Acute Having recurrent urinary tract infections-this is the fourth-fifth UTI she has had since May Sees GYN annually-last visit summer 2020-exam normal at that time She is not due for follow-up with her GYN anytime soon Will refer to urology for further evaluation of her recurrent UTIs

## 2020-01-31 NOTE — Progress Notes (Signed)
Subjective:    Patient ID: Diana Stout, female    DOB: 1930-12-01, 86 y.o.   MRN: [id number]  HPI The patient is here for an acute visit.   Painful urination -  It started Friday night.  She has frequency and burning with urination.  At 1 point she may have seen a slight pinkish discoloration on her pad.  She also states some urgency and chills.  She denies any fever, abdominal pain, nausea or back pain.   Since may she has had 4 UTI's.  Prior to this year she has not had any issues with this.  She has taken Bactrim a few times and CIpro once.  She has also been experiencing increased leakage-she wears a pad daily.  She is concerned about why she is having a problem with urinary tract infections this year when she has not in the past.  She does see her gynecologist yearly and last saw her in 2020.  Her exam was normal at that time.   Medications and allergies reviewed with patient and updated if appropriate.  Patient Active Problem List   Diagnosis Date Noted  . Low back pain 10/13/2019  . Chest pressure 12/29/2018  . CAD (coronary artery disease) 03/04/2018  . Abnormal urinalysis 11/24/2017  . Lightheadedness 11/05/2017  . Canker sore 01/01/2017  . Kidney cyst, acquired 07/22/2016  . Rash and nonspecific skin eruption 09/12/2015  . Obesity 01/03/2015  . Upper airway cough syndrome 12/08/2014  . Chest pain, atypical 06/22/2014  . Right-sided thoracic back pain 05/12/2014  . Palpitations 05/12/2014  . Snoring 12/22/2013  . Diarrhea 07/19/2013  . Dizziness 07/19/2013  . Dysuria 05/18/2013  . Fatigue 10/15/2011  . Impaired glucose tolerance 11/22/2010  . Preventative health care 11/22/2010  . Abdominal pain, lower 11/22/2010  . SYNCOPE 09/27/2009  . Edema 09/27/2009  . Recurrent pneumonia 08/15/2009  . ANEMIA, NORMOCYTIC 08/10/2009  . SKIN RASH 08/10/2009  . FOOT PAIN 07/25/2009  . Dyspnea 07/01/2008  . Bronchitis 06/12/2007  . Essential hypertension 03/08/2007    . DIVERTICULOSIS, COLON 03/08/2007  . ESOPHAGEAL STRICTURE 03/06/2007  . IBS 03/06/2007  . SHINGLES, HX OF 03/06/2007  . Dyslipidemia 02/02/2007  . GERD 02/02/2007  . Osteoporosis 02/02/2007  . DVT, HX OF 02/02/2007    Current Outpatient Medications on File Prior to Visit  Medication Sig Dispense Refill  . aspirin 81 MG tablet Take 81 mg by mouth daily.      . cholecalciferol (VITAMIN D) 1000 UNITS tablet Take 1,000 Units by mouth daily.    . cyanocobalamin 1000 MCG tablet Take 1,000 mcg by mouth daily.    . fluticasone (FLONASE) 50 MCG/ACT nasal spray Place 2 sprays into both nostrils as needed for rhinitis. 48 g 3  . furosemide (LASIX) 20 MG tablet TAKE 1 TABLET(20 MG) BY MOUTH DAILY AS NEEDED 90 tablet 3  . KLOR-CON M10 10 MEQ tablet Take 10 mEq by mouth daily. (Patient not taking: Reported on 01/31/2020)    . Magnesium 400 MG CAPS Take by mouth daily.    . nitroGLYCERIN (NITROSTAT) 0.4 MG SL tablet Place 1 tablet (0.4 mg total) under the tongue every 5 (five) minutes as needed for chest pain. 25 tablet 3  . potassium chloride (KLOR-CON) 10 MEQ tablet TAKE 1 TABLET(10 MEQ) BY MOUTH DAILY WITH FUROSEMIDE AS NEEDED 90 tablet 3  . Probiotic Product (ALIGN) 4 MG CAPS Take 1 capsule (4 mg total) by mouth daily. 30 capsule 0  . Red Yeast  Rice Extract (RED YEAST RICE PO) Take 2 each by mouth daily.    Marland Kitchen Respiratory Therapy Supplies (FLUTTER) DEVI Use as directed 1 each 0   No current facility-administered medications on file prior to visit.    Past Medical History:  Diagnosis Date  . Abdominal pain, lower 11/22/2010   2015 resolved off Zocor 5/17?urticaria w/GI upset: Dtr is allergic to seafood - the pt is on MegaRed krill oil - d/c MegaRed   . Abnormal chest CT 05/22/2011   Followed in Pulmonary clinic/ [facility]/ [place]  05/14/11 CT CHEST WITHOUT CONTRAST 1. Multiple small pulmonary nodules scattered throughout the lungs  bilaterally ranging in size from 3-6 mm.    2. There is a  small hiatal hernia.  3. Atherosclerosis.  4. Status post cholecystectomy.  5. Low attenuation hepatic lesions unchanged compared to recent  abdominal CT scan, likely to represent cysts  . Acute pharyngitis 02/28/2007   Qualifier: Diagnosis of  By: [name] MD, [name]   . ANEMIA, NORMOCYTIC 08/10/2009   mild    . BRONCHITIS, ACUTE 06/12/2007   Qualifier: Diagnosis of  By: Plotnikov MD, [name] Canker sore 01/01/2017   9/18   x1  . CAP (community acquired pneumonia) 01/01/2017   9/18 probable L lung  . Chest pain, atypical 06/22/2014   06/22/14 ?etiology   . Cough 04/20/2007   Qualifier: Diagnosis of  By: Plotnikov MD, [name]   . Diarrhea 07/19/2013   4/15 x 1 mo ?etiol 8/18  . DIVERTICULOSIS, COLON 03/08/2007   Qualifier: Diagnosis of  By: [name] MD, [name]   . Dizziness 07/19/2013   Likely due to diarrhea/meds 4/15   . DVT (deep venous thrombosis) ([place]) 1998   hx of right  . DVT, HX OF 02/02/2007   Qualifier: Diagnosis of  By: [name]    . Dyslipidemia 02/02/2007   Chronic 1/13 worse    . Dyspnea 07/01/2008   2/13 improved w/reg exercise 1/17 deconditioning - CL stress test was nl    . Dysuria 05/18/2013   2/15, 1/17   . Edema 09/27/2009   Qualifier: Diagnosis of  By: [name], MD, [name]   . ESOPHAGEAL STRICTURE 03/06/2007   Qualifier: Diagnosis of  By: [name] MD, [name]   . Esophagitis 2006  . Essential hypertension 03/08/2007   Chronic  On Benicar   . Fatigue 10/15/2011   2015 resolved off Zocor   . FOOT PAIN 07/25/2009   R foot OA    . GERD 02/02/2007   Wedge pillow Protonix prn, Pepcid - d/c  . GLUCOSE INTOLERANCE 03/06/2007   Qualifier: Diagnosis of  By: [name] MD, [name]   . History of shingles   . Hyperlipidemia   . Hypoxemia 08/01/2009   Qualifier: Diagnosis of  By: Plotnikov MD, [name]   . IBS 03/06/2007   Qualifier: Diagnosis of  By: [name] MD, [name]   . Impaired glucose tolerance 11/22/2010  . Kidney cyst, acquired 07/22/2016   Remote   .  Kidney cysts    left- Dr [name]  . Lung nodule 05/08/2011   04/15/11 RLL 4 mm nodule on abd CT - Urol office   . Multiple lung nodules 2012   seen on CT scan by Dr [name], pulmonologist  . Nausea alone 07/01/2008   2015 resolved off Zocor    . Obesity 01/03/2015  . Osteoporosis 02/02/2007   Chronic  Vit D, yoga  . Palpitations 05/12/2014  Dr [name] 2/16 poss due to steroid pack   . Pneumonia 12/2016  . Rash and nonspecific skin eruption 09/12/2015   5/17?urticaria Dtr is allergic to seafood - the pt is on MegaRed krill oil - d/c MegaRed   . Recurrent pneumonia 08/15/2009   Qualifier: Diagnosis of  By: Plotnikov MD, [name]   . Right-sided thoracic back pain 05/12/2014   2/16 MSK   . SHINGLES, HX OF 03/06/2007   Qualifier: Diagnosis of  By: [name] MD, [name]   . SINUSITIS- ACUTE-NOS 03/06/2007   Qualifier: Diagnosis of  By: [name] MD, [name]   . SKIN RASH 08/10/2009   Qualifier: Diagnosis of  By: Plotnikov MD, [name]   . Snoring 12/22/2013   Worse 9/15   . Upper airway cough syndrome 12/08/2014   Dr [name] Flutter valve added 01/02/2015 > resolved 02/03/2015  - rec try off gerd rx p 03/09/15   . URI, acute 01/01/2011   9/12, 7/13, 1/14, 2/15, 9/16   . UTI 07/19/2008   Qualifier: Diagnosis of  By: Plotnikov MD, [name]   . Wheezing 12/08/2014    Past Surgical History:  Procedure Laterality Date  . APPENDECTOMY    . CHOLECYSTECTOMY    . HYSTEROSCOPY WITH RESECTOSCOPE  7/99   resect polyp  . MOHS SURGERY  05/2018   face  . TUBAL LIGATION Bilateral     Social History   Socioeconomic History  . Marital status: Widowed    Spouse name: Not on file  . Number of children: 3  . Years of education: Not on file  . Highest education level: Not on file  Occupational History  . Occupation: Retired    Fish farm manager: RETIRED  Tobacco Use  . Smoking status: Never Smoker  . Smokeless tobacco: Never Used  Vaping Use  . Vaping Use: Never used  Substance and Sexual Activity  . Alcohol use: No  .  Drug use: No  . Sexual activity: Not Currently  Other Topics Concern  . Not on file  Social History Narrative  . Not on file   Social Determinants of Health   Financial Resource Strain:   . Difficulty of Paying Living Expenses: Not on file  Food Insecurity:   . Worried About Charity fundraiser in the Last Year: Not on file  . Ran Out of Food in the Last Year: Not on file  Transportation Needs:   . Lack of Transportation (Medical): Not on file  . Lack of Transportation (Non-Medical): Not on file  Physical Activity:   . Days of Exercise per Week: Not on file  . Minutes of Exercise per Session: Not on file  Stress:   . Feeling of Stress : Not on file  Social Connections:   . Frequency of Communication with Friends and Family: Not on file  . Frequency of Social Gatherings with Friends and Family: Not on file  . Attends Religious Services: Not on file  . Active Member of Clubs or Organizations: Not on file  . Attends Archivist Meetings: Not on file  . Marital Status: Not on file    Family History  Problem Relation Age of Onset  . Asthma Mother   . Diabetes Father   . Cancer Father        liver  . Cancer Sister        breast  . Diabetes Sister   . Asthma Sister   . Breast cancer Sister   . Diabetes Brother   .  Asthma Brother   . Asthma Sister   . Diabetes Brother   . Coronary artery disease Other        1 st degree female relative  . Thyroid disease Maternal Uncle     Review of Systems  Constitutional: Positive for chills. Negative for fever.  Gastrointestinal: Negative for abdominal pain and nausea.  Genitourinary: Positive for dysuria, frequency, hematuria (pink one time) and urgency.  Musculoskeletal: Negative for back pain.       Objective:   Vitals:   01/31/20 1408  BP: 126/78  Pulse: 86  Temp: 97.9 F (36.6 C)  SpO2: 96%   BP Readings from Last 3 Encounters:  01/31/20 126/78  01/05/20 118/70  10/13/19 112/68   Wt Readings from Last 3  Encounters:  01/31/20 161 lb (73 kg)  01/05/20 161 lb (73 kg)  10/13/19 167 lb 3.2 oz (75.8 kg)   Body mass index is 31.97 kg/m.   Physical Exam Constitutional:      General: She is not in acute distress.    Appearance: Normal appearance. She is not ill-appearing.  HENT:     Head: Normocephalic and atraumatic.  Abdominal:     General: There is no distension.     Tenderness: There is no abdominal tenderness. There is no right CVA tenderness, left CVA tenderness, guarding or rebound.  Skin:    General: Skin is warm and dry.  Neurological:     Mental Status: She is alert.            Assessment & Plan:    See Problem List for Assessment and Plan of chronic medical problems.    This visit occurred during the SARS-CoV-2 public health emergency.  Safety protocols were in place, including screening questions prior to the visit, additional usage of staff PPE, and extensive cleaning of exam room while observing appropriate contact time as indicated for disinfecting solutions.

## 2020-01-31 NOTE — Patient Instructions (Addendum)
Take the antibiotic as prescribed.  Take tylenol if needed.     Increase your water intake.   Call if no improvement   A referral was ordered for Urology.       Someone from their office will call you to schedule an appointment.     Urinary Tract Infection, Adult A urinary tract infection (UTI) is an infection of any part of the urinary tract, which includes the kidneys, ureters, bladder, and urethra. These organs make, store, and get rid of urine in the body. UTI can be a bladder infection (cystitis) or kidney infection (pyelonephritis). What are the causes? This infection may be caused by fungi, viruses, or bacteria. Bacteria are the most common cause of UTIs. This condition can also be caused by repeated incomplete emptying of the bladder during urination. What increases the risk? This condition is more likely to develop if:  You ignore your need to urinate or hold urine for long periods of time.  You do not empty your bladder completely during urination.  You wipe back to front after urinating or having a bowel movement, if you are female.  You are uncircumcised, if you are female.  You are constipated.  You have a urinary catheter that stays in place (indwelling).  You have a weak defense (immune) system.  You have a medical condition that affects your bowels, kidneys, or bladder.  You have diabetes.  You take antibiotic medicines frequently or for long periods of time, and the antibiotics no longer work well against certain types of infections (antibiotic resistance).  You take medicines that irritate your urinary tract.  You are exposed to chemicals that irritate your urinary tract.  You are female.  What are the signs or symptoms? Symptoms of this condition include:  Fever.  Frequent urination or passing small amounts of urine frequently.  Needing to urinate urgently.  Pain or burning with urination.  Urine that smells bad or unusual.  Cloudy  urine.  Pain in the lower abdomen or back.  Trouble urinating.  Blood in the urine.  Vomiting or being less hungry than normal.  Diarrhea or abdominal pain.  Vaginal discharge, if you are female.  How is this diagnosed? This condition is diagnosed with a medical history and physical exam. You will also need to provide a urine sample to test your urine. Other tests may be done, including:  Blood tests.  Sexually transmitted disease (STD) testing.  If you have had more than one UTI, a cystoscopy or imaging studies may be done to determine the cause of the infections. How is this treated? Treatment for this condition often includes a combination of two or more of the following:  Antibiotic medicine.  Other medicines to treat less common causes of UTI.  Over-the-counter medicines to treat pain.  Drinking enough water to stay hydrated.  Follow these instructions at home:  Take over-the-counter and prescription medicines only as told by your health care provider.  If you were prescribed an antibiotic, take it as told by your health care provider. Do not stop taking the antibiotic even if you start to feel better.  Avoid alcohol, caffeine, tea, and carbonated beverages. They can irritate your bladder.  Drink enough fluid to keep your urine clear or pale yellow.  Keep all follow-up visits as told by your health care provider. This is important.  Make sure to: ? Empty your bladder often and completely. Do not hold urine for long periods of time. ? Empty your bladder  before and after sex. ? Wipe from front to back after a bowel movement if you are female. Use each tissue one time when you wipe. Contact a health care provider if:  You have back pain.  You have a fever.  You feel nauseous or vomit.  Your symptoms do not get better after 3 days.  Your symptoms go away and then return. Get help right away if:  You have severe back pain or lower abdominal pain.  You  are vomiting and cannot keep down any medicines or water. This information is not intended to replace advice given to you by your health care provider. Make sure you discuss any questions you have with your health care provider. Document Released: 01/02/2005 Document Revised: 09/06/2015 Document Reviewed: 02/13/2015 Elsevier Interactive Patient Education  [employer].

## 2020-02-01 ENCOUNTER — Encounter: Payer: Self-pay | Admitting: Obstetrics & Gynecology

## 2020-02-02 LAB — URINE CULTURE

## 2020-02-16 DIAGNOSIS — N3946 Mixed incontinence: Secondary | ICD-10-CM | POA: Diagnosis not present

## 2020-02-16 DIAGNOSIS — N390 Urinary tract infection, site not specified: Secondary | ICD-10-CM | POA: Diagnosis not present

## 2020-02-22 DIAGNOSIS — Z23 Encounter for immunization: Secondary | ICD-10-CM | POA: Diagnosis not present

## 2020-02-28 ENCOUNTER — Telehealth: Payer: Self-pay | Admitting: Pulmonary Disease

## 2020-02-28 ENCOUNTER — Other Ambulatory Visit: Payer: Self-pay

## 2020-02-28 ENCOUNTER — Encounter: Payer: Self-pay | Admitting: Adult Health

## 2020-02-28 ENCOUNTER — Ambulatory Visit (INDEPENDENT_AMBULATORY_CARE_PROVIDER_SITE_OTHER): Payer: Medicare Other | Admitting: Adult Health

## 2020-02-28 DIAGNOSIS — J40 Bronchitis, not specified as acute or chronic: Secondary | ICD-10-CM

## 2020-02-28 MED ORDER — AZITHROMYCIN 250 MG PO TABS: ORAL_TABLET | ORAL | 0 refills | Status: AC

## 2020-02-28 NOTE — Patient Instructions (Signed)
Z-Pak, to have on hold if symptoms worsen or do not improve with discolored mucus Mucinex DM twice daily as needed for cough and congestion Claritin 10 mg daily as needed for drainage Fluids and rest Saline nasal rinses as needed Flonase 1 to 2 puffs daily as needed for nasal congestion Follow-up with Dr. Elsworth Soho in 4 to 6 months and as needed Please contact office for sooner follow up if symptoms do not improve or worsen or seek emergency care

## 2020-02-28 NOTE — Telephone Encounter (Signed)
Spoke with the pt and notified of recs per TP  Televisit with TP for noon today

## 2020-02-28 NOTE — Telephone Encounter (Signed)
Spoke with the pt  She is c/o increased feeling of chest congestion for the past few days  She states she woke up this morning with "croupy cough"- non prod  She denies any increased SOB, wheezing, chest tightness, f/c/s  She is taking mucinex dm for the cough  She is requesting recs so that this does not get worse since it's a holiday wk  Please advise, thanks!

## 2020-02-28 NOTE — Progress Notes (Signed)
Virtual Visit via Telephone Note  I connected with Diana Stout on 02/28/20 at 12:00 PM EST by telephone and verified that I am speaking with the correct person using two identifiers.  Location: Patient: Home  Provider: Home Office    I discussed the limitations, risks, security and privacy concerns of performing an evaluation and management service by telephone and the availability of in person appointments. I also discussed with the patient that there may be a patient responsible charge related to this service. The patient expressed understanding and agreed to proceed.   History of Present Illness: 86 year old female never smoker followed for recurrent pneumonia  Today's televisit is an acute office visit.  Patient was last seen in February 2020.  She complains over the last few days of intermittent cough congestion nasal drainage.  Mucus has been clear.  She has been using Mucinex DM.  She denies any loss of taste or smell.  She is had no fever or body aches.  She has had Covid vaccine x3.  She is also had her influenza vaccine.  She says she has been doing very well up into the last few days. She has had some difficulty over this year with some recurrent urinary tract infections and is been on antibiotics several times.  She denies any chest pain orthopnea PND or increased leg swelling.   Past Medical History:  Diagnosis Date  . Abdominal pain, lower 11/22/2010   2015 resolved off Zocor 5/17?urticaria w/GI upset: Dtr is allergic to seafood - the pt is on MegaRed krill oil - d/c MegaRed   . Abnormal chest CT 05/22/2011   Followed in Pulmonary clinic/ [facility]/ [place]  05/14/11 CT CHEST WITHOUT CONTRAST 1. Multiple small pulmonary nodules scattered throughout the lungs  bilaterally ranging in size from 3-6 mm.    2. There is a small hiatal hernia.  3. Atherosclerosis.  4. Status post cholecystectomy.  5. Low attenuation hepatic lesions unchanged compared to recent  abdominal CT scan,  likely to represent cysts  . Acute pharyngitis 02/28/2007   Qualifier: Diagnosis of  By: [name] MD, [name]   . ANEMIA, NORMOCYTIC 08/10/2009   mild    . BRONCHITIS, ACUTE 06/12/2007   Qualifier: Diagnosis of  By: Plotnikov MD, [name] Canker sore 01/01/2017   9/18   x1  . CAP (community acquired pneumonia) 01/01/2017   9/18 probable L lung  . Chest pain, atypical 06/22/2014   06/22/14 ?etiology   . Cough 04/20/2007   Qualifier: Diagnosis of  By: Plotnikov MD, [name]   . Diarrhea 07/19/2013   4/15 x 1 mo ?etiol 8/18  . DIVERTICULOSIS, COLON 03/08/2007   Qualifier: Diagnosis of  By: [name] MD, [name]   . Dizziness 07/19/2013   Likely due to diarrhea/meds 4/15   . DVT (deep venous thrombosis) ([place]) 1998   hx of right  . DVT, HX OF 02/02/2007   Qualifier: Diagnosis of  By: [name]    . Dyslipidemia 02/02/2007   Chronic 1/13 worse    . Dyspnea 07/01/2008   2/13 improved w/reg exercise 1/17 deconditioning - CL stress test was nl    . Dysuria 05/18/2013   2/15, 1/17   . Edema 09/27/2009   Qualifier: Diagnosis of  By: [name], MD, [name]   . ESOPHAGEAL STRICTURE 03/06/2007   Qualifier: Diagnosis of  By: [name] MD, [name]   . Esophagitis 2006  . Essential hypertension 03/08/2007   Chronic  On Benicar   . Fatigue 10/15/2011   2015 resolved off Zocor   . FOOT PAIN 07/25/2009   R foot OA    . GERD 02/02/2007   Wedge pillow Protonix prn, Pepcid - d/c  . GLUCOSE INTOLERANCE 03/06/2007   Qualifier: Diagnosis of  By: [name] MD, [name]   . History of shingles   . Hyperlipidemia   . Hypoxemia 08/01/2009   Qualifier: Diagnosis of  By: Plotnikov MD, [name]   . IBS 03/06/2007   Qualifier: Diagnosis of  By: [name] MD, [name]   . Impaired glucose tolerance 11/22/2010  . Kidney cyst, acquired 07/22/2016   Remote   . Kidney cysts    left- Dr [name]  . Lung nodule 05/08/2011   04/15/11 RLL 4 mm nodule on abd CT - [facility] office   . Multiple lung nodules 2012   seen on CT scan  by Dr [name], pulmonologist  . Nausea alone 07/01/2008   2015 resolved off Zocor    . Obesity 01/03/2015  . Osteoporosis 02/02/2007   Chronic  Vit D, yoga  . Palpitations 05/12/2014   Dr [name] 2/16 poss due to steroid pack   . Pneumonia 12/2016  . Rash and nonspecific skin eruption 09/12/2015   5/17?urticaria Dtr is allergic to seafood - the pt is on MegaRed krill oil - d/c MegaRed   . Recurrent pneumonia 08/15/2009   Qualifier: Diagnosis of  By: Plotnikov MD, [name]   . Right-sided thoracic back pain 05/12/2014   2/16 MSK   . SHINGLES, HX OF 03/06/2007   Qualifier: Diagnosis of  By: [name] MD, [name]   . SINUSITIS- ACUTE-NOS 03/06/2007   Qualifier: Diagnosis of  By: [name] MD, [name]   . SKIN RASH 08/10/2009   Qualifier: Diagnosis of  By: Plotnikov MD, [name]   . Snoring 12/22/2013   Worse 9/15   . Upper airway cough syndrome 12/08/2014   Dr [name] Flutter valve added 01/02/2015 > resolved 02/03/2015  - rec try off gerd rx p 03/09/15   . URI, acute 01/01/2011   9/12, 7/13, 1/14, 2/15, 9/16   . UTI 07/19/2008   Qualifier: Diagnosis of  By: Plotnikov MD, [name]   . Wheezing 12/08/2014   Current Outpatient Medications on File Prior to Visit  Medication Sig Dispense Refill  . amoxicillin-clavulanate (AUGMENTIN) 875-125 MG tablet Take 1 tablet by mouth 2 (two) times daily. 14 tablet 0  . aspirin 81 MG tablet Take 81 mg by mouth daily.      . cholecalciferol (VITAMIN D) 1000 UNITS tablet Take 1,000 Units by mouth daily.    . cyanocobalamin 1000 MCG tablet Take 1,000 mcg by mouth daily.    . fluticasone (FLONASE) 50 MCG/ACT nasal spray Place 2 sprays into both nostrils as needed for rhinitis. 48 g 3  . furosemide (LASIX) 20 MG tablet TAKE 1 TABLET(20 MG) BY MOUTH DAILY AS NEEDED 90 tablet 3  . KLOR-CON M10 10 MEQ tablet Take 10 mEq by mouth daily. (Patient not taking: Reported on 01/31/2020)    . Magnesium 400 MG CAPS Take by mouth daily.    . nitroGLYCERIN (NITROSTAT) 0.4 MG SL tablet Place 1  tablet (0.4 mg total) under the tongue every 5 (five) minutes as needed for chest pain. 25 tablet 3  . potassium chloride (KLOR-CON) 10 MEQ tablet TAKE 1 TABLET(10 MEQ) BY MOUTH DAILY WITH FUROSEMIDE AS NEEDED 90 tablet 3  . Probiotic Product (ALIGN) 4 MG CAPS Take  1 capsule (4 mg total) by mouth daily. 30 capsule 0  . Red Yeast Rice Extract (RED YEAST RICE PO) Take 2 each by mouth daily.    Marland Kitchen Respiratory Therapy Supplies (FLUTTER) DEVI Use as directed 1 each 0   No current facility-administered medications on file prior to visit.       Observations/Objective: Speaks in full sentences with no audible distress or wheezing.  Assessment and Plan: Acute upper respiratory infection/early bronchitis.  Patient seems to be early in the process with no associated fever or discolored mucus. Have advised her to continue to treat symptoms.  We will give her antibiotic to have on hold however long discussion with her regarding antibiotic resistance and avoidance with viral infections. Low suspicion for COVID-19 as patient has no fever loss of taste or smell and has had Covid vaccine x3.  Plan  Patient Instructions  Z-Pak, to have on hold if symptoms worsen or do not improve with discolored mucus Mucinex DM twice daily as needed for cough and congestion Claritin 10 mg daily as needed for drainage Fluids and rest Saline nasal rinses as needed Flonase 1 to 2 puffs daily as needed for nasal congestion Follow-up with Dr. Elsworth Soho in 4 to 6 months and as needed Please contact office for sooner follow up if symptoms do not improve or worsen or seek emergency care      Follow Up Instructions: Follow-up in 4 to 6 months and as needed Please contact office for sooner follow up if symptoms do not improve or worsen or seek emergency care     I discussed the assessment and treatment plan with the patient. The patient was provided an opportunity to ask questions and all were answered. The patient agreed with  the plan and demonstrated an understanding of the instructions.   The patient was advised to call back or seek an in-person evaluation if the symptoms worsen or if the condition fails to improve as anticipated.  I provided 22 minutes of non-face-to-face time during this encounter.   Rexene Edison, NP

## 2020-02-28 NOTE — Telephone Encounter (Signed)
Last seen 05/2018 , will need televisit for evaluation  Can put on my schedule today for me.   Please contact office for sooner follow up if symptoms do not improve or worsen or seek emergency care

## 2020-03-15 DIAGNOSIS — Z87440 Personal history of urinary (tract) infections: Secondary | ICD-10-CM | POA: Diagnosis not present

## 2020-03-15 DIAGNOSIS — N3941 Urge incontinence: Secondary | ICD-10-CM | POA: Diagnosis not present

## 2020-03-28 DIAGNOSIS — M25562 Pain in left knee: Secondary | ICD-10-CM | POA: Diagnosis not present

## 2020-04-19 ENCOUNTER — Telehealth: Payer: Self-pay | Admitting: Internal Medicine

## 2020-04-19 ENCOUNTER — Ambulatory Visit: Payer: Medicare Other | Admitting: Internal Medicine

## 2020-04-19 NOTE — Telephone Encounter (Signed)
This is non-urgent so she needs to see her PCP

## 2020-04-19 NOTE — Telephone Encounter (Signed)
Patient wondering if she can be seen by Dr. Quay Burow for her 6 month follow up instead of being seen by Dr. Alain Marion. I tried to explain for follow up visits you need to be seen by your PCP but she still wanted me to ask since Dr. Quay Burow saw her for the issues she was having with UTIs. Just let me know what you would like me to do, Thanks.

## 2020-04-19 NOTE — Telephone Encounter (Signed)
Called patient and made 6 month follow up with Dr. Alain Marion

## 2020-04-25 ENCOUNTER — Ambulatory Visit: Payer: Medicare Other | Admitting: Internal Medicine

## 2020-04-26 DIAGNOSIS — N390 Urinary tract infection, site not specified: Secondary | ICD-10-CM | POA: Diagnosis not present

## 2020-04-26 DIAGNOSIS — N3000 Acute cystitis without hematuria: Secondary | ICD-10-CM | POA: Diagnosis not present

## 2020-06-19 ENCOUNTER — Encounter: Payer: Self-pay | Admitting: Gastroenterology

## 2020-08-15 DIAGNOSIS — Z85828 Personal history of other malignant neoplasm of skin: Secondary | ICD-10-CM | POA: Diagnosis not present

## 2020-08-15 DIAGNOSIS — L814 Other melanin hyperpigmentation: Secondary | ICD-10-CM | POA: Diagnosis not present

## 2020-08-15 DIAGNOSIS — L570 Actinic keratosis: Secondary | ICD-10-CM | POA: Diagnosis not present

## 2020-08-15 DIAGNOSIS — D2239 Melanocytic nevi of other parts of face: Secondary | ICD-10-CM | POA: Diagnosis not present

## 2020-08-15 DIAGNOSIS — L821 Other seborrheic keratosis: Secondary | ICD-10-CM | POA: Diagnosis not present

## 2020-08-29 ENCOUNTER — Other Ambulatory Visit: Payer: Self-pay

## 2020-08-29 ENCOUNTER — Ambulatory Visit (INDEPENDENT_AMBULATORY_CARE_PROVIDER_SITE_OTHER): Payer: PPO | Admitting: Obstetrics & Gynecology

## 2020-08-29 ENCOUNTER — Encounter (HOSPITAL_BASED_OUTPATIENT_CLINIC_OR_DEPARTMENT_OTHER): Payer: Self-pay | Admitting: Obstetrics & Gynecology

## 2020-08-29 VITALS — BP 127/59 | HR 84 | Ht <= 58 in | Wt 165.0 lb

## 2020-08-29 DIAGNOSIS — N281 Cyst of kidney, acquired: Secondary | ICD-10-CM

## 2020-08-29 DIAGNOSIS — M8580 Other specified disorders of bone density and structure, unspecified site: Secondary | ICD-10-CM

## 2020-08-29 DIAGNOSIS — Z01419 Encounter for gynecological examination (general) (routine) without abnormal findings: Secondary | ICD-10-CM | POA: Diagnosis not present

## 2020-08-29 DIAGNOSIS — Z86718 Personal history of other venous thrombosis and embolism: Secondary | ICD-10-CM | POA: Diagnosis not present

## 2020-08-29 DIAGNOSIS — Z1231 Encounter for screening mammogram for malignant neoplasm of breast: Secondary | ICD-10-CM

## 2020-08-29 DIAGNOSIS — R2689 Other abnormalities of gait and mobility: Secondary | ICD-10-CM | POA: Diagnosis not present

## 2020-08-29 NOTE — Progress Notes (Signed)
87 y.o. [id number] Widowed White or Caucasian female here for breast and pelvic exam. Had multiple UTIs last year.  Thinks she had about 7 of these last year.  Saw urologist in January.  She started taking vinegar and cranberry capsules.  This was not suggested by urologist but by a friend.l   It does seem to have worked for her.  Hasn't had another UTI since starting.  Denies vaginal bleeding.  Has not seen any blood in urine since last UTI which was the end of January, 2022.    D/w pt repeating BMD this year.  Desires to have it done here at [facility] location.  MMG not due until late October.  Will order together.  Pt reports she is feeling off balance a little more than in the past.  Feels she got somewhat deconditioned with stopping formal exercise during the pandemic.  Feels like gym downstairs could be good for her.  Referral for fitness assessment will be placed.  Patient's last menstrual period was 04/08/1996.          Sexually active: No.  H/O STD:  no  Health Maintenance: PCP:  Dr. Alain Marion.  Last wellness appt was 08/2019.  Did blood work at that appt:  yes Vaccines are up to date:  yes Colonoscopy:  05/29/2010.  No additional screening was recommended. MMG: 01/2020 BMD:  11/2014 Last pap smear:  02/2016.   H/o abnormal pap smear:  no   reports that she has never smoked. She has never used smokeless tobacco. She reports that she does not drink alcohol and does not use drugs.  Past Medical History:  Diagnosis Date  . Abdominal pain, lower 11/22/2010   2015 resolved off Zocor 5/17?urticaria w/GI upset: Dtr is allergic to seafood - the pt is on MegaRed krill oil - d/c MegaRed   . Abnormal chest CT 05/22/2011   Followed in Pulmonary clinic/ [facility]/ [place]  05/14/11 CT CHEST WITHOUT CONTRAST 1. Multiple small pulmonary nodules scattered throughout the lungs  bilaterally ranging in size from 3-6 mm.    2. There is a small hiatal hernia.  3. Atherosclerosis.  4. Status post  cholecystectomy.  5. Low attenuation hepatic lesions unchanged compared to recent  abdominal CT scan, likely to represent cysts  . ANEMIA, NORMOCYTIC 08/10/2009   mild    . DIVERTICULOSIS, COLON 03/08/2007   Qualifier: Diagnosis of  By: [name] MD, [name]   . Dizziness 07/19/2013   Likely due to diarrhea/meds 4/15   . DVT (deep venous thrombosis) ([place]) 1998   hx of right  . Dyslipidemia 02/02/2007   Chronic 1/13 worse    . Dyspnea 07/01/2008   2/13 improved w/reg exercise 1/17 deconditioning - CL stress test was nl    . Edema 09/27/2009   Qualifier: Diagnosis of  By: [name], MD, [name]   . ESOPHAGEAL STRICTURE 03/06/2007   Qualifier: Diagnosis of  By: [name] MD, [name]   . Esophagitis 2006  . Essential hypertension 03/08/2007   Chronic  On Benicar   . FOOT PAIN 07/25/2009   R foot OA    . GERD 02/02/2007   Wedge pillow Protonix prn, Pepcid - d/c  . History of pneumonia 12/2016  . History of shingles   . Hyperlipidemia   . IBS 03/06/2007   Qualifier: Diagnosis of  By: [name] MD, [name]   . Impaired glucose tolerance 11/22/2010  . Kidney cysts    left- Dr [name]  . Lung nodule 05/08/2011  04/15/11 RLL 4 mm nodule on abd CT - [facility] office   . Obesity 01/03/2015  . Osteoporosis 02/02/2007   Chronic  Vit D, yoga  . Palpitations 05/12/2014   Dr [name] 2/16 poss due to steroid pack   . Rash and nonspecific skin eruption 09/12/2015   5/17?urticaria Pt is allergic to seafood - the pt is on MegaRed krill oil - d/c MegaRed   . Upper airway cough syndrome 12/08/2014   Dr [name] Flutter valve added 01/02/2015 > resolved 02/03/2015  - rec try off gerd rx p 03/09/15     Past Surgical History:  Procedure Laterality Date  . APPENDECTOMY    . CHOLECYSTECTOMY    . HYSTEROSCOPY WITH RESECTOSCOPE  7/99   resect polyp  . MOHS SURGERY  05/2018   face  . TUBAL LIGATION Bilateral     Current Outpatient Medications  Medication Sig Dispense Refill  . Apple Cider Vinegar (APPLE CIDER VINEGAR  ULTRA) 600 MG CAPS Take by mouth.    Marland Kitchen aspirin 81 MG tablet Take 81 mg by mouth daily.    . cholecalciferol (VITAMIN D) 1000 UNITS tablet Take 1,000 Units by mouth daily.    . cyanocobalamin 1000 MCG tablet Take 1,000 mcg by mouth daily.    . fluticasone (FLONASE) 50 MCG/ACT nasal spray Place 2 sprays into both nostrils as needed for rhinitis. 48 g 3  . furosemide (LASIX) 20 MG tablet TAKE 1 TABLET(20 MG) BY MOUTH DAILY AS NEEDED 90 tablet 3  . KLOR-CON M10 10 MEQ tablet Take 10 mEq by mouth daily.    . Magnesium 400 MG CAPS Take by mouth daily.    . nitroGLYCERIN (NITROSTAT) 0.4 MG SL tablet Place 1 tablet (0.4 mg total) under the tongue every 5 (five) minutes as needed for chest pain. 25 tablet 3  . potassium chloride (KLOR-CON) 10 MEQ tablet TAKE 1 TABLET(10 MEQ) BY MOUTH DAILY WITH FUROSEMIDE AS NEEDED 90 tablet 3  . Probiotic Product (ALIGN) 4 MG CAPS Take 1 capsule (4 mg total) by mouth daily. 30 capsule 0  . Red Yeast Rice Extract (RED YEAST RICE PO) Take 2 each by mouth daily.    Marland Kitchen Respiratory Therapy Supplies (FLUTTER) DEVI Use as directed 1 each 0   No current facility-administered medications for this visit.    Family History  Problem Relation Age of Onset  . Asthma Mother   . Diabetes Father   . Cancer Father        liver  . Cancer Sister        breast  . Diabetes Sister   . Asthma Sister   . Breast cancer Sister   . Diabetes Brother   . Asthma Brother   . Asthma Sister   . Diabetes Brother   . Coronary artery disease Other        1 st degree female relative  . Thyroid disease Maternal Uncle     Review of Systems  Constitutional: Negative.   Gastrointestinal: Negative.   Genitourinary: Negative.   Psychiatric/Behavioral: Negative.     Exam:   BP (!) 127/59   Pulse 84   Ht 4' 9.75" (1.467 m)   Wt 165 lb (74.8 kg)   LMP 04/08/1996   BMI 34.78 kg/m   Height: 4' 9.75" (146.7 cm)  General appearance: alert, cooperative and appears stated age Breasts: normal  appearance, no masses or tenderness Abdomen: soft, non-tender; bowel sounds normal; no masses,  no organomegaly Lymph nodes: Cervical, supraclavicular, and axillary  nodes normal.  No abnormal inguinal nodes palpated Neurologic: Grossly normal  Pelvic: External genitalia:  no lesions              Urethra:  normal appearing urethra with no masses, tenderness or lesions              Bartholins and Skenes: normal                 Vagina: normal appearing vagina with atrophic changes and no discharge, no lesions              Cervix: no lesions              Pap taken: No. Bimanual Exam:  Uterus:  normal size, contour, position, consistency, mobility, non-tender              Adnexa: normal adnexa and no mass, fullness, tenderness               Rectovaginal: Confirms               Anus:  normal sphincter tone, no lesions  Chaperone, Octaviano Batty, CMA, was present for exam.  Assessment/Plan: 1. Gynecologic exam normal - last pap smear 2017.  Not indicated due to benign hx and age - MMG 01/2020 - BMD 11/2014 - colonoscopy no long indicated for screening due to age - vaccines updates - lab work done with Dr. Alain Marion.    2. History of DVT (deep vein thrombosis) - d/w pt she should not use topical estrogen products if recurrent UTIs return  3. Kidney cyst, acquired - last renal US 07/2019, reviewed today.  Cyst is mildly larger.  Followed by urology.  4. Osteopenia, unspecified location - DG BONE DENSITY (DXA); Future  5. Balance problems - Ambulatory referral to [facility]

## 2020-08-30 ENCOUNTER — Encounter (HOSPITAL_BASED_OUTPATIENT_CLINIC_OR_DEPARTMENT_OTHER): Payer: Self-pay | Admitting: Obstetrics & Gynecology

## 2020-09-03 ENCOUNTER — Other Ambulatory Visit: Payer: Self-pay | Admitting: Internal Medicine

## 2020-09-13 DIAGNOSIS — Z87440 Personal history of urinary (tract) infections: Secondary | ICD-10-CM | POA: Diagnosis not present

## 2020-09-13 DIAGNOSIS — N3941 Urge incontinence: Secondary | ICD-10-CM | POA: Diagnosis not present

## 2020-11-26 ENCOUNTER — Other Ambulatory Visit: Payer: Self-pay | Admitting: Internal Medicine

## 2020-11-28 ENCOUNTER — Other Ambulatory Visit: Payer: Self-pay | Admitting: Internal Medicine

## 2020-11-29 ENCOUNTER — Ambulatory Visit (INDEPENDENT_AMBULATORY_CARE_PROVIDER_SITE_OTHER): Payer: PPO | Admitting: Internal Medicine

## 2020-11-29 ENCOUNTER — Other Ambulatory Visit: Payer: Self-pay

## 2020-11-29 ENCOUNTER — Encounter: Payer: Self-pay | Admitting: Internal Medicine

## 2020-11-29 VITALS — BP 120/78 | HR 84 | Temp 97.9°F | Ht <= 58 in | Wt 161.8 lb

## 2020-11-29 DIAGNOSIS — R609 Edema, unspecified: Secondary | ICD-10-CM | POA: Diagnosis not present

## 2020-11-29 DIAGNOSIS — I2510 Atherosclerotic heart disease of native coronary artery without angina pectoris: Secondary | ICD-10-CM | POA: Diagnosis not present

## 2020-11-29 DIAGNOSIS — Z0000 Encounter for general adult medical examination without abnormal findings: Secondary | ICD-10-CM | POA: Diagnosis not present

## 2020-11-29 DIAGNOSIS — R2689 Other abnormalities of gait and mobility: Secondary | ICD-10-CM

## 2020-11-29 DIAGNOSIS — I10 Essential (primary) hypertension: Secondary | ICD-10-CM

## 2020-11-29 DIAGNOSIS — E785 Hyperlipidemia, unspecified: Secondary | ICD-10-CM | POA: Diagnosis not present

## 2020-11-29 LAB — CBC WITH DIFFERENTIAL/PLATELET
Basophils Absolute: 0 10*3/uL (ref 0.0–0.1)
Basophils Relative: 0.6 % (ref 0.0–3.0)
Eosinophils Absolute: 0.2 10*3/uL (ref 0.0–0.7)
Eosinophils Relative: 3.1 % (ref 0.0–5.0)
HCT: 38.2 % (ref 36.0–46.0)
Hemoglobin: 12.4 g/dL (ref 12.0–15.0)
Lymphocytes Relative: 30.9 % (ref 12.0–46.0)
Lymphs Abs: 1.5 10*3/uL (ref 0.7–4.0)
MCHC: 32.5 g/dL (ref 30.0–36.0)
MCV: 86 fl (ref 78.0–100.0)
Monocytes Absolute: 0.5 10*3/uL (ref 0.1–1.0)
Monocytes Relative: 10 % (ref 3.0–12.0)
Neutro Abs: 2.7 10*3/uL (ref 1.4–7.7)
Neutrophils Relative %: 55.4 % (ref 43.0–77.0)
Platelets: 200 10*3/uL (ref 150.0–400.0)
RBC: 4.44 Mil/uL (ref 3.87–5.11)
RDW: 15.4 % (ref 11.5–15.5)
WBC: 4.9 10*3/uL (ref 4.0–10.5)

## 2020-11-29 LAB — COMPREHENSIVE METABOLIC PANEL
ALT: 12 U/L (ref 0–35)
AST: 17 U/L (ref 0–37)
Albumin: 3.9 g/dL (ref 3.5–5.2)
Alkaline Phosphatase: 95 U/L (ref 39–117)
BUN: 17 mg/dL (ref 6–23)
CO2: 29 mEq/L (ref 19–32)
Calcium: 9.7 mg/dL (ref 8.4–10.5)
Chloride: 104 mEq/L (ref 96–112)
Creatinine, Ser: 0.79 mg/dL (ref 0.40–1.20)
GFR: 67.1 mL/min (ref >60.00)
Glucose, Bld: 98 mg/dL (ref 70–99)
Potassium: 4.5 mEq/L (ref 3.5–5.1)
Sodium: 141 mEq/L (ref 135–145)
Total Bilirubin: 0.6 mg/dL (ref 0.2–1.2)
Total Protein: 7.3 g/dL (ref 6.0–8.3)

## 2020-11-29 LAB — URINALYSIS, ROUTINE W REFLEX MICROSCOPIC
Bilirubin Urine: NEGATIVE
Hgb urine dipstick: NEGATIVE
Ketones, ur: NEGATIVE
Nitrite: NEGATIVE
RBC / HPF: NONE SEEN (ref 0–?)
Specific Gravity, Urine: 1.025 (ref 1.000–1.030)
Total Protein, Urine: NEGATIVE
Urine Glucose: NEGATIVE
Urobilinogen, UA: 0.2 (ref 0.0–1.0)
pH: 5.5 (ref 5.0–8.0)

## 2020-11-29 LAB — LIPID PANEL
Cholesterol: 206 mg/dL — ABNORMAL HIGH (ref 0–200)
HDL: 47 mg/dL (ref >39.00)
LDL Cholesterol: 139 mg/dL — ABNORMAL HIGH (ref 0–99)
NonHDL: 159.33
Total CHOL/HDL Ratio: 4
Triglycerides: 102 mg/dL (ref 0.0–149.0)
VLDL: 20.4 mg/dL (ref 0.0–40.0)

## 2020-11-29 LAB — TSH: TSH: 0.95 u[IU]/mL (ref 0.35–5.50)

## 2020-11-29 MED ORDER — KLOR-CON M10 10 MEQ PO TBCR
10.0000 meq | EXTENDED_RELEASE_TABLET | Freq: Every day | ORAL | 3 refills | Status: DC
Start: 2020-11-29 — End: 2021-06-06

## 2020-11-29 MED ORDER — FUROSEMIDE 20 MG PO TABS: ORAL_TABLET | ORAL | 1 refills | Status: DC

## 2020-11-29 NOTE — Assessment & Plan Note (Signed)
Compression socks Lasix, Kdur prn

## 2020-11-29 NOTE — Progress Notes (Signed)
Subjective:  Patient ID: Diana Stout, female    DOB: 01-05-1931  Age: 87 y.o. MRN: [id number]  CC: Follow-up   HPI [name] presents for LBP, OAB, OA Well exam. Ms. Stout is looking at Senior living options like evidence of what.  No solid plans to move yet.  She continues to drive.  She noticed that her balance is slightly worse lately.  Outpatient Medications Prior to Visit  Medication Sig Dispense Refill   Apple Cider Vinegar (APPLE CIDER VINEGAR ULTRA) 600 MG CAPS Take by mouth.     aspirin 81 MG tablet Take 81 mg by mouth daily.     cholecalciferol (VITAMIN D) 1000 UNITS tablet Take 1,000 Units by mouth daily.     cyanocobalamin 1000 MCG tablet Take 1,000 mcg by mouth daily.     fluticasone (FLONASE) 50 MCG/ACT nasal spray Place 2 sprays into both nostrils as needed for rhinitis. 48 g 3   furosemide (LASIX) 20 MG tablet TAKE 1 TABLET(20 MG) BY MOUTH DAILY AS NEEDED 30 tablet 0   KLOR-CON M10 10 MEQ tablet Take 10 mEq by mouth daily.     Magnesium 400 MG CAPS Take by mouth daily.     nitroGLYCERIN (NITROSTAT) 0.4 MG SL tablet Place 1 tablet (0.4 mg total) under the tongue every 5 (five) minutes as needed for chest pain. 25 tablet 3   Probiotic Product (ALIGN) 4 MG CAPS Take 1 capsule (4 mg total) by mouth daily. 30 capsule 0   Red Yeast Rice Extract (RED YEAST RICE PO) Take 2 each by mouth daily.     Respiratory Therapy Supplies (FLUTTER) DEVI Use as directed 1 each 0   potassium chloride (KLOR-CON) 10 MEQ tablet TAKE 1 TABLET(10 MEQ) BY MOUTH DAILY WITH FUROSEMIDE AS NEEDED 90 tablet 3   No facility-administered medications prior to visit.    ROS: Review of Systems  Constitutional:  Negative for activity change, appetite change, chills, fatigue and unexpected weight change.  HENT:  Negative for congestion, mouth sores and sinus pressure.   Eyes:  Negative for visual disturbance.  Respiratory:  Negative for cough and chest tightness.   Gastrointestinal:  Negative  for abdominal pain and nausea.  Genitourinary:  Negative for difficulty urinating, frequency and vaginal pain.  Musculoskeletal:  Positive for arthralgias and gait problem. Negative for back pain.  Skin:  Negative for pallor and rash.  Neurological:  Negative for dizziness, tremors, weakness, numbness and headaches.  Psychiatric/Behavioral:  Negative for confusion and sleep disturbance.    Objective:  BP 120/78 (BP Location: Left Arm)   Pulse 84   Temp 97.9 F (36.6 C) (Oral)   Ht 4' 9.75" (1.467 m)   Wt 161 lb 12.8 oz (73.4 kg)   LMP 04/08/1996   SpO2 95%   BMI 34.11 kg/m   BP Readings from Last 3 Encounters:  11/29/20 120/78  08/29/20 (!) 127/59  01/31/20 126/78    Wt Readings from Last 3 Encounters:  11/29/20 161 lb 12.8 oz (73.4 kg)  08/29/20 165 lb (74.8 kg)  01/31/20 161 lb (73 kg)    Physical Exam Constitutional:      General: She is not in acute distress.    Appearance: She is well-developed. She is obese.  HENT:     Head: Normocephalic.     Right Ear: External ear normal.     Left Ear: External ear normal.     Nose: Nose normal.  Eyes:     General:  Right eye: No discharge.        Left eye: No discharge.     Conjunctiva/sclera: Conjunctivae normal.     Pupils: Pupils are equal, round, and reactive to light.  Neck:     Thyroid: No thyromegaly.     Vascular: No JVD.     Trachea: No tracheal deviation.  Cardiovascular:     Rate and Rhythm: Normal rate and regular rhythm.     Heart sounds: Normal heart sounds.  Pulmonary:     Effort: No respiratory distress.     Breath sounds: No stridor. No wheezing.  Abdominal:     General: Bowel sounds are normal. There is no distension.     Palpations: Abdomen is soft. There is no mass.     Tenderness: There is no abdominal tenderness. There is no guarding or rebound.  Musculoskeletal:        General: No tenderness.     Cervical back: Normal range of motion and neck supple. No rigidity.  Lymphadenopathy:      Cervical: No cervical adenopathy.  Skin:    Findings: No erythema or rash.  Neurological:     Cranial Nerves: No cranial nerve deficit.     Motor: No abnormal muscle tone.     Coordination: Coordination normal.     Deep Tendon Reflexes: Reflexes normal.  Psychiatric:        Behavior: Behavior normal.        Thought Content: Thought content normal.        Judgment: Judgment normal.  Trace edema Hearing aid x1 Slight ataxia  Lab Results  Component Value Date   WBC 7.8 08/26/2019   HGB 11.8 (L) 08/26/2019   HCT 36.3 08/26/2019   PLT 178.0 08/26/2019   GLUCOSE 98 08/26/2019   CHOL 205 (H) 08/26/2019   TRIG 107.0 08/26/2019   HDL 44.90 08/26/2019   LDLDIRECT 154.9 05/01/2011   LDLCALC 139 (H) 08/26/2019   ALT 16 08/26/2019   AST 20 08/26/2019   NA 139 08/26/2019   K 3.6 08/26/2019   CL 104 08/26/2019   CREATININE 0.67 08/26/2019   BUN 18 08/26/2019   CO2 30 08/26/2019   TSH 0.42 08/26/2019   HGBA1C 5.9 08/26/2019    US Renal  Result Date: 07/20/2019 CLINICAL DATA:  Follow-up left renal cyst. EXAM: RENAL / URINARY TRACT ULTRASOUND COMPLETE COMPARISON:  Ultrasound 08/01/2016, 02/27/2010. CT 09/30/2012, 03/08/2011. FINDINGS: Right Kidney: Renal measurements: 8.3 x 4.7 x 4.8 cm = volume: 97.7 mL . Echogenicity within normal limits. No mass or hydronephrosis visualized. Left Kidney: Renal measurements: 11.5 x 5.2 x 4.8 cm = volume: 150.3 mL. Echogenicity within normal limits. 5.1 x 4.5 x 3.9 cm thinly septated cyst noted over the left lower renal pole. This cyst has increased slightly in size from prior exams but again has a benign appearance. No hydronephrosis visualized. Bladder: Appears normal for degree of bladder distention. Other: None. IMPRESSION: 1. 5.1 x 4.5 x 3.9 cm thinly septated cyst noted the left lower renal pole. This cyst has increased slightly in size from prior exams but again has a benign appearance. 2.  No other focal abnormalities identified.  No  hydronephrosis. Electronically Signed   By: Marcello Moores  Register   On: 07/20/2019 06:50    Assessment & Plan:     Walker Kehr, MD

## 2020-11-29 NOTE — Assessment & Plan Note (Signed)
  We discussed age appropriate health related issues, including available/recomended screening tests and vaccinations. Labs were ordered to be later reviewed . All questions were answered. We discussed one or more of the following - seat belt use, use of sunscreen/sun exposure exercise, fall risk reduction, second hand smoke exposure, firearm use and storage, seat belt use, a need for adhering to healthy diet and exercise. Labs were ordered.  All questions were answered. Diana Stout is looking at Senior living options like evidence of what.  No solid plans to move yet.  She continues to drive.  She noticed that her balance is slightly worse lately. She can try to use trekking poles for walking

## 2020-11-29 NOTE — Assessment & Plan Note (Signed)
Wt Readings from Last 3 Encounters:  11/29/20 161 lb 12.8 oz (73.4 kg)  08/29/20 165 lb (74.8 kg)  01/31/20 161 lb (73 kg)  Pt lost wt - BP - good NAS Furosemide 2/wk, Kdur

## 2020-11-29 NOTE — Assessment & Plan Note (Signed)
Diana Stout noticed that her balance is slightly worse lately.  Fall prevention discussed. She can try to use trekking poles for walking Exercise program

## 2020-11-29 NOTE — Assessment & Plan Note (Signed)
Re-start ASA 81 mg/d

## 2020-11-29 NOTE — Patient Instructions (Signed)
Try trekking poles Compression socks

## 2020-12-12 ENCOUNTER — Other Ambulatory Visit: Payer: Self-pay | Admitting: Internal Medicine

## 2020-12-18 NOTE — Progress Notes (Signed)
Subjective:    Patient ID: Diana Stout, female    DOB: 22-Sep-1930, 87 y.o.   MRN: [id number]  This visit occurred during the SARS-CoV-2 public health emergency.  Safety protocols were in place, including screening questions prior to the visit, additional usage of staff PPE, and extensive cleaning of exam room while observing appropriate contact time as indicated for disinfecting solutions.    HPI The patient is here for an acute visit.   Dizziness - her dizziness started last week, several days ago.  She had transient pain in her left orbit and clogged in her left ear, but both have resolved.  She felt like she was on a boat - no true spinning.    Her balance has been off for a while.  She has been less active due to the heat.        Medications and allergies reviewed with patient and updated if appropriate.  Patient Active Problem List   Diagnosis Date Noted   Balance problem 11/29/2020   Acute cystitis with hematuria 01/31/2020   Recurrent UTI 01/31/2020   Low back pain 10/13/2019   CAD (coronary artery disease) 03/04/2018   Lightheadedness 11/05/2017   Canker sore 01/01/2017   Kidney cyst, acquired 07/22/2016   Rash and nonspecific skin eruption 09/12/2015   Obesity 01/03/2015   Upper airway cough syndrome 12/08/2014   Right-sided thoracic back pain 05/12/2014   Palpitations 05/12/2014   Snoring 12/22/2013   Diarrhea 07/19/2013   Dizziness 07/19/2013   Fatigue 10/15/2011   Impaired glucose tolerance 11/22/2010   Well adult exam 11/22/2010   Abdominal pain, lower 11/22/2010   SYNCOPE 09/27/2009   Edema 09/27/2009   Recurrent pneumonia 08/15/2009   ANEMIA, NORMOCYTIC 08/10/2009   SKIN RASH 08/10/2009   FOOT PAIN 07/25/2009   Dyspnea 07/01/2008   Bronchitis 06/12/2007   Essential hypertension 03/08/2007   DIVERTICULOSIS, COLON 03/08/2007   ESOPHAGEAL STRICTURE 03/06/2007   IBS 03/06/2007   SHINGLES, HX OF 03/06/2007   Dyslipidemia 02/02/2007   GERD  02/02/2007   Osteoporosis 02/02/2007   DVT, HX OF 02/02/2007    Current Outpatient Medications on File Prior to Visit  Medication Sig Dispense Refill   Apple Cider Vinegar (APPLE CIDER VINEGAR ULTRA) 600 MG CAPS Take by mouth.     aspirin 81 MG tablet Take 81 mg by mouth daily.     cholecalciferol (VITAMIN D) 1000 UNITS tablet Take 1,000 Units by mouth daily.     cyanocobalamin 1000 MCG tablet Take 1,000 mcg by mouth daily.     fluticasone (FLONASE) 50 MCG/ACT nasal spray USE 2 SPRAYS IN EACH NOSTRIL AS NEEDED FOR RHINITIS 48 g 3   furosemide (LASIX) 20 MG tablet 1 po qd prn 90 tablet 1   KLOR-CON M10 10 MEQ tablet Take 1 tablet (10 mEq total) by mouth daily. 90 tablet 3   Magnesium 400 MG CAPS Take by mouth daily.     nitroGLYCERIN (NITROSTAT) 0.4 MG SL tablet Place 1 tablet (0.4 mg total) under the tongue every 5 (five) minutes as needed for chest pain. 25 tablet 3   Probiotic Product (ALIGN) 4 MG CAPS Take 1 capsule (4 mg total) by mouth daily. 30 capsule 0   Red Yeast Rice Extract (RED YEAST RICE PO) Take 2 each by mouth daily.     Respiratory Therapy Supplies (FLUTTER) DEVI Use as directed 1 each 0   No current facility-administered medications on file prior to visit.    Past Medical History:  Diagnosis Date   Abdominal pain, lower 11/22/2010   2015 resolved off Zocor 5/17?urticaria w/GI upset: Dtr is allergic to seafood - the pt is on MegaRed krill oil - d/c MegaRed    Abnormal chest CT 05/22/2011   Followed in Pulmonary clinic/ [facility]/ [place]  05/14/11 CT CHEST WITHOUT CONTRAST 1. Multiple small pulmonary nodules scattered throughout the lungs  bilaterally ranging in size from 3-6 mm.    2. There is a small hiatal hernia.  3. Atherosclerosis.  4. Status post cholecystectomy.  5. Low attenuation hepatic lesions unchanged compared to recent  abdominal CT scan, likely to represent cysts   ANEMIA, NORMOCYTIC 08/10/2009   mild     DIVERTICULOSIS, COLON 03/08/2007   Qualifier:  Diagnosis of  By: [name] MD, [name]    Dizziness 07/19/2013   Likely due to diarrhea/meds 4/15    DVT (deep venous thrombosis) ([place]) 1998   hx of right   Dyslipidemia 02/02/2007   Chronic 1/13 worse     Dyspnea 07/01/2008   2/13 improved w/reg exercise 1/17 deconditioning - CL stress test was nl     Edema 09/27/2009   Qualifier: Diagnosis of  By: [name], MD, [name]    ESOPHAGEAL STRICTURE 03/06/2007   Qualifier: Diagnosis of  By: [name] MD, [name]    Esophagitis 2006   Essential hypertension 03/08/2007   Chronic  On Benicar    FOOT PAIN 07/25/2009   R foot OA     GERD 02/02/2007   Wedge pillow Protonix prn, Pepcid - d/c   History of pneumonia 12/2016   History of shingles    Hyperlipidemia    IBS 03/06/2007   Qualifier: Diagnosis of  By: [name] MD, [name]    Impaired glucose tolerance 11/22/2010   Kidney cysts    left- Dr [name]   Lung nodule 05/08/2011   04/15/11 RLL 4 mm nodule on abd CT - [facility] office    Obesity 01/03/2015   Osteoporosis 02/02/2007   Chronic  Vit D, yoga   Palpitations 05/12/2014   Dr [name] 2/16 poss due to steroid pack    Rash and nonspecific skin eruption 09/12/2015   5/17?urticaria Pt is allergic to seafood - the pt is on MegaRed krill oil - d/c MegaRed    Upper airway cough syndrome 12/08/2014   Dr [name] Flutter valve added 01/02/2015 > resolved 02/03/2015  - rec try off gerd rx p 03/09/15     Past Surgical History:  Procedure Laterality Date   APPENDECTOMY     CHOLECYSTECTOMY     HYSTEROSCOPY WITH RESECTOSCOPE  7/99   resect polyp   MOHS SURGERY  05/2018   face   TUBAL LIGATION Bilateral     Social History   Socioeconomic History   Marital status: Widowed    Spouse name: Not on file   Number of children: 3   Years of education: Not on file   Highest education level: Not on file  Occupational History   Occupation: Retired    Fish farm manager: RETIRED  Tobacco Use   Smoking status: Never   Smokeless tobacco: Never  Vaping Use   Vaping  Use: Never used  Substance and Sexual Activity   Alcohol use: No   Drug use: No   Sexual activity: Not Currently  Other Topics Concern   Not on file  Social History Narrative   Not on file   Social Determinants of Health   Financial Resource Strain: Not on file  Food  Insecurity: Not on file  Transportation Needs: Not on file  Physical Activity: Not on file  Stress: Not on file  Social Connections: Not on file    Family History  Problem Relation Age of Onset   Asthma Mother    Diabetes Father    Cancer Father        liver   Cancer Sister        breast   Diabetes Sister    Asthma Sister    Breast cancer Sister    Diabetes Brother    Asthma Brother    Asthma Sister    Diabetes Brother    Coronary artery disease Other        1 st degree female relative   Thyroid disease Maternal Uncle     Review of Systems  Constitutional:  Negative for chills and fever.  HENT:  Positive for tinnitus and voice change. Negative for congestion, ear pain (left ear clogged), hearing loss, sinus pain, sore throat and trouble swallowing.   Eyes:  Negative for visual disturbance.  Respiratory:  Negative for cough, shortness of breath and wheezing.   Cardiovascular:  Positive for leg swelling (ankles at times). Negative for chest pain and palpitations.  Gastrointestinal:  Negative for nausea.  Genitourinary:  Negative for dysuria.  Neurological:  Positive for dizziness. Negative for speech difficulty, weakness, numbness (sometimes tingling in legs) and headaches.      Objective:   Vitals:   12/19/20 1417  BP: 124/78  Pulse: 100  Temp: 98 F (36.7 C)  SpO2: 95%   BP Readings from Last 3 Encounters:  12/19/20 124/78  11/29/20 120/78  08/29/20 (!) 127/59   Wt Readings from Last 3 Encounters:  12/19/20 165 lb (74.8 kg)  11/29/20 161 lb 12.8 oz (73.4 kg)  08/29/20 165 lb (74.8 kg)   Body mass index is 34.78 kg/m.   Physical Exam Constitutional:      General: She is not in  acute distress.    Appearance: Normal appearance. She is not ill-appearing.  HENT:     Head: Normocephalic and atraumatic.     Right Ear: Tympanic membrane, ear canal and external ear normal. There is no impacted cerumen.     Left Ear: Tympanic membrane, ear canal and external ear normal. There is no impacted cerumen.     Mouth/Throat:     Mouth: Mucous membranes are moist.     Pharynx: No posterior oropharyngeal erythema.  Eyes:     Extraocular Movements: Extraocular movements intact.     Conjunctiva/sclera: Conjunctivae normal.  Neck:     Vascular: No carotid bruit.  Cardiovascular:     Rate and Rhythm: Normal rate and regular rhythm.  Pulmonary:     Effort: Pulmonary effort is normal. No respiratory distress.     Breath sounds: No wheezing or rales.  Musculoskeletal:     Cervical back: Neck supple. No tenderness.     Right lower leg: No edema.     Left lower leg: No edema.  Lymphadenopathy:     Cervical: No cervical adenopathy.  Skin:    General: Skin is warm and dry.  Neurological:     Mental Status: She is alert and oriented to person, place, and time.     Cranial Nerves: No cranial nerve deficit.     Sensory: No sensory deficit.     Motor: No weakness.           Assessment & Plan:    Vertigo: Started several days  ago Has improved some Worse with head movements Transient left orbital pain and feeling of clogged ear - both resolved No acute change in gait No neurological deficit Likely BPPV Will get MRI given age and risk factors Can try meclizine 12.5 mg 3 times daily as needed-discussed with her that this can cause drowsiness and may only be able to take it at night Most likely symptoms will improve over the next few days Can consider vestibular PT She will call with any questions or concerns

## 2020-12-19 ENCOUNTER — Encounter: Payer: Self-pay | Admitting: Internal Medicine

## 2020-12-19 ENCOUNTER — Other Ambulatory Visit: Payer: Self-pay

## 2020-12-19 ENCOUNTER — Ambulatory Visit (INDEPENDENT_AMBULATORY_CARE_PROVIDER_SITE_OTHER): Payer: PPO | Admitting: Internal Medicine

## 2020-12-19 VITALS — BP 124/78 | HR 100 | Temp 98.0°F | Ht <= 58 in | Wt 165.0 lb

## 2020-12-19 DIAGNOSIS — R42 Dizziness and giddiness: Secondary | ICD-10-CM | POA: Diagnosis not present

## 2020-12-19 MED ORDER — MECLIZINE HCL 12.5 MG PO TABS: 12.5000 mg | ORAL_TABLET | Freq: Three times a day (TID) | ORAL | 0 refills | Status: DC | PRN

## 2020-12-19 NOTE — Patient Instructions (Addendum)
An MRI of your brain was ordered - someone will call you to schedule this.     Medications changes include :  meclizine 12.5 mg three times a day as needed - this may cause drowsiness    Your prescription(s) have been submitted to your pharmacy. Please take as directed and contact our office if you believe you are having problem(s) with the medication(s).   Please call if there is no improvement in your symptoms.     Vertigo Vertigo is the feeling that you or your surroundings are moving when they are not. This feeling can come and go at any time. Vertigo often goes away on its own. Vertigo can be dangerous if it occurs while you are doing something that could endanger yourself or others, such as driving or operating machinery. Your health care provider will do tests to try to determine the cause of your vertigo. Tests will also help your health care provider decide how best to treat your condition. Follow these instructions at home: Eating and drinking   Dehydration can make vertigo worse. Drink enough fluid to keep your urine pale yellow. Do not drink alcohol. Activity Return to your normal activities as told by your health care provider. Ask your health care provider what activities are safe for you. In the morning, first sit up on the side of the bed. When you feel okay, stand slowly while you hold onto something until you know that your balance is fine. Move slowly. Avoid sudden body or head movements or certain positions, as told by your health care provider. If you have trouble walking or keeping your balance, try using a cane for stability. If you feel dizzy or unstable, sit down right away. Avoid doing any tasks that would cause danger to you or others if vertigo occurs. Avoid bending down if you feel dizzy. Place items in your home so that they are easy for you to reach without bending or leaning over. Do not drive or use machinery if you feel dizzy. General  instructions Take over-the-counter and prescription medicines only as told by your health care provider. Keep all follow-up visits. This is important. Contact a health care provider if: Your medicines do not relieve your vertigo or they make it worse. Your condition gets worse or you develop new symptoms. You have a fever. You develop nausea or vomiting, or if nausea gets worse. Your family or friends notice any behavioral changes. You have numbness or a prickling and tingling sensation in part of your body. Get help right away if you: Are always dizzy or you faint. Develop severe headaches. Develop a stiff neck. Develop sensitivity to light. Have difficulty moving or speaking. Have weakness in your hands, arms, or legs. Have changes in your hearing or vision. These symptoms may represent a serious problem that is an emergency. Do not wait to see if the symptoms will go away. Get medical help right away. Call your local emergency services (911 in the [place] Do not drive yourself to the hospital. Summary Vertigo is the feeling that you or your surroundings are moving when they are not. Your health care provider will do tests to try to determine the cause of your vertigo. Follow instructions for home care. You may be told to avoid certain tasks, positions, or movements. Contact a health care provider if your medicines do not relieve your symptoms, or if you have a fever, nausea, vomiting, or changes in behavior. Get help right away if you  have severe headaches or difficulty speaking, or you develop hearing or vision problems. This information is not intended to replace advice given to you by your health care provider. Make sure you discuss any questions you have with your health care provider. Document Revised: 02/23/2020 Document Reviewed: 02/23/2020 Elsevier Patient Education  2022 [employer].

## 2020-12-22 ENCOUNTER — Other Ambulatory Visit: Payer: Self-pay

## 2020-12-22 ENCOUNTER — Ambulatory Visit
Admission: RE | Admit: 2020-12-22 | Discharge: 2020-12-22 | Disposition: A | Discharge status: Home / Self Care | Payer: PPO | Source: Ambulatory Visit | Attending: Internal Medicine | Admitting: Internal Medicine

## 2020-12-22 DIAGNOSIS — R42 Dizziness and giddiness: Secondary | ICD-10-CM

## 2020-12-27 ENCOUNTER — Other Ambulatory Visit: Payer: PPO

## 2020-12-28 ENCOUNTER — Telehealth: Payer: Self-pay | Admitting: Internal Medicine

## 2020-12-28 DIAGNOSIS — Z8673 Personal history of transient ischemic attack (TIA), and cerebral infarction without residual deficits: Secondary | ICD-10-CM

## 2020-12-28 NOTE — Addendum Note (Signed)
Addended by: Binnie Rail on: 12/28/2020 02:45 PM   Modules accepted: Orders

## 2020-12-28 NOTE — Telephone Encounter (Signed)
Your MRI of your brain shows no acute changes.  You do have evidence of a previous stroke, which may have happened and you never knew it.  That is common.  You should schedule a follow-up with Dr. Alain Marion to discuss this in more detail.  Hopefully her dizziness is better at this point.  Written by Binnie Rail, MD on 12/22/2020  4:41 PM EDT Seen by patient [name] on 12/27/2020  6:30 PM

## 2020-12-28 NOTE — Telephone Encounter (Signed)
I ordered a referral for neurology - yes she can come see me to discuss

## 2020-12-28 NOTE — Telephone Encounter (Signed)
Spoke with patient. Appointment made.

## 2020-12-28 NOTE — Telephone Encounter (Signed)
Patient called in to discuss previous MRI results  Mri was ordered by Dr. Quay Burow  Patient wants to know if she should discuss results w/ her or if she should wait & discuss w/ pcp Plotnikov  Please advised patient

## 2021-01-01 ENCOUNTER — Ambulatory Visit: Payer: PPO | Admitting: Internal Medicine

## 2021-01-01 DIAGNOSIS — Z8673 Personal history of transient ischemic attack (TIA), and cerebral infarction without residual deficits: Secondary | ICD-10-CM | POA: Insufficient documentation

## 2021-01-01 NOTE — Patient Instructions (Addendum)
   A carotid ultrasound was ordered.  Someone will call you to schedule this.     Medications changes include :   hold the red yeast rice.  Start crestor 5 mg daily  Your prescription(s) have been submitted to your pharmacy. Please take as directed and contact our office if you believe you are having problem(s) with the medication(s).   Have blood work done in about 6 weeks.    Call Dr [name].

## 2021-01-01 NOTE — Progress Notes (Signed)
Subjective:    Patient ID: Diana Stout, female    DOB: 1930-11-12, 87 y.o.   MRN: [id number]  This visit occurred during the SARS-CoV-2 public health emergency.  Safety protocols were in place, including screening questions prior to the visit, additional usage of staff PPE, and extensive cleaning of exam room while observing appropriate contact time as indicated for disinfecting solutions.     HPI The patient is here for follow up of recent MRI that showed a previous cva.  MRI was done for dizziness.  The dizziness is gone.    A previous stroke was seen on the MRI and she is here to discuss that.  She denies any symptoms suggestive of having had a stroke and was surprised by this finding.  She does state that her balance is not as good, but relates that to COVID and not being as active-she used to do a lot more walking and is not currently exercising.  She knows she needs to get back into the regular exercise.  Sees Dr [name] 1/23 Sees Dr Krista Blue 12/022    Medications and allergies reviewed with patient and updated if appropriate.  Patient Active Problem List   Diagnosis Date Noted   Balance problem 11/29/2020   Acute cystitis with hematuria 01/31/2020   Recurrent UTI 01/31/2020   Low back pain 10/13/2019   CAD (coronary artery disease) 03/04/2018   Lightheadedness 11/05/2017   Canker sore 01/01/2017   Kidney cyst, acquired 07/22/2016   Rash and nonspecific skin eruption 09/12/2015   Obesity 01/03/2015   Upper airway cough syndrome 12/08/2014   Right-sided thoracic back pain 05/12/2014   Palpitations 05/12/2014   Snoring 12/22/2013   Diarrhea 07/19/2013   Dizziness 07/19/2013   Fatigue 10/15/2011   Impaired glucose tolerance 11/22/2010   Well adult exam 11/22/2010   Abdominal pain, lower 11/22/2010   SYNCOPE 09/27/2009   Edema 09/27/2009   Recurrent pneumonia 08/15/2009   ANEMIA, NORMOCYTIC 08/10/2009   SKIN RASH 08/10/2009   FOOT PAIN 07/25/2009   Dyspnea  07/01/2008   Bronchitis 06/12/2007   Essential hypertension 03/08/2007   DIVERTICULOSIS, COLON 03/08/2007   ESOPHAGEAL STRICTURE 03/06/2007   IBS 03/06/2007   SHINGLES, HX OF 03/06/2007   Dyslipidemia 02/02/2007   GERD 02/02/2007   Osteoporosis 02/02/2007   DVT, HX OF 02/02/2007    Current Outpatient Medications on File Prior to Visit  Medication Sig Dispense Refill   Apple Cider Vinegar (APPLE CIDER VINEGAR ULTRA) 600 MG CAPS Take by mouth.     aspirin 81 MG tablet Take 81 mg by mouth daily.     cholecalciferol (VITAMIN D) 1000 UNITS tablet Take 1,000 Units by mouth daily.     cyanocobalamin 1000 MCG tablet Take 1,000 mcg by mouth daily.     fluticasone (FLONASE) 50 MCG/ACT nasal spray USE 2 SPRAYS IN EACH NOSTRIL AS NEEDED FOR RHINITIS 48 g 3   furosemide (LASIX) 20 MG tablet 1 po qd prn 90 tablet 1   KLOR-CON M10 10 MEQ tablet Take 1 tablet (10 mEq total) by mouth daily. 90 tablet 3   Magnesium 400 MG CAPS Take by mouth daily.     meclizine (ANTIVERT) 12.5 MG tablet Take 1 tablet (12.5 mg total) by mouth 3 (three) times daily as needed for dizziness. 30 tablet 0   nitroGLYCERIN (NITROSTAT) 0.4 MG SL tablet Place 1 tablet (0.4 mg total) under the tongue every 5 (five) minutes as needed for chest pain. 25 tablet 3  Probiotic Product (ALIGN) 4 MG CAPS Take 1 capsule (4 mg total) by mouth daily. 30 capsule 0   Red Yeast Rice Extract (RED YEAST RICE PO) Take 2 each by mouth daily.     Respiratory Therapy Supplies (FLUTTER) DEVI Use as directed 1 each 0   No current facility-administered medications on file prior to visit.    Past Medical History:  Diagnosis Date   Abdominal pain, lower 11/22/2010   2015 resolved off Zocor 5/17?urticaria w/GI upset: Dtr is allergic to seafood - the pt is on MegaRed krill oil - d/c MegaRed    Abnormal chest CT 05/22/2011   Followed in Pulmonary clinic/ [facility]/ [facility]  05/14/11 CT CHEST WITHOUT CONTRAST 1. Multiple small pulmonary nodules  scattered throughout the lungs  bilaterally ranging in size from 3-6 mm.    2. There is a small hiatal hernia.  3. Atherosclerosis.  4. Status post cholecystectomy.  5. Low attenuation hepatic lesions unchanged compared to recent  abdominal CT scan, likely to represent cysts   ANEMIA, NORMOCYTIC 08/10/2009   mild     DIVERTICULOSIS, COLON 03/08/2007   Qualifier: Diagnosis of  By: [name] MD, [name]    Dizziness 07/19/2013   Likely due to diarrhea/meds 4/15    DVT (deep venous thrombosis) ([place]) 1998   hx of right   Dyslipidemia 02/02/2007   Chronic 1/13 worse     Dyspnea 07/01/2008   2/13 improved w/reg exercise 1/17 deconditioning - CL stress test was nl     Edema 09/27/2009   Qualifier: Diagnosis of  By: [name], MD, [name]    ESOPHAGEAL STRICTURE 03/06/2007   Qualifier: Diagnosis of  By: [name] MD, [name]    Esophagitis 2006   Essential hypertension 03/08/2007   Chronic  On Benicar    FOOT PAIN 07/25/2009   R foot OA     GERD 02/02/2007   Wedge pillow Protonix prn, Pepcid - d/c   History of pneumonia 12/2016   History of shingles    Hyperlipidemia    IBS 03/06/2007   Qualifier: Diagnosis of  By: [name] MD, [name]    Impaired glucose tolerance 11/22/2010   Kidney cysts    left- Dr [name]   Lung nodule 05/08/2011   04/15/11 RLL 4 mm nodule on abd CT - [facility] office    Obesity 01/03/2015   Osteoporosis 02/02/2007   Chronic  Vit D, yoga   Palpitations 05/12/2014   Dr [name] 2/16 poss due to steroid pack    Rash and nonspecific skin eruption 09/12/2015   5/17?urticaria Pt is allergic to seafood - the pt is on MegaRed krill oil - d/c MegaRed    Upper airway cough syndrome 12/08/2014   Dr [name] Flutter valve added 01/02/2015 > resolved 02/03/2015  - rec try off gerd rx p 03/09/15     Past Surgical History:  Procedure Laterality Date   APPENDECTOMY     CHOLECYSTECTOMY     HYSTEROSCOPY WITH RESECTOSCOPE  7/99   resect polyp   MOHS SURGERY  05/2018   face   TUBAL LIGATION  Bilateral     Social History   Socioeconomic History   Marital status: Widowed    Spouse name: Not on file   Number of children: 3   Years of education: Not on file   Highest education level: Not on file  Occupational History   Occupation: Retired    Fish farm manager: RETIRED  Tobacco Use   Smoking status: Never  Smokeless tobacco: Never  Vaping Use   Vaping Use: Never used  Substance and Sexual Activity   Alcohol use: No   Drug use: No   Sexual activity: Not Currently  Other Topics Concern   Not on file  Social History Narrative   Not on file   Social Determinants of Health   Financial Resource Strain: Not on file  Food Insecurity: Not on file  Transportation Needs: Not on file  Physical Activity: Not on file  Stress: Not on file  Social Connections: Not on file    Family History  Problem Relation Age of Onset   Asthma Mother    Diabetes Father    Cancer Father        liver   Cancer Sister        breast   Diabetes Sister    Asthma Sister    Breast cancer Sister    Diabetes Brother    Asthma Brother    Asthma Sister    Diabetes Brother    Coronary artery disease Other        1 st degree female relative   Thyroid disease Maternal Uncle     Review of Systems  Constitutional:  Negative for fever.  Eyes:  Negative for visual disturbance.  Respiratory:  Positive for shortness of breath. Negative for cough and wheezing.   Cardiovascular:  Positive for palpitations (rare - not for a while) and leg swelling (lasix 3 times a week). Negative for chest pain.  Neurological:  Negative for dizziness, weakness (no focal weakness), numbness (mild tingling in legs) and headaches.      Objective:  There were no vitals filed for this visit. BP Readings from Last 3 Encounters:  12/19/20 124/78  11/29/20 120/78  08/29/20 (!) 127/59   Wt Readings from Last 3 Encounters:  12/19/20 165 lb (74.8 kg)  11/29/20 161 lb 12.8 oz (73.4 kg)  08/29/20 165 lb (74.8 kg)   There is  no height or weight on file to calculate BMI.   Physical Exam    Constitutional: Appears well-developed and well-nourished. No distress.  HENT:  Head: Normocephalic and atraumatic.  Neck: Neck supple. No tracheal deviation present. No thyromegaly present.  No cervical lymphadenopathy Cardiovascular: Normal rate, regular rhythm and normal heart sounds.   No murmur heard. No carotid bruit .  Trace bl LE edema Pulmonary/Chest: Effort normal and breath sounds normal. No respiratory distress. No has no wheezes. No rales.  Neurology:  CN 2-12 normal, normal strength and sensation b/l UE and LE Skin: Skin is warm and dry. Not diaphoretic.    MR Brain Wo Contrast CLINICAL DATA:  Vertigo.  Dizziness, nonspecific.  EXAM: MRI HEAD WITHOUT CONTRAST  TECHNIQUE: Multiplanar, multiecho pulse sequences of the brain and surrounding structures were obtained without intravenous contrast.  COMPARISON:  No pertinent prior exams available for comparison.  FINDINGS: Brain:  Mild generalized cerebral and cerebellar atrophy.  Chronic lacunar infarct within the left corona radiata/lentiform nucleus.  Background mild multifocal T2/FLAIR hyperintensity within the cerebral white matter, nonspecific but compatible with chronic small vessel ischemic disease.  Small focus of susceptibility within the left basal ganglia, which may reflect mineralization or a chronic microhemorrhage.  There is no acute infarct.  No evidence of an intracranial mass.  No extra-axial fluid collection.  No midline shift.  Partially empty sella turcica.  Vascular: Maintained flow voids within the proximal large arterial vessels.  Skull and upper cervical spine: No focal suspicious marrow lesion  Sinuses/Orbits:  Visualized orbits show no acute finding. Bilateral lens replacements. Trace mucosal thickening within the bilateral ethmoid and right maxillary sinuses.  IMPRESSION: No evidence of acute intracranial  abnormality.  Chronic lacunar infarct within the left corona radiata/basal ganglia.  Background mild chronic small vessel ischemic changes within the cerebral white matter.  Mild generalized parenchymal atrophy.  Electronically Signed   By: Kellie Simmering D.O.   On: 12/22/2020 15:09    Assessment & Plan:    History of CVA: Acute Seen on recent MRI She was not aware of this and discussed that she may not have had any symptoms or may have had very subtle symptoms Will get carotid ultrasounds I have already referred her to neurology and she has an appointment She does see cardiology and discussed that she should discuss with him if she should have further testing for possible arrhythmia-she has had that in the past Continue aspirin 81 mg daily Blood pressure is well controlled Will start Crestor 5 mg daily Discussed the importance of a healthy lifestyle  Dyslipidemia: Chronic Has not tolerated atorvastatin or simvastatin due to GI side effects Currently taking red yeast rice Discontinue red yeast rice She agrees to try rosuvastatin 5 mg daily Recheck lipids, CMP in 6 weeks  Hypertension: Chronic Well-controlled Not on any daily medication Continue Lasix 20 mg daily as needed with potassium  Prediabetes: Sugars elevated in the past Will check A1c in 6 weeks with lipids

## 2021-01-02 ENCOUNTER — Ambulatory Visit (INDEPENDENT_AMBULATORY_CARE_PROVIDER_SITE_OTHER): Payer: PPO | Admitting: Internal Medicine

## 2021-01-02 ENCOUNTER — Encounter: Payer: Self-pay | Admitting: Internal Medicine

## 2021-01-02 ENCOUNTER — Other Ambulatory Visit: Payer: Self-pay

## 2021-01-02 VITALS — BP 120/80 | HR 69 | Temp 98.0°F | Ht <= 58 in | Wt 164.0 lb

## 2021-01-02 DIAGNOSIS — R7303 Prediabetes: Secondary | ICD-10-CM

## 2021-01-02 DIAGNOSIS — E785 Hyperlipidemia, unspecified: Secondary | ICD-10-CM | POA: Diagnosis not present

## 2021-01-02 DIAGNOSIS — Z8673 Personal history of transient ischemic attack (TIA), and cerebral infarction without residual deficits: Secondary | ICD-10-CM

## 2021-01-02 MED ORDER — ROSUVASTATIN CALCIUM 5 MG PO TABS
5.0000 mg | ORAL_TABLET | Freq: Every day | ORAL | 5 refills | Status: DC
Start: 2021-01-02 — End: 2021-06-06

## 2021-01-03 ENCOUNTER — Ambulatory Visit (HOSPITAL_COMMUNITY)
Admission: RE | Admit: 2021-01-03 | Discharge: 2021-01-03 | Disposition: A | Discharge status: Home / Self Care | Payer: PPO | Source: Ambulatory Visit | Attending: Cardiology | Admitting: Cardiology

## 2021-01-03 ENCOUNTER — Encounter: Payer: Self-pay | Admitting: Cardiovascular Disease

## 2021-01-03 ENCOUNTER — Ambulatory Visit: Payer: PPO | Admitting: Cardiovascular Disease

## 2021-01-03 VITALS — BP 136/82 | HR 91 | Ht 59.0 in | Wt 162.0 lb

## 2021-01-03 DIAGNOSIS — J479 Bronchiectasis, uncomplicated: Secondary | ICD-10-CM

## 2021-01-03 DIAGNOSIS — R002 Palpitations: Secondary | ICD-10-CM

## 2021-01-03 DIAGNOSIS — Z8673 Personal history of transient ischemic attack (TIA), and cerebral infarction without residual deficits: Secondary | ICD-10-CM | POA: Diagnosis not present

## 2021-01-03 DIAGNOSIS — I6381 Other cerebral infarction due to occlusion or stenosis of small artery: Secondary | ICD-10-CM

## 2021-01-03 DIAGNOSIS — I10 Essential (primary) hypertension: Secondary | ICD-10-CM

## 2021-01-03 NOTE — Progress Notes (Signed)
CARDIOLOGY CONSULT NOTE      Patient ID: Diana Stout MRN: [id number] DOB/AGE: Oct 20, 1930 87 y.o.  Primary Physician: [name], MD Primary Cardiologist: [name]   HPI:  87 y.o. referred by Dr [name] 01/06/19  for chest pain and palpitations. 9/20 had pounding heart with chest tightness for 2.5 hours. BP was elevated some dizziness. Resolved and felt fine. Had been off BP meds for a year Toprol restarted on 12/29/18 by primary ECG reviewed from 9/22 normal sinus normal ST/T waves rate 86 bpm  Previous w/u with normal myovue March 2017 EF 68% Has had normal echo's in 2010 and 2016 Some bronchiectasis  Intolerant to lipitor and Zocor in past  She has not had recurrence of palpitations or chest pain She has gotten out of shape during COVID Some exertional dyspnea   Monitor reviewed from 01/28/19  Average HR 80 PAC;s short bursts <8 beats atrial tachycardia  Daughter who had ENT cancer doing well and has masters in deaf education   Seen by primary this week MRI with lacunar/pontine stroke ? Related to balance issues Was wondering if she needed another monitor   ROS All other systems reviewed and negative except as noted above  Past Medical History:  Diagnosis Date   Abdominal pain, lower 11/22/2010   2015 resolved off Zocor 5/17?urticaria w/GI upset: Dtr is allergic to seafood - the pt is on MegaRed krill oil - d/c MegaRed    Abnormal chest CT 05/22/2011   Followed in Pulmonary clinic/ [facility]/ [facility]  05/14/11 CT CHEST WITHOUT CONTRAST 1. Multiple small pulmonary nodules scattered throughout the lungs  bilaterally ranging in size from 3-6 mm.    2. There is a small hiatal hernia.  3. Atherosclerosis.  4. Status post cholecystectomy.  5. Low attenuation hepatic lesions unchanged compared to recent  abdominal CT scan, likely to represent cysts   ANEMIA, NORMOCYTIC 08/10/2009   mild     DIVERTICULOSIS, COLON 03/08/2007   Qualifier: Diagnosis of  By: [name] MD, [name]     Dizziness 07/19/2013   Likely due to diarrhea/meds 4/15    DVT (deep venous thrombosis) ([place]) 1998   hx of right   Dyslipidemia 02/02/2007   Chronic 1/13 worse     Dyspnea 07/01/2008   2/13 improved w/reg exercise 1/17 deconditioning - CL stress test was nl     Edema 09/27/2009   Qualifier: Diagnosis of  By: [name], MD, [name]    ESOPHAGEAL STRICTURE 03/06/2007   Qualifier: Diagnosis of  By: [name] MD, [name]    Esophagitis 2006   Essential hypertension 03/08/2007   Chronic  On Benicar    FOOT PAIN 07/25/2009   R foot OA     GERD 02/02/2007   Wedge pillow Protonix prn, Pepcid - d/c   History of pneumonia 12/2016   History of shingles    Hyperlipidemia    IBS 03/06/2007   Qualifier: Diagnosis of  By: [name] MD, [name]    Impaired glucose tolerance 11/22/2010   Kidney cysts    left- Dr [name]   Lung nodule 05/08/2011   04/15/11 RLL 4 mm nodule on abd CT - [facility] office    Obesity 01/03/2015   Osteoporosis 02/02/2007   Chronic  Vit D, yoga   Palpitations 05/12/2014   Dr [name] 2/16 poss due to steroid pack    Rash and nonspecific skin eruption 09/12/2015   5/17?urticaria Pt is allergic to seafood - the pt is on MegaRed krill  oil - d/c MegaRed    Upper airway cough syndrome 12/08/2014   Dr [name] Flutter valve added 01/02/2015 > resolved 02/03/2015  - rec try off gerd rx p 03/09/15     Family History  Problem Relation Age of Onset   Asthma Mother    Diabetes Father    Cancer Father        liver   Cancer Sister        breast   Diabetes Sister    Asthma Sister    Breast cancer Sister    Diabetes Brother    Asthma Brother    Asthma Sister    Diabetes Brother    Coronary artery disease Other        1 st degree female relative   Thyroid disease Maternal Uncle     Social History   Socioeconomic History   Marital status: Widowed    Spouse name: Not on file   Number of children: 3   Years of education: Not on file   Highest education level: Not on file  Occupational  History   Occupation: Retired    Fish farm manager: RETIRED  Tobacco Use   Smoking status: Never   Smokeless tobacco: Never  Vaping Use   Vaping Use: Never used  Substance and Sexual Activity   Alcohol use: No   Drug use: No   Sexual activity: Not Currently  Other Topics Concern   Not on file  Social History Narrative   Not on file   Social Determinants of Health   Financial Resource Strain: Not on file  Food Insecurity: Not on file  Transportation Needs: Not on file  Physical Activity: Not on file  Stress: Not on file  Social Connections: Not on file  Intimate Partner Violence: Not on file    Past Surgical History:  Procedure Laterality Date   APPENDECTOMY     CHOLECYSTECTOMY     HYSTEROSCOPY WITH RESECTOSCOPE  7/99   resect polyp   MOHS SURGERY  05/2018   face   TUBAL LIGATION Bilateral         Physical Exam: Blood pressure 136/82, pulse 91, height 4\' 11"  (1.499 m), weight 73.5 kg, last menstrual period 04/08/1996, SpO2 94 %.   Affect appropriate Healthy:  appears stated age 24: normal Neck supple with no adenopathy JVP normal no bruits no thyromegaly Lungs clear with no wheezing and good diaphragmatic motion Heart:  S1/S2 no murmur, no rub, gallop or click PMI normal Abdomen: benighn, BS positve, no tenderness, no AAA no bruit.  No HSM or HJR Distal pulses intact with no bruits No edema Neuro non-focal Skin warm and dry No muscular weakness   Labs:   Lab Results  Component Value Date   WBC 4.9 11/29/2020   HGB 12.4 11/29/2020   HCT 38.2 11/29/2020   MCV 86.0 11/29/2020   PLT 200.0 11/29/2020    No results for input(s): NA, K, CL, CO2, BUN, CREATININE, CALCIUM, PROT, BILITOT, ALKPHOS, ALT, AST, GLUCOSE in the last 168 hours.  Invalid input(s): LABALBU Lab Results  Component Value Date   KGURKYH 062 (H) 08/02/2009   CKMB 1.5 06/22/2014   TROPONINI 0.01        NO INDICATION OF MYOCARDIAL INJURY. 08/02/2009    Lab Results  Component Value  Date   CHOL 206 (H) 11/29/2020   CHOL 205 (H) 08/26/2019   CHOL 185 09/22/2018   Lab Results  Component Value Date   HDL 47.00 11/29/2020   HDL 44.90  08/26/2019   HDL 41.10 09/22/2018   Lab Results  Component Value Date   LDLCALC 139 (H) 11/29/2020   LDLCALC 139 (H) 08/26/2019   LDLCALC 120 (H) 09/22/2018   Lab Results  Component Value Date   TRIG 102.0 11/29/2020   TRIG 107.0 08/26/2019   TRIG 118.0 09/22/2018   Lab Results  Component Value Date   CHOLHDL 4 11/29/2020   CHOLHDL 5 08/26/2019   CHOLHDL 4 09/22/2018   Lab Results  Component Value Date   LDLDIRECT 154.9 05/01/2011      Radiology: MR Brain Wo Contrast  Result Date: 12/22/2020 CLINICAL DATA:  Vertigo.  Dizziness, nonspecific. EXAM: MRI HEAD WITHOUT CONTRAST TECHNIQUE: Multiplanar, multiecho pulse sequences of the brain and surrounding structures were obtained without intravenous contrast. COMPARISON:  No pertinent prior exams available for comparison. FINDINGS: Brain: Mild generalized cerebral and cerebellar atrophy. Chronic lacunar infarct within the left corona radiata/lentiform nucleus. Background mild multifocal T2/FLAIR hyperintensity within the cerebral white matter, nonspecific but compatible with chronic small vessel ischemic disease. Small focus of susceptibility within the left basal ganglia, which may reflect mineralization or a chronic microhemorrhage. There is no acute infarct. No evidence of an intracranial mass. No extra-axial fluid collection. No midline shift. Partially empty sella turcica. Vascular: Maintained flow voids within the proximal large arterial vessels. Skull and upper cervical spine: No focal suspicious marrow lesion Sinuses/Orbits: Visualized orbits show no acute finding. Bilateral lens replacements. Trace mucosal thickening within the bilateral ethmoid and right maxillary sinuses. IMPRESSION: No evidence of acute intracranial abnormality. Chronic lacunar infarct within the left corona  radiata/basal ganglia. Background mild chronic small vessel ischemic changes within the cerebral white matter. Mild generalized parenchymal atrophy. Electronically Signed   By: Kellie Simmering D.O.   On: 12/22/2020 15:09    EKG: 12/29/18 NSR normal ECG  01/04/20 SR rate 80 LAD low voltage 01/03/2021 SR rate 91 normal    ASSESSMENT AND PLAN:   1.  Palpitations/Silent Stroke:  Likely self limited episode of PSVT Continue Toprol Monitor reviewed and no SVT/PAF or serious arrhythmia Recent diagnosis of silent stroke Although pattern on MRI seems lacunar / ponttine and not embolic will refer to EP to consider loop recorder ECG to day NSR normal   2. Chest pain:  In setting of palpitations Normal ECG and previously normal myovue x 3 most recent March 2017 no indication for stress testing currently   3. HTN:  Per primary off BP meds 10/13/19 BP office visit 112/68 mmHg 10/13/19  4. Pulmonary:  Bronchiectasis no recent pneumonia Flu shot f/u pulmonary   F/U in a year new nitro called in  Refer to EP ? Loop recorder   Time:  Spent reviewing chart direct patient tele visit and composing note 20 minutes    Signed: Jenkins Rouge 01/03/2021, 8:09 AM

## 2021-01-03 NOTE — Patient Instructions (Signed)
Medication Instructions:  No changes *If you need a refill on your cardiac medications before your next appointment, please call your pharmacy*   Lab Work: none If you have labs (blood work) drawn today and your tests are completely normal, you will receive your results only by: [facility] (if you have MyChart) OR A paper copy in the mail If you have any lab test that is abnormal or we need to change your treatment, we will call you to review the results.   Testing/Procedures: none   Follow-Up: At [facility], you and your health needs are our priority.  As part of our continuing mission to provide you with exceptional heart care, we have created designated Provider Care Teams.  These Care Teams include your primary Cardiologist (physician) and Advanced Practice Providers (APPs -  Physician Assistants and Nurse Practitioners) who all work together to provide you with the care you need, when you need it.   Your next appointment:   12 month(s)  The format for your next appointment:   In Person  Provider:   You may see Jenkins Rouge, MD or one of the following Advanced Practice Providers on your designated Care Team:   Cecilie Kicks, NP    Other Instructions  Referral to Electrophysiology for consideration of loop recorder.

## 2021-01-04 ENCOUNTER — Telehealth: Payer: Self-pay | Admitting: Internal Medicine

## 2021-01-04 NOTE — Telephone Encounter (Signed)
Patient calling in to go over results from recent US  Please call patient back

## 2021-01-04 NOTE — Telephone Encounter (Signed)
Results given to patient on yesterday.  She saw Dr. [name] on yesterday and loop ordered for November.

## 2021-01-19 DIAGNOSIS — N390 Urinary tract infection, site not specified: Secondary | ICD-10-CM | POA: Diagnosis not present

## 2021-01-19 DIAGNOSIS — N3941 Urge incontinence: Secondary | ICD-10-CM | POA: Diagnosis not present

## 2021-01-30 ENCOUNTER — Telehealth: Payer: Self-pay | Admitting: Pulmonary Disease

## 2021-01-30 MED ORDER — AZITHROMYCIN 250 MG PO TABS: ORAL_TABLET | ORAL | 0 refills | Status: AC

## 2021-01-30 NOTE — Telephone Encounter (Signed)
Spoke to patient. She feels that she has URI.  C/o deep prod cough with clear sputum and wheezing. Sx developed last wednesday.   Sob is baseline Denied f/c/s or additional sx. She is taking 1200mg  of mucinex BID with some relief in sx.  Negative home covid test Sunday.  She is fully vaccinated against covid.  No supplemental oxygen. She does not monitor spo2.   Dr. Elsworth Soho, please advise. Thanks

## 2021-01-30 NOTE — Telephone Encounter (Signed)
Patient is aware of recommendations and voiced her understanding.  Zpak has been sent to preferred pharmacy. Nothing further needed.

## 2021-02-05 ENCOUNTER — Ambulatory Visit: Payer: PPO | Admitting: Adult Health

## 2021-02-05 ENCOUNTER — Telehealth: Payer: Self-pay | Admitting: Pulmonary Disease

## 2021-02-05 NOTE — Telephone Encounter (Signed)
I called and spoke with the patient and she has an OV with a provider on 02/06/21. She is aware to seek emergency care if her symptoms become worse. Nothing further needed.

## 2021-02-05 NOTE — Telephone Encounter (Signed)
Patient is returning phone call. Patient phone number is [phone].

## 2021-02-05 NOTE — Telephone Encounter (Signed)
ATC--patient answered but there seemed to be a connection issue. I could hear patient but she could not hear me.  Called back x2 and received busy dial.

## 2021-02-06 ENCOUNTER — Other Ambulatory Visit: Payer: Self-pay

## 2021-02-06 ENCOUNTER — Ambulatory Visit (INDEPENDENT_AMBULATORY_CARE_PROVIDER_SITE_OTHER): Payer: PPO

## 2021-02-06 ENCOUNTER — Encounter: Payer: Self-pay | Admitting: Adult Health

## 2021-02-06 ENCOUNTER — Ambulatory Visit: Payer: PPO | Admitting: Adult Health

## 2021-02-06 VITALS — BP 132/88 | HR 83 | Temp 98.2°F | Ht 59.0 in | Wt 167.4 lb

## 2021-02-06 DIAGNOSIS — J40 Bronchitis, not specified as acute or chronic: Secondary | ICD-10-CM

## 2021-02-06 DIAGNOSIS — J45909 Unspecified asthma, uncomplicated: Secondary | ICD-10-CM | POA: Diagnosis not present

## 2021-02-06 MED ORDER — PREDNISONE 20 MG PO TABS: 20.0000 mg | ORAL_TABLET | Freq: Every day | ORAL | 0 refills | Status: DC

## 2021-02-06 MED ORDER — ALBUTEROL SULFATE HFA 108 (90 BASE) MCG/ACT IN AERS: 1.0000 | INHALATION_SPRAY | Freq: Four times a day (QID) | RESPIRATORY_TRACT | 2 refills | Status: DC | PRN

## 2021-02-06 NOTE — Patient Instructions (Addendum)
Chest xray today  Prednisone 20mg  daily for 5 days. Take with food in am  Albuterol inhaler 1 puff every 6hr as needed for shortness of breath /wheezing .  Mucinex DM twice daily as needed for cough and congestion Sips of water to soothe throat to avoid throat clearing and cough .  Prilosec 20mg  daily for 2 weeks and then As needed   Claritin 10 mg daily for 2 weeks and then as needed for drainage Fluids and rest Saline nasal rinses as needed Follow-up with Dr. Elsworth Soho as planned and As needed   Please contact office for sooner follow up if symptoms do not improve or worsen or seek emergency care

## 2021-02-06 NOTE — Progress Notes (Signed)
@Patient  ID: Diana Stout, female    DOB: Jul 31, 1930, 87 y.o.   MRN: [id number]  Chief Complaint  Patient presents with   Follow-up    Referring provider: [name], MD  HPI: 87 year old female followed for chronic cough and recurrent pneumonia   TEST/EVENTS :  HRCT 01/2017 no bronchiectasis, mild air trapping CT chest 04/2012 stable pulmonary nodules compared to 05/2011, small  hiatal hernia   Spirometry 01/2017 - no evidence of airway obstruction   CT chest images from 2013 and 2014 >> mild bronchiectasis  02/06/2021 Follow up : Bronchitis  Patient complains over the last week that she has had increased cough, congestion, wheezing and shortness of breath.  She was called in a Z-Pak.  Patient says that she is only slightly better.  Cough has improved some but she continues to have some intermittent wheezing.  And shortness of breath.  She denies any fever, chest pain, orthopnea or edema.  Appetite is good. She has been using Mucinex DM and her flutter valve.  She does have quite a bit of nasal drainage and postnasal drip. Worst part of this is she has a severe coughing paroxysms that keep her awake at times.     Allergies  Allergen Reactions   Atorvastatin Nausea And Vomiting    REACTION: Gi pain   Alendronate Sodium Other (See Comments)    REACTION: esophagus problem   Ceftriaxone Sodium Rash    REACTION: rash   Codeine Phosphate Other (See Comments)    REACTION: nightmares   Moxifloxacin Palpitations    REACTION: nausea/chest pain She can take Cipro   Simvastatin Other (See Comments)    indigestion    Immunization History  Administered Date(s) Administered   Fluad Quad(high Dose 65+) 01/08/2019, 01/07/2020   Influenza Split 12/25/2011   Influenza Whole 02/17/2007, 01/17/2010, 01/07/2011, 01/07/2016   Influenza, High Dose Seasonal PF 01/10/2017, 12/25/2017, 01/15/2021   Influenza,inj,Quad PF,6+ Mos 02/03/2015   Influenza-Unspecified 02/04/2013,  01/06/2014, 03/23/2014   Moderna SARS-COV2 Booster Vaccination 12/08/2019   Moderna Sars-Covid-2 Vaccination 04/19/2019, 05/18/2019   Pneumococcal Conjugate-13 03/09/2013   Pneumococcal Polysaccharide-23 02/18/2005, 07/22/2016   Td 06/20/2003, 12/22/2013   Tdap 08/25/2015   Zoster, Live 02/02/2007    Past Medical History:  Diagnosis Date   Abdominal pain, lower 11/22/2010   2015 resolved off Zocor 5/17?urticaria w/GI upset: Dtr is allergic to seafood - the pt is on MegaRed krill oil - d/c MegaRed    Abnormal chest CT 05/22/2011   Followed in Pulmonary clinic/ [facility]/ [place]  05/14/11 CT CHEST WITHOUT CONTRAST 1. Multiple small pulmonary nodules scattered throughout the lungs  bilaterally ranging in size from 3-6 mm.    2. There is a small hiatal hernia.  3. Atherosclerosis.  4. Status post cholecystectomy.  5. Low attenuation hepatic lesions unchanged compared to recent  abdominal CT scan, likely to represent cysts   ANEMIA, NORMOCYTIC 08/10/2009   mild     DIVERTICULOSIS, COLON 03/08/2007   Qualifier: Diagnosis of  By: [name] MD, [name]    Dizziness 07/19/2013   Likely due to diarrhea/meds 4/15    DVT (deep venous thrombosis) ([place]) 1998   hx of right   Dyslipidemia 02/02/2007   Chronic 1/13 worse     Dyspnea 07/01/2008   2/13 improved w/reg exercise 1/17 deconditioning - CL stress test was nl     Edema 09/27/2009   Qualifier: Diagnosis of  By: [name] MD, FACC, [place] STRICTURE 03/06/2007  Qualifier: Diagnosis of  By: [name] MD, [name]    Esophagitis 2006   Essential hypertension 03/08/2007   Chronic  On Benicar    FOOT PAIN 07/25/2009   R foot OA     GERD 02/02/2007   Wedge pillow Protonix prn, Pepcid - d/c   History of pneumonia 12/2016   History of shingles    Hyperlipidemia    IBS 03/06/2007   Qualifier: Diagnosis of  By: [name] MD, [name]    Impaired glucose tolerance 11/22/2010   Kidney cysts    left- Dr [name]   Lung nodule 05/08/2011    04/15/11 RLL 4 mm nodule on abd CT - [facility] office    Obesity 01/03/2015   Osteoporosis 02/02/2007   Chronic  Vit D, yoga   Palpitations 05/12/2014   Dr [name] 2/16 poss due to steroid pack    Rash and nonspecific skin eruption 09/12/2015   5/17?urticaria Pt is allergic to seafood - the pt is on MegaRed krill oil - d/c MegaRed    Upper airway cough syndrome 12/08/2014   Dr [name] Flutter valve added 01/02/2015 > resolved 02/03/2015  - rec try off gerd rx p 03/09/15     Tobacco History: Social History   Tobacco Use  Smoking Status Never  Smokeless Tobacco Never   Counseling given: Not Answered   Outpatient Medications Prior to Visit  Medication Sig Dispense Refill   Apple Cider Vinegar (APPLE CIDER VINEGAR ULTRA) 600 MG CAPS Take by mouth.     aspirin 81 MG tablet Take 81 mg by mouth daily.     cholecalciferol (VITAMIN D) 1000 UNITS tablet Take 1,000 Units by mouth daily.     cyanocobalamin 1000 MCG tablet Take 1,000 mcg by mouth daily.     fluticasone (FLONASE) 50 MCG/ACT nasal spray USE 2 SPRAYS IN EACH NOSTRIL AS NEEDED FOR RHINITIS 48 g 3   furosemide (LASIX) 20 MG tablet 1 po qd prn 90 tablet 1   KLOR-CON M10 10 MEQ tablet Take 1 tablet (10 mEq total) by mouth daily. 90 tablet 3   Magnesium 400 MG CAPS Take by mouth daily.     nitroGLYCERIN (NITROSTAT) 0.4 MG SL tablet Place 1 tablet (0.4 mg total) under the tongue every 5 (five) minutes as needed for chest pain. 25 tablet 3   Probiotic Product (ALIGN) 4 MG CAPS Take 1 capsule (4 mg total) by mouth daily. 30 capsule 0   Respiratory Therapy Supplies (FLUTTER) DEVI Use as directed 1 each 0   rosuvastatin (CRESTOR) 5 MG tablet Take 1 tablet (5 mg total) by mouth daily. 30 tablet 5   meclizine (ANTIVERT) 12.5 MG tablet Take 1 tablet (12.5 mg total) by mouth 3 (three) times daily as needed for dizziness. 30 tablet 0   No facility-administered medications prior to visit.     Review of Systems:   Constitutional:   No  weight loss, night  sweats,  Fevers, chills,  +fatigue, or  lassitude.  HEENT:   No headaches,  Difficulty swallowing,  Tooth/dental problems, or  Sore throat,                No sneezing, itching, ear ache,  +nasal congestion, post nasal drip,   CV:  No chest pain,  Orthopnea, PND, swelling in lower extremities, anasarca, dizziness, palpitations, syncope.   GI  No heartburn, indigestion, abdominal pain, nausea, vomiting, diarrhea, change in bowel habits, loss of appetite, bloody stools.   Resp: .  No chest wall  deformity  Skin: no rash or lesions.  GU: no dysuria, change in color of urine, no urgency or frequency.  No flank pain, no hematuria   MS:  No joint pain or swelling.  No decreased range of motion.  No back pain.    Physical Exam  BP 132/88 (BP Location: Left Arm, Cuff Size: Normal)   Pulse 83   Temp 98.2 F (36.8 C)   Ht 4\' 11"  (1.499 m)   Wt 167 lb 6.4 oz (75.9 kg)   LMP 04/08/1996   SpO2 97%   BMI 33.81 kg/m   GEN: A/Ox3; pleasant , NAD, well nourished    HEENT:  [place]/AT,   NOSE-clear, THROAT-clear, no lesions, no postnasal drip or exudate noted.   NECK:  Supple w/ fair ROM; no JVD; normal carotid impulses w/o bruits; no thyromegaly or nodules palpated; no lymphadenopathy.    RESP few scattered rhonchi no accessory muscle use, no dullness to percussion  CARD:  RRR, no m/r/g, tr peripheral edema, pulses intact, no cyanosis or clubbing.  GI:   Soft & nt; nml bowel sounds; no organomegaly or masses detected.   Musco: Warm bil, no deformities or joint swelling noted.   Neuro: alert, no focal deficits noted.    Skin: Warm, no lesions or rashes    Lab Results:  CBC    Component Value Date/Time   WBC 4.9 11/29/2020 0954   RBC 4.44 11/29/2020 0954   HGB 12.4 11/29/2020 0954   HCT 38.2 11/29/2020 0954   PLT 200.0 11/29/2020 0954   MCV 86.0 11/29/2020 0954   MCH 26.7 09/02/2015 1630   MCHC 32.5 11/29/2020 0954   RDW 15.4 11/29/2020 0954   LYMPHSABS 1.5 11/29/2020 0954    MONOABS 0.5 11/29/2020 0954   EOSABS 0.2 11/29/2020 0954   BASOSABS 0.0 11/29/2020 0954    BMET    Component Value Date/Time   NA 141 11/29/2020 0954   K 4.5 11/29/2020 0954   CL 104 11/29/2020 0954   CO2 29 11/29/2020 0954   GLUCOSE 98 11/29/2020 0954   BUN 17 11/29/2020 0954   CREATININE 0.79 11/29/2020 0954   CREATININE 0.74 09/29/2012 1429   CALCIUM 9.7 11/29/2020 0954   GFRNONAA 60 (L) 09/02/2015 1630   GFRAA >60 09/02/2015 1630    BNP No results found for: BNP  ProBNP No results found for: PROBNP  Imaging: No results found.    No flowsheet data found.  No results found for: NITRICOXIDE      Assessment & Plan:   Acute bronchitis Acute Bronchitis -slow to resolve Check chest x-ray today.  An empiric course of steroids.  Cough control with Mucinex DM and flutter valve .  Add in trigger prevention with Prilosec and Claritin.   Plan  Patient Instructions  Chest xray today  Prednisone 20mg  daily for 5 days. Take with food in am  Albuterol inhaler 1 puff every 6hr as needed for shortness of breath /wheezing .  Mucinex DM twice daily as needed for cough and congestion Sips of water to soothe throat to avoid throat clearing and cough .  Prilosec 20mg  daily for 2 weeks and then As needed   Claritin 10 mg daily for 2 weeks and then as needed for drainage Fluids and rest Saline nasal rinses as needed Follow-up with Dr. Elsworth Soho as planned and As needed   Please contact office for sooner follow up if symptoms do not improve or worsen or seek emergency care  Rexene Edison, NP 02/06/2021

## 2021-02-06 NOTE — Assessment & Plan Note (Signed)
Acute Bronchitis -slow to resolve Check chest x-ray today.  An empiric course of steroids.  Cough control with Mucinex DM and flutter valve .  Add in trigger prevention with Prilosec and Claritin.   Plan  Patient Instructions  Chest xray today  Prednisone 20mg  daily for 5 days. Take with food in am  Albuterol inhaler 1 puff every 6hr as needed for shortness of breath /wheezing .  Mucinex DM twice daily as needed for cough and congestion Sips of water to soothe throat to avoid throat clearing and cough .  Prilosec 20mg  daily for 2 weeks and then As needed   Claritin 10 mg daily for 2 weeks and then as needed for drainage Fluids and rest Saline nasal rinses as needed Follow-up with Dr. Elsworth Soho as planned and As needed   Please contact office for sooner follow up if symptoms do not improve or worsen or seek emergency care

## 2021-02-07 MED ORDER — AMOXICILLIN-POT CLAVULANATE 875-125 MG PO TABS: 1.0000 | ORAL_TABLET | Freq: Two times a day (BID) | ORAL | 0 refills | Status: AC

## 2021-02-07 NOTE — Progress Notes (Unsigned)
Error

## 2021-02-08 ENCOUNTER — Institutional Professional Consult (permissible substitution): Payer: PPO | Admitting: Cardiology

## 2021-02-13 ENCOUNTER — Other Ambulatory Visit: Payer: Self-pay

## 2021-02-13 ENCOUNTER — Ambulatory Visit: Payer: PPO | Admitting: Cardiology

## 2021-02-13 ENCOUNTER — Other Ambulatory Visit: Payer: PPO

## 2021-02-13 ENCOUNTER — Encounter: Payer: Self-pay | Admitting: Cardiology

## 2021-02-13 ENCOUNTER — Ambulatory Visit (INDEPENDENT_AMBULATORY_CARE_PROVIDER_SITE_OTHER): Payer: PPO

## 2021-02-13 ENCOUNTER — Other Ambulatory Visit: Payer: Self-pay | Admitting: Cardiology

## 2021-02-13 VITALS — BP 128/74 | HR 88 | Ht 59.0 in | Wt 168.0 lb

## 2021-02-13 DIAGNOSIS — R002 Palpitations: Secondary | ICD-10-CM | POA: Diagnosis not present

## 2021-02-13 DIAGNOSIS — I10 Essential (primary) hypertension: Secondary | ICD-10-CM

## 2021-02-13 DIAGNOSIS — I6381 Other cerebral infarction due to occlusion or stenosis of small artery: Secondary | ICD-10-CM

## 2021-02-13 NOTE — Progress Notes (Unsigned)
Applied a 14 day Zio XT monitor to patient in the office ?

## 2021-02-13 NOTE — Progress Notes (Signed)
Electrophysiology Office Note:    Date:  02/13/2021   ID:  Diana Stout, DOB 22-Jan-1931, MRN [id number]  PCP:  [name], MD  [facility] Cardiologist:  Jenkins Rouge, MD  [facility] Electrophysiologist:  Vickie Epley, MD   Referring MD: Josue Hector, MD   Chief Complaint: Lacunar infarct  History of Present Illness:    Diana Stout is a 87 y.o. female who presents for an evaluation of lacunar infarct at the request of Dr. [name]. Their medical history includes DVT, and lacunar infarct.  The patient was seen by Dr. [name] January 03, 2021.  That appointment was after an MRI of the brain performed on December 22, 2020 showed prior lacunar infarcts.  No history of atrial fibrillation.  She previously worn a heart monitor for palpitations in 2020 which showed no evidence of atrial fibrillation.  She is referred to me today to discuss need for future monitoring for atrial fibrillation.     Past Medical History:  Diagnosis Date   Abdominal pain, lower 11/22/2010   2015 resolved off Zocor 5/17?urticaria w/GI upset: Dtr is allergic to seafood - the pt is on MegaRed krill oil - d/c MegaRed    Abnormal chest CT 05/22/2011   Followed in Pulmonary clinic/ [facility]/ [place]  05/14/11 CT CHEST WITHOUT CONTRAST 1. Multiple small pulmonary nodules scattered throughout the lungs  bilaterally ranging in size from 3-6 mm.    2. There is a small hiatal hernia.  3. Atherosclerosis.  4. Status post cholecystectomy.  5. Low attenuation hepatic lesions unchanged compared to recent  abdominal CT scan, likely to represent cysts   ANEMIA, NORMOCYTIC 08/10/2009   mild     DIVERTICULOSIS, COLON 03/08/2007   Qualifier: Diagnosis of  By: [name] MD, [name]    Dizziness 07/19/2013   Likely due to diarrhea/meds 4/15    DVT (deep venous thrombosis) ([place]) 1998   hx of right   Dyslipidemia 02/02/2007   Chronic 1/13 worse     Dyspnea 07/01/2008   2/13 improved w/reg exercise 1/17  deconditioning - CL stress test was nl     Edema 09/27/2009   Qualifier: Diagnosis of  By: [name], MD, [name]    ESOPHAGEAL STRICTURE 03/06/2007   Qualifier: Diagnosis of  By: [name] MD, [name]    Esophagitis 2006   Essential hypertension 03/08/2007   Chronic  On Benicar    FOOT PAIN 07/25/2009   R foot OA     GERD 02/02/2007   Wedge pillow Protonix prn, Pepcid - d/c   History of pneumonia 12/2016   History of shingles    Hyperlipidemia    IBS 03/06/2007   Qualifier: Diagnosis of  By: [name] MD, [name]    Impaired glucose tolerance 11/22/2010   Kidney cysts    left- Dr [name]   Lung nodule 05/08/2011   04/15/11 RLL 4 mm nodule on abd CT - [facility] office    Obesity 01/03/2015   Osteoporosis 02/02/2007   Chronic  Vit D, yoga   Palpitations 05/12/2014   Dr [name] 2/16 poss due to steroid pack    Rash and nonspecific skin eruption 09/12/2015   5/17?urticaria Pt is allergic to seafood - the pt is on MegaRed krill oil - d/c MegaRed    Upper airway cough syndrome 12/08/2014   Dr [name] Flutter valve added 01/02/2015 > resolved 02/03/2015  - rec try off gerd rx p 03/09/15     Past Surgical History:  Procedure Laterality Date   APPENDECTOMY     CHOLECYSTECTOMY     HYSTEROSCOPY WITH RESECTOSCOPE  7/99   resect polyp   MOHS SURGERY  05/2018   face   TUBAL LIGATION Bilateral     Current Medications: Current Meds  Medication Sig   albuterol (VENTOLIN HFA) 108 (90 Base) MCG/ACT inhaler Inhale 1 puff into the lungs every 6 (six) hours as needed.   amoxicillin-clavulanate (AUGMENTIN) 875-125 MG tablet Take 1 tablet by mouth 2 (two) times daily for 7 days.   Apple Cider Vinegar (APPLE CIDER VINEGAR ULTRA) 600 MG CAPS Take by mouth.   aspirin 81 MG tablet Take 81 mg by mouth daily.   cholecalciferol (VITAMIN D) 1000 UNITS tablet Take 1,000 Units by mouth daily.   cyanocobalamin 1000 MCG tablet Take 1,000 mcg by mouth daily.   fluticasone (FLONASE) 50 MCG/ACT nasal spray USE 2 SPRAYS IN  EACH NOSTRIL AS NEEDED FOR RHINITIS   furosemide (LASIX) 20 MG tablet 1 po qd prn   KLOR-CON M10 10 MEQ tablet Take 1 tablet (10 mEq total) by mouth daily.   Magnesium 400 MG CAPS Take by mouth daily.   nitroGLYCERIN (NITROSTAT) 0.4 MG SL tablet Place 1 tablet (0.4 mg total) under the tongue every 5 (five) minutes as needed for chest pain.   predniSONE (DELTASONE) 20 MG tablet Take 1 tablet (20 mg total) by mouth daily with breakfast.   Probiotic Product (ALIGN) 4 MG CAPS Take 1 capsule (4 mg total) by mouth daily.   Respiratory Therapy Supplies (FLUTTER) DEVI Use as directed   rosuvastatin (CRESTOR) 5 MG tablet Take 1 tablet (5 mg total) by mouth daily.     Allergies:   Atorvastatin, Alendronate sodium, Ceftriaxone sodium, Codeine phosphate, Moxifloxacin, and Simvastatin   Social History   Socioeconomic History   Marital status: Widowed    Spouse name: Not on file   Number of children: 3   Years of education: Not on file   Highest education level: Not on file  Occupational History   Occupation: Retired    Fish farm manager: RETIRED  Tobacco Use   Smoking status: Never   Smokeless tobacco: Never  Vaping Use   Vaping Use: Never used  Substance and Sexual Activity   Alcohol use: No   Drug use: No   Sexual activity: Not Currently  Other Topics Concern   Not on file  Social History Narrative   Not on file   Social Determinants of Health   Financial Resource Strain: Not on file  Food Insecurity: Not on file  Transportation Needs: Not on file  Physical Activity: Not on file  Stress: Not on file  Social Connections: Not on file     Family History: The patient's family history includes Asthma in her brother, mother, sister, and sister; Breast cancer in her sister; Cancer in her father and sister; Coronary artery disease in an other family member; Diabetes in her brother, brother, father, and sister; Thyroid disease in her maternal uncle.  ROS:   Please see the history of present  illness.    All other systems reviewed and are negative.  EKGs/Labs/Other Studies Reviewed:    The following studies were reviewed today:  December 22, 2020 MRI brain IMPRESSION: No evidence of acute intracranial abnormality.  Chronic lacunar infarct within the left corona radiata/basal ganglia.  Background mild chronic small vessel ischemic changes within the cerebral white matter.  Mild generalized parenchymal atrophy.   February 24, 2019 Holter NSR average HR 80  bpm PAC;s Short runs 4-8 beats atrial arrhythmia No PAF or prolonged SVT     Recent Labs: 11/29/2020: ALT 12; BUN 17; Creatinine, Ser 0.79; Hemoglobin 12.4; Platelets 200.0; Potassium 4.5; Sodium 141; TSH 0.95  Recent Lipid Panel    Component Value Date/Time   CHOL 206 (H) 11/29/2020 0954   TRIG 102.0 11/29/2020 0954   TRIG 145 03/03/2006 1154   HDL 47.00 11/29/2020 0954   CHOLHDL 4 11/29/2020 0954   VLDL 20.4 11/29/2020 0954   LDLCALC 139 (H) 11/29/2020 0954   LDLDIRECT 154.9 05/01/2011 1030    Physical Exam:    VS:  BP 128/74   Pulse 88   Ht 4\' 11"  (1.499 m)   Wt 168 lb (76.2 kg)   LMP 04/08/1996   SpO2 93%   BMI 33.93 kg/m     Wt Readings from Last 3 Encounters:  02/13/21 168 lb (76.2 kg)  02/06/21 167 lb 6.4 oz (75.9 kg)  01/03/21 162 lb (73.5 kg)     GEN:  Well nourished, well developed in no acute distress HEENT: Normal NECK: No JVD; No carotid bruits LYMPHATICS: No lymphadenopathy CARDIAC: RRR, no murmurs, rubs, gallops RESPIRATORY:  Clear to auscultation without rales, wheezing or rhonchi  ABDOMEN: Soft, non-tender, non-distended MUSCULOSKELETAL:  No edema; No deformity  SKIN: Warm and dry NEUROLOGIC:  Alert and oriented x 3 PSYCHIATRIC:  Normal affect       ASSESSMENT:    1. Lacunar stroke ([place])   2. Primary hypertension    PLAN:    In order of problems listed above:  #Lacunar stroke Likely not cardioembolic.  No evidence of atrial fibrillation on prior heart  monitor.  I do not think we should move immediately to loop recorder.  I will plan on a 14-day ZIO monitor first.  I would also like her to touch base with the neurology team.  If they believe there is a high index of suspicion that her lacunar infarct is related to a cardioembolic source then we can proceed with loop recorder implant.  If they think a cardioembolic source is less likely and the ZIO monitor shows no evidence of atrial fibrillation, would plan for continued intermittent follow-up with her primary care physician.   #Hypertension Controlled Continue current regimen    Medication Adjustments/Labs and Tests Ordered: Current medicines are reviewed at length with the patient today.  Concerns regarding medicines are outlined above.  Orders Placed This Encounter  Procedures   LONG TERM MONITOR (3-14 DAYS)   No orders of the defined types were placed in this encounter.    Signed, Hilton Cork. Quentin Ore, MD, [facility], [facility] 02/13/2021 2:15 PM    Electrophysiology [facility]

## 2021-02-13 NOTE — Patient Instructions (Addendum)
Medication Instructions:  Your physician recommends that you continue on your current medications as directed. Please refer to the Current Medication list given to you today. *If you need a refill on your cardiac medications before your next appointment, please call your pharmacy*  Lab Work: None ordered. If you have labs (blood work) drawn today and your tests are completely normal, you will receive your results only by: [place] (if you have MyChart) OR A paper copy in the mail If you have any lab test that is abnormal or we need to change your treatment, we will call you to review the results.  Testing/Procedures: Your physician has recommended that you wear a holter monitor. Holter monitors are medical devices that record the heart's electrical activity. Doctors most often use these monitors to diagnose arrhythmias. Arrhythmias are problems with the speed or rhythm of the heartbeat. The monitor is a small, portable device. You can wear one while you do your normal daily activities. This is usually used to diagnose what is causing palpitations/syncope (passing out).  You will wear a 14 day ZIO monitor  Follow-Up: At [facility], you and your health needs are our priority.  As part of our continuing mission to provide you with exceptional heart care, we have created designated Provider Care Teams.  These Care Teams include your primary Cardiologist (physician) and Advanced Practice Providers (APPs -  Physician Assistants and Nurse Practitioners) who all work together to provide you with the care you need, when you need it.  Your next appointment:   Your physician wants you to follow-up based on results of your heart monitor.  Your physician has recommended that you wear a Zio monitor.   This monitor is a medical device that records the heart's electrical activity. Doctors most often use these monitors to diagnose arrhythmias. Arrhythmias are problems with the speed or rhythm of  the heartbeat. The monitor is a small device applied to your chest. You can wear one while you do your normal daily activities. While wearing this monitor if you have any symptoms to push the button and record what you felt. Once you have worn this monitor for the period of time provider prescribed (Usually 14 days), you will return the monitor device in the postage paid box. Once it is returned they will download the data collected and provide [place] with a report which the provider will then review and we will call you with those results. Important tips:  Avoid showering during the first 24 hours of wearing the monitor. Avoid excessive sweating to help maximize wear time. Do not submerge the device, no hot tubs, and no swimming pools. Keep any lotions or oils away from the patch. After 24 hours you may shower with the patch on. Take brief showers with your back facing the shower head.  Do not remove patch once it has been placed because that will interrupt data and decrease adhesive wear time. Push the button when you have any symptoms and write down what you were feeling. Once you have completed wearing your monitor, remove and place into box which has postage paid and place in your outgoing mailbox.  If for some reason you have misplaced your box then call our office and we can provide another box and/or mail it off for you.

## 2021-02-26 ENCOUNTER — Encounter: Payer: Self-pay | Admitting: Pulmonary Disease

## 2021-02-26 ENCOUNTER — Other Ambulatory Visit: Payer: Self-pay

## 2021-02-26 ENCOUNTER — Ambulatory Visit: Payer: PPO | Admitting: Pulmonary Disease

## 2021-02-26 DIAGNOSIS — J189 Pneumonia, unspecified organism: Secondary | ICD-10-CM

## 2021-02-26 NOTE — Patient Instructions (Signed)
  Bronchitis seems to be getting better Okay to take Delsym OTC cough syrup 5 mL twice daily as needed  Let me know if breathing is worse, phlegm increases or turns yellow/green or if you develop a fever  We will treat to a CT scan in the future or if cough Persists

## 2021-02-26 NOTE — Progress Notes (Signed)
   Subjective:    Patient ID: Diana Stout, female    DOB: 1930-07-22, 87 y.o.   MRN: [id number]  HPI  87 yo never smoker with history of mild bronchiectasis and recurrent pneumonia    PMH - GERD ,sleeps with a wedge pillow    She used to develop recurrent pneumonias and last such episode was 2019, she had an uneventful 2 to 3 years but had an  Acute OV 02/06/21 for increased cough, congestion, wheezing and shortness of breath, not relieved by zpak , given augmentin + pred dosepak Chest x-ray 11/1 showed interstitial opacities both bases She is now left with a dry cough, yesterday she had minimal blood-tinged sputum She feels more than 50% improved but cough is persistent  MRI 12/2018 showed chronic lacunar infarct on the left and she has a cardiac monitor to look for arrhythmias   Significant tests/ events reviewed   HRCT 01/2017 no bronchiectasis, mild air trapping CT chest 04/2012 stable pulmonary nodules compared to 05/2011, small  hiatal hernia   Spirometry 01/2017 - no evidence of airway obstruction   CT chest images from 2013 and 2014 >> mild bronchiectasis  Review of Systems neg for any significant sore throat, dysphagia, itching, sneezing, nasal congestion or excess/ purulent secretions, fever, chills, sweats, unintended wt loss, pleuritic or exertional cp, hempoptysis, orthopnea pnd or change in chronic leg swelling. Also denies presyncope, palpitations, heartburn, abdominal pain, nausea, vomiting, diarrhea or change in bowel or urinary habits, dysuria,hematuria, rash, arthralgias, visual complaints, headache, numbness weakness or ataxia.     Objective:   Physical Exam  Gen. Pleasant, obese, in no distress ENT - no lesions, no post nasal drip Neck: No JVD, no thyromegaly, no carotid bruits Lungs: no use of accessory muscles, no dullness to percussion, decreased without rales or rhonchi  Cardiovascular: Rhythm regular, heart sounds  normal, no murmurs or gallops, no  peripheral edema Musculoskeletal: No deformities, no cyanosis or clubbing , no tremors       Assessment & Plan:

## 2021-02-26 NOTE — Assessment & Plan Note (Signed)
She seems to have an episode of acute bronchitis which is resolved but left behind a persistent cough.  She does not have any bronchospasm today so no need for more steroids.  Sputum production is subsided so do not feel the need for more antibiotics.  She is 50% improved, blood-tinged sputum is concerning but likely related to increased coughing and we can follow clinically. I have asked her to take over-the-counter cough suppressant such as Delsym for symptomatic relief.  She will call back for increased symptoms. And if persistent will consider proceeding with a CT chest to further evaluate

## 2021-03-07 DIAGNOSIS — R002 Palpitations: Secondary | ICD-10-CM | POA: Diagnosis not present

## 2021-03-07 DIAGNOSIS — I6381 Other cerebral infarction due to occlusion or stenosis of small artery: Secondary | ICD-10-CM | POA: Diagnosis not present

## 2021-03-08 ENCOUNTER — Encounter: Payer: Self-pay | Admitting: Neurology

## 2021-03-08 ENCOUNTER — Other Ambulatory Visit: Payer: Self-pay

## 2021-03-08 ENCOUNTER — Ambulatory Visit: Payer: PPO | Admitting: Neurology

## 2021-03-08 VITALS — BP 139/83 | HR 78 | Ht 59.0 in | Wt 170.0 lb

## 2021-03-08 DIAGNOSIS — I6381 Other cerebral infarction due to occlusion or stenosis of small artery: Secondary | ICD-10-CM | POA: Diagnosis not present

## 2021-03-08 NOTE — Progress Notes (Signed)
Chief Complaint  Patient presents with   New Patient (Initial Visit)    Rm 10. Alone. PCP is Dr. Tyrone Apple Plotnikov. NP Internal referral for MRI w/ history of embolic stroke. Pt complains of headache daily for past week. No complaints of headache today.      ASSESSMENT AND PLAN  Diana Stout is a 87 y.o. female   Lacunar infarction by MRI  She does has vascular risk factor of aging, hypertension, hyperlipidemia, LDL 139 LDL 139  Has been taking aspirin 81 mg daily,  Ultrasound of carotid arteries showed no significant abnormality,  Was seen by cardiologist, 14 days cardiac monitoring showed intermittent supra-ventricular tachycardia, no cardiac follow-up pending  Complete stroke evaluation with echocardiogram,  Emphasized the patient importance of moderate exercise, increase water intake     DIAGNOSTIC DATA (LABS, IMAGING, TESTING) - I reviewed patient records, labs, notes, testing and imaging myself where available.   MEDICAL HISTORY:  Diana Stout is a 87 year old female, seen in request by Dr.   Quay Burow, Claudina Lick, MD for evaluation of lacunar stroke showed MRI scan, her primary care physician is Dr. Alain Marion, [name], MD, initial evaluation was on March 08, 2021.  I reviewed and summarized the referring note. PMHX HLD HTN  In September 2022, she presented with intermittent dizziness, transient only last for couple days, because of her complaints, she was referred for MRI of the brain, I personally reviewed the film, no evidence of acute abnormality, chronic lacunar infarction at the left corona radiata/basal ganglia, mild supratentorium small vessel disease  She has been taking aspirin 81 mg daily,  She did have intermittent heart racing fast, was seen by cardiologist Dr. Quentin Ore, suggested 14 days cardiac monitoring, reports pending  She denies a history of stroke, now is back to her baseline, mild gait abnormality due to hip pain, otherwise she is very active,  driving, doing yard work without any difficulties Ultrasound of carotid artery September 2022, no evidence of large vessel disease antegrade flow of bilateral carotid artery   PHYSICAL EXAM:   Vitals:   03/08/21 0940  BP: 139/83  Pulse: 78  Weight: 170 lb (77.1 kg)  Height: 4\' 11"  (1.499 m)   Not recorded     Body mass index is 34.34 kg/m.  PHYSICAL EXAMNIATION:  Gen: NAD, conversant, well nourised, well groomed                     Cardiovascular: Regular rate rhythm, no peripheral edema, warm, nontender. Eyes: Conjunctivae clear without exudates or hemorrhage Neck: Supple, no carotid bruits. Pulmonary: Clear to auscultation bilaterally   NEUROLOGICAL EXAM:  MENTAL STATUS: Speech:    Speech is normal; fluent and spontaneous with normal comprehension.  Cognition:     Orientation to time, place and person     Normal recent and remote memory     Normal Attention span and concentration     Normal Language, naming, repeating,spontaneous speech     Fund of knowledge   CRANIAL NERVES: CN II: Visual fields are full to confrontation. Pupils are round equal and briskly reactive to light. CN III, IV, VI: extraocular movement are normal. No ptosis. CN V: Facial sensation is intact to light touch CN VII: Face is symmetric with normal eye closure  CN VIII: Hearing is normal to causal conversation. CN IX, X: Phonation is normal. CN XI: Head turning and shoulder shrug are intact  MOTOR: There is no pronator drift of out-stretched arms. Muscle bulk  and tone are normal. Muscle strength is normal.  REFLEXES: Reflexes are 2+ and symmetric at the biceps, triceps, knees, and ankles. Plantar responses are flexor.  SENSORY: Intact to light touch, pinprick and vibratory sensation are intact in fingers and toes.  COORDINATION: There is no trunk or limb dysmetria noted.  GAIT/STANCE: Need push-up to get up from seated position, mildly antalgic stable  REVIEW OF SYSTEMS:  Full 14  system review of systems performed and notable only for as above All other review of systems were negative.   ALLERGIES: Allergies  Allergen Reactions   Atorvastatin Nausea And Vomiting    REACTION: Gi pain   Alendronate Sodium Other (See Comments)    REACTION: esophagus problem   Ceftriaxone Sodium Rash    REACTION: rash   Codeine Phosphate Other (See Comments)    REACTION: nightmares   Moxifloxacin Palpitations    REACTION: nausea/chest pain She can take Cipro   Simvastatin Other (See Comments)    indigestion    HOME MEDICATIONS: Current Outpatient Medications  Medication Sig Dispense Refill   albuterol (VENTOLIN HFA) 108 (90 Base) MCG/ACT inhaler Inhale 1 puff into the lungs every 6 (six) hours as needed. 8 g 2   Apple Cider Vinegar (APPLE CIDER VINEGAR ULTRA) 600 MG CAPS Take by mouth.     aspirin 81 MG tablet Take 81 mg by mouth daily.     cholecalciferol (VITAMIN D) 1000 UNITS tablet Take 1,000 Units by mouth daily.     cyanocobalamin 1000 MCG tablet Take 1,000 mcg by mouth daily.     fluticasone (FLONASE) 50 MCG/ACT nasal spray USE 2 SPRAYS IN EACH NOSTRIL AS NEEDED FOR RHINITIS 48 g 3   furosemide (LASIX) 20 MG tablet 1 po qd prn 90 tablet 1   KLOR-CON M10 10 MEQ tablet Take 1 tablet (10 mEq total) by mouth daily. 90 tablet 3   Magnesium 400 MG CAPS Take by mouth daily.     nitroGLYCERIN (NITROSTAT) 0.4 MG SL tablet Place 1 tablet (0.4 mg total) under the tongue every 5 (five) minutes as needed for chest pain. 25 tablet 3   Probiotic Product (ALIGN) 4 MG CAPS Take 1 capsule (4 mg total) by mouth daily. 30 capsule 0   Respiratory Therapy Supplies (FLUTTER) DEVI Use as directed 1 each 0   rosuvastatin (CRESTOR) 5 MG tablet Take 1 tablet (5 mg total) by mouth daily. 30 tablet 5   No current facility-administered medications for this visit.    PAST MEDICAL HISTORY: Past Medical History:  Diagnosis Date   Abdominal pain, lower 11/22/2010   2015 resolved off Zocor  5/17?urticaria w/GI upset: Dtr is allergic to seafood - the pt is on MegaRed krill oil - d/c MegaRed    Abnormal chest CT 05/22/2011   Followed in Pulmonary clinic/ [facility]/ [facility]  05/14/11 CT CHEST WITHOUT CONTRAST 1. Multiple small pulmonary nodules scattered throughout the lungs  bilaterally ranging in size from 3-6 mm.    2. There is a small hiatal hernia.  3. Atherosclerosis.  4. Status post cholecystectomy.  5. Low attenuation hepatic lesions unchanged compared to recent  abdominal CT scan, likely to represent cysts   ANEMIA, NORMOCYTIC 08/10/2009   mild     DIVERTICULOSIS, COLON 03/08/2007   Qualifier: Diagnosis of  By: [name] MD, [name]    Dizziness 07/19/2013   Likely due to diarrhea/meds 4/15    DVT (deep venous thrombosis) ([place]) 1998   hx of right   Dyslipidemia 02/02/2007  Chronic 1/13 worse     Dyspnea 07/01/2008   2/13 improved w/reg exercise 1/17 deconditioning - CL stress test was nl     Edema 09/27/2009   Qualifier: Diagnosis of  By: [name], MD, [name]    ESOPHAGEAL STRICTURE 03/06/2007   Qualifier: Diagnosis of  By: [name] MD, [name]    Esophagitis 2006   Essential hypertension 03/08/2007   Chronic  On Benicar    FOOT PAIN 07/25/2009   R foot OA     GERD 02/02/2007   Wedge pillow Protonix prn, Pepcid - d/c   History of pneumonia 12/2016   History of shingles    Hyperlipidemia    IBS 03/06/2007   Qualifier: Diagnosis of  By: [name] MD, [name]    Impaired glucose tolerance 11/22/2010   Kidney cysts    left- Dr [name]   Lung nodule 05/08/2011   04/15/11 RLL 4 mm nodule on abd CT - [facility] office    Obesity 01/03/2015   Osteoporosis 02/02/2007   Chronic  Vit D, yoga   Palpitations 05/12/2014   Dr [name] 2/16 poss due to steroid pack    Rash and nonspecific skin eruption 09/12/2015   5/17?urticaria Pt is allergic to seafood - the pt is on MegaRed krill oil - d/c MegaRed    Upper airway cough syndrome 12/08/2014   Dr [name] Flutter valve added 01/02/2015 >  resolved 02/03/2015  - rec try off gerd rx p 03/09/15     PAST SURGICAL HISTORY: Past Surgical History:  Procedure Laterality Date   APPENDECTOMY     CHOLECYSTECTOMY     HYSTEROSCOPY WITH RESECTOSCOPE  7/99   resect polyp   MOHS SURGERY  05/2018   face   TUBAL LIGATION Bilateral     FAMILY HISTORY: Family History  Problem Relation Age of Onset   Asthma Mother    Diabetes Father    Cancer Father        liver   Cancer Sister        breast   Diabetes Sister    Asthma Sister    Breast cancer Sister    Diabetes Brother    Asthma Brother    Asthma Sister    Diabetes Brother    Coronary artery disease Other        1 st degree female relative   Thyroid disease Maternal Uncle     SOCIAL HISTORY: Social History   Socioeconomic History   Marital status: Widowed    Spouse name: Not on file   Number of children: 3   Years of education: Not on file   Highest education level: Not on file  Occupational History   Occupation: Retired    Fish farm manager: RETIRED  Tobacco Use   Smoking status: Never   Smokeless tobacco: Never  Vaping Use   Vaping Use: Never used  Substance and Sexual Activity   Alcohol use: No   Drug use: No   Sexual activity: Not Currently  Other Topics Concern   Not on file  Social History Narrative   Not on file   Social Determinants of Health   Financial Resource Strain: Not on file  Food Insecurity: Not on file  Transportation Needs: Not on file  Physical Activity: Not on file  Stress: Not on file  Social Connections: Not on file  Intimate Partner Violence: Not on file      Marcial Pacas, M.D. Ph.D.  [facility] [address], [place], [place]  [address] Ph: [phone] Fax: [phone]  CC:  Binnie Rail, MD [place],   [id number]  Plotnikov, [name], MD

## 2021-03-14 ENCOUNTER — Telehealth: Payer: Self-pay | Admitting: Cardiology

## 2021-03-14 NOTE — Telephone Encounter (Signed)
Patient is calling to inform Dr. Quentin Ore that when she saw Dr. Krista Blue she pulled up her monitor results and reviewed them with her. However, she forgot to mention that 2-3 times during her monitoring cycle she accidentally drank coffee with caffeine. She would like to know if this alter her results.

## 2021-03-15 NOTE — Telephone Encounter (Signed)
Returned call to Pt.  Pt is questioning if she should proceed with loop monitor.  She has echo scheduled per neurology.  Will discuss with Dr. Quentin Ore.

## 2021-03-16 NOTE — Telephone Encounter (Signed)
Per Dr. Darolyn Rua with neurology   No further action needed with EP at this time.  She will follow with neurology to complete work up.  Pt will see EP as needed.

## 2021-04-03 ENCOUNTER — Other Ambulatory Visit: Payer: Self-pay | Admitting: Internal Medicine

## 2021-04-05 ENCOUNTER — Ambulatory Visit (HOSPITAL_COMMUNITY): Payer: PPO | Attending: Neurology

## 2021-04-05 ENCOUNTER — Other Ambulatory Visit: Payer: Self-pay

## 2021-04-05 DIAGNOSIS — I6381 Other cerebral infarction due to occlusion or stenosis of small artery: Secondary | ICD-10-CM | POA: Diagnosis not present

## 2021-04-05 LAB — ECHOCARDIOGRAM COMPLETE
Area-P 1/2: 4.49 cm2
S' Lateral: 2.8 cm

## 2021-04-23 ENCOUNTER — Telehealth: Payer: Self-pay | Admitting: Neurology

## 2021-04-23 NOTE — Telephone Encounter (Signed)
Dr. Krista Blue sent mychart message on 04/05/21:  [name]    Echocardiogram showed no significant abnormalities.  There is no cardiac embolic source identified.   If you have any questions about the report, please feel free to call office or MyChart message.   Marcial Pacas, M.D. Ph.D. _____________________________________  I called the patient and reviewed this information. She would also like a copy of the results. They have been placed in the mail to her home address.

## 2021-04-23 NOTE — Telephone Encounter (Signed)
Pt inquiring about results of Echocardiogram. Have not heard from anyone. Requesting a copy of test mailed to her home. Would like a call from the nurse

## 2021-04-30 ENCOUNTER — Ambulatory Visit: Payer: PPO | Admitting: Cardiovascular Disease

## 2021-05-30 ENCOUNTER — Other Ambulatory Visit: Payer: Self-pay

## 2021-05-30 ENCOUNTER — Ambulatory Visit: Payer: PPO | Admitting: Adult Health

## 2021-05-30 ENCOUNTER — Encounter: Payer: Self-pay | Admitting: Adult Health

## 2021-05-30 DIAGNOSIS — J418 Mixed simple and mucopurulent chronic bronchitis: Secondary | ICD-10-CM | POA: Diagnosis not present

## 2021-05-30 DIAGNOSIS — J42 Unspecified chronic bronchitis: Secondary | ICD-10-CM | POA: Insufficient documentation

## 2021-05-30 MED ORDER — ALBUTEROL SULFATE HFA 108 (90 BASE) MCG/ACT IN AERS: 1.0000 | INHALATION_SPRAY | Freq: Four times a day (QID) | RESPIRATORY_TRACT | 3 refills | Status: AC | PRN

## 2021-05-30 NOTE — Progress Notes (Signed)
@Patient  ID: Diana Stout, female    DOB: Nov 29, 1930, 88 y.o.   MRN: [id number]  Chief Complaint  Patient presents with   Follow-up    Referring provider: [name], MD  HPI: 88 year old female followed for chronic cough and recurrent pneumonia  TEST/EVENTS :  HRCT 01/2017 no bronchiectasis, mild air trapping CT chest 04/2012 stable pulmonary nodules compared to 05/2011, small  hiatal hernia   Spirometry 01/2017 - no evidence of airway obstruction   CT chest images from 2013 and 2014 >> mild bronchiectasis  05/30/2021 Follow up : Chronic cough, history of recurrent pneumonia Patient returns today for a 50-month follow-up.  Patient was having a slow to resolve bronchitic exacerbation November 2022.  Patient says it took her several weeks to finally get better.  She says over the last month or so she has been doing well with no increased cough or congestion.  She denies any hemoptysis, chest pain, orthopnea.  She says her activity level is okay.  She gets winded with heavy exercise or walking.  She is trying to become more active and do some light outdoor walking whenever the weather cooperates.  She says her appetite is good with no nausea vomiting or diarrhea.  She denies any increased albuterol use. She denies any increased reflux or sinus drainage.     Allergies  Allergen Reactions   Atorvastatin Nausea And Vomiting    REACTION: Gi pain   Alendronate Sodium Other (See Comments)    REACTION: esophagus problem   Ceftriaxone Sodium Rash    REACTION: rash   Codeine Phosphate Other (See Comments)    REACTION: nightmares   Moxifloxacin Palpitations    REACTION: nausea/chest pain She can take Cipro   Simvastatin Other (See Comments)    indigestion    Immunization History  Administered Date(s) Administered   Fluad Quad(high Dose 65+) 01/08/2019, 01/07/2020   Influenza Split 12/25/2011   Influenza Whole 02/17/2007, 01/17/2010, 01/07/2011, 01/07/2016   Influenza,  High Dose Seasonal PF 01/10/2017, 12/25/2017, 01/15/2021   Influenza,inj,Quad PF,6+ Mos 02/03/2015   Influenza-Unspecified 02/04/2013, 01/06/2014, 03/23/2014   Moderna SARS-COV2 Booster Vaccination 12/08/2019   Moderna Sars-Covid-2 Vaccination 04/19/2019, 05/18/2019   Pneumococcal Conjugate-13 03/09/2013   Pneumococcal Polysaccharide-23 02/18/2005, 07/22/2016   Td 06/20/2003, 12/22/2013   Tdap 08/25/2015   Zoster, Live 02/02/2007    Past Medical History:  Diagnosis Date   Abdominal pain, lower 11/22/2010   2015 resolved off Zocor 5/17?urticaria w/GI upset: Dtr is allergic to seafood - the pt is on MegaRed krill oil - d/c MegaRed    Abnormal chest CT 05/22/2011   [facility]  05/14/11 CT CHEST WITHOUT CONTRAST 1. Multiple small pulmonary nodules scattered throughout the lungs  bilaterally ranging in size from 3-6 mm.    2. There is a small hiatal hernia.  3. Atherosclerosis.  4. Status post cholecystectomy.  5. Low attenuation hepatic lesions unchanged compared to recent  abdominal CT scan, likely to represent cysts   ANEMIA, NORMOCYTIC 08/10/2009   mild     DIVERTICULOSIS, COLON 03/08/2007   Qualifier: Diagnosis of  By: [name] MD, [name]    Dizziness 07/19/2013   Likely due to diarrhea/meds 4/15    DVT (deep venous thrombosis) ([place]) 1998   hx of right   Dyslipidemia 02/02/2007   Chronic 1/13 worse     Dyspnea 07/01/2008   2/13 improved w/reg exercise 1/17 deconditioning - CL stress test was nl     Edema 09/27/2009  Qualifier: Diagnosis of  By: [name], MD, [name]    ESOPHAGEAL STRICTURE 03/06/2007   Qualifier: Diagnosis of  By: [name] MD, [name]    Esophagitis 2006   Essential hypertension 03/08/2007   Chronic  On Benicar    FOOT PAIN 07/25/2009   R foot OA     GERD 02/02/2007   Wedge pillow Protonix prn, Pepcid - d/c   History of pneumonia 12/2016   History of shingles    Hyperlipidemia    IBS 03/06/2007   Qualifier: Diagnosis  of  By: [name] MD, [name]    Impaired glucose tolerance 11/22/2010   Kidney cysts    left- Dr [name]   Lung nodule 05/08/2011   04/15/11 RLL 4 mm nodule on abd CT - [facility] office    Obesity 01/03/2015   Osteoporosis 02/02/2007   Chronic  Vit D, yoga   Palpitations 05/12/2014   Dr [name] 2/16 poss due to steroid pack    Rash and nonspecific skin eruption 09/12/2015   5/17?urticaria Pt is allergic to seafood - the pt is on MegaRed krill oil - d/c MegaRed    Upper airway cough syndrome 12/08/2014   Dr [name] Flutter valve added 01/02/2015 > resolved 02/03/2015  - rec try off gerd rx p 03/09/15     Tobacco History: Social History   Tobacco Use  Smoking Status Never  Smokeless Tobacco Never   Counseling given: Not Answered   Outpatient Medications Prior to Visit  Medication Sig Dispense Refill   Apple Cider Vinegar (APPLE CIDER VINEGAR ULTRA) 600 MG CAPS Take by mouth.     aspirin 81 MG tablet Take 81 mg by mouth daily.     cholecalciferol (VITAMIN D) 1000 UNITS tablet Take 1,000 Units by mouth daily.     cyanocobalamin 1000 MCG tablet Take 1,000 mcg by mouth daily.     fluticasone (FLONASE) 50 MCG/ACT nasal spray USE 2 SPRAYS IN EACH NOSTRIL AS NEEDED FOR RHINITIS 48 g 3   furosemide (LASIX) 20 MG tablet TAKE 1 TABLET(20 MG) BY MOUTH DAILY AS NEEDED 90 tablet 1   KLOR-CON M10 10 MEQ tablet Take 1 tablet (10 mEq total) by mouth daily. 90 tablet 3   Magnesium 400 MG CAPS Take by mouth daily.     nitroGLYCERIN (NITROSTAT) 0.4 MG SL tablet Place 1 tablet (0.4 mg total) under the tongue every 5 (five) minutes as needed for chest pain. 25 tablet 3   Probiotic Product (ALIGN) 4 MG CAPS Take 1 capsule (4 mg total) by mouth daily. 30 capsule 0   Respiratory Therapy Supplies (FLUTTER) DEVI Use as directed 1 each 0   rosuvastatin (CRESTOR) 5 MG tablet Take 1 tablet (5 mg total) by mouth daily. 30 tablet 5   albuterol (VENTOLIN HFA) 108 (90 Base) MCG/ACT inhaler Inhale 1 puff into the lungs every 6 (six)  hours as needed. 8 g 2   No facility-administered medications prior to visit.     Review of Systems:   Constitutional:   No  weight loss, night sweats,  Fevers, chills,  +fatigue, or  lassitude.  HEENT:   No headaches,  Difficulty swallowing,  Tooth/dental problems, or  Sore throat,                No sneezing, itching, ear ache, nasal congestion, post nasal drip,   CV:  No chest pain,  Orthopnea, PND, swelling in lower extremities, anasarca, dizziness, palpitations, syncope.   GI  No heartburn, indigestion, abdominal  pain, nausea, vomiting, diarrhea, change in bowel habits, loss of appetite, bloody stools.   Resp:   No chest wall deformity  Skin: no rash or lesions.  GU: no dysuria, change in color of urine, no urgency or frequency.  No flank pain, no hematuria   MS:  No joint pain or swelling.  No decreased range of motion.  No back pain.    Physical Exam  BP 124/68 (BP Location: Left Arm, Cuff Size: Large)    Pulse 82    Temp 97.7 F (36.5 C) (Temporal)    Ht 4\' 11"  (1.499 m)    Wt 169 lb 9.6 oz (76.9 kg)    LMP 04/08/1996    SpO2 98%    BMI 34.26 kg/m   GEN: A/Ox3; pleasant , NAD, well nourished    HEENT:  [place]/AT,  NOSE-clear, THROAT-clear, no lesions, no postnasal drip or exudate noted.   NECK:  Supple w/ fair ROM; no JVD; normal carotid impulses w/o bruits; no thyromegaly or nodules palpated; no lymphadenopathy.    RESP  Clear  P & A; w/o, wheezes/ rales/ or rhonchi. no accessory muscle use, no dullness to percussion  CARD:  RRR, no m/r/g, tr  peripheral edema, pulses intact, no cyanosis or clubbing.  GI:   Soft & nt; nml bowel sounds; no organomegaly or masses detected.   Musco: Warm bil, no deformities or joint swelling noted.   Neuro: alert, no focal deficits noted.    Skin: Warm, no lesions or rashes    Lab Results:  CBC    Component Value Date/Time   WBC 4.9 11/29/2020 0954   RBC 4.44 11/29/2020 0954   HGB 12.4 11/29/2020 0954   HCT 38.2  11/29/2020 0954   PLT 200.0 11/29/2020 0954   MCV 86.0 11/29/2020 0954   MCH 26.7 09/02/2015 1630   MCHC 32.5 11/29/2020 0954   RDW 15.4 11/29/2020 0954   LYMPHSABS 1.5 11/29/2020 0954   MONOABS 0.5 11/29/2020 0954   EOSABS 0.2 11/29/2020 0954   BASOSABS 0.0 11/29/2020 0954    BMET    Component Value Date/Time   NA 141 11/29/2020 0954   K 4.5 11/29/2020 0954   CL 104 11/29/2020 0954   CO2 29 11/29/2020 0954   GLUCOSE 98 11/29/2020 0954   BUN 17 11/29/2020 0954   CREATININE 0.79 11/29/2020 0954   CREATININE 0.74 09/29/2012 1429   CALCIUM 9.7 11/29/2020 0954   GFRNONAA 60 (L) 09/02/2015 1630   GFRAA >60 09/02/2015 1630    BNP No results found for: BNP  ProBNP No results found for: PROBNP  Imaging: No results found.    No flowsheet data found.  No results found for: NITRICOXIDE      Assessment & Plan:   Chronic bronchitis ([place]) Recent slow to resolve exacerbation late fall 2022.  Patient is now improved and returned back to baseline.  Previous high-resolution CT chest showed postinflammatory scarring with no significant bronchiectasis in 2018.  Patient is continue on cough suppression regimen along with trigger prevention.  She is prone to recurrent chronic cough.  Also prone to recurrent pneumonia.  Patient is back to baseline.  Continue on current regimen.  Plan  Patient Instructions  Albuterol inhaler 1 puff every 6hr as needed for shortness of breath /wheezing .  Mucinex twice daily as needed for cough and congestion Delsym 2 tsp Twice daily As needed  cough  Sips of water to soothe throat to avoid throat clearing and cough .  Prilosec 20mg   daily As needed   Claritin 10 mg daily  as needed for drainage Saline nasal rinses as needed Follow-up with Dr. Elsworth Soho in 4 months  and As needed   Please contact office for sooner follow up if symptoms do not improve or worsen or seek emergency care         Rexene Edison, NP 05/30/2021

## 2021-05-30 NOTE — Patient Instructions (Addendum)
Albuterol inhaler 1 puff every 6hr as needed for shortness of breath /wheezing .  Mucinex twice daily as needed for cough and congestion Delsym 2 tsp Twice daily As needed  cough  Sips of water to soothe throat to avoid throat clearing and cough .  Prilosec 20mg  daily As needed   Claritin 10 mg daily  as needed for drainage Saline nasal rinses as needed Follow-up with Dr. Elsworth Soho in 4 months  and As needed   Please contact office for sooner follow up if symptoms do not improve or worsen or seek emergency care

## 2021-05-30 NOTE — Assessment & Plan Note (Signed)
Recent slow to resolve exacerbation late fall 2022.  Patient is now improved and returned back to baseline.  Previous high-resolution CT chest showed postinflammatory scarring with no significant bronchiectasis in 2018.  Patient is continue on cough suppression regimen along with trigger prevention.  She is prone to recurrent chronic cough.  Also prone to recurrent pneumonia.  Patient is back to baseline.  Continue on current regimen.  Plan  Patient Instructions  Albuterol inhaler 1 puff every 6hr as needed for shortness of breath /wheezing .  Mucinex twice daily as needed for cough and congestion Delsym 2 tsp Twice daily As needed  cough  Sips of water to soothe throat to avoid throat clearing and cough .  Prilosec 20mg  daily As needed   Claritin 10 mg daily  as needed for drainage Saline nasal rinses as needed Follow-up with Dr. Elsworth Soho in 4 months  and As needed   Please contact office for sooner follow up if symptoms do not improve or worsen or seek emergency care

## 2021-06-06 ENCOUNTER — Ambulatory Visit (INDEPENDENT_AMBULATORY_CARE_PROVIDER_SITE_OTHER): Payer: PPO | Admitting: Internal Medicine

## 2021-06-06 ENCOUNTER — Encounter: Payer: Self-pay | Admitting: Internal Medicine

## 2021-06-06 ENCOUNTER — Other Ambulatory Visit: Payer: Self-pay

## 2021-06-06 DIAGNOSIS — E875 Hyperkalemia: Secondary | ICD-10-CM

## 2021-06-06 DIAGNOSIS — M545 Low back pain, unspecified: Secondary | ICD-10-CM

## 2021-06-06 DIAGNOSIS — R609 Edema, unspecified: Secondary | ICD-10-CM | POA: Diagnosis not present

## 2021-06-06 DIAGNOSIS — G8929 Other chronic pain: Secondary | ICD-10-CM | POA: Diagnosis not present

## 2021-06-06 DIAGNOSIS — I10 Essential (primary) hypertension: Secondary | ICD-10-CM | POA: Diagnosis not present

## 2021-06-06 DIAGNOSIS — R7303 Prediabetes: Secondary | ICD-10-CM | POA: Diagnosis not present

## 2021-06-06 DIAGNOSIS — E785 Hyperlipidemia, unspecified: Secondary | ICD-10-CM

## 2021-06-06 LAB — URINALYSIS
Bilirubin Urine: NEGATIVE
Hgb urine dipstick: NEGATIVE
Ketones, ur: NEGATIVE
Leukocytes,Ua: NEGATIVE
Nitrite: NEGATIVE
Specific Gravity, Urine: 1.025 (ref 1.000–1.030)
Total Protein, Urine: NEGATIVE
Urine Glucose: NEGATIVE
Urobilinogen, UA: 0.2 (ref 0.0–1.0)
pH: 5.5 (ref 5.0–8.0)

## 2021-06-06 LAB — HEMOGLOBIN A1C: Hgb A1c MFr Bld: 6.3 % (ref 4.6–6.5)

## 2021-06-06 LAB — COMPREHENSIVE METABOLIC PANEL
ALT: 18 U/L (ref 0–35)
AST: 26 U/L (ref 0–37)
Albumin: 4.2 g/dL (ref 3.5–5.2)
Alkaline Phosphatase: 83 U/L (ref 39–117)
BUN: 17 mg/dL (ref 6–23)
CO2: 31 mEq/L (ref 19–32)
Calcium: 9.9 mg/dL (ref 8.4–10.5)
Chloride: 103 mEq/L (ref 96–112)
Creatinine, Ser: 0.79 mg/dL (ref 0.40–1.20)
GFR: 66.85 mL/min (ref >60.00)
Glucose, Bld: 104 mg/dL — ABNORMAL HIGH (ref 70–99)
Potassium: 5.3 mEq/L — ABNORMAL HIGH (ref 3.5–5.1)
Sodium: 140 mEq/L (ref 135–145)
Total Bilirubin: 0.7 mg/dL (ref 0.2–1.2)
Total Protein: 7.4 g/dL (ref 6.0–8.3)

## 2021-06-06 MED ORDER — FUROSEMIDE 20 MG PO TABS: ORAL_TABLET | ORAL | 1 refills | Status: DC

## 2021-06-06 MED ORDER — FLUTICASONE PROPIONATE 50 MCG/ACT NA SUSP: NASAL | 3 refills | Status: DC

## 2021-06-06 MED ORDER — KLOR-CON M10 10 MEQ PO TBCR
10.0000 meq | EXTENDED_RELEASE_TABLET | Freq: Every day | ORAL | 3 refills | Status: DC
Start: 2021-06-06 — End: 2021-06-06

## 2021-06-06 MED ORDER — KLOR-CON M10 10 MEQ PO TBCR: 10.0000 meq | EXTENDED_RELEASE_TABLET | Freq: Every day | ORAL | 3 refills | Status: DC

## 2021-06-06 MED ORDER — ROSUVASTATIN CALCIUM 5 MG PO TABS: 5.0000 mg | ORAL_TABLET | Freq: Every day | ORAL | 5 refills | Status: DC

## 2021-06-06 NOTE — Assessment & Plan Note (Signed)
Compression socks or sleeves ?Cont on Lasix, Kdur prn ?

## 2021-06-06 NOTE — Patient Instructions (Signed)
Compression socks or sleeves ? ?

## 2021-06-06 NOTE — Assessment & Plan Note (Signed)
Doing well ?Vit D ?

## 2021-06-06 NOTE — Assessment & Plan Note (Signed)
On Crestor 

## 2021-06-06 NOTE — Progress Notes (Signed)
Subjective:  Patient ID: Diana Stout, female    DOB: [birth year]  Age: 88 y.o. MRN: [id number]  CC: Follow-up (No concerns)   HPI Diana Stout presents for edema, urinary frequency, OA - chronic  Outpatient Medications Prior to Visit  Medication Sig Dispense Refill   albuterol (VENTOLIN HFA) 108 (90 Base) MCG/ACT inhaler Inhale 1 puff into the lungs every 6 (six) hours as needed. 8 g 3   Apple Cider Vinegar (APPLE CIDER VINEGAR ULTRA) 600 MG CAPS Take by mouth.     aspirin 81 MG tablet Take 81 mg by mouth daily.     cholecalciferol (VITAMIN D) 1000 UNITS tablet Take 1,000 Units by mouth daily.     cyanocobalamin 1000 MCG tablet Take 1,000 mcg by mouth daily.     Magnesium 400 MG CAPS Take by mouth daily.     nitroGLYCERIN (NITROSTAT) 0.4 MG SL tablet Place 1 tablet (0.4 mg total) under the tongue every 5 (five) minutes as needed for chest pain. 25 tablet 3   Probiotic Product (ALIGN) 4 MG CAPS Take 1 capsule (4 mg total) by mouth daily. 30 capsule 0   Respiratory Therapy Supplies (FLUTTER) DEVI Use as directed 1 each 0   UNABLE TO FIND daily. Med Name: Balance of Nature Whole Produce Fruits capsules  Maintain Blend 731 mg Protect Blend 719 mg Repair Blend 561 mg     fluticasone (FLONASE) 50 MCG/ACT nasal spray USE 2 SPRAYS IN EACH NOSTRIL AS NEEDED FOR RHINITIS 48 g 3   furosemide (LASIX) 20 MG tablet TAKE 1 TABLET(20 MG) BY MOUTH DAILY AS NEEDED 90 tablet 1   KLOR-CON M10 10 MEQ tablet Take 1 tablet (10 mEq total) by mouth daily. 90 tablet 3   rosuvastatin (CRESTOR) 5 MG tablet Take 1 tablet (5 mg total) by mouth daily. 30 tablet 5   No facility-administered medications prior to visit.    ROS: Review of Systems  Constitutional:  Negative for activity change, appetite change, chills, fatigue and unexpected weight change.  HENT:  Negative for congestion, mouth sores and sinus pressure.   Eyes:  Negative for visual disturbance.  Respiratory:  Negative for cough and chest  tightness.   Cardiovascular:  Positive for leg swelling.  Gastrointestinal:  Negative for abdominal pain and nausea.  Genitourinary:  Positive for frequency and urgency. Negative for difficulty urinating and vaginal pain.  Musculoskeletal:  Positive for arthralgias and back pain. Negative for gait problem.  Skin:  Negative for color change, pallor and rash.  Neurological:  Negative for dizziness, tremors, weakness, numbness and headaches.  Psychiatric/Behavioral:  Negative for confusion and sleep disturbance.    Objective:  BP 132/78 (BP Location: Left Arm, Patient Position: Sitting, Cuff Size: Large)    Pulse 73    Temp 97.7 F (36.5 C) (Oral)    Ht 4\' 11"  (1.499 m)    Wt 165 lb 12.8 oz (75.2 kg)    LMP 04/08/1996    SpO2 99%    BMI 33.49 kg/m   BP Readings from Last 3 Encounters:  06/06/21 132/78  05/30/21 124/68  03/08/21 139/83    Wt Readings from Last 3 Encounters:  06/06/21 165 lb 12.8 oz (75.2 kg)  05/30/21 169 lb 9.6 oz (76.9 kg)  03/08/21 170 lb (77.1 kg)    Physical Exam Constitutional:      General: She is not in acute distress.    Appearance: She is well-developed. She is obese.  HENT:     Head:  Normocephalic.     Right Ear: External ear normal.     Left Ear: External ear normal.     Nose: Nose normal.  Eyes:     General:        Right eye: No discharge.        Left eye: No discharge.     Conjunctiva/sclera: Conjunctivae normal.     Pupils: Pupils are equal, round, and reactive to light.  Neck:     Thyroid: No thyromegaly.     Vascular: No JVD.     Trachea: No tracheal deviation.  Cardiovascular:     Rate and Rhythm: Normal rate and regular rhythm.     Heart sounds: Normal heart sounds.  Pulmonary:     Effort: No respiratory distress.     Breath sounds: No stridor. No wheezing.  Abdominal:     General: Bowel sounds are normal. There is no distension.     Palpations: Abdomen is soft. There is no mass.     Tenderness: There is no abdominal tenderness.  There is no guarding or rebound.  Musculoskeletal:        General: No tenderness.     Cervical back: Normal range of motion and neck supple. No rigidity.     Right lower leg: Edema present.     Left lower leg: Edema present.  Lymphadenopathy:     Cervical: No cervical adenopathy.  Skin:    Findings: No erythema or rash.  Neurological:     Mental Status: She is oriented to person, place, and time.     Cranial Nerves: No cranial nerve deficit.     Motor: No abnormal muscle tone.     Coordination: Coordination normal.     Deep Tendon Reflexes: Reflexes normal.  Psychiatric:        Behavior: Behavior normal.        Thought Content: Thought content normal.        Judgment: Judgment normal.  Trace edema  Lab Results  Component Value Date   WBC 4.9 11/29/2020   HGB 12.4 11/29/2020   HCT 38.2 11/29/2020   PLT 200.0 11/29/2020   GLUCOSE 98 11/29/2020   CHOL 206 (H) 11/29/2020   TRIG 102.0 11/29/2020   HDL 47.00 11/29/2020   LDLDIRECT 154.9 05/01/2011   LDLCALC 139 (H) 11/29/2020   ALT 12 11/29/2020   AST 17 11/29/2020   NA 141 11/29/2020   K 4.5 11/29/2020   CL 104 11/29/2020   CREATININE 0.79 11/29/2020   BUN 17 11/29/2020   CO2 29 11/29/2020   TSH 0.95 11/29/2020   HGBA1C 5.9 08/26/2019    VAS US CAROTID  Result Date: 01/03/2021 Carotid Arterial Duplex Study Patient Name:  Diana Stout  Date of Exam:   01/03/2021 Medical Rec #: [id number]        Accession #:    [id number] Date of Birth: 10/22/1930       Patient Gender: F Patient Age:   18 years Exam Location:  [facility] Procedure:      VAS US CAROTID Referring Phys: Diana Stout --------------------------------------------------------------------------------  Indications:       CVA and patient reports 3 days of dizziness with on-and-off                    balance. She denies any other cerebrovascular symptoms. Acute                    CVA noted on recent MRI on 12/22/2020.  Risk Factors:      Hypertension, hyperlipidemia, no  history of smoking, coronary                    artery disease, prior CVA. Comparison Study:  NA Performing Technologist: Sharlett Iles RVT  Examination Guidelines: A complete evaluation includes B-mode imaging, spectral Doppler, color Doppler, and power Doppler as needed of all accessible portions of each vessel. Bilateral testing is considered an integral part of a complete examination. Limited examinations for reoccurring indications may be performed as noted.  Right Carotid Findings: +----------+--------+--------+--------+------------------+----------+             PSV cm/s EDV cm/s Stenosis Plaque Description Comments    +----------+--------+--------+--------+------------------+----------+  CCA Prox   77       13                                   tortuous    +----------+--------+--------+--------+------------------+----------+  CCA Distal 105      23                                               +----------+--------+--------+--------+------------------+----------+  ICA Prox   104      17       Normal                                  +----------+--------+--------+--------+------------------+----------+  ICA Mid    60       18                                   steep dive  +----------+--------+--------+--------+------------------+----------+  ICA Distal 86       23                                   tortuous    +----------+--------+--------+--------+------------------+----------+  ECA        76       6                                                +----------+--------+--------+--------+------------------+----------+ +----------+--------+-------+----------------+-------------------+             PSV cm/s EDV cms Describe         Arm Pressure (mmHG)  +----------+--------+-------+----------------+-------------------+  Subclavian 140              Multiphasic, WNL 130                  +----------+--------+-------+----------------+-------------------+ +---------+--------+--+--------+--+---------+  Vertebral PSV  cm/s 46 EDV cm/s 10 Antegrade  +---------+--------+--+--------+--+---------+  Left Carotid Findings: +----------+--------+--------+--------+------------------+--------+             PSV cm/s EDV cm/s Stenosis Plaque Description Comments  +----------+--------+--------+--------+------------------+--------+  CCA Prox   80       11  tortuous  +----------+--------+--------+--------+------------------+--------+  CCA Distal 84       18                                             +----------+--------+--------+--------+------------------+--------+  ICA Prox   71       16       Normal                                +----------+--------+--------+--------+------------------+--------+  ICA Mid    149      35                                   tortuous  +----------+--------+--------+--------+------------------+--------+  ICA Distal 128      34                                   tortuous  +----------+--------+--------+--------+------------------+--------+  ECA        88       15                                             +----------+--------+--------+--------+------------------+--------+ +----------+--------+--------+----------------+-------------------+             PSV cm/s EDV cm/s Describe         Arm Pressure (mmHG)  +----------+--------+--------+----------------+-------------------+  Subclavian 125               Multiphasic, WNL 130                  +----------+--------+--------+----------------+-------------------+ +---------+--------+--+--------+--+---------+  Vertebral PSV cm/s 53 EDV cm/s 12 Antegrade  +---------+--------+--+--------+--+---------+   Summary: Right Carotid: There is no evidence of stenosis in the right ICA. There was no                evidence of thrombus, dissection, atherosclerotic plaque or                stenosis in the cervical carotid system. Left Carotid: There is no evidence of stenosis in the left ICA. There was no               evidence of thrombus, dissection,  atherosclerotic plaque or               stenosis in the cervical carotid system. Vertebrals:  Bilateral vertebral arteries demonstrate antegrade flow. Subclavians: Normal flow hemodynamics were seen in bilateral subclavian              arteries. *See table(s) above for measurements and observations.  Electronically signed by Ida Rogue MD on 01/03/2021 at 3:58:29 PM.    Final     Assessment & Plan:   Problem List Items Addressed This Visit     Dyslipidemia    On Crestor      Relevant Medications   rosuvastatin (CRESTOR) 5 MG tablet   Other Relevant Orders   Comprehensive metabolic panel   Edema    Compression socks or sleeves Cont on Lasix, Kdur prn      Relevant Orders   Comprehensive metabolic panel  Urinalysis   Essential hypertension    NAS diet      Relevant Medications   rosuvastatin (CRESTOR) 5 MG tablet   furosemide (LASIX) 20 MG tablet   Low back pain    Doing well Vit D      Prediabetes    Check A1c      Relevant Orders   Hemoglobin A1c      Meds ordered this encounter  Medications   rosuvastatin (CRESTOR) 5 MG tablet    Sig: Take 1 tablet (5 mg total) by mouth daily.    Dispense:  30 tablet    Refill:  5   DISCONTD: KLOR-CON M10 10 MEQ tablet    Sig: Take 1 tablet (10 mEq total) by mouth daily.    Dispense:  90 tablet    Refill:  3   furosemide (LASIX) 20 MG tablet    Sig: TAKE 1 TABLET(20 MG) BY MOUTH DAILY AS NEEDED    Dispense:  90 tablet    Refill:  1   fluticasone (FLONASE) 50 MCG/ACT nasal spray    Sig: USE 2 SPRAYS IN EACH NOSTRIL AS NEEDED FOR RHINITIS    Dispense:  48 g    Refill:  3   KLOR-CON M10 10 MEQ tablet    Sig: Take 1 tablet (10 mEq total) by mouth daily.    Dispense:  90 tablet    Refill:  3      Follow-up: Return in about 6 months (around 12/07/2021) for Wellness Exam.  Walker Kehr, MD

## 2021-06-06 NOTE — Assessment & Plan Note (Signed)
Check A1c. 

## 2021-06-06 NOTE — Assessment & Plan Note (Signed)
NAS diet 

## 2021-06-10 ENCOUNTER — Other Ambulatory Visit: Payer: Self-pay | Admitting: Internal Medicine

## 2021-06-10 DIAGNOSIS — E875 Hyperkalemia: Secondary | ICD-10-CM | POA: Insufficient documentation

## 2021-06-10 NOTE — Assessment & Plan Note (Signed)
Discontinue potassium

## 2021-07-06 ENCOUNTER — Telehealth: Payer: Self-pay | Admitting: Cardiology

## 2021-07-06 NOTE — Telephone Encounter (Addendum)
Left message to call back ? ?From PCP OV 01/02/21: ?History of CVA: ?Acute ?Seen on recent MRI ?She was not aware of this and discussed that she may not have had any symptoms or may have had very subtle symptoms ?Will get carotid ultrasounds ?I have already referred her to neurology and she has an appointment ?She does see cardiology and discussed that she should discuss with him if she should have further testing for possible arrhythmia-she has had that in the past ?Continue aspirin 81 mg daily ?Blood pressure is well controlled ?Will start Crestor 5 mg daily ?Discussed the importance of a healthy lifestyle ?

## 2021-07-06 NOTE — Telephone Encounter (Signed)
Patient called in with questions about a stroke on 05/16/2014. Patient says she didn't have a stroke and this is the first she's heard of it. Contact for patient is [phone] (H) or [phone] (C). 07/06/2021 JMM ?

## 2021-07-13 ENCOUNTER — Telehealth: Payer: Self-pay | Admitting: Cardiology

## 2021-07-13 NOTE — Telephone Encounter (Signed)
Patient called stating in her records it states she had a stroke, but she states she didn't have a stroke.  It also states it says she has family history heart disease, she said she has no family history of heart disease.  ?

## 2021-07-13 NOTE — Telephone Encounter (Signed)
From PCP OV 01/02/21: ?History of CVA: ?Acute ?Seen on recent MRI ?She was not aware of this and discussed that she may not have had any symptoms or may have had very subtle symptoms ?Will get carotid ultrasounds ?I have already referred her to neurology and she has an appointment ?She does see cardiology and discussed that she should discuss with him if she should have further testing for possible arrhythmia-she has had that in the past ?Continue aspirin 81 mg daily ?Blood pressure is well controlled ?Will start Crestor 5 mg daily ?Discussed the importance of a healthy lifestyle ? ?Patient stated she knew about this however in her echo study report it states  ? recent 05/16/2014. Stroke,  ?                Signs/Symptoms:Dizziness/Lightheadedness, Edema and Dyspnea;  ? ?Advised that our office completed the testing and read the report but other study report information did not come from [place].  ?Verbalized understanding.  ?

## 2021-07-18 NOTE — Telephone Encounter (Signed)
See other phone note

## 2021-09-12 ENCOUNTER — Encounter: Payer: Self-pay | Admitting: Pulmonary Disease

## 2021-09-12 ENCOUNTER — Ambulatory Visit (INDEPENDENT_AMBULATORY_CARE_PROVIDER_SITE_OTHER): Payer: PPO | Admitting: Pulmonary Disease

## 2021-09-12 DIAGNOSIS — J418 Mixed simple and mucopurulent chronic bronchitis: Secondary | ICD-10-CM

## 2021-09-12 NOTE — Patient Instructions (Signed)
  You are doing well !

## 2021-09-12 NOTE — Assessment & Plan Note (Signed)
Overall she has done well by seeking attention for her acute symptoms and has not had pneumonia more recently.  We again discussed and reemphasized plan for an acute exacerbation. She is not on any maintenance inhalers

## 2021-09-12 NOTE — Progress Notes (Signed)
   Subjective:    Patient ID: Diana Stout, female    DOB: [birth year], 88 y.o.   MRN: [id number]  HPI  88 yo never smoker with history of mild bronchiectasis and recurrent pneumonia    PMH - GERD ,sleeps with a wedge pillow  MRI 12/2018 showed chronic lacunar infarct on the left  She had an episode of bronchitis that took longer to resolve in the fall 2022 and then another mild flare in February but this time did not require antibiotics or steroids.  She denies reflux, postnasal drip.  She rarely needs her rescue inhaler She denies constant mucus production  Significant tests/ events reviewed  HRCT 01/2017 no bronchiectasis, mild air trapping CT chest 04/2012 stable pulmonary nodules compared to 05/2011, small  hiatal hernia   Spirometry 01/2017 - no evidence of airway obstruction   CT chest images from 2013 and 2014 >> mild bronchiectasis     Review of Systems neg for any significant sore throat, dysphagia, itching, sneezing, nasal congestion or excess/ purulent secretions, fever, chills, sweats, unintended wt loss, pleuritic or exertional cp, hempoptysis, orthopnea pnd or change in chronic leg swelling. Also denies presyncope, palpitations, heartburn, abdominal pain, nausea, vomiting, diarrhea or change in bowel or urinary habits, dysuria,hematuria, rash, arthralgias, visual complaints, headache, numbness weakness or ataxia.     Objective:   Physical Exam  Gen. Pleasant, well-nourished, elderly, in no distress ENT - no thrush, no pallor/icterus,no post nasal drip Neck: No JVD, no thyromegaly, no carotid bruits Lungs: no use of accessory muscles, no dullness to percussion, clear without rales or rhonchi  Cardiovascular: Rhythm regular, heart sounds  normal, no murmurs or gallops, no peripheral edema Musculoskeletal: No deformities, no cyanosis or clubbing         Assessment & Plan:

## 2021-09-13 DIAGNOSIS — N3000 Acute cystitis without hematuria: Secondary | ICD-10-CM | POA: Diagnosis not present

## 2021-10-11 DIAGNOSIS — L570 Actinic keratosis: Secondary | ICD-10-CM | POA: Diagnosis not present

## 2021-10-11 DIAGNOSIS — Z85828 Personal history of other malignant neoplasm of skin: Secondary | ICD-10-CM | POA: Diagnosis not present

## 2021-10-11 DIAGNOSIS — L821 Other seborrheic keratosis: Secondary | ICD-10-CM | POA: Diagnosis not present

## 2021-10-11 DIAGNOSIS — D0439 Carcinoma in situ of skin of other parts of face: Secondary | ICD-10-CM | POA: Diagnosis not present

## 2021-10-11 DIAGNOSIS — L820 Inflamed seborrheic keratosis: Secondary | ICD-10-CM | POA: Diagnosis not present

## 2021-10-11 DIAGNOSIS — D225 Melanocytic nevi of trunk: Secondary | ICD-10-CM | POA: Diagnosis not present

## 2021-10-11 DIAGNOSIS — D485 Neoplasm of uncertain behavior of skin: Secondary | ICD-10-CM | POA: Diagnosis not present

## 2021-10-17 ENCOUNTER — Encounter (HOSPITAL_BASED_OUTPATIENT_CLINIC_OR_DEPARTMENT_OTHER): Payer: Self-pay | Admitting: Obstetrics & Gynecology

## 2021-10-17 ENCOUNTER — Ambulatory Visit (INDEPENDENT_AMBULATORY_CARE_PROVIDER_SITE_OTHER): Payer: PPO | Admitting: Obstetrics & Gynecology

## 2021-10-17 VITALS — BP 117/75 | HR 102 | Ht <= 58 in | Wt 166.0 lb

## 2021-10-17 DIAGNOSIS — Z01419 Encounter for gynecological examination (general) (routine) without abnormal findings: Secondary | ICD-10-CM | POA: Diagnosis not present

## 2021-10-17 DIAGNOSIS — E2839 Other primary ovarian failure: Secondary | ICD-10-CM | POA: Diagnosis not present

## 2021-10-17 DIAGNOSIS — M8580 Other specified disorders of bone density and structure, unspecified site: Secondary | ICD-10-CM

## 2021-10-17 DIAGNOSIS — Z86718 Personal history of other venous thrombosis and embolism: Secondary | ICD-10-CM

## 2021-10-17 DIAGNOSIS — Z1231 Encounter for screening mammogram for malignant neoplasm of breast: Secondary | ICD-10-CM | POA: Diagnosis not present

## 2021-10-17 NOTE — Progress Notes (Signed)
88 y.o. G45P3003 Widowed White or Caucasian female here for breast and pelvic exam due to family history of breast cancer in her sister.  Patient and I discussed decreasing frequency of exams.  She may plan to come in two years.  Knows if has any vaginal bleeding or new pelvic pain or breast issues, she should call.  Currently denies vaginal bleeding.  Patient's last menstrual period was 04/08/1996.          Sexually active: No.  H/O STD:  no  Health Maintenance: PCP:  [facility].  Last wellness appt was 12/2020.  Did blood work at that appt. Vaccines are up to date:  yes Colonoscopy:  05/29/2010 MMG:  01/26/20  BMD:  11/17/2014 Last pap smear:  02/16/16.   H/o abnormal pap smear:  no    reports that she has never smoked. She has never used smokeless tobacco. She reports that she does not drink alcohol and does not use drugs.  Past Medical History:  Diagnosis Date   Abdominal pain, lower 11/22/2010   2015 resolved off Zocor 5/17?urticaria w/GI upset: Dtr is allergic to seafood - the pt is on MegaRed krill oil - d/c MegaRed    Abnormal chest CT 05/22/2011   Followed in Pulmonary clinic/ [facility]/ [facility]  05/14/11 CT CHEST WITHOUT CONTRAST 1. Multiple small pulmonary nodules scattered throughout the lungs  bilaterally ranging in size from 3-6 mm.    2. There is a small hiatal hernia.  3. Atherosclerosis.  4. Status post cholecystectomy.  5. Low attenuation hepatic lesions unchanged compared to recent  abdominal CT scan, likely to represent cysts   ANEMIA, NORMOCYTIC 08/10/2009   mild     DIVERTICULOSIS, COLON 03/08/2007   Qualifier: Diagnosis of  By: [name] MD, [name]    Dizziness 07/19/2013   Likely due to diarrhea/meds 4/15    DVT (deep venous thrombosis) ([place]) 1998   hx of right   Dyslipidemia 02/02/2007   Chronic 1/13 worse     Dyspnea 07/01/2008   2/13 improved w/reg exercise 1/17 deconditioning - CL stress test was nl     Edema 09/27/2009   Qualifier: Diagnosis of  By: [name],  MD, [name]    ESOPHAGEAL STRICTURE 03/06/2007   Qualifier: Diagnosis of  By: [name] MD, [name]    Esophagitis 2006   Essential hypertension 03/08/2007   Chronic  On Benicar    FOOT PAIN 07/25/2009   R foot OA     GERD 02/02/2007   Wedge pillow Protonix prn, Pepcid - d/c   History of pneumonia 12/2016   History of shingles    Hyperlipidemia    IBS 03/06/2007   Qualifier: Diagnosis of  By: [name] MD, [name]    Impaired glucose tolerance 11/22/2010   Kidney cysts    left- Dr [name]   Lung nodule 05/08/2011   04/15/11 RLL 4 mm nodule on abd CT - [facility] office    Obesity 01/03/2015   Osteoporosis 02/02/2007   Chronic  Vit D, yoga   Palpitations 05/12/2014   Dr [name] 2/16 poss due to steroid pack    Rash and nonspecific skin eruption 09/12/2015   5/17?urticaria Pt is allergic to seafood - the pt is on MegaRed krill oil - d/c MegaRed    Upper airway cough syndrome 12/08/2014   Dr [name] Flutter valve added 01/02/2015 > resolved 02/03/2015  - rec try off gerd rx p 03/09/15     Past Surgical History:  Procedure Laterality Date  APPENDECTOMY     CHOLECYSTECTOMY     HYSTEROSCOPY WITH RESECTOSCOPE  7/99   resect polyp   MOHS SURGERY  05/2018   face   TUBAL LIGATION Bilateral     Current Outpatient Medications  Medication Sig Dispense Refill   Apple Cider Vinegar (APPLE CIDER VINEGAR ULTRA) 600 MG CAPS Take by mouth.     aspirin 81 MG tablet Take 81 mg by mouth daily.     cholecalciferol (VITAMIN D) 1000 UNITS tablet Take 1,000 Units by mouth daily.     cyanocobalamin 1000 MCG tablet Take 1,000 mcg by mouth daily.     fluticasone (FLONASE) 50 MCG/ACT nasal spray USE 2 SPRAYS IN EACH NOSTRIL AS NEEDED FOR RHINITIS 48 g 3   furosemide (LASIX) 20 MG tablet TAKE 1 TABLET(20 MG) BY MOUTH DAILY AS NEEDED 90 tablet 1   Magnesium 400 MG CAPS Take by mouth daily.     nitroGLYCERIN (NITROSTAT) 0.4 MG SL tablet Place 1 tablet (0.4 mg total) under the tongue every 5 (five) minutes as needed  for chest pain. 25 tablet 3   Probiotic Product (ALIGN) 4 MG CAPS Take 1 capsule (4 mg total) by mouth daily. 30 capsule 0   Respiratory Therapy Supplies (FLUTTER) DEVI Use as directed 1 each 0   albuterol (VENTOLIN HFA) 108 (90 Base) MCG/ACT inhaler Inhale 1 puff into the lungs every 6 (six) hours as needed. (Patient not taking: Reported on 10/17/2021) 8 g 3   No current facility-administered medications for this visit.    Family History  Problem Relation Age of Onset   Asthma Mother    Diabetes Father    Cancer Father        liver   Cancer Sister        breast   Diabetes Sister    Asthma Sister    Breast cancer Sister    Diabetes Brother    Asthma Brother    Asthma Sister    Diabetes Brother    Coronary artery disease Other        1 st degree female relative   Thyroid disease Maternal Uncle     Review of Systems  Exam:   BP 117/75   Pulse (!) 102   Ht '4\' 10"'$  (1.473 m)   Wt 166 lb (75.3 kg)   LMP 04/08/1996   BMI 34.69 kg/m   Height: '4\' 10"'$  (147.3 cm)  General appearance: alert, cooperative and appears stated age Breasts: normal appearance, no masses or tenderness Abdomen: soft, non-tender; bowel sounds normal; no masses,  no organomegaly Lymph nodes: Cervical, supraclavicular, and axillary nodes normal.  No abnormal inguinal nodes palpated Neurologic: Grossly normal  Pelvic: External genitalia:  no lesions              Urethra:  normal appearing urethra with no masses, tenderness or lesions              Bartholins and Skenes: normal                 Vagina: normal appearing vagina with atrophic changes and no discharge, no lesions              Cervix: no lesions              Pap taken: No. Bimanual Exam:  Uterus:  normal size, contour, position, consistency, mobility, non-tender              Adnexa: normal adnexa and no mass, fullness, tenderness  Rectovaginal: Confirms               Anus:  normal sphincter tone, no lesions  Chaperone, Octaviano Batty,  CMA, was present for exam.  Assessment/Plan: 1. Encntr for gyn exam (general) (routine) w/o abn findings - pap smear not indicated - MMG 01/2020.  Order placed for pt to do this year.  She would treate a breast cancer, if diagnosed, so fee it is reasonable to continue mammograms - Colonoscopy 05/29/2010.  No follow up recommended for routine screening - vaccines reivewed/updated - last BMD was 8/16.  Order placed for new BMD.  2. Breast cancer screening by mammogram - MM 3D SCREEN BREAST BILATERAL; Future  3. Hypoestrogenism - DG BONE DENSITY (DXA); Future  4. History of DVT (deep vein thrombosis)

## 2021-10-20 ENCOUNTER — Encounter (HOSPITAL_BASED_OUTPATIENT_CLINIC_OR_DEPARTMENT_OTHER): Payer: Self-pay | Admitting: Obstetrics & Gynecology

## 2021-12-12 ENCOUNTER — Ambulatory Visit: Payer: PPO | Admitting: Internal Medicine

## 2021-12-19 ENCOUNTER — Encounter: Payer: Self-pay | Admitting: Internal Medicine

## 2021-12-19 ENCOUNTER — Ambulatory Visit (INDEPENDENT_AMBULATORY_CARE_PROVIDER_SITE_OTHER): Payer: PPO | Admitting: Internal Medicine

## 2021-12-19 VITALS — BP 122/70 | HR 85 | Temp 98.2°F | Ht <= 58 in | Wt 164.6 lb

## 2021-12-19 DIAGNOSIS — R197 Diarrhea, unspecified: Secondary | ICD-10-CM

## 2021-12-19 DIAGNOSIS — R609 Edema, unspecified: Secondary | ICD-10-CM

## 2021-12-19 DIAGNOSIS — Z23 Encounter for immunization: Secondary | ICD-10-CM | POA: Diagnosis not present

## 2021-12-19 DIAGNOSIS — I2510 Atherosclerotic heart disease of native coronary artery without angina pectoris: Secondary | ICD-10-CM | POA: Diagnosis not present

## 2021-12-19 DIAGNOSIS — I10 Essential (primary) hypertension: Secondary | ICD-10-CM

## 2021-12-19 LAB — CBC WITH DIFFERENTIAL/PLATELET
Basophils Absolute: 0 10*3/uL (ref 0.0–0.1)
Basophils Relative: 0.5 % (ref 0.0–3.0)
Eosinophils Absolute: 0.1 10*3/uL (ref 0.0–0.7)
Eosinophils Relative: 2.5 % (ref 0.0–5.0)
HCT: 37.5 % (ref 36.0–46.0)
Hemoglobin: 12.3 g/dL (ref 12.0–15.0)
Lymphocytes Relative: 32.4 % (ref 12.0–46.0)
Lymphs Abs: 1.5 10*3/uL (ref 0.7–4.0)
MCHC: 32.8 g/dL (ref 30.0–36.0)
MCV: 86.3 fl (ref 78.0–100.0)
Monocytes Absolute: 0.4 10*3/uL (ref 0.1–1.0)
Monocytes Relative: 8.8 % (ref 3.0–12.0)
Neutro Abs: 2.5 10*3/uL (ref 1.4–7.7)
Neutrophils Relative %: 55.8 % (ref 43.0–77.0)
Platelets: 175 10*3/uL (ref 150.0–400.0)
RBC: 4.34 Mil/uL (ref 3.87–5.11)
RDW: 15.2 % (ref 11.5–15.5)
WBC: 4.5 10*3/uL (ref 4.0–10.5)

## 2021-12-19 LAB — COMPREHENSIVE METABOLIC PANEL
ALT: 15 U/L (ref 0–35)
AST: 19 U/L (ref 0–37)
Albumin: 3.7 g/dL (ref 3.5–5.2)
Alkaline Phosphatase: 87 U/L (ref 39–117)
BUN: 18 mg/dL (ref 6–23)
CO2: 27 mEq/L (ref 19–32)
Calcium: 9.5 mg/dL (ref 8.4–10.5)
Chloride: 104 mEq/L (ref 96–112)
Creatinine, Ser: 0.72 mg/dL (ref 0.40–1.20)
GFR: 74.45 mL/min (ref >60.00)
Glucose, Bld: 100 mg/dL — ABNORMAL HIGH (ref 70–99)
Potassium: 4.1 mEq/L (ref 3.5–5.1)
Sodium: 140 mEq/L (ref 135–145)
Total Bilirubin: 0.5 mg/dL (ref 0.2–1.2)
Total Protein: 7.2 g/dL (ref 6.0–8.3)

## 2021-12-19 LAB — HEMOGLOBIN A1C: Hgb A1c MFr Bld: 6.2 % (ref 4.6–6.5)

## 2021-12-19 LAB — URINALYSIS, ROUTINE W REFLEX MICROSCOPIC
Bilirubin Urine: NEGATIVE
Hgb urine dipstick: NEGATIVE
Ketones, ur: NEGATIVE
Nitrite: NEGATIVE
Specific Gravity, Urine: >=1.03 — AB (ref 1.000–1.030)
Total Protein, Urine: NEGATIVE
Urine Glucose: NEGATIVE
Urobilinogen, UA: 0.2 (ref 0.0–1.0)
pH: 5.5 (ref 5.0–8.0)

## 2021-12-19 LAB — LIPID PANEL
Cholesterol: 205 mg/dL — ABNORMAL HIGH (ref 0–200)
HDL: 43 mg/dL (ref >39.00)
LDL Cholesterol: 137 mg/dL — ABNORMAL HIGH (ref 0–99)
NonHDL: 161.51
Total CHOL/HDL Ratio: 5
Triglycerides: 122 mg/dL (ref 0.0–149.0)
VLDL: 24.4 mg/dL (ref 0.0–40.0)

## 2021-12-19 LAB — TSH: TSH: 0.85 u[IU]/mL (ref 0.35–5.50)

## 2021-12-19 MED ORDER — FUROSEMIDE 20 MG PO TABS
ORAL_TABLET | ORAL | 1 refills | Status: DC
Start: 2021-12-19 — End: 2022-10-16

## 2021-12-19 MED ORDER — NITROGLYCERIN 0.4 MG SL SUBL: 0.4000 mg | SUBLINGUAL_TABLET | SUBLINGUAL | 3 refills | Status: DC | PRN

## 2021-12-19 NOTE — Assessment & Plan Note (Signed)
2V CAD on CT 2018 ASA Statin intolerant

## 2021-12-19 NOTE — Assessment & Plan Note (Signed)
IBS-D Imodium prn

## 2021-12-19 NOTE — Assessment & Plan Note (Signed)
Toprol XL low dose

## 2021-12-19 NOTE — Patient Instructions (Signed)
Blue-Emu cream -- use 2-3 times a day ? ?

## 2021-12-19 NOTE — Addendum Note (Signed)
Addended by: Jacobo Forest on: 12/19/2021 10:46 AM   Modules accepted: Orders

## 2021-12-19 NOTE — Progress Notes (Signed)
Subjective:  Patient ID: Diana Stout, female    DOB: July 09, 1930  Age: 88 y.o. MRN: [id number]  CC: Follow-up (6 MONTH F/U)   HPI Diana Stout presents for OA, LBP, IBS-D  Planning to move to [place] soon  Outpatient Medications Prior to Visit  Medication Sig Dispense Refill   albuterol (VENTOLIN HFA) 108 (90 Base) MCG/ACT inhaler Inhale 1 puff into the lungs every 6 (six) hours as needed. 8 g 3   Apple Cider Vinegar (APPLE CIDER VINEGAR ULTRA) 600 MG CAPS Take by mouth.     aspirin 81 MG tablet Take 81 mg by mouth daily.     cholecalciferol (VITAMIN D) 1000 UNITS tablet Take 1,000 Units by mouth daily.     cyanocobalamin 1000 MCG tablet Take 1,000 mcg by mouth daily.     fluticasone (FLONASE) 50 MCG/ACT nasal spray USE 2 SPRAYS IN EACH NOSTRIL AS NEEDED FOR RHINITIS 48 g 3   Magnesium 400 MG CAPS Take by mouth daily.     potassium chloride (KLOR-CON M) 10 MEQ tablet TAKE ONCE A DAY WITH FUROSEMIDE AS NEEDED     Probiotic Product (ALIGN) 4 MG CAPS Take 1 capsule (4 mg total) by mouth daily. 30 capsule 0   Respiratory Therapy Supplies (FLUTTER) DEVI Use as directed 1 each 0   furosemide (LASIX) 20 MG tablet TAKE 1 TABLET(20 MG) BY MOUTH DAILY AS NEEDED 90 tablet 1   nitroGLYCERIN (NITROSTAT) 0.4 MG SL tablet Place 1 tablet (0.4 mg total) under the tongue every 5 (five) minutes as needed for chest pain. 25 tablet 3   No facility-administered medications prior to visit.    ROS: Review of Systems  Constitutional:  Negative for activity change, appetite change, chills, fatigue and unexpected weight change.  HENT:  Negative for congestion, mouth sores and sinus pressure.   Eyes:  Negative for visual disturbance.  Respiratory:  Negative for cough and chest tightness.   Gastrointestinal:  Negative for abdominal pain and nausea.  Genitourinary:  Negative for difficulty urinating, frequency and vaginal pain.  Musculoskeletal:  Negative for back pain and gait problem.  Skin:   Negative for pallor and rash.  Neurological:  Negative for dizziness, tremors, weakness, numbness and headaches.  Psychiatric/Behavioral:  Negative for confusion and sleep disturbance.     Objective:  BP 122/70 (BP Location: Left Arm)   Pulse 85   Temp 98.2 F (36.8 C) (Oral)   Ht '4\' 10"'$  (1.473 m)   Wt 164 lb 9.6 oz (74.7 kg)   LMP 04/08/1996   SpO2 96%   BMI 34.40 kg/m   BP Readings from Last 3 Encounters:  12/19/21 122/70  10/17/21 117/75  09/12/21 132/72    Wt Readings from Last 3 Encounters:  12/19/21 164 lb 9.6 oz (74.7 kg)  10/17/21 166 lb (75.3 kg)  09/12/21 169 lb 9.6 oz (76.9 kg)    Physical Exam Constitutional:      General: She is not in acute distress.    Appearance: She is well-developed. She is obese.  HENT:     Head: Normocephalic.     Right Ear: External ear normal.     Left Ear: External ear normal.     Nose: Nose normal.  Eyes:     General:        Right eye: No discharge.        Left eye: No discharge.     Conjunctiva/sclera: Conjunctivae normal.     Pupils: Pupils are equal, round, and  reactive to light.  Neck:     Thyroid: No thyromegaly.     Vascular: No JVD.     Trachea: No tracheal deviation.  Cardiovascular:     Rate and Rhythm: Normal rate and regular rhythm.     Heart sounds: Normal heart sounds.  Pulmonary:     Effort: No respiratory distress.     Breath sounds: No stridor. No wheezing.  Abdominal:     General: Bowel sounds are normal. There is no distension.     Palpations: Abdomen is soft. There is no mass.     Tenderness: There is no abdominal tenderness. There is no guarding or rebound.  Genitourinary:    General: Normal vulva.  Musculoskeletal:        General: No tenderness.     Cervical back: Normal range of motion and neck supple. No rigidity.  Lymphadenopathy:     Cervical: No cervical adenopathy.  Skin:    Findings: No erythema or rash.  Neurological:     Cranial Nerves: No cranial nerve deficit.     Motor: No  abnormal muscle tone.     Coordination: Coordination normal.     Deep Tendon Reflexes: Reflexes normal.  Psychiatric:        Behavior: Behavior normal.        Thought Content: Thought content normal.        Judgment: Judgment normal.     Lab Results  Component Value Date   WBC 4.9 11/29/2020   HGB 12.4 11/29/2020   HCT 38.2 11/29/2020   PLT 200.0 11/29/2020   GLUCOSE 104 (H) 06/06/2021   CHOL 206 (H) 11/29/2020   TRIG 102.0 11/29/2020   HDL 47.00 11/29/2020   LDLDIRECT 154.9 05/01/2011   LDLCALC 139 (H) 11/29/2020   ALT 18 06/06/2021   AST 26 06/06/2021   NA 140 06/06/2021   K 5.3 (H) 06/06/2021   CL 103 06/06/2021   CREATININE 0.79 06/06/2021   BUN 17 06/06/2021   CO2 31 06/06/2021   TSH 0.95 11/29/2020   HGBA1C 6.3 06/06/2021    VAS US CAROTID  Result Date: 01/03/2021 Carotid Arterial Duplex Study Patient Name:  Diana Stout  Date of Exam:   01/03/2021 Medical Rec #: [id number]        Accession #:    [id number] Date of Birth: 1931/01/20       Patient Gender: F Patient Age:   3 years Exam Location:  [facility] Procedure:      VAS US CAROTID Referring Phys: Marzetta Board BURNS --------------------------------------------------------------------------------  Indications:       CVA and patient reports 3 days of dizziness with on-and-off                    balance. She denies any other cerebrovascular symptoms. Acute                    CVA noted on recent MRI on 12/22/2020. Risk Factors:      Hypertension, hyperlipidemia, no history of smoking, coronary                    artery disease, prior CVA. Comparison Study:  NA Performing Technologist: Sharlett Iles RVT  Examination Guidelines: A complete evaluation includes B-mode imaging, spectral Doppler, color Doppler, and power Doppler as needed of all accessible portions of each vessel. Bilateral testing is considered an integral part of a complete examination. Limited examinations for reoccurring indications may be performed as noted.  Right Carotid Findings: +----------+--------+--------+--------+------------------+----------+           PSV cm/sEDV cm/sStenosisPlaque DescriptionComments   +----------+--------+--------+--------+------------------+----------+ CCA Prox  77      13                                tortuous   +----------+--------+--------+--------+------------------+----------+ CCA Distal105     23                                           +----------+--------+--------+--------+------------------+----------+ ICA Prox  104     17      Normal                               +----------+--------+--------+--------+------------------+----------+ ICA Mid   60      18                                steep dive +----------+--------+--------+--------+------------------+----------+ ICA Distal86      23                                tortuous   +----------+--------+--------+--------+------------------+----------+ ECA       76      6                                            +----------+--------+--------+--------+------------------+----------+ +----------+--------+-------+----------------+-------------------+           PSV cm/sEDV cmsDescribe        Arm Pressure (mmHG) +----------+--------+-------+----------------+-------------------+ Subclavian140            Multiphasic, WNL130                 +----------+--------+-------+----------------+-------------------+ +---------+--------+--+--------+--+---------+ VertebralPSV cm/s46EDV cm/s10Antegrade +---------+--------+--+--------+--+---------+  Left Carotid Findings: +----------+--------+--------+--------+------------------+--------+           PSV cm/sEDV cm/sStenosisPlaque DescriptionComments +----------+--------+--------+--------+------------------+--------+ CCA Prox  80      11                                tortuous +----------+--------+--------+--------+------------------+--------+ CCA Distal84      18                                          +----------+--------+--------+--------+------------------+--------+ ICA Prox  71      16      Normal                             +----------+--------+--------+--------+------------------+--------+ ICA Mid   149     35                                tortuous +----------+--------+--------+--------+------------------+--------+ ICA Distal128     34  tortuous +----------+--------+--------+--------+------------------+--------+ ECA       88      15                                         +----------+--------+--------+--------+------------------+--------+ +----------+--------+--------+----------------+-------------------+           PSV cm/sEDV cm/sDescribe        Arm Pressure (mmHG) +----------+--------+--------+----------------+-------------------+ [id number]             Multiphasic, WVP710                 +----------+--------+--------+----------------+-------------------+ +---------+--------+--+--------+--+---------+ VertebralPSV cm/s53EDV cm/s12Antegrade +---------+--------+--+--------+--+---------+   Summary: Right Carotid: There is no evidence of stenosis in the right ICA. There was no                evidence of thrombus, dissection, atherosclerotic plaque or                stenosis in the cervical carotid system. Left Carotid: There is no evidence of stenosis in the left ICA. There was no               evidence of thrombus, dissection, atherosclerotic plaque or               stenosis in the cervical carotid system. Vertebrals:  Bilateral vertebral arteries demonstrate antegrade flow. Subclavians: Normal flow hemodynamics were seen in bilateral subclavian              arteries. *See table(s) above for measurements and observations.  Electronically signed by Ida Rogue MD on 01/03/2021 at 3:58:29 PM.    Final     Assessment & Plan:   Problem List Items Addressed This Visit     CAD (coronary artery  disease)    2V CAD on CT 2018 ASA Statin intolerant       Relevant Medications   nitroGLYCERIN (NITROSTAT) 0.4 MG SL tablet   furosemide (LASIX) 20 MG tablet   Other Relevant Orders   TSH   Urinalysis   CBC with Differential/Platelet   Lipid panel   Comprehensive metabolic panel   Hemoglobin A1c   QuantiFERON-TB Gold Plus   Diarrhea    IBS-D Imodium prn      Relevant Orders   QuantiFERON-TB Gold Plus   Edema    Cont on Lasix, Kdur prn      Relevant Orders   TSH   Urinalysis   CBC with Differential/Platelet   Lipid panel   Comprehensive metabolic panel   Hemoglobin A1c   QuantiFERON-TB Gold Plus   Essential hypertension    Toprol XL low dose      Relevant Medications   nitroGLYCERIN (NITROSTAT) 0.4 MG SL tablet   furosemide (LASIX) 20 MG tablet   Other Relevant Orders   TSH   Urinalysis   CBC with Differential/Platelet   Lipid panel   Comprehensive metabolic panel   Hemoglobin A1c   QuantiFERON-TB Gold Plus      Meds ordered this encounter  Medications   nitroGLYCERIN (NITROSTAT) 0.4 MG SL tablet    Sig: Place 1 tablet (0.4 mg total) under the tongue every 5 (five) minutes as needed for chest pain.    Dispense:  25 tablet    Refill:  3   furosemide (LASIX) 20 MG tablet    Sig: TAKE 1 TABLET(20 MG) BY MOUTH DAILY AS NEEDED    Dispense:  90 tablet  Refill:  1      Follow-up: Return in about 6 months (around 06/19/2022) for a follow-up visit.  Walker Kehr, MD

## 2021-12-19 NOTE — Addendum Note (Signed)
Addended by: Earnstine Regal on: 12/19/2021 10:36 AM   Modules accepted: Orders

## 2021-12-19 NOTE — Assessment & Plan Note (Signed)
Cont on Lasix, Kdur prn

## 2021-12-21 ENCOUNTER — Other Ambulatory Visit: Payer: Self-pay | Admitting: Internal Medicine

## 2021-12-21 MED ORDER — NITROFURANTOIN MONOHYD MACRO 100 MG PO CAPS: 100.0000 mg | ORAL_CAPSULE | Freq: Two times a day (BID) | ORAL | 1 refills | Status: AC

## 2021-12-23 LAB — QUANTIFERON-TB GOLD PLUS
Mitogen-NIL: >10 IU/mL
NIL: 0.08 IU/mL
QuantiFERON-TB Gold Plus: NEGATIVE
TB1-NIL: 0.02 IU/mL
TB2-NIL: 0.04 IU/mL

## 2021-12-24 NOTE — Progress Notes (Signed)
CARDIOLOGY CONSULT NOTE      Patient ID: Diana Stout MRN: [id number] DOB/AGE: 1931/03/15 88 y.o.  Primary Physician: [name], MD Primary Cardiologist: [name]   HPI:  88 y.o. referred by Dr [name] 01/06/19  for chest pain and palpitations. 9/20 had pounding heart with chest tightness for 2.5 hours. BP was elevated some dizziness. Resolved and felt fine. Had been off BP meds for a year Toprol restarted on 12/29/18 by primary ECG reviewed from 9/22 normal sinus normal ST/T waves rate 86 bpm  Previous w/u with normal myovue March 2017 EF 68% Has had normal echo's in 2010 and 2016 Some bronchiectasis  Intolerant to lipitor and Zocor in past  She has not had recurrence of palpitations or chest pain She has gotten out of shape during COVID Some exertional dyspnea   Monitor reviewed from 01/28/19  Average HR 80 PAC;s short bursts <8 beats atrial tachycardia  Daughter who had ENT cancer doing well and has masters in deaf education   12/22/20 MRI with lacunar/pontine stroke ? Related to balance issues Seen by Dr Quentin Ore 02/13/21 and no ILR recommended 14 day Zio with no PAF TTE only mild LAE normal EF and no significant valve disease Carotids 01/03/21 with no significant dx  No cardiac issues Chronic mild dependant edema she uses lasix for Still with poor balance She may start going to [facility]. Suggested she get primary to refer her to PT/OT  ROS All other systems reviewed and negative except as noted above  Past Medical History:  Diagnosis Date   Abdominal pain, lower 11/22/2010   2015 resolved off Zocor 5/17?urticaria w/GI upset: Dtr is allergic to seafood - the pt is on MegaRed krill oil - d/c MegaRed    Abnormal chest CT 05/22/2011   Followed in Pulmonary clinic/ [facility]/ [place]  05/14/11 CT CHEST WITHOUT CONTRAST 1. Multiple small pulmonary nodules scattered throughout the lungs  bilaterally ranging in size from 3-6 mm.    2. There is a small hiatal hernia.  3.  Atherosclerosis.  4. Status post cholecystectomy.  5. Low attenuation hepatic lesions unchanged compared to recent  abdominal CT scan, likely to represent cysts   ANEMIA, NORMOCYTIC 08/10/2009   mild     DIVERTICULOSIS, COLON 03/08/2007   Qualifier: Diagnosis of  By: [name] MD, [name]    Dizziness 07/19/2013   Likely due to diarrhea/meds 4/15    DVT (deep venous thrombosis) ([place] [facility]) 1998   hx of right   Dyslipidemia 02/02/2007   Chronic 1/13 worse     Dyspnea 07/01/2008   2/13 improved w/reg exercise 1/17 deconditioning - CL stress test was nl     Edema 09/27/2009   Qualifier: Diagnosis of  By: [name], MD, [name]    ESOPHAGEAL STRICTURE 03/06/2007   Qualifier: Diagnosis of  By: [name] MD, [name]    Esophagitis 2006   Essential hypertension 03/08/2007   Chronic  On Benicar    FOOT PAIN 07/25/2009   R foot OA     GERD 02/02/2007   Wedge pillow Protonix prn, Pepcid - d/c   History of pneumonia 12/2016   History of shingles    Hyperlipidemia    IBS 03/06/2007   Qualifier: Diagnosis of  By: [name] MD, [name]    Impaired glucose tolerance 11/22/2010   Kidney cysts    left- Dr [name]   Lung nodule 05/08/2011   04/15/11 RLL 4 mm nodule on abd CT - [facility] office  Obesity 01/03/2015   Osteoporosis 02/02/2007   Chronic  Vit D, yoga   Palpitations 05/12/2014   Dr [name] 2/16 poss due to steroid pack    Rash and nonspecific skin eruption 09/12/2015   5/17?urticaria Pt is allergic to seafood - the pt is on MegaRed krill oil - d/c MegaRed    Upper airway cough syndrome 12/08/2014   Dr [name] Flutter valve added 01/02/2015 > resolved 02/03/2015  - rec try off gerd rx p 03/09/15     Family History  Problem Relation Age of Onset   Asthma Mother    Diabetes Father    Cancer Father        liver   Diabetes Sister    Asthma Sister    Breast cancer Sister    Asthma Sister    Diabetes Brother    Asthma Brother    Diabetes Brother    Thyroid disease Maternal Uncle    Coronary artery disease  Other        1 st degree female relative    Social History   Socioeconomic History   Marital status: Widowed    Spouse name: Not on file   Number of children: 3   Years of education: Not on file   Highest education level: Not on file  Occupational History   Occupation: Retired    Fish farm manager: RETIRED  Tobacco Use   Smoking status: Never   Smokeless tobacco: Never  Vaping Use   Vaping Use: Never used  Substance and Sexual Activity   Alcohol use: No   Drug use: No   Sexual activity: Not Currently    Birth control/protection: Post-menopausal  Other Topics Concern   Not on file  Social History Narrative   Not on file   Social Determinants of Health   Financial Resource Strain: Not on file  Food Insecurity: Not on file  Transportation Needs: Not on file  Physical Activity: Not on file  Stress: Not on file  Social Connections: Not on file  Intimate Partner Violence: Not on file    Past Surgical History:  Procedure Laterality Date   APPENDECTOMY     CHOLECYSTECTOMY     HYSTEROSCOPY WITH RESECTOSCOPE  7/99   resect polyp   MOHS SURGERY  05/2018   face   TUBAL LIGATION Bilateral         Physical Exam: Blood pressure 134/78, pulse 73, height '4\' 11"'$  (1.499 m), weight 169 lb 6.4 oz (76.8 kg), last menstrual period 04/08/1996, SpO2 95 %.   Affect appropriate Healthy:  appears stated age 40: normal Neck supple with no adenopathy JVP normal no bruits no thyromegaly Lungs clear with no wheezing and good diaphragmatic motion Heart:  S1/S2 no murmur, no rub, gallop or click PMI normal Abdomen: benighn, BS positve, no tenderness, no AAA no bruit.  No HSM or HJR Distal pulses intact with no bruits No edema Neuro non-focal Skin warm and dry No muscular weakness   Labs:   Lab Results  Component Value Date   WBC 4.5 12/19/2021   HGB 12.3 12/19/2021   HCT 37.5 12/19/2021   MCV 86.3 12/19/2021   PLT 175.0 12/19/2021    No results for input(s): "NA", "K", "CL",  "CO2", "BUN", "CREATININE", "CALCIUM", "PROT", "BILITOT", "ALKPHOS", "ALT", "AST", "GLUCOSE" in the last 168 hours.  Invalid input(s): "LABALBU"  Lab Results  Component Value Date   CKTOTAL 272 (H) 08/02/2009   CKMB 1.5 06/22/2014   TROPONINI 0.01  NO INDICATION OF MYOCARDIAL INJURY. 08/02/2009    Lab Results  Component Value Date   CHOL 205 (H) 12/19/2021   CHOL 206 (H) 11/29/2020   CHOL 205 (H) 08/26/2019   Lab Results  Component Value Date   HDL 43.00 12/19/2021   HDL 47.00 11/29/2020   HDL 44.90 08/26/2019   Lab Results  Component Value Date   LDLCALC 137 (H) 12/19/2021   LDLCALC 139 (H) 11/29/2020   LDLCALC 139 (H) 08/26/2019   Lab Results  Component Value Date   TRIG 122.0 12/19/2021   TRIG 102.0 11/29/2020   TRIG 107.0 08/26/2019   Lab Results  Component Value Date   CHOLHDL 5 12/19/2021   CHOLHDL 4 11/29/2020   CHOLHDL 5 08/26/2019   Lab Results  Component Value Date   LDLDIRECT 154.9 05/01/2011      Radiology: No results found.  EKG: 12/29/18 NSR normal ECG  01/04/20 SR rate 80 LAD low voltage 01/07/2022 SR rate 91 normal  01/07/2022 SR LAD normal    ASSESSMENT AND PLAN:   1.  Palpitations/Silent Stroke:  Likely self limited episode of PSVT Continue Toprol Monitor reviewed and no SVT/PAF or serious arrhythmia Recent diagnosis of silent stroke Pattern on MRI seems lacunar / ponttine and not embolic  Dr Quentin Ore did not think ILR needed   2. Chest pain:  In setting of palpitations Normal ECG and previously normal myovue x 3 most recent March 2017 no indication for stress testing currently   3. HTN:  Per primary off BP meds 10/13/19 BP office visit 112/68 mmHg 10/13/19  4. Pulmonary:  Bronchiectasis no recent pneumonia Flu shot f/u pulmonary   F/U in a year new nitro called in    Time:  Spent reviewing chart direct patient tele visit and composing note 20 minutes    Signed: Jenkins Rouge 01/07/2022, 9:40 AM

## 2022-01-07 ENCOUNTER — Ambulatory Visit: Payer: PPO | Attending: Cardiovascular Disease | Admitting: Cardiovascular Disease

## 2022-01-07 ENCOUNTER — Encounter: Payer: Self-pay | Admitting: Cardiovascular Disease

## 2022-01-07 VITALS — BP 134/78 | HR 73 | Ht 59.0 in | Wt 169.4 lb

## 2022-01-07 DIAGNOSIS — Z8673 Personal history of transient ischemic attack (TIA), and cerebral infarction without residual deficits: Secondary | ICD-10-CM | POA: Diagnosis not present

## 2022-01-07 DIAGNOSIS — R609 Edema, unspecified: Secondary | ICD-10-CM | POA: Diagnosis not present

## 2022-01-07 DIAGNOSIS — I10 Essential (primary) hypertension: Secondary | ICD-10-CM

## 2022-01-07 DIAGNOSIS — J479 Bronchiectasis, uncomplicated: Secondary | ICD-10-CM

## 2022-01-07 DIAGNOSIS — R002 Palpitations: Secondary | ICD-10-CM

## 2022-01-07 NOTE — Patient Instructions (Signed)
Medication Instructions:  Your physician recommends that you continue on your current medications as directed. Please refer to the Current Medication list given to you today.  *If you need a refill on your cardiac medications before your next appointment, please call your pharmacy*  Lab Work: If you have labs (blood work) drawn today and your tests are completely normal, you will receive your results only by: MyChart Message (if you have MyChart) OR A paper copy in the mail If you have any lab test that is abnormal or we need to change your treatment, we will call you to review the results.  Testing/Procedures: None ordered today.  Follow-Up: At [facility], you and your health needs are our priority.  As part of our continuing mission to provide you with exceptional heart care, we have created designated Provider Care Teams.  These Care Teams include your primary Cardiologist (physician) and Advanced Practice Providers (APPs -  Physician Assistants and Nurse Practitioners) who all work together to provide you with the care you need, when you need it.  We recommend signing up for the patient portal called "MyChart".  Sign up information is provided on this After Visit Summary.  MyChart is used to connect with patients for Virtual Visits (Telemedicine).  Patients are able to view lab/test results, encounter notes, upcoming appointments, etc.  Non-urgent messages can be sent to your provider as well.   To learn more about what you can do with MyChart, go to [email or web address].    Your next appointment:   1 year(s)  The format for your next appointment:   In Person  Provider:   Peter Nishan, MD     Important Information About Sugar       

## 2022-01-11 ENCOUNTER — Telehealth: Payer: Self-pay

## 2022-01-11 DIAGNOSIS — R269 Unspecified abnormalities of gait and mobility: Secondary | ICD-10-CM

## 2022-01-11 DIAGNOSIS — M545 Low back pain, unspecified: Secondary | ICD-10-CM

## 2022-01-11 NOTE — Telephone Encounter (Addendum)
Patient is calling in asking for a referral to [facility], Dr.Sagwell. Patient would like a call once placed.

## 2022-01-14 NOTE — Addendum Note (Signed)
Addended by: [name] on: 01/14/2022 07:31 AM   Modules accepted: Orders

## 2022-01-14 NOTE — Telephone Encounter (Signed)
Notified pt w/MD response.../lmb 

## 2022-01-14 NOTE — Telephone Encounter (Signed)
Done. Thanks.

## 2022-01-31 ENCOUNTER — Ambulatory Visit (HOSPITAL_BASED_OUTPATIENT_CLINIC_OR_DEPARTMENT_OTHER)
Admission: RE | Admit: 2022-01-31 | Discharge: 2022-01-31 | Disposition: A | Discharge status: Home / Self Care | Payer: PPO | Source: Ambulatory Visit | Attending: Obstetrics & Gynecology | Admitting: Obstetrics & Gynecology

## 2022-01-31 DIAGNOSIS — Z1231 Encounter for screening mammogram for malignant neoplasm of breast: Secondary | ICD-10-CM | POA: Diagnosis not present

## 2022-02-06 ENCOUNTER — Other Ambulatory Visit: Payer: Self-pay | Admitting: Obstetrics & Gynecology

## 2022-02-06 DIAGNOSIS — R928 Other abnormal and inconclusive findings on diagnostic imaging of breast: Secondary | ICD-10-CM

## 2022-02-12 ENCOUNTER — Other Ambulatory Visit: Payer: Self-pay | Admitting: Obstetrics & Gynecology

## 2022-02-12 ENCOUNTER — Ambulatory Visit
Admission: RE | Admit: 2022-02-12 | Discharge: 2022-02-12 | Disposition: A | Discharge status: Home / Self Care | Payer: PPO | Source: Ambulatory Visit | Attending: Obstetrics & Gynecology | Admitting: Obstetrics & Gynecology

## 2022-02-12 DIAGNOSIS — R928 Other abnormal and inconclusive findings on diagnostic imaging of breast: Secondary | ICD-10-CM

## 2022-02-12 DIAGNOSIS — R599 Enlarged lymph nodes, unspecified: Secondary | ICD-10-CM

## 2022-02-12 DIAGNOSIS — R590 Localized enlarged lymph nodes: Secondary | ICD-10-CM | POA: Diagnosis not present

## 2022-02-20 ENCOUNTER — Ambulatory Visit (HOSPITAL_BASED_OUTPATIENT_CLINIC_OR_DEPARTMENT_OTHER): Payer: PPO | Admitting: Physical Therapy

## 2022-02-21 DIAGNOSIS — M25551 Pain in right hip: Secondary | ICD-10-CM | POA: Diagnosis not present

## 2022-02-21 DIAGNOSIS — M545 Low back pain, unspecified: Secondary | ICD-10-CM | POA: Diagnosis not present

## 2022-03-12 ENCOUNTER — Ambulatory Visit (HOSPITAL_BASED_OUTPATIENT_CLINIC_OR_DEPARTMENT_OTHER): Payer: PPO | Attending: Internal Medicine | Admitting: Physical Therapy

## 2022-03-12 ENCOUNTER — Encounter (HOSPITAL_BASED_OUTPATIENT_CLINIC_OR_DEPARTMENT_OTHER): Payer: Self-pay | Admitting: Physical Therapy

## 2022-03-12 DIAGNOSIS — G8929 Other chronic pain: Secondary | ICD-10-CM | POA: Diagnosis not present

## 2022-03-12 DIAGNOSIS — R262 Difficulty in walking, not elsewhere classified: Secondary | ICD-10-CM | POA: Insufficient documentation

## 2022-03-12 DIAGNOSIS — M6281 Muscle weakness (generalized): Secondary | ICD-10-CM | POA: Insufficient documentation

## 2022-03-12 DIAGNOSIS — R269 Unspecified abnormalities of gait and mobility: Secondary | ICD-10-CM | POA: Diagnosis not present

## 2022-03-12 DIAGNOSIS — R2681 Unsteadiness on feet: Secondary | ICD-10-CM

## 2022-03-12 DIAGNOSIS — Z9181 History of falling: Secondary | ICD-10-CM | POA: Insufficient documentation

## 2022-03-12 DIAGNOSIS — M545 Low back pain, unspecified: Secondary | ICD-10-CM | POA: Insufficient documentation

## 2022-03-12 NOTE — Therapy (Signed)
OUTPATIENT PHYSICAL THERAPY THORACOLUMBAR EVALUATION   Patient Name: Diana Stout MRN: [id number] DOB:[birth year], 88 y.o., female Today's Date: 03/12/2022  END OF SESSION:  PT End of Session - 03/12/22 1201     Visit Number 1    Number of Visits 13    Date for PT Re-Evaluation 04/23/22    Authorization Type HTA    Authorization Time Period 03/12/22 to 05/07/22    Progress Note Due on Visit 10    PT Start Time 1103    PT Stop Time 1147    PT Time Calculation (min) 44 min    Activity Tolerance Patient tolerated treatment well    Behavior During Therapy [facility] for tasks assessed/performed             Past Medical History:  Diagnosis Date   Abdominal pain, lower 11/22/2010   2015 resolved off Zocor 5/17?urticaria w/GI upset: Dtr is allergic to seafood - the pt is on MegaRed krill oil - d/c MegaRed    Abnormal chest CT 05/22/2011   Followed in Pulmonary clinic/ [facility]/ [facility]  05/14/11 CT CHEST WITHOUT CONTRAST 1. Multiple small pulmonary nodules scattered throughout the lungs  bilaterally ranging in size from 3-6 mm.    2. There is a small hiatal hernia.  3. Atherosclerosis.  4. Status post cholecystectomy.  5. Low attenuation hepatic lesions unchanged compared to recent  abdominal CT scan, likely to represent cysts   ANEMIA, NORMOCYTIC 08/10/2009   mild     DIVERTICULOSIS, COLON 03/08/2007   Qualifier: Diagnosis of  By: [name] MD, [name]    Dizziness 07/19/2013   Likely due to diarrhea/meds 4/15    DVT (deep venous thrombosis) ([place]) 1998   hx of right   Dyslipidemia 02/02/2007   Chronic 1/13 worse     Dyspnea 07/01/2008   2/13 improved w/reg exercise 1/17 deconditioning - CL stress test was nl     Edema 09/27/2009   Qualifier: Diagnosis of  By: [name], MD, [name]    ESOPHAGEAL STRICTURE 03/06/2007   Qualifier: Diagnosis of  By: [name] MD, [name]    Esophagitis 2006   Essential hypertension 03/08/2007   Chronic  On Benicar    FOOT PAIN 07/25/2009   R foot  OA     GERD 02/02/2007   Wedge pillow Protonix prn, Pepcid - d/c   History of pneumonia 12/2016   History of shingles    Hyperlipidemia    IBS 03/06/2007   Qualifier: Diagnosis of  By: [name] MD, [name]    Impaired glucose tolerance 11/22/2010   Kidney cysts    left- Dr [name]   Lung nodule 05/08/2011   04/15/11 RLL 4 mm nodule on abd CT - [facility] office    Obesity 01/03/2015   Osteoporosis 02/02/2007   Chronic  Vit D, yoga   Palpitations 05/12/2014   Dr [name] 2/16 poss due to steroid pack    Rash and nonspecific skin eruption 09/12/2015   5/17?urticaria Pt is allergic to seafood - the pt is on MegaRed krill oil - d/c MegaRed    Upper airway cough syndrome 12/08/2014   Dr [name] Flutter valve added 01/02/2015 > resolved 02/03/2015  - rec try off gerd rx p 03/09/15    Past Surgical History:  Procedure Laterality Date   APPENDECTOMY     CHOLECYSTECTOMY     HYSTEROSCOPY WITH RESECTOSCOPE  7/99   resect polyp   MOHS SURGERY  05/2018   face   TUBAL LIGATION  Bilateral    Patient Active Problem List   Diagnosis Date Noted   Hyperkalemia 06/10/2021   Chronic bronchitis ([place]) 05/30/2021   Lacunar stroke ([place]) 03/08/2021   Prediabetes 01/02/2021   History of CVA (cerebrovascular accident) without residual deficits 01/01/2021   Balance problem 11/29/2020   Acute cystitis with hematuria 01/31/2020   Recurrent UTI 01/31/2020   Low back pain 10/13/2019   CAD (coronary artery disease) 03/04/2018   Lightheadedness 11/05/2017   Canker sore 01/01/2017   Kidney cyst, acquired 07/22/2016   Rash and nonspecific skin eruption 09/12/2015   Obesity 01/03/2015   Upper airway cough syndrome 12/08/2014   Right-sided thoracic back pain 05/12/2014   Palpitations 05/12/2014   Snoring 12/22/2013   Diarrhea 07/19/2013   Dizziness 07/19/2013   Fatigue 10/15/2011   Impaired glucose tolerance 11/22/2010   Well adult exam 11/22/2010   Abdominal pain, lower 11/22/2010   SYNCOPE 09/27/2009   Edema  09/27/2009   Recurrent pneumonia 08/15/2009   ANEMIA, NORMOCYTIC 08/10/2009   SKIN RASH 08/10/2009   FOOT PAIN 07/25/2009   Dyspnea 07/01/2008   Acute bronchitis 06/12/2007   Essential hypertension 03/08/2007   DIVERTICULOSIS, COLON 03/08/2007   ESOPHAGEAL STRICTURE 03/06/2007   IBS 03/06/2007   SHINGLES, HX OF 03/06/2007   Dyslipidemia 02/02/2007   GERD 02/02/2007   Osteoporosis 02/02/2007   DVT, HX OF 02/02/2007    PCP: Plotnikov, [name], MD  REFERRING PROVIDER: [name], MD  REFERRING DIAG:  M54.50,G89.29 (ICD-10-CM) - Chronic right-sided low back pain without sciatica  R26.9 (ICD-10-CM) - Gait disorder    Rationale for Evaluation and Treatment: Rehabilitation  THERAPY DIAG:  Muscle weakness (generalized)  Unsteadiness on feet  History of falling  Difficulty in walking, not elsewhere classified  ONSET DATE: 01/14/2022  SUBJECTIVE:                                                                                                                                                                                           SUBJECTIVE STATEMENT:  I didn't know I was coming here to be seen today, nobody told me what was going on with this. Is this your office? I do have back pain but my balance is a big concern, I had a fall on the 17th at the doctors office with no injuries. I am rather active. Up until the quarantine I was taking yoga at church and everything, now I don't do anything really beyond yard work but not too much. I was 88 last month. Recently I was a little dizzy and my doctor's office did an MRI and found a small stroke behind my left  ear, not sure of acuity.   PERTINENT HISTORY:    PAIN:  Are you having pain? No: NPRS scale: 0/10; back can get to 5-6/10 but only occasionally  Pain location:  Pain description:  Aggravating factors:  Relieving factors:   PRECAUTIONS: Fall, very HOH   WEIGHT BEARING RESTRICTIONS: No  FALLS:  Has patient  fallen in last 6 months? Yes. Number of falls 1 on the 17th; (+) FOF  LIVING ENVIRONMENT: Lives with: lives alone but has a daughter that lives 3 minutes away  Lives in: House/apartment Stairs: 5STE with L ascending rail, no steps inside the home but does have threshold step to get into home  Has following equipment at home: Single point cane  OCCUPATION: retired   PLOF: Independent  PATIENT GOALS: want to walk straight and not keel over   NEXT MD VISIT: referring in 5-6 months   OBJECTIVE:   DIAGNOSTIC FINDINGS:  Per pt had xrays done on the 16th, low back and hips looked good but have some arthritis in the back   PATIENT SURVEYS:  FOTO will do 2nd session  SCREENING FOR RED FLAGS: Bowel or bladder incontinence: Yes: bladder incontinence for 5 years, not associated with back pain  Spinal tumors: No Cauda equina syndrome: No Compression fracture: No Abdominal aneurysm: No  COGNITION: Overall cognitive status: Within functional limits for tasks assessed     SENSATION: WFL Not tested  MUSCLE LENGTH:   POSTURE: rounded shoulders, forward head, decreased lumbar lordosis, and increased thoracic kyphosis  PALPATION:   LUMBAR ROM:   AROM eval  Flexion   Extension   Right lateral flexion   Left lateral flexion   Right rotation   Left rotation    (Blank rows = not tested)  LOWER EXTREMITY ROM:     Active  Right eval Left eval  Hip flexion    Hip extension    Hip abduction    Hip adduction    Hip internal rotation    Hip external rotation    Knee flexion    Knee extension    Ankle dorsiflexion    Ankle plantarflexion    Ankle inversion    Ankle eversion     (Blank rows = not tested)  LOWER EXTREMITY MMT:    MMT Right eval Left eval  Hip flexion 3 3  Hip extension    Hip abduction 3+ 3  Hip adduction    Hip internal rotation    Hip external rotation    Knee flexion 5 4  Knee extension 5 4  Ankle dorsiflexion 5 5  Ankle plantarflexion     Ankle inversion    Ankle eversion     (Blank rows = not tested)  LUMBAR SPECIAL TESTS:    FUNCTIONAL TESTS:  5 times sit to stand: 14 seconds no UEs  3 minute walk test: 440f no rest RPE 4/10 Dynamic Gait Index: 14/24  GAIT: Distance walked: in clinic distances  Assistive device utilized: None Level of assistance: Complete Independence Comments: flexed at hips, short step lengths, stance times, L/R drift increasing with fatigue, + trendelenburg. Gait speed 2.7 ft/sec   TODAY'S TREATMENT:  DATE:   Eval  Objective measures + appropriate education  Bridges with ABD into red TB x5  Standing hip ABD red TB x5 Tandem stance 1x10 seconds       PATIENT EDUCATION:  Education details: exam findings, POC, HEP  Person educated: Patient Education method: Explanation, Demonstration, and Handouts Education comprehension: verbalized understanding, returned demonstration, and needs further education  HOME EXERCISE PROGRAM:  Access Code: [email or web address] URL: [email or web address] Date: 03/12/2022 Prepared by: Deniece Ree  Exercises - Supine Bridge with Resistance Band  - 2 x daily - 7 x weekly - 1 sets - 10 reps - 3 hold - Hip Abduction with Resistance Loop  - 2 x daily - 7 x weekly - 1 sets - 10 reps - 1 hold - Tandem Stance  - 2 x daily - 7 x weekly - 1 sets - 3 reps - 15-30 hold  ASSESSMENT:  CLINICAL IMPRESSION: Patient is a 88 y.o. F who was seen today for physical therapy evaluation and treatment for back pain and gait disorder. She tells me her main concern is her balance as she did have a major fall recently, her back only bothers her occasionally and most of the time she has no concerns with it. As such, focused exam on mobility and balance- exam reveals significant functional muscle weakness, gait deviation, poor functional activity  tolerance, and poor dynamic balance skills. Will benefit from skilled PT services to address limitations, reduce fall risk, and optimize overall level of function moving forward.   OBJECTIVE IMPAIRMENTS: Abnormal gait, decreased activity tolerance, decreased balance, decreased coordination, decreased mobility, difficulty walking, decreased strength, and postural dysfunction.   ACTIVITY LIMITATIONS: standing, stairs, transfers, and locomotion level  PARTICIPATION LIMITATIONS: shopping, community activity, yard work, and church  PERSONAL FACTORS: Fitness, Social background, and Time since onset of injury/illness/exacerbation are also affecting patient's functional outcome.   REHAB POTENTIAL: Good  CLINICAL DECISION MAKING: Stable/uncomplicated  EVALUATION COMPLEXITY: Low   GOALS: Goals reviewed with patient? Yes  SHORT TERM GOALS: Target date: 04/02/2022    Will be compliant with appropriate progressive HEP  Baseline: Goal status: INITIAL  2.  Will be able to name 3 ways to prevent falls at home and in the community  Baseline:  Goal status: INITIAL    LONG TERM GOALS: Target date: 04/23/2022    MMT to improve by 1 grade in all weak groups  Baseline:  Goal status: INITIAL  2.  Will be able to ambulate at least 678f in 3MWT to show improved mobility and community access  Baseline:  Goal status: INITIAL  3.  Will be able to ambulate community distances with no L/R drift and no feelings of unsteadiness Baseline:  Goal status: INITIAL  4.  Will score at least 20 on DGI to show improved functional balance skills  Baseline:  Goal status: INITIAL  5.  Will be able to navigate at least 5 steps with no rail and step over step pattern with minimal unsteadiness to show improved balance and community access  Baseline:  Goal status: INITIAL  6.  Will be compliant with appropriate gym based exercise program to maintain functional gains and prevent recurrence of condition   Baseline:  Goal status: INITIAL  PLAN:  PT FREQUENCY: 2x/week  PT DURATION: 6 weeks  PLANNED INTERVENTIONS: Therapeutic exercises, Therapeutic activity, Neuromuscular re-education, Balance training, Gait training, Patient/Family education, Self Care, Joint mobilization, Stair training, DME instructions, Aquatic Therapy, Spinal mobilization, Cryotherapy, Moist heat, Taping, Ultrasound, Ionotophoresis '4mg'$ /ml Dexamethasone, Manual therapy, and  Re-evaluation.  PLAN FOR NEXT SESSION: balance and strength focus, HEP updates PRN    Ellakate Gonsalves U PT DPT PN2  03/12/2022, 12:02 PM

## 2022-03-20 ENCOUNTER — Ambulatory Visit (HOSPITAL_BASED_OUTPATIENT_CLINIC_OR_DEPARTMENT_OTHER): Payer: PPO | Admitting: Physical Therapy

## 2022-03-26 ENCOUNTER — Ambulatory Visit (HOSPITAL_BASED_OUTPATIENT_CLINIC_OR_DEPARTMENT_OTHER): Payer: PPO | Admitting: Physical Therapy

## 2022-03-26 ENCOUNTER — Encounter (HOSPITAL_BASED_OUTPATIENT_CLINIC_OR_DEPARTMENT_OTHER): Payer: Self-pay | Admitting: Physical Therapy

## 2022-03-26 DIAGNOSIS — G8929 Other chronic pain: Secondary | ICD-10-CM | POA: Diagnosis not present

## 2022-03-26 DIAGNOSIS — M6281 Muscle weakness (generalized): Secondary | ICD-10-CM

## 2022-03-26 DIAGNOSIS — Z9181 History of falling: Secondary | ICD-10-CM

## 2022-03-26 DIAGNOSIS — R2681 Unsteadiness on feet: Secondary | ICD-10-CM

## 2022-03-26 DIAGNOSIS — R262 Difficulty in walking, not elsewhere classified: Secondary | ICD-10-CM

## 2022-03-26 NOTE — Therapy (Signed)
OUTPATIENT PHYSICAL THERAPY THORACOLUMBAR TREATMENT   Patient Name: Diana Stout MRN: [id number] DOB:04-11-1930, 88 y.o., female Today's Date: 03/26/2022  END OF SESSION:  PT End of Session - 03/26/22 1318     Visit Number 2    Number of Visits 13    Date for PT Re-Evaluation 04/23/22    Authorization Type HTA    Authorization Time Period 03/12/22 to 05/07/22    Progress Note Due on Visit 10    PT Start Time 1302    PT Stop Time 1341    PT Time Calculation (min) 39 min    Activity Tolerance Patient tolerated treatment well    Behavior During Therapy [facility] for tasks assessed/performed              Past Medical History:  Diagnosis Date   Abdominal pain, lower 11/22/2010   2015 resolved off Zocor 5/17?urticaria w/GI upset: Dtr is allergic to seafood - the pt is on MegaRed krill oil - d/c MegaRed    Abnormal chest CT 05/22/2011   Followed in Pulmonary clinic/ [facility]/ [facility]  05/14/11 CT CHEST WITHOUT CONTRAST 1. Multiple small pulmonary nodules scattered throughout the lungs  bilaterally ranging in size from 3-6 mm.    2. There is a small hiatal hernia.  3. Atherosclerosis.  4. Status post cholecystectomy.  5. Low attenuation hepatic lesions unchanged compared to recent  abdominal CT scan, likely to represent cysts   ANEMIA, NORMOCYTIC 08/10/2009   mild     DIVERTICULOSIS, COLON 03/08/2007   Qualifier: Diagnosis of  By: [name] MD, [name]    Dizziness 07/19/2013   Likely due to diarrhea/meds 4/15    DVT (deep venous thrombosis) ([place]) 1998   hx of right   Dyslipidemia 02/02/2007   Chronic 1/13 worse     Dyspnea 07/01/2008   2/13 improved w/reg exercise 1/17 deconditioning - CL stress test was nl     Edema 09/27/2009   Qualifier: Diagnosis of  By: [name], MD, [name]    ESOPHAGEAL STRICTURE 03/06/2007   Qualifier: Diagnosis of  By: [name] MD, [name]    Esophagitis 2006   Essential hypertension 03/08/2007   Chronic  On Benicar    FOOT PAIN 07/25/2009   R  foot OA     GERD 02/02/2007   Wedge pillow Protonix prn, Pepcid - d/c   History of pneumonia 12/2016   History of shingles    Hyperlipidemia    IBS 03/06/2007   Qualifier: Diagnosis of  By: [name] MD, [name]    Impaired glucose tolerance 11/22/2010   Kidney cysts    left- Dr [name]   Lung nodule 05/08/2011   04/15/11 RLL 4 mm nodule on abd CT - [facility] office    Obesity 01/03/2015   Osteoporosis 02/02/2007   Chronic  Vit D, yoga   Palpitations 05/12/2014   Dr [name] 2/16 poss due to steroid pack    Rash and nonspecific skin eruption 09/12/2015   5/17?urticaria Pt is allergic to seafood - the pt is on MegaRed krill oil - d/c MegaRed    Upper airway cough syndrome 12/08/2014   Dr [name] Flutter valve added 01/02/2015 > resolved 02/03/2015  - rec try off gerd rx p 03/09/15    Past Surgical History:  Procedure Laterality Date   APPENDECTOMY     CHOLECYSTECTOMY     HYSTEROSCOPY WITH RESECTOSCOPE  7/99   resect polyp   MOHS SURGERY  05/2018   face   TUBAL  LIGATION Bilateral    Patient Active Problem List   Diagnosis Date Noted   Hyperkalemia 06/10/2021   Chronic bronchitis ([place]) 05/30/2021   Lacunar stroke ([place]) 03/08/2021   Prediabetes 01/02/2021   History of CVA (cerebrovascular accident) without residual deficits 01/01/2021   Balance problem 11/29/2020   Acute cystitis with hematuria 01/31/2020   Recurrent UTI 01/31/2020   Low back pain 10/13/2019   CAD (coronary artery disease) 03/04/2018   Lightheadedness 11/05/2017   Canker sore 01/01/2017   Kidney cyst, acquired 07/22/2016   Rash and nonspecific skin eruption 09/12/2015   Obesity 01/03/2015   Upper airway cough syndrome 12/08/2014   Right-sided thoracic back pain 05/12/2014   Palpitations 05/12/2014   Snoring 12/22/2013   Diarrhea 07/19/2013   Dizziness 07/19/2013   Fatigue 10/15/2011   Impaired glucose tolerance 11/22/2010   Well adult exam 11/22/2010   Abdominal pain, lower 11/22/2010   SYNCOPE 09/27/2009   Edema  09/27/2009   Recurrent pneumonia 08/15/2009   ANEMIA, NORMOCYTIC 08/10/2009   SKIN RASH 08/10/2009   FOOT PAIN 07/25/2009   Dyspnea 07/01/2008   Acute bronchitis 06/12/2007   Essential hypertension 03/08/2007   DIVERTICULOSIS, COLON 03/08/2007   ESOPHAGEAL STRICTURE 03/06/2007   IBS 03/06/2007   SHINGLES, HX OF 03/06/2007   Dyslipidemia 02/02/2007   GERD 02/02/2007   Osteoporosis 02/02/2007   DVT, HX OF 02/02/2007    PCP: [name], MD  REFERRING PROVIDER: [name], MD  REFERRING DIAG:  M54.50,G89.29 (ICD-10-CM) - Chronic right-sided low back pain without sciatica  R26.9 (ICD-10-CM) - Gait disorder    Rationale for Evaluation and Treatment: Rehabilitation  THERAPY DIAG:  Muscle weakness (generalized)  Unsteadiness on feet  History of falling  Difficulty in walking, not elsewhere classified  ONSET DATE: 01/14/2022  SUBJECTIVE:                                                                                                                                                                                           SUBJECTIVE STATEMENT:  I fell 2 weeks ago before the first PT appointment and hit my shoulder its still bothering me; I have OA in my back and bridges are bothering it.   PERTINENT HISTORY:    PAIN:  Are you having pain? No: NPRS scale: 0/10  PRECAUTIONS: Fall, very HOH   WEIGHT BEARING RESTRICTIONS: No  FALLS:  Has patient fallen in last 6 months? Yes. Number of falls 1 on the 17th; (+) FOF  LIVING ENVIRONMENT: Lives with: lives alone but has a daughter that lives 3 minutes away  Lives in: House/apartment Stairs: 5STE with L ascending rail, no  steps inside the home but does have threshold step to get into home  Has following equipment at home: Single point cane  OCCUPATION: retired   PLOF: Independent  PATIENT GOALS: want to walk straight and not keel over   NEXT MD VISIT: referring in 5-6 months   OBJECTIVE:    DIAGNOSTIC FINDINGS:  Per pt had xrays done on the 16th, low back and hips looked good but have some arthritis in the back   PATIENT SURVEYS:  FOTO will do 2nd session  SCREENING FOR RED FLAGS: Bowel or bladder incontinence: Yes: bladder incontinence for 5 years, not associated with back pain  Spinal tumors: No Cauda equina syndrome: No Compression fracture: No Abdominal aneurysm: No  COGNITION: Overall cognitive status: Within functional limits for tasks assessed     SENSATION: WFL Not tested  MUSCLE LENGTH:   POSTURE: rounded shoulders, forward head, decreased lumbar lordosis, and increased thoracic kyphosis  PALPATION:   LUMBAR ROM:   AROM eval  Flexion   Extension   Right lateral flexion   Left lateral flexion   Right rotation   Left rotation    (Blank rows = not tested)  LOWER EXTREMITY ROM:     Active  Right eval Left eval  Hip flexion    Hip extension    Hip abduction    Hip adduction    Hip internal rotation    Hip external rotation    Knee flexion    Knee extension    Ankle dorsiflexion    Ankle plantarflexion    Ankle inversion    Ankle eversion     (Blank rows = not tested)  LOWER EXTREMITY MMT:    MMT Right eval Left eval  Hip flexion 3 3  Hip extension    Hip abduction 3+ 3  Hip adduction    Hip internal rotation    Hip external rotation    Knee flexion 5 4  Knee extension 5 4  Ankle dorsiflexion 5 5  Ankle plantarflexion    Ankle inversion    Ankle eversion     (Blank rows = not tested)  LUMBAR SPECIAL TESTS:    FUNCTIONAL TESTS:  5 times sit to stand: 14 seconds no UEs  3 minute walk test: 430f no rest RPE 4/10 Dynamic Gait Index: 14/24  GAIT: Distance walked: in clinic distances  Assistive device utilized: None Level of assistance: Complete Independence Comments: flexed at hips, short step lengths, stance times, L/R drift increasing with fatigue, + trendelenburg. Gait speed 2.7 ft/sec   TODAY'S TREATMENT:                                                                                                                               DATE:   03/26/22  TherEx  Nustep L4 x6 minutes BLEs only Sidelying clams red TB x15 B Supine hip ABD red TB x12 B STS x15 red TB above knees  Hip hikes x12  B Hip hikes + ABD x12 B Forward step ups on 6 inch box x10 B U UE support  Lateral step ups on 6 inch box x10 B U UE support   NMR  Narrow BOS 3x30 seconds EO blue foam pad  Lateral toe taps off edge of blue foam pad x15 B Forward step up/on blue foam pad x10 forward/x10 backward  Tandem stance on blue foam pad 1x30 seconds B  Eval  Objective measures + appropriate education  Bridges with ABD into red TB x5  Standing hip ABD red TB x5 Tandem stance 1x10 seconds       PATIENT EDUCATION:  Education details: HEP adjustments, exercise form/purpose  Person educated: Patient Education method: Consulting civil engineer, Media planner, and Handouts Education comprehension: verbalized understanding, returned demonstration, and needs further education  HOME EXERCISE PROGRAM:  Access Code: [email or web address] URL: [email or web address] Date: 03/26/2022 Prepared by: Deniece Ree  Exercises - Hip Abduction with Resistance Loop  - 2 x daily - 7 x weekly - 1 sets - 10 reps - 1 hold - Tandem Stance  - 2 x daily - 7 x weekly - 1 sets - 3 reps - 15-30 hold - Sit to Stand with Resistance Around Legs  - 2 x daily - 7 x weekly - 1 sets - 10 reps  ASSESSMENT:  CLINICAL IMPRESSION:  Kynleigh arrives today doing OK, we did need to adjust her HEP due to some back pain from bridges. Worked on functional strength and balance as able. Will continue to progress.   OBJECTIVE IMPAIRMENTS: Abnormal gait, decreased activity tolerance, decreased balance, decreased coordination, decreased mobility, difficulty walking, decreased strength, and postural dysfunction.   ACTIVITY LIMITATIONS: standing, stairs, transfers, and  locomotion level  PARTICIPATION LIMITATIONS: shopping, community activity, yard work, and church  PERSONAL FACTORS: Fitness, Social background, and Time since onset of injury/illness/exacerbation are also affecting patient's functional outcome.   REHAB POTENTIAL: Good  CLINICAL DECISION MAKING: Stable/uncomplicated  EVALUATION COMPLEXITY: Low   GOALS: Goals reviewed with patient? Yes  SHORT TERM GOALS: Target date: 04/02/2022    Will be compliant with appropriate progressive HEP  Baseline: Goal status: INITIAL  2.  Will be able to name 3 ways to prevent falls at home and in the community  Baseline:  Goal status: INITIAL    LONG TERM GOALS: Target date: 04/23/2022    MMT to improve by 1 grade in all weak groups  Baseline:  Goal status: INITIAL  2.  Will be able to ambulate at least 651f in 3MWT to show improved mobility and community access  Baseline:  Goal status: INITIAL  3.  Will be able to ambulate community distances with no L/R drift and no feelings of unsteadiness Baseline:  Goal status: INITIAL  4.  Will score at least 20 on DGI to show improved functional balance skills  Baseline:  Goal status: INITIAL  5.  Will be able to navigate at least 5 steps with no rail and step over step pattern with minimal unsteadiness to show improved balance and community access  Baseline:  Goal status: INITIAL  6.  Will be compliant with appropriate gym based exercise program to maintain functional gains and prevent recurrence of condition  Baseline:  Goal status: INITIAL  PLAN:  PT FREQUENCY: 2x/week  PT DURATION: 6 weeks  PLANNED INTERVENTIONS: Therapeutic exercises, Therapeutic activity, Neuromuscular re-education, Balance training, Gait training, Patient/Family education, Self Care, Joint mobilization, Stair training, DME instructions, Aquatic Therapy, Spinal mobilization, Cryotherapy, Moist heat, Taping, Ultrasound, Ionotophoresis '4mg'$ /ml Dexamethasone, Manual  therapy, and Re-evaluation.  PLAN FOR NEXT SESSION: balance and strength focus, HEP updates PRN    Chloeann Alfred U PT DPT PN2  03/26/2022, 1:42 PM

## 2022-03-28 ENCOUNTER — Ambulatory Visit (HOSPITAL_BASED_OUTPATIENT_CLINIC_OR_DEPARTMENT_OTHER): Payer: PPO | Admitting: Physical Therapy

## 2022-03-28 ENCOUNTER — Encounter (HOSPITAL_BASED_OUTPATIENT_CLINIC_OR_DEPARTMENT_OTHER): Payer: Self-pay | Admitting: Physical Therapy

## 2022-03-28 DIAGNOSIS — G8929 Other chronic pain: Secondary | ICD-10-CM | POA: Diagnosis not present

## 2022-03-28 DIAGNOSIS — R2681 Unsteadiness on feet: Secondary | ICD-10-CM

## 2022-03-28 DIAGNOSIS — M6281 Muscle weakness (generalized): Secondary | ICD-10-CM

## 2022-03-28 DIAGNOSIS — R262 Difficulty in walking, not elsewhere classified: Secondary | ICD-10-CM

## 2022-03-28 DIAGNOSIS — Z9181 History of falling: Secondary | ICD-10-CM

## 2022-03-28 NOTE — Therapy (Signed)
OUTPATIENT PHYSICAL THERAPY THORACOLUMBAR TREATMENT   Patient Name: Diana Stout MRN: [id number] DOB:1930-04-22, 88 y.o., female Today's Date: 03/28/2022  END OF SESSION:  PT End of Session - 03/28/22 1352     Visit Number 3    Number of Visits 13    Date for PT Re-Evaluation 04/23/22    Authorization Type HTA    Authorization Time Period 03/12/22 to 05/07/22    Progress Note Due on Visit 10    PT Start Time 1345    PT Stop Time 1429    PT Time Calculation (min) 44 min    Activity Tolerance Patient tolerated treatment well    Behavior During Therapy [facility] for tasks assessed/performed               Past Medical History:  Diagnosis Date   Abdominal pain, lower 11/22/2010   2015 resolved off Zocor 5/17?urticaria w/GI upset: Dtr is allergic to seafood - the pt is on MegaRed krill oil - d/c MegaRed    Abnormal chest CT 05/22/2011   Followed in Pulmonary clinic/ [facility]/ [place]  05/14/11 CT CHEST WITHOUT CONTRAST 1. Multiple small pulmonary nodules scattered throughout the lungs  bilaterally ranging in size from 3-6 mm.    2. There is a small hiatal hernia.  3. Atherosclerosis.  4. Status post cholecystectomy.  5. Low attenuation hepatic lesions unchanged compared to recent  abdominal CT scan, likely to represent cysts   ANEMIA, NORMOCYTIC 08/10/2009   mild     DIVERTICULOSIS, COLON 03/08/2007   Qualifier: Diagnosis of  By: [name] MD, [name]    Dizziness 07/19/2013   Likely due to diarrhea/meds 4/15    DVT (deep venous thrombosis) ([place]) 1998   hx of right   Dyslipidemia 02/02/2007   Chronic 1/13 worse     Dyspnea 07/01/2008   2/13 improved w/reg exercise 1/17 deconditioning - CL stress test was nl     Edema 09/27/2009   Qualifier: Diagnosis of  By: [name], MD, [name]    ESOPHAGEAL STRICTURE 03/06/2007   Qualifier: Diagnosis of  By: [name] MD, [name]    Esophagitis 2006   Essential hypertension 03/08/2007   Chronic  On Benicar    FOOT PAIN 07/25/2009   R  foot OA     GERD 02/02/2007   Wedge pillow Protonix prn, Pepcid - d/c   History of pneumonia 12/2016   History of shingles    Hyperlipidemia    IBS 03/06/2007   Qualifier: Diagnosis of  By: [name] MD, [name]    Impaired glucose tolerance 11/22/2010   Kidney cysts    left- Dr [name]   Lung nodule 05/08/2011   04/15/11 RLL 4 mm nodule on abd CT - [facility] office    Obesity 01/03/2015   Osteoporosis 02/02/2007   Chronic  Vit D, yoga   Palpitations 05/12/2014   Dr [name] 2/16 poss due to steroid pack    Rash and nonspecific skin eruption 09/12/2015   5/17?urticaria Pt is allergic to seafood - the pt is on MegaRed krill oil - d/c MegaRed    Upper airway cough syndrome 12/08/2014   Dr [name] Flutter valve added 01/02/2015 > resolved 02/03/2015  - rec try off gerd rx p 03/09/15    Past Surgical History:  Procedure Laterality Date   APPENDECTOMY     CHOLECYSTECTOMY     HYSTEROSCOPY WITH RESECTOSCOPE  7/99   resect polyp   MOHS SURGERY  05/2018   face  TUBAL LIGATION Bilateral    Patient Active Problem List   Diagnosis Date Noted   Hyperkalemia 06/10/2021   Chronic bronchitis ([place]) 05/30/2021   Lacunar stroke ([place]) 03/08/2021   Prediabetes 01/02/2021   History of CVA (cerebrovascular accident) without residual deficits 01/01/2021   Balance problem 11/29/2020   Acute cystitis with hematuria 01/31/2020   Recurrent UTI 01/31/2020   Low back pain 10/13/2019   CAD (coronary artery disease) 03/04/2018   Lightheadedness 11/05/2017   Canker sore 01/01/2017   Kidney cyst, acquired 07/22/2016   Rash and nonspecific skin eruption 09/12/2015   Obesity 01/03/2015   Upper airway cough syndrome 12/08/2014   Right-sided thoracic back pain 05/12/2014   Palpitations 05/12/2014   Snoring 12/22/2013   Diarrhea 07/19/2013   Dizziness 07/19/2013   Fatigue 10/15/2011   Impaired glucose tolerance 11/22/2010   Well adult exam 11/22/2010   Abdominal pain, lower 11/22/2010   SYNCOPE 09/27/2009   Edema  09/27/2009   Recurrent pneumonia 08/15/2009   ANEMIA, NORMOCYTIC 08/10/2009   SKIN RASH 08/10/2009   FOOT PAIN 07/25/2009   Dyspnea 07/01/2008   Acute bronchitis 06/12/2007   Essential hypertension 03/08/2007   DIVERTICULOSIS, COLON 03/08/2007   ESOPHAGEAL STRICTURE 03/06/2007   IBS 03/06/2007   SHINGLES, HX OF 03/06/2007   Dyslipidemia 02/02/2007   GERD 02/02/2007   Osteoporosis 02/02/2007   DVT, HX OF 02/02/2007    PCP: Plotnikov, [name], MD  REFERRING PROVIDER: [name], MD  REFERRING DIAG:  M54.50,G89.29 (ICD-10-CM) - Chronic right-sided low back pain without sciatica  R26.9 (ICD-10-CM) - Gait disorder    Rationale for Evaluation and Treatment: Rehabilitation  THERAPY DIAG:  Muscle weakness (generalized)  Unsteadiness on feet  History of falling  Difficulty in walking, not elsewhere classified  ONSET DATE: 01/14/2022  SUBJECTIVE:                                                                                                                                                                                           SUBJECTIVE STATEMENT:  Doing OK, felt good after last time. Nothing new.   PERTINENT HISTORY:    PAIN:  Are you having pain? No: NPRS scale: 0/10  PRECAUTIONS: Fall, very HOH   WEIGHT BEARING RESTRICTIONS: No  FALLS:  Has patient fallen in last 6 months? Yes. Number of falls 1 on the 17th; (+) FOF  LIVING ENVIRONMENT: Lives with: lives alone but has a daughter that lives 3 minutes away  Lives in: House/apartment Stairs: 5STE with L ascending rail, no steps inside the home but does have threshold step to get into home  Has following equipment at home:  Single point cane  OCCUPATION: retired   PLOF: Independent  PATIENT GOALS: want to walk straight and not keel over   NEXT MD VISIT: referring in 5-6 months   OBJECTIVE:   DIAGNOSTIC FINDINGS:  Per pt had xrays done on the 16th, low back and hips looked good but have  some arthritis in the back   PATIENT SURVEYS:  FOTO will do 2nd session  SCREENING FOR RED FLAGS: Bowel or bladder incontinence: Yes: bladder incontinence for 5 years, not associated with back pain  Spinal tumors: No Cauda equina syndrome: No Compression fracture: No Abdominal aneurysm: No  COGNITION: Overall cognitive status: Within functional limits for tasks assessed     SENSATION: WFL Not tested  MUSCLE LENGTH:   POSTURE: rounded shoulders, forward head, decreased lumbar lordosis, and increased thoracic kyphosis  PALPATION:   LUMBAR ROM:   AROM eval  Flexion   Extension   Right lateral flexion   Left lateral flexion   Right rotation   Left rotation    (Blank rows = not tested)  LOWER EXTREMITY ROM:     Active  Right eval Left eval  Hip flexion    Hip extension    Hip abduction    Hip adduction    Hip internal rotation    Hip external rotation    Knee flexion    Knee extension    Ankle dorsiflexion    Ankle plantarflexion    Ankle inversion    Ankle eversion     (Blank rows = not tested)  LOWER EXTREMITY MMT:    MMT Right eval Left eval  Hip flexion 3 3  Hip extension    Hip abduction 3+ 3  Hip adduction    Hip internal rotation    Hip external rotation    Knee flexion 5 4  Knee extension 5 4  Ankle dorsiflexion 5 5  Ankle plantarflexion    Ankle inversion    Ankle eversion     (Blank rows = not tested)  LUMBAR SPECIAL TESTS:    FUNCTIONAL TESTS:  5 times sit to stand: 14 seconds no UEs  3 minute walk test: 453f no rest RPE 4/10 Dynamic Gait Index: 14/24  GAIT: Distance walked: in clinic distances  Assistive device utilized: None Level of assistance: Complete Independence Comments: flexed at hips, short step lengths, stance times, L/R drift increasing with fatigue, + trendelenburg. Gait speed 2.7 ft/sec   TODAY'S TREATMENT:                                                                                                                               DATE:   03/28/22  TherEx  Nustep L4x6 minutes BLEs only  Sidelying hip ABD x15 B red TB  Sidelying hip hikes x10 B  Sidelying hip hikes + CW/CCW circles x10 each  Sidestepping hip hikes x10 steps each direction Forward step ups 6 inch box x15 B U UE support  Squats holding onto bar x12 cues for form cues to minimize pulling with UEs   NMR  Tandem stance blue foam pad 3x30 seconds B  SLS with one foot on 6 inch box 3x30 seconds B solid surface  3 way toe taps on blue foam pad x5 B one finger support  EC standing on blue foam pad 3x30 seconds    03/26/22  TherEx  Nustep L4 x6 minutes BLEs only Sidelying clams red TB x15 B Supine hip ABD red TB x12 B STS x15 red TB above knees  Hip hikes x12 B Hip hikes + ABD x12 B Forward step ups on 6 inch box x10 B U UE support  Lateral step ups on 6 inch box x10 B U UE support   NMR  Narrow BOS 3x30 seconds EO blue foam pad  Lateral toe taps off edge of blue foam pad x15 B Forward step up/on blue foam pad x10 forward/x10 backward  Tandem stance on blue foam pad 1x30 seconds B  Eval  Objective measures + appropriate education  Bridges with ABD into red TB x5  Standing hip ABD red TB x5 Tandem stance 1x10 seconds       PATIENT EDUCATION:  Education details: HEP adjustments, exercise form/purpose  Person educated: Patient Education method: Consulting civil engineer, Media planner, and Handouts Education comprehension: verbalized understanding, returned demonstration, and needs further education  HOME EXERCISE PROGRAM:  Access Code: [email or web address] URL: [email or web address] Date: 03/28/2022 Prepared by: Deniece Ree  Exercises - Hip Abduction with Resistance Loop  - 2 x daily - 7 x weekly - 1 sets - 10 reps - 1 hold - Tandem Stance  - 2 x daily - 7 x weekly - 1 sets - 3 reps - 15-30 hold - Sit to Stand with Resistance Around Legs  - 2 x daily - 7 x weekly - 1 sets - 10 reps - Standing Hip Hiking  -  1 x daily - 7 x weekly - 3 sets - 10 reps - Single Leg Stance  - 1 x daily - 7 x weekly - 3 sets - 10 reps  ASSESSMENT:  CLINICAL IMPRESSION:  Charvi arrives today doing OK, we progressed all exercises and activities as tolerated. Worked in a bit more balance training today too. Will continue efforts.   OBJECTIVE IMPAIRMENTS: Abnormal gait, decreased activity tolerance, decreased balance, decreased coordination, decreased mobility, difficulty walking, decreased strength, and postural dysfunction.   ACTIVITY LIMITATIONS: standing, stairs, transfers, and locomotion level  PARTICIPATION LIMITATIONS: shopping, community activity, yard work, and church  PERSONAL FACTORS: Fitness, Social background, and Time since onset of injury/illness/exacerbation are also affecting patient's functional outcome.   REHAB POTENTIAL: Good  CLINICAL DECISION MAKING: Stable/uncomplicated  EVALUATION COMPLEXITY: Low   GOALS: Goals reviewed with patient? Yes  SHORT TERM GOALS: Target date: 04/02/2022    Will be compliant with appropriate progressive HEP  Baseline: Goal status: INITIAL  2.  Will be able to name 3 ways to prevent falls at home and in the community  Baseline:  Goal status: INITIAL    LONG TERM GOALS: Target date: 04/23/2022    MMT to improve by 1 grade in all weak groups  Baseline:  Goal status: INITIAL  2.  Will be able to ambulate at least 6102f in 3MWT to show improved mobility and community access  Baseline:  Goal status: INITIAL  3.  Will be able to ambulate community distances with no L/R drift and no feelings of unsteadiness Baseline:  Goal status: INITIAL  4.  Will score at least 20 on DGI to show improved functional balance skills  Baseline:  Goal status: INITIAL  5.  Will be able to navigate at least 5 steps with no rail and step over step pattern with minimal unsteadiness to show improved balance and community access  Baseline:  Goal status: INITIAL  6.   Will be compliant with appropriate gym based exercise program to maintain functional gains and prevent recurrence of condition  Baseline:  Goal status: INITIAL  PLAN:  PT FREQUENCY: 2x/week  PT DURATION: 6 weeks  PLANNED INTERVENTIONS: Therapeutic exercises, Therapeutic activity, Neuromuscular re-education, Balance training, Gait training, Patient/Family education, Self Care, Joint mobilization, Stair training, DME instructions, Aquatic Therapy, Spinal mobilization, Cryotherapy, Moist heat, Taping, Ultrasound, Ionotophoresis '4mg'$ /ml Dexamethasone, Manual therapy, and Re-evaluation.  PLAN FOR NEXT SESSION: balance and strength focus, HEP updates PRN    Rohin Krejci U PT DPT PN2  03/28/2022, 2:30 PM

## 2022-04-03 ENCOUNTER — Encounter (HOSPITAL_BASED_OUTPATIENT_CLINIC_OR_DEPARTMENT_OTHER): Payer: Self-pay | Admitting: Physical Therapy

## 2022-04-03 ENCOUNTER — Ambulatory Visit (HOSPITAL_BASED_OUTPATIENT_CLINIC_OR_DEPARTMENT_OTHER): Payer: PPO | Admitting: Physical Therapy

## 2022-04-03 DIAGNOSIS — R2681 Unsteadiness on feet: Secondary | ICD-10-CM

## 2022-04-03 DIAGNOSIS — M6281 Muscle weakness (generalized): Secondary | ICD-10-CM

## 2022-04-03 DIAGNOSIS — G8929 Other chronic pain: Secondary | ICD-10-CM | POA: Diagnosis not present

## 2022-04-03 DIAGNOSIS — R262 Difficulty in walking, not elsewhere classified: Secondary | ICD-10-CM

## 2022-04-03 DIAGNOSIS — Z9181 History of falling: Secondary | ICD-10-CM

## 2022-04-03 NOTE — Therapy (Signed)
OUTPATIENT PHYSICAL THERAPY THORACOLUMBAR TREATMENT   Patient Name: Diana Stout MRN: [id number] DOB:[birth year], 88 y.o., female Today's Date: 04/03/2022  END OF SESSION:  PT End of Session - 04/03/22 1312     Visit Number 4    Number of Visits 13    Date for PT Re-Evaluation 04/23/22    Authorization Type HTA    Authorization Time Period 03/12/22 to 05/07/22    Progress Note Due on Visit 10    PT Start Time 1306    PT Stop Time 1345    PT Time Calculation (min) 39 min    Activity Tolerance Patient tolerated treatment well    Behavior During Therapy [facility] for tasks assessed/performed                Past Medical History:  Diagnosis Date   Abdominal pain, lower 11/22/2010   2015 resolved off Zocor 5/17?urticaria w/GI upset: Dtr is allergic to seafood - the pt is on MegaRed krill oil - d/c MegaRed    Abnormal chest CT 05/22/2011   Followed in Pulmonary clinic/ [facility]/ [place]  05/14/11 CT CHEST WITHOUT CONTRAST 1. Multiple small pulmonary nodules scattered throughout the lungs  bilaterally ranging in size from 3-6 mm.    2. There is a small hiatal hernia.  3. Atherosclerosis.  4. Status post cholecystectomy.  5. Low attenuation hepatic lesions unchanged compared to recent  abdominal CT scan, likely to represent cysts   ANEMIA, NORMOCYTIC 08/10/2009   mild     DIVERTICULOSIS, COLON 03/08/2007   Qualifier: Diagnosis of  By: [name] MD, [name]    Dizziness 07/19/2013   Likely due to diarrhea/meds 4/15    DVT (deep venous thrombosis) ([place]) 1998   hx of right   Dyslipidemia 02/02/2007   Chronic 1/13 worse     Dyspnea 07/01/2008   2/13 improved w/reg exercise 1/17 deconditioning - CL stress test was nl     Edema 09/27/2009   Qualifier: Diagnosis of  By: [name], MD, [name]    ESOPHAGEAL STRICTURE 03/06/2007   Qualifier: Diagnosis of  By: [name] MD, [name]    Esophagitis 2006   Essential hypertension 03/08/2007   Chronic  On Benicar    FOOT PAIN 07/25/2009    R foot OA     GERD 02/02/2007   Wedge pillow Protonix prn, Pepcid - d/c   History of pneumonia 12/2016   History of shingles    Hyperlipidemia    IBS 03/06/2007   Qualifier: Diagnosis of  By: [name] MD, [name]    Impaired glucose tolerance 11/22/2010   Kidney cysts    left- Dr [name]   Lung nodule 05/08/2011   04/15/11 RLL 4 mm nodule on abd CT - [facility] office    Obesity 01/03/2015   Osteoporosis 02/02/2007   Chronic  Vit D, yoga   Palpitations 05/12/2014   Dr [name] 2/16 poss due to steroid pack    Rash and nonspecific skin eruption 09/12/2015   5/17?urticaria Pt is allergic to seafood - the pt is on MegaRed krill oil - d/c MegaRed    Upper airway cough syndrome 12/08/2014   Dr [name] Flutter valve added 01/02/2015 > resolved 02/03/2015  - rec try off gerd rx p 03/09/15    Past Surgical History:  Procedure Laterality Date   APPENDECTOMY     CHOLECYSTECTOMY     HYSTEROSCOPY WITH RESECTOSCOPE  7/99   resect polyp   MOHS SURGERY  05/2018   face  TUBAL LIGATION Bilateral    Patient Active Problem List   Diagnosis Date Noted   Hyperkalemia 06/10/2021   Chronic bronchitis ([place]) 05/30/2021   Lacunar stroke ([place]) 03/08/2021   Prediabetes 01/02/2021   History of CVA (cerebrovascular accident) without residual deficits 01/01/2021   Balance problem 11/29/2020   Acute cystitis with hematuria 01/31/2020   Recurrent UTI 01/31/2020   Low back pain 10/13/2019   CAD (coronary artery disease) 03/04/2018   Lightheadedness 11/05/2017   Canker sore 01/01/2017   Kidney cyst, acquired 07/22/2016   Rash and nonspecific skin eruption 09/12/2015   Obesity 01/03/2015   Upper airway cough syndrome 12/08/2014   Right-sided thoracic back pain 05/12/2014   Palpitations 05/12/2014   Snoring 12/22/2013   Diarrhea 07/19/2013   Dizziness 07/19/2013   Fatigue 10/15/2011   Impaired glucose tolerance 11/22/2010   Well adult exam 11/22/2010   Abdominal pain, lower 11/22/2010   SYNCOPE 09/27/2009   Edema  09/27/2009   Recurrent pneumonia 08/15/2009   ANEMIA, NORMOCYTIC 08/10/2009   SKIN RASH 08/10/2009   FOOT PAIN 07/25/2009   Dyspnea 07/01/2008   Acute bronchitis 06/12/2007   Essential hypertension 03/08/2007   DIVERTICULOSIS, COLON 03/08/2007   ESOPHAGEAL STRICTURE 03/06/2007   IBS 03/06/2007   SHINGLES, HX OF 03/06/2007   Dyslipidemia 02/02/2007   GERD 02/02/2007   Osteoporosis 02/02/2007   DVT, HX OF 02/02/2007    PCP: Plotnikov, [name], MD  REFERRING PROVIDER: [name], MD  REFERRING DIAG:  M54.50,G89.29 (ICD-10-CM) - Chronic right-sided low back pain without sciatica  R26.9 (ICD-10-CM) - Gait disorder    Rationale for Evaluation and Treatment: Rehabilitation  THERAPY DIAG:  Muscle weakness (generalized)  History of falling  Unsteadiness on feet  Difficulty in walking, not elsewhere classified  ONSET DATE: 01/14/2022  SUBJECTIVE:                                                                                                                                                                                           SUBJECTIVE STATEMENT:  Doing OK, I'm early today. Nothing new.   PERTINENT HISTORY:    PAIN:  Are you having pain? No: NPRS scale: 0/10  PRECAUTIONS: Fall, very HOH   WEIGHT BEARING RESTRICTIONS: No  FALLS:  Has patient fallen in last 6 months? Yes. Number of falls 1 on the 17th; (+) FOF  LIVING ENVIRONMENT: Lives with: lives alone but has a daughter that lives 3 minutes away  Lives in: House/apartment Stairs: 5STE with L ascending rail, no steps inside the home but does have threshold step to get into home  Has following equipment at home: Single point  cane  OCCUPATION: retired   PLOF: Independent  PATIENT GOALS: want to walk straight and not keel over   NEXT MD VISIT: referring in 5-6 months   OBJECTIVE:   DIAGNOSTIC FINDINGS:  Per pt had xrays done on the 16th, low back and hips looked good but have some  arthritis in the back   PATIENT SURVEYS:  FOTO will do 2nd session  SCREENING FOR RED FLAGS: Bowel or bladder incontinence: Yes: bladder incontinence for 5 years, not associated with back pain  Spinal tumors: No Cauda equina syndrome: No Compression fracture: No Abdominal aneurysm: No  COGNITION: Overall cognitive status: Within functional limits for tasks assessed     SENSATION: WFL Not tested  MUSCLE LENGTH:   POSTURE: rounded shoulders, forward head, decreased lumbar lordosis, and increased thoracic kyphosis  PALPATION:   LUMBAR ROM:   AROM eval  Flexion   Extension   Right lateral flexion   Left lateral flexion   Right rotation   Left rotation    (Blank rows = not tested)  LOWER EXTREMITY ROM:     Active  Right eval Left eval  Hip flexion    Hip extension    Hip abduction    Hip adduction    Hip internal rotation    Hip external rotation    Knee flexion    Knee extension    Ankle dorsiflexion    Ankle plantarflexion    Ankle inversion    Ankle eversion     (Blank rows = not tested)  LOWER EXTREMITY MMT:    MMT Right eval Left eval  Hip flexion 3 3  Hip extension    Hip abduction 3+ 3  Hip adduction    Hip internal rotation    Hip external rotation    Knee flexion 5 4  Knee extension 5 4  Ankle dorsiflexion 5 5  Ankle plantarflexion    Ankle inversion    Ankle eversion     (Blank rows = not tested)  LUMBAR SPECIAL TESTS:    FUNCTIONAL TESTS:  5 times sit to stand: 14 seconds no UEs  3 minute walk test: 445f no rest RPE 4/10 Dynamic Gait Index: 14/24  GAIT: Distance walked: in clinic distances  Assistive device utilized: None Level of assistance: Complete Independence Comments: flexed at hips, short step lengths, stance times, L/R drift increasing with fatigue, + trendelenburg. Gait speed 2.7 ft/sec   TODAY'S TREATMENT:                                                                                                                               DATE:   04/03/22  TherEx  Nustep L4x6 minutes BLEs only  Sidestepping hip hikes 2x10 steps each direction Hip hikes with swing x15 B 3 way hip with green TB 1x10 B Squats holding onto bar x15 cues for form cues to minimize pulling with UEs   NMR  Alternating cone taps on blue  foam pad x20 intermittent U UE support  Cross midline cone taps from solid surface x10 B light U UE support  Forward step overs 4 inch hurdles x3 laps down and back  Sideways step overs 4 inch hurdles x3 laps down and back  Walking with eyes shut, occasional cues to correct L/R drift   03/28/22  TherEx  Nustep L4x6 minutes BLEs only  Sidelying hip ABD x15 B red TB  Sidelying hip hikes x10 B  Sidelying hip hikes + CW/CCW circles x10 each  Sidestepping hip hikes x10 steps each direction Forward step ups 6 inch box x15 B U UE support  Squats holding onto bar x12 cues for form cues to minimize pulling with UEs   NMR  Tandem stance blue foam pad 3x30 seconds B  SLS with one foot on 6 inch box 3x30 seconds B solid surface  3 way toe taps on blue foam pad x5 B one finger support  EC standing on blue foam pad 3x30 seconds    03/26/22  TherEx  Nustep L4 x6 minutes BLEs only Sidelying clams red TB x15 B Supine hip ABD red TB x12 B STS x15 red TB above knees  Hip hikes x12 B Hip hikes + ABD x12 B Forward step ups on 6 inch box x10 B U UE support  Lateral step ups on 6 inch box x10 B U UE support   NMR  Narrow BOS 3x30 seconds EO blue foam pad  Lateral toe taps off edge of blue foam pad x15 B Forward step up/on blue foam pad x10 forward/x10 backward  Tandem stance on blue foam pad 1x30 seconds B  Eval  Objective measures + appropriate education  Bridges with ABD into red TB x5  Standing hip ABD red TB x5 Tandem stance 1x10 seconds       PATIENT EDUCATION:  Education details: exercise form/purpose  Person educated: Patient Education method: Consulting civil engineer,  Media planner, and Handouts Education comprehension: verbalized understanding, returned demonstration, and needs further education  HOME EXERCISE PROGRAM:  Access Code: [email or web address] URL: [email or web address] Date: 03/28/2022 Prepared by: Deniece Ree  Exercises - Hip Abduction with Resistance Loop  - 2 x daily - 7 x weekly - 1 sets - 10 reps - 1 hold - Tandem Stance  - 2 x daily - 7 x weekly - 1 sets - 3 reps - 15-30 hold - Sit to Stand with Resistance Around Legs  - 2 x daily - 7 x weekly - 1 sets - 10 reps - Standing Hip Hiking  - 1 x daily - 7 x weekly - 3 sets - 10 reps - Single Leg Stance  - 1 x daily - 7 x weekly - 3 sets - 10 reps  ASSESSMENT:  CLINICAL IMPRESSION:  Savi arrived really early today, fortunately I had an open slot and was able to work her in. Continued working on functional strength and balance training today. Seems HOH which does limit command following somewhat, needed a lot of repeated cues today. Will continue efforts.   OBJECTIVE IMPAIRMENTS: Abnormal gait, decreased activity tolerance, decreased balance, decreased coordination, decreased mobility, difficulty walking, decreased strength, and postural dysfunction.   ACTIVITY LIMITATIONS: standing, stairs, transfers, and locomotion level  PARTICIPATION LIMITATIONS: shopping, community activity, yard work, and church  PERSONAL FACTORS: Fitness, Social background, and Time since onset of injury/illness/exacerbation are also affecting patient's functional outcome.   REHAB POTENTIAL: Good  CLINICAL DECISION MAKING: Stable/uncomplicated  EVALUATION COMPLEXITY: Low   GOALS: Goals reviewed with  patient? Yes  SHORT TERM GOALS: Target date: 04/02/2022    Will be compliant with appropriate progressive HEP  Baseline: Goal status: INITIAL  2.  Will be able to name 3 ways to prevent falls at home and in the community  Baseline:  Goal status: INITIAL    LONG TERM GOALS: Target date:  04/23/2022    MMT to improve by 1 grade in all weak groups  Baseline:  Goal status: INITIAL  2.  Will be able to ambulate at least 623f in 3MWT to show improved mobility and community access  Baseline:  Goal status: INITIAL  3.  Will be able to ambulate community distances with no L/R drift and no feelings of unsteadiness Baseline:  Goal status: INITIAL  4.  Will score at least 20 on DGI to show improved functional balance skills  Baseline:  Goal status: INITIAL  5.  Will be able to navigate at least 5 steps with no rail and step over step pattern with minimal unsteadiness to show improved balance and community access  Baseline:  Goal status: INITIAL  6.  Will be compliant with appropriate gym based exercise program to maintain functional gains and prevent recurrence of condition  Baseline:  Goal status: INITIAL  PLAN:  PT FREQUENCY: 2x/week  PT DURATION: 6 weeks  PLANNED INTERVENTIONS: Therapeutic exercises, Therapeutic activity, Neuromuscular re-education, Balance training, Gait training, Patient/Family education, Self Care, Joint mobilization, Stair training, DME instructions, Aquatic Therapy, Spinal mobilization, Cryotherapy, Moist heat, Taping, Ultrasound, Ionotophoresis '4mg'$ /ml Dexamethasone, Manual therapy, and Re-evaluation.  PLAN FOR NEXT SESSION: check STGs next session balance and strength focus, HEP updates PRN    Osiris Charles U PT DPT PN2  04/03/2022, 1:46 PM

## 2022-04-05 ENCOUNTER — Ambulatory Visit (HOSPITAL_BASED_OUTPATIENT_CLINIC_OR_DEPARTMENT_OTHER): Payer: PPO | Admitting: Physical Therapy

## 2022-04-05 ENCOUNTER — Encounter (HOSPITAL_BASED_OUTPATIENT_CLINIC_OR_DEPARTMENT_OTHER): Payer: Self-pay | Admitting: Physical Therapy

## 2022-04-05 DIAGNOSIS — G8929 Other chronic pain: Secondary | ICD-10-CM | POA: Diagnosis not present

## 2022-04-05 DIAGNOSIS — R262 Difficulty in walking, not elsewhere classified: Secondary | ICD-10-CM

## 2022-04-05 DIAGNOSIS — R2681 Unsteadiness on feet: Secondary | ICD-10-CM

## 2022-04-05 DIAGNOSIS — Z9181 History of falling: Secondary | ICD-10-CM

## 2022-04-05 DIAGNOSIS — M6281 Muscle weakness (generalized): Secondary | ICD-10-CM

## 2022-04-05 NOTE — Therapy (Signed)
OUTPATIENT PHYSICAL THERAPY THORACOLUMBAR TREATMENT   Patient Name: Diana Stout MRN: [id number] DOB:December 26, 1930, 88 y.o., female Today's Date: 04/05/2022  END OF SESSION:  PT End of Session - 04/05/22 1351     Visit Number 5    Number of Visits 13    Date for PT Re-Evaluation 04/23/22    Authorization Type HTA    Authorization Time Period 03/12/22 to 05/07/22    Progress Note Due on Visit 10    PT Start Time 1346    PT Stop Time 1427    PT Time Calculation (min) 41 min    Activity Tolerance Patient tolerated treatment well    Behavior During Therapy [facility] for tasks assessed/performed                 Past Medical History:  Diagnosis Date   Abdominal pain, lower 11/22/2010   2015 resolved off Zocor 5/17?urticaria w/GI upset: Dtr is allergic to seafood - the pt is on MegaRed krill oil - d/c MegaRed    Abnormal chest CT 05/22/2011   Followed in Pulmonary clinic/ [facility]/ [place]  05/14/11 CT CHEST WITHOUT CONTRAST 1. Multiple small pulmonary nodules scattered throughout the lungs  bilaterally ranging in size from 3-6 mm.    2. There is a small hiatal hernia.  3. Atherosclerosis.  4. Status post cholecystectomy.  5. Low attenuation hepatic lesions unchanged compared to recent  abdominal CT scan, likely to represent cysts   ANEMIA, NORMOCYTIC 08/10/2009   mild     DIVERTICULOSIS, COLON 03/08/2007   Qualifier: Diagnosis of  By: [name] MD, [name]    Dizziness 07/19/2013   Likely due to diarrhea/meds 4/15    DVT (deep venous thrombosis) ([place]) 1998   hx of right   Dyslipidemia 02/02/2007   Chronic 1/13 worse     Dyspnea 07/01/2008   2/13 improved w/reg exercise 1/17 deconditioning - CL stress test was nl     Edema 09/27/2009   Qualifier: Diagnosis of  By: [name], MD, [name]    ESOPHAGEAL STRICTURE 03/06/2007   Qualifier: Diagnosis of  By: [name] MD, [name]    Esophagitis 2006   Essential hypertension 03/08/2007   Chronic  On Benicar    FOOT PAIN 07/25/2009    R foot OA     GERD 02/02/2007   Wedge pillow Protonix prn, Pepcid - d/c   History of pneumonia 12/2016   History of shingles    Hyperlipidemia    IBS 03/06/2007   Qualifier: Diagnosis of  By: [name] MD, [name]    Impaired glucose tolerance 11/22/2010   Kidney cysts    left- Dr [name]   Lung nodule 05/08/2011   04/15/11 RLL 4 mm nodule on abd CT - [facility] office    Obesity 01/03/2015   Osteoporosis 02/02/2007   Chronic  Vit D, yoga   Palpitations 05/12/2014   Dr [name] 2/16 poss due to steroid pack    Rash and nonspecific skin eruption 09/12/2015   5/17?urticaria Pt is allergic to seafood - the pt is on MegaRed krill oil - d/c MegaRed    Upper airway cough syndrome 12/08/2014   Dr [name] Flutter valve added 01/02/2015 > resolved 02/03/2015  - rec try off gerd rx p 03/09/15    Past Surgical History:  Procedure Laterality Date   APPENDECTOMY     CHOLECYSTECTOMY     HYSTEROSCOPY WITH RESECTOSCOPE  7/99   resect polyp   MOHS SURGERY  05/2018   face  TUBAL LIGATION Bilateral    Patient Active Problem List   Diagnosis Date Noted   Hyperkalemia 06/10/2021   Chronic bronchitis ([place]) 05/30/2021   Lacunar stroke ([place]) 03/08/2021   Prediabetes 01/02/2021   History of CVA (cerebrovascular accident) without residual deficits 01/01/2021   Balance problem 11/29/2020   Acute cystitis with hematuria 01/31/2020   Recurrent UTI 01/31/2020   Low back pain 10/13/2019   CAD (coronary artery disease) 03/04/2018   Lightheadedness 11/05/2017   Canker sore 01/01/2017   Kidney cyst, acquired 07/22/2016   Rash and nonspecific skin eruption 09/12/2015   Obesity 01/03/2015   Upper airway cough syndrome 12/08/2014   Right-sided thoracic back pain 05/12/2014   Palpitations 05/12/2014   Snoring 12/22/2013   Diarrhea 07/19/2013   Dizziness 07/19/2013   Fatigue 10/15/2011   Impaired glucose tolerance 11/22/2010   Well adult exam 11/22/2010   Abdominal pain, lower 11/22/2010   SYNCOPE 09/27/2009    Edema 09/27/2009   Recurrent pneumonia 08/15/2009   ANEMIA, NORMOCYTIC 08/10/2009   SKIN RASH 08/10/2009   FOOT PAIN 07/25/2009   Dyspnea 07/01/2008   Acute bronchitis 06/12/2007   Essential hypertension 03/08/2007   DIVERTICULOSIS, COLON 03/08/2007   ESOPHAGEAL STRICTURE 03/06/2007   IBS 03/06/2007   SHINGLES, HX OF 03/06/2007   Dyslipidemia 02/02/2007   GERD 02/02/2007   Osteoporosis 02/02/2007   DVT, HX OF 02/02/2007    PCP: Plotnikov, [name], MD  REFERRING PROVIDER: [name], MD  REFERRING DIAG:  M54.50,G89.29 (ICD-10-CM) - Chronic right-sided low back pain without sciatica  R26.9 (ICD-10-CM) - Gait disorder    Rationale for Evaluation and Treatment: Rehabilitation  THERAPY DIAG:  Muscle weakness (generalized)  History of falling  Unsteadiness on feet  Difficulty in walking, not elsewhere classified  ONSET DATE: 01/14/2022  SUBJECTIVE:                                                                                                                                                                                           SUBJECTIVE STATEMENT:  Doing good, nothing new. I want to keep working on the hurdles, I wasn't raising my foot high enough    PERTINENT HISTORY:    PAIN:  Are you having pain? No: NPRS scale: 0/10  PRECAUTIONS: Fall, very HOH   WEIGHT BEARING RESTRICTIONS: No  FALLS:  Has patient fallen in last 6 months? Yes. Number of falls 1 on the 17th; (+) FOF  LIVING ENVIRONMENT: Lives with: lives alone but has a daughter that lives 3 minutes away  Lives in: House/apartment Stairs: 5STE with L ascending rail, no steps inside the home but does have threshold  step to get into home  Has following equipment at home: Single point cane  OCCUPATION: retired   PLOF: Independent  PATIENT GOALS: want to walk straight and not keel over   NEXT MD VISIT: referring in 5-6 months   OBJECTIVE:   DIAGNOSTIC FINDINGS:  Per pt had xrays  done on the 16th, low back and hips looked good but have some arthritis in the back   PATIENT SURVEYS:  FOTO will do 2nd session  SCREENING FOR RED FLAGS: Bowel or bladder incontinence: Yes: bladder incontinence for 5 years, not associated with back pain  Spinal tumors: No Cauda equina syndrome: No Compression fracture: No Abdominal aneurysm: No  COGNITION: Overall cognitive status: Within functional limits for tasks assessed     SENSATION: WFL Not tested  MUSCLE LENGTH:   POSTURE: rounded shoulders, forward head, decreased lumbar lordosis, and increased thoracic kyphosis  PALPATION:   LUMBAR ROM:   AROM eval  Flexion   Extension   Right lateral flexion   Left lateral flexion   Right rotation   Left rotation    (Blank rows = not tested)  LOWER EXTREMITY ROM:     Active  Right eval Left eval  Hip flexion    Hip extension    Hip abduction    Hip adduction    Hip internal rotation    Hip external rotation    Knee flexion    Knee extension    Ankle dorsiflexion    Ankle plantarflexion    Ankle inversion    Ankle eversion     (Blank rows = not tested)  LOWER EXTREMITY MMT:    MMT Right eval Left eval R 12/29 L 12/29  Hip flexion _0 4-  Hip extension      Hip abduction 3+ 3 3+ 4  Hip adduction      Hip internal rotation      Hip external rotation      Knee flexion 5 4 4+ 4+  Knee extension _1 4+  Ankle dorsiflexion 5 5    Ankle plantarflexion      Ankle inversion      Ankle eversion       (Blank rows = not tested)  LUMBAR SPECIAL TESTS:    FUNCTIONAL TESTS:  5 times sit to stand: 14 seconds no UEs  3 minute walk test: 498f no rest RPE 4/10 Dynamic Gait Index: 14/24  GAIT: Distance walked: in clinic distances  Assistive device utilized: None Level of assistance: Complete Independence Comments: flexed at hips, short step lengths, stance times, L/R drift increasing with fatigue, + trendelenburg. Gait speed 2.7 ft/sec   TODAY'S  TREATMENT:                                                                                                                              DATE:   04/05/22  Objective tests/measures, goal review + appropriate education  3MWT 5139f no  rest RPE 5/10  TherEx  Nustep L4x6 minutes BLEs only Hip hikes + side step 2 rounds B  NMR  Tandem stance blue pad 3x30 seconds  Tandem stance solid surface with vertical and horizontal head turns 4 rounds  Forward step overs/step backs while standing on blue foam pad focus on foot clearance x10 B    04/03/22  TherEx  Nustep L4x6 minutes BLEs only  Sidestepping hip hikes 2x10 steps each direction Hip hikes with swing x15 B 3 way hip with green TB 1x10 B Squats holding onto bar x15 cues for form cues to minimize pulling with UEs   NMR  Alternating cone taps on blue foam pad x20 intermittent U UE support  Cross midline cone taps from solid surface x10 B light U UE support  Forward step overs 4 inch hurdles x3 laps down and back  Sideways step overs 4 inch hurdles x3 laps down and back  Walking with eyes shut, occasional cues to correct L/R drift   03/28/22  TherEx  Nustep L4x6 minutes BLEs only  Sidelying hip ABD x15 B red TB  Sidelying hip hikes x10 B  Sidelying hip hikes + CW/CCW circles x10 each  Sidestepping hip hikes x10 steps each direction Forward step ups 6 inch box x15 B U UE support  Squats holding onto bar x12 cues for form cues to minimize pulling with UEs   NMR  Tandem stance blue foam pad 3x30 seconds B  SLS with one foot on 6 inch box 3x30 seconds B solid surface  3 way toe taps on blue foam pad x5 B one finger support  EC standing on blue foam pad 3x30 seconds    03/26/22  TherEx  Nustep L4 x6 minutes BLEs only Sidelying clams red TB x15 B Supine hip ABD red TB x12 B STS x15 red TB above knees  Hip hikes x12 B Hip hikes + ABD x12 B Forward step ups on 6 inch box x10 B U UE support  Lateral step ups on 6  inch box x10 B U UE support   NMR  Narrow BOS 3x30 seconds EO blue foam pad  Lateral toe taps off edge of blue foam pad x15 B Forward step up/on blue foam pad x10 forward/x10 backward  Tandem stance on blue foam pad 1x30 seconds B  Eval  Objective measures + appropriate education  Bridges with ABD into red TB x5  Standing hip ABD red TB x5 Tandem stance 1x10 seconds       PATIENT EDUCATION:  Education details: exercise form/purpose  Person educated: Patient Education method: Consulting civil engineer, Media planner, and Handouts Education comprehension: verbalized understanding, returned demonstration, and needs further education  HOME EXERCISE PROGRAM:  Access Code: [place] URL: [email or web address] Date: 03/28/2022 Prepared by: Deniece Ree  Exercises - Hip Abduction with Resistance Loop  - 2 x daily - 7 x weekly - 1 sets - 10 reps - 1 hold - Tandem Stance  - 2 x daily - 7 x weekly - 1 sets - 3 reps - 15-30 hold - Sit to Stand with Resistance Around Legs  - 2 x daily - 7 x weekly - 1 sets - 10 reps - Standing Hip Hiking  - 1 x daily - 7 x weekly - 3 sets - 10 reps - Single Leg Stance  - 1 x daily - 7 x weekly - 3 sets - 10 reps  ASSESSMENT:  CLINICAL IMPRESSION:  Gertha arrives doing well today, checked STGs then continued to  work on functional strength and balance today. Seems to be making steady progress. Will continue to progress as able and tolerated.   OBJECTIVE IMPAIRMENTS: Abnormal gait, decreased activity tolerance, decreased balance, decreased coordination, decreased mobility, difficulty walking, decreased strength, and postural dysfunction.   ACTIVITY LIMITATIONS: standing, stairs, transfers, and locomotion level  PARTICIPATION LIMITATIONS: shopping, community activity, yard work, and church  PERSONAL FACTORS: Fitness, Social background, and Time since onset of injury/illness/exacerbation are also affecting patient's functional outcome.   REHAB  POTENTIAL: Good  CLINICAL DECISION MAKING: Stable/uncomplicated  EVALUATION COMPLEXITY: Low   GOALS: Goals reviewed with patient? Yes  SHORT TERM GOALS: Target date: 04/02/2022    Will be compliant with appropriate progressive HEP  Baseline: Goal status: MET 12/29  2.  Will be able to name 3 ways to prevent falls at home and in the community  Baseline:  Goal status: IN PROGRESS 12/29-  needs ongoing education     LONG TERM GOALS: Target date: 04/23/2022    MMT to improve by 1 grade in all weak groups  Baseline:  Goal status: IN PROGRESS 12/29  2.  Will be able to ambulate at least 663f in 3MWT to show improved mobility and community access  Baseline:  Goal status: IN PROGRESS 12/29  3.  Will be able to ambulate community distances with no L/R drift and no feelings of unsteadiness Baseline:  Goal status: MET 12/29  4.  Will score at least 20 on DGI to show improved functional balance skills  Baseline:  Goal status: INITIAL  5.  Will be able to navigate at least 5 steps with no rail and step over step pattern with minimal unsteadiness to show improved balance and community access  Baseline:  Goal status: INITIAL  6.  Will be compliant with appropriate gym based exercise program to maintain functional gains and prevent recurrence of condition  Baseline:  Goal status: INITIAL  PLAN:  PT FREQUENCY: 2x/week  PT DURATION: 6 weeks  PLANNED INTERVENTIONS: Therapeutic exercises, Therapeutic activity, Neuromuscular re-education, Balance training, Gait training, Patient/Family education, Self Care, Joint mobilization, Stair training, DME instructions, Aquatic Therapy, Spinal mobilization, Cryotherapy, Moist heat, Taping, Ultrasound, Ionotophoresis 425mml Dexamethasone, Manual therapy, and Re-evaluation.  PLAN FOR NEXT SESSION: balance and strength focus, HEP updates PRN. Need to work on things like hurdles that force her to clear obstacles    Amarah Brossman U PT DPT PN2   04/05/2022, 2:27 PM

## 2022-04-09 ENCOUNTER — Ambulatory Visit (HOSPITAL_BASED_OUTPATIENT_CLINIC_OR_DEPARTMENT_OTHER): Payer: PPO | Attending: Internal Medicine | Admitting: Physical Therapy

## 2022-04-09 ENCOUNTER — Encounter (HOSPITAL_BASED_OUTPATIENT_CLINIC_OR_DEPARTMENT_OTHER): Payer: Self-pay | Admitting: Physical Therapy

## 2022-04-09 DIAGNOSIS — M6281 Muscle weakness (generalized): Secondary | ICD-10-CM | POA: Diagnosis not present

## 2022-04-09 DIAGNOSIS — R262 Difficulty in walking, not elsewhere classified: Secondary | ICD-10-CM | POA: Insufficient documentation

## 2022-04-09 DIAGNOSIS — Z9181 History of falling: Secondary | ICD-10-CM | POA: Insufficient documentation

## 2022-04-09 DIAGNOSIS — R2681 Unsteadiness on feet: Secondary | ICD-10-CM | POA: Insufficient documentation

## 2022-04-09 NOTE — Therapy (Unsigned)
OUTPATIENT PHYSICAL THERAPY THORACOLUMBAR TREATMENT   Patient Name: Diana Stout MRN: [id number] DOB:29-Apr-1930, 89 y.o., female Today's Date: 04/09/2022  END OF SESSION:  PT End of Session - 04/09/22 1351     Visit Number 6    Number of Visits 13    Date for PT Re-Evaluation 04/23/22    Authorization Type HTA    Authorization Time Period 03/12/22 to 05/07/22    PT Start Time 1346    PT Stop Time 1428    PT Time Calculation (min) 42 min    Activity Tolerance Patient tolerated treatment well    Behavior During Therapy [facility] for tasks assessed/performed                 Past Medical History:  Diagnosis Date   Abdominal pain, lower 11/22/2010   2015 resolved off Zocor 5/17?urticaria w/GI upset: Dtr is allergic to seafood - the pt is on MegaRed krill oil - d/c MegaRed    Abnormal chest CT 05/22/2011   Followed in Pulmonary clinic/ [facility]/ [place]  05/14/11 CT CHEST WITHOUT CONTRAST 1. Multiple small pulmonary nodules scattered throughout the lungs  bilaterally ranging in size from 3-6 mm.    2. There is a small hiatal hernia.  3. Atherosclerosis.  4. Status post cholecystectomy.  5. Low attenuation hepatic lesions unchanged compared to recent  abdominal CT scan, likely to represent cysts   ANEMIA, NORMOCYTIC 08/10/2009   mild     DIVERTICULOSIS, COLON 03/08/2007   Qualifier: Diagnosis of  By: [name] MD, [name]    Dizziness 07/19/2013   Likely due to diarrhea/meds 4/15    DVT (deep venous thrombosis) ([place]) 1998   hx of right   Dyslipidemia 02/02/2007   Chronic 1/13 worse     Dyspnea 07/01/2008   2/13 improved w/reg exercise 1/17 deconditioning - CL stress test was nl     Edema 09/27/2009   Qualifier: Diagnosis of  By: [name], MD, [name]    ESOPHAGEAL STRICTURE 03/06/2007   Qualifier: Diagnosis of  By: [name] MD, [name]    Esophagitis 2006   Essential hypertension 03/08/2007   Chronic  On Benicar    FOOT PAIN 07/25/2009   R foot OA     GERD 02/02/2007    Wedge pillow Protonix prn, Pepcid - d/c   History of pneumonia 12/2016   History of shingles    Hyperlipidemia    IBS 03/06/2007   Qualifier: Diagnosis of  By: [name] MD, [name]    Impaired glucose tolerance 11/22/2010   Kidney cysts    left- Dr [name]   Lung nodule 05/08/2011   04/15/11 RLL 4 mm nodule on abd CT - [facility] office    Obesity 01/03/2015   Osteoporosis 02/02/2007   Chronic  Vit D, yoga   Palpitations 05/12/2014   Dr [name] 2/16 poss due to steroid pack    Rash and nonspecific skin eruption 09/12/2015   5/17?urticaria Pt is allergic to seafood - the pt is on MegaRed krill oil - d/c MegaRed    Upper airway cough syndrome 12/08/2014   Dr [name] Flutter valve added 01/02/2015 > resolved 02/03/2015  - rec try off gerd rx p 03/09/15    Past Surgical History:  Procedure Laterality Date   APPENDECTOMY     CHOLECYSTECTOMY     HYSTEROSCOPY WITH RESECTOSCOPE  7/99   resect polyp   MOHS SURGERY  05/2018   face   TUBAL LIGATION Bilateral    Patient  Active Problem List   Diagnosis Date Noted   Hyperkalemia 06/10/2021   Chronic bronchitis ([place]) 05/30/2021   Lacunar stroke ([place]) 03/08/2021   Prediabetes 01/02/2021   History of CVA (cerebrovascular accident) without residual deficits 01/01/2021   Balance problem 11/29/2020   Acute cystitis with hematuria 01/31/2020   Recurrent UTI 01/31/2020   Low back pain 10/13/2019   CAD (coronary artery disease) 03/04/2018   Lightheadedness 11/05/2017   Canker sore 01/01/2017   Kidney cyst, acquired 07/22/2016   Rash and nonspecific skin eruption 09/12/2015   Obesity 01/03/2015   Upper airway cough syndrome 12/08/2014   Right-sided thoracic back pain 05/12/2014   Palpitations 05/12/2014   Snoring 12/22/2013   Diarrhea 07/19/2013   Dizziness 07/19/2013   Fatigue 10/15/2011   Impaired glucose tolerance 11/22/2010   Well adult exam 11/22/2010   Abdominal pain, lower 11/22/2010   SYNCOPE 09/27/2009   Edema 09/27/2009   Recurrent pneumonia  08/15/2009   ANEMIA, NORMOCYTIC 08/10/2009   SKIN RASH 08/10/2009   FOOT PAIN 07/25/2009   Dyspnea 07/01/2008   Acute bronchitis 06/12/2007   Essential hypertension 03/08/2007   DIVERTICULOSIS, COLON 03/08/2007   ESOPHAGEAL STRICTURE 03/06/2007   IBS 03/06/2007   SHINGLES, HX OF 03/06/2007   Dyslipidemia 02/02/2007   GERD 02/02/2007   Osteoporosis 02/02/2007   DVT, HX OF 02/02/2007    PCP: Plotnikov, [name], MD  REFERRING PROVIDER: [name], MD  REFERRING DIAG:  M54.50,G89.29 (ICD-10-CM) - Chronic right-sided low back pain without sciatica  R26.9 (ICD-10-CM) - Gait disorder    Rationale for Evaluation and Treatment: Rehabilitation  THERAPY DIAG:  Muscle weakness (generalized)  History of falling  Unsteadiness on feet  Difficulty in walking, not elsewhere classified  ONSET DATE: 01/14/2022  SUBJECTIVE:                                                                                                                                                                                           SUBJECTIVE STATEMENT:  The pateitnt has no complaints. She has had no back issues for a few days now.   PERTINENT HISTORY:    PAIN:  Are you having pain? No: NPRS scale: 0/10  PRECAUTIONS: Fall, very HOH   WEIGHT BEARING RESTRICTIONS: No  FALLS:  Has patient fallen in last 6 months? Yes. Number of falls 1 on the 17th; (+) FOF  LIVING ENVIRONMENT: Lives with: lives alone but has a daughter that lives 3 minutes away  Lives in: House/apartment Stairs: 5STE with L ascending rail, no steps inside the home but does have threshold step to get into home  Has following equipment at home:  Single point cane  OCCUPATION: retired   PLOF: Independent  PATIENT GOALS: want to walk straight and not keel over   NEXT MD VISIT: referring in 5-6 months   OBJECTIVE:   DIAGNOSTIC FINDINGS:  Per pt had xrays done on the 16th, low back and hips looked good but have some  arthritis in the back   PATIENT SURVEYS:  FOTO will do 2nd session  SCREENING FOR RED FLAGS: Bowel or bladder incontinence: Yes: bladder incontinence for 5 years, not associated with back pain  Spinal tumors: No Cauda equina syndrome: No Compression fracture: No Abdominal aneurysm: No  COGNITION: Overall cognitive status: Within functional limits for tasks assessed     SENSATION: WFL Not tested  MUSCLE LENGTH:   POSTURE: rounded shoulders, forward head, decreased lumbar lordosis, and increased thoracic kyphosis  PALPATION:   LUMBAR ROM:   AROM eval  Flexion   Extension   Right lateral flexion   Left lateral flexion   Right rotation   Left rotation    (Blank rows = not tested)  LOWER EXTREMITY ROM:     Active  Right eval Left eval  Hip flexion    Hip extension    Hip abduction    Hip adduction    Hip internal rotation    Hip external rotation    Knee flexion    Knee extension    Ankle dorsiflexion    Ankle plantarflexion    Ankle inversion    Ankle eversion     (Blank rows = not tested)  LOWER EXTREMITY MMT:    MMT Right eval Left eval R 12/29 L 12/29  Hip flexion _0 4-  Hip extension      Hip abduction 3+ 3 3+ 4  Hip adduction      Hip internal rotation      Hip external rotation      Knee flexion 5 4 4+ 4+  Knee extension _1 4+  Ankle dorsiflexion 5 5    Ankle plantarflexion      Ankle inversion      Ankle eversion       (Blank rows = not tested)  LUMBAR SPECIAL TESTS:    FUNCTIONAL TESTS:  5 times sit to stand: 14 seconds no UEs  3 minute walk test: 45f no rest RPE 4/10 Dynamic Gait Index: 14/24  GAIT: Distance walked: in clinic distances  Assistive device utilized: None Level of assistance: Complete Independence Comments: flexed at hips, short step lengths, stance times, L/R drift increasing with fatigue, + trendelenburg. Gait speed 2.7 ft/sec   TODAY'S TREATMENT:                                                                                                                               DATE:  1/2  Air-ex:  Step on and off x20  Lateral step on and off x20  Heel/toe rcok x20  Mini squats on air-ex  no UE support 2x10  Narrow base EO and EC min a with EC 2x20 sec hold   Seated:   LAQ red 2x15  Seated march 2x15  Hip abduction 2x15  Nu-step 5 min L3        04/05/22  Objective tests/measures, goal review + appropriate education  3MWT 567f, no rest RPE 5/10  TherEx  Nustep L4x6 minutes BLEs only Hip hikes + side step 2 rounds B  NMR  Tandem stance blue pad 3x30 seconds  Tandem stance solid surface with vertical and horizontal head turns 4 rounds  Forward step overs/step backs while standing on blue foam pad focus on foot clearance x10 B    04/03/22  TherEx  Nustep L4x6 minutes BLEs only  Sidestepping hip hikes 2x10 steps each direction Hip hikes with swing x15 B 3 way hip with green TB 1x10 B Squats holding onto bar x15 cues for form cues to minimize pulling with UEs   NMR  Alternating cone taps on blue foam pad x20 intermittent U UE support  Cross midline cone taps from solid surface x10 B light U UE support  Forward step overs 4 inch hurdles x3 laps down and back  Sideways step overs 4 inch hurdles x3 laps down and back  Walking with eyes shut, occasional cues to correct L/R drift   03/28/22  TherEx  Nustep L4x6 minutes BLEs only  Sidelying hip ABD x15 B red TB  Sidelying hip hikes x10 B  Sidelying hip hikes + CW/CCW circles x10 each  Sidestepping hip hikes x10 steps each direction Forward step ups 6 inch box x15 B U UE support  Squats holding onto bar x12 cues for form cues to minimize pulling with UEs   NMR  Tandem stance blue foam pad 3x30 seconds B  SLS with one foot on 6 inch box 3x30 seconds B solid surface  3 way toe taps on blue foam pad x5 B one finger support  EC standing on blue foam pad 3x30 seconds     PATIENT EDUCATION:  Education  details: exercise form/purpose  Person educated: Patient Education method: EConsulting civil engineer DMedia planner and Handouts Education comprehension: verbalized understanding, returned demonstration, and needs further education  HOME EXERCISE PROGRAM:  Access Code: [email or web address] URL: [email or web address] Date: 03/28/2022 Prepared by: KDeniece Ree Exercises - Hip Abduction with Resistance Loop  - 2 x daily - 7 x weekly - 1 sets - 10 reps - 1 hold - Tandem Stance  - 2 x daily - 7 x weekly - 1 sets - 3 reps - 15-30 hold - Sit to Stand with Resistance Around Legs  - 2 x daily - 7 x weekly - 1 sets - 10 reps - Standing Hip Hiking  - 1 x daily - 7 x weekly - 3 sets - 10 reps - Single Leg Stance  - 1 x daily - 7 x weekly - 3 sets - 10 reps  ASSESSMENT:  CLINICAL IMPRESSION:  The patient tolerated treatment well. She had no significant increase in pain in her back. We continue to work on challenging her balance. She only really required assist for eyes closed narrow base on air-ex. We also worked on cBuilding services engineer She is good at safely challenging herself. We reviewed where to get a balance mat for home use.  OBJECTIVE IMPAIRMENTS: Abnormal gait, decreased activity tolerance, decreased balance, decreased coordination, decreased mobility, difficulty walking, decreased strength, and postural dysfunction.   ACTIVITY LIMITATIONS: standing, stairs, transfers, and locomotion level  PARTICIPATION LIMITATIONS: shopping, community activity, yard work, and church  PERSONAL FACTORS: Fitness, Social background, and Time since onset of injury/illness/exacerbation are also affecting patient's functional outcome.   REHAB POTENTIAL: Good  CLINICAL DECISION MAKING: Stable/uncomplicated  EVALUATION COMPLEXITY: Low   GOALS: Goals reviewed with patient? Yes  SHORT TERM GOALS: Target date: 04/02/2022    Will be compliant with appropriate progressive HEP  Baseline: Goal status: MET  12/29  2.  Will be able to name 3 ways to prevent falls at home and in the community  Baseline:  Goal status: IN PROGRESS 12/29-  needs ongoing education     LONG TERM GOALS: Target date: 04/23/2022    MMT to improve by 1 grade in all weak groups  Baseline:  Goal status: IN PROGRESS 12/29  2.  Will be able to ambulate at least 636f in 3MWT to show improved mobility and community access  Baseline:  Goal status: IN PROGRESS 12/29  3.  Will be able to ambulate community distances with no L/R drift and no feelings of unsteadiness Baseline:  Goal status: MET 12/29  4.  Will score at least 20 on DGI to show improved functional balance skills  Baseline:  Goal status: INITIAL  5.  Will be able to navigate at least 5 steps with no rail and step over step pattern with minimal unsteadiness to show improved balance and community access  Baseline:  Goal status: INITIAL  6.  Will be compliant with appropriate gym based exercise program to maintain functional gains and prevent recurrence of condition  Baseline:  Goal status: INITIAL  PLAN:  PT FREQUENCY: 2x/week  PT DURATION: 6 weeks  PLANNED INTERVENTIONS: Therapeutic exercises, Therapeutic activity, Neuromuscular re-education, Balance training, Gait training, Patient/Family education, Self Care, Joint mobilization, Stair training, DME instructions, Aquatic Therapy, Spinal mobilization, Cryotherapy, Moist heat, Taping, Ultrasound, Ionotophoresis 431mml Dexamethasone, Manual therapy, and Re-evaluation.  PLAN FOR NEXT SESSION: balance and strength focus, HEP updates PRN. Need to work on things like hurdles that force her to clear obstacles    DaCarolyne LittlesT DPT  04/09/2022, 1:56 PM

## 2022-04-10 ENCOUNTER — Encounter (HOSPITAL_BASED_OUTPATIENT_CLINIC_OR_DEPARTMENT_OTHER): Payer: Self-pay | Admitting: Physical Therapy

## 2022-04-11 ENCOUNTER — Encounter (HOSPITAL_BASED_OUTPATIENT_CLINIC_OR_DEPARTMENT_OTHER): Payer: PPO | Admitting: Physical Therapy

## 2022-04-12 ENCOUNTER — Ambulatory Visit (HOSPITAL_BASED_OUTPATIENT_CLINIC_OR_DEPARTMENT_OTHER): Payer: PPO | Admitting: Physical Therapy

## 2022-04-15 ENCOUNTER — Ambulatory Visit
Admission: RE | Admit: 2022-04-15 | Discharge: 2022-04-15 | Disposition: A | Discharge status: Home / Self Care | Payer: PPO | Source: Ambulatory Visit | Attending: Obstetrics & Gynecology | Admitting: Obstetrics & Gynecology

## 2022-04-15 DIAGNOSIS — Z0389 Encounter for observation for other suspected diseases and conditions ruled out: Secondary | ICD-10-CM | POA: Diagnosis not present

## 2022-04-15 DIAGNOSIS — R599 Enlarged lymph nodes, unspecified: Secondary | ICD-10-CM

## 2022-04-16 ENCOUNTER — Ambulatory Visit (HOSPITAL_BASED_OUTPATIENT_CLINIC_OR_DEPARTMENT_OTHER): Payer: PPO | Admitting: Physical Therapy

## 2022-04-18 ENCOUNTER — Encounter (HOSPITAL_BASED_OUTPATIENT_CLINIC_OR_DEPARTMENT_OTHER): Payer: PPO | Admitting: Physical Therapy

## 2022-04-19 ENCOUNTER — Encounter (HOSPITAL_BASED_OUTPATIENT_CLINIC_OR_DEPARTMENT_OTHER): Payer: Self-pay | Admitting: Physical Therapy

## 2022-04-19 ENCOUNTER — Ambulatory Visit (HOSPITAL_BASED_OUTPATIENT_CLINIC_OR_DEPARTMENT_OTHER): Payer: PPO | Admitting: Physical Therapy

## 2022-04-19 DIAGNOSIS — R2681 Unsteadiness on feet: Secondary | ICD-10-CM

## 2022-04-19 DIAGNOSIS — M6281 Muscle weakness (generalized): Secondary | ICD-10-CM | POA: Diagnosis not present

## 2022-04-19 DIAGNOSIS — R262 Difficulty in walking, not elsewhere classified: Secondary | ICD-10-CM

## 2022-04-19 DIAGNOSIS — Z9181 History of falling: Secondary | ICD-10-CM

## 2022-04-19 NOTE — Therapy (Signed)
OUTPATIENT PHYSICAL THERAPY THORACOLUMBAR TREATMENT   Patient Name: Diana Stout MRN: [id number] DOB:1930-04-27, 89 y.o., female Today's Date: 04/19/2022  END OF SESSION:  PT End of Session - 04/19/22 1354     Visit Number 7    Number of Visits 13    Date for PT Re-Evaluation 04/23/22    Authorization Type HTA    Authorization Time Period 03/12/22 to 05/07/22    Progress Note Due on Visit 10    PT Start Time 1347    PT Stop Time 1426    PT Time Calculation (min) 39 min    Activity Tolerance Patient tolerated treatment well    Behavior During Therapy [facility] for tasks assessed/performed                  Past Medical History:  Diagnosis Date   Abdominal pain, lower 11/22/2010   2015 resolved off Zocor 5/17?urticaria w/GI upset: Dtr is allergic to seafood - the pt is on MegaRed krill oil - d/c MegaRed    Abnormal chest CT 05/22/2011   Followed in Pulmonary clinic/ [facility]/ [place]  05/14/11 CT CHEST WITHOUT CONTRAST 1. Multiple small pulmonary nodules scattered throughout the lungs  bilaterally ranging in size from 3-6 mm.    2. There is a small hiatal hernia.  3. Atherosclerosis.  4. Status post cholecystectomy.  5. Low attenuation hepatic lesions unchanged compared to recent  abdominal CT scan, likely to represent cysts   ANEMIA, NORMOCYTIC 08/10/2009   mild     DIVERTICULOSIS, COLON 03/08/2007   Qualifier: Diagnosis of  By: [name] MD, [name]    Dizziness 07/19/2013   Likely due to diarrhea/meds 4/15    DVT (deep venous thrombosis) ([place]) 1998   hx of right   Dyslipidemia 02/02/2007   Chronic 1/13 worse     Dyspnea 07/01/2008   2/13 improved w/reg exercise 1/17 deconditioning - CL stress test was nl     Edema 09/27/2009   Qualifier: Diagnosis of  By: [name], MD, [name]    ESOPHAGEAL STRICTURE 03/06/2007   Qualifier: Diagnosis of  By: [name] MD, [name]    Esophagitis 2006   Essential hypertension 03/08/2007   Chronic  On Benicar    FOOT PAIN 07/25/2009    R foot OA     GERD 02/02/2007   Wedge pillow Protonix prn, Pepcid - d/c   History of pneumonia 12/2016   History of shingles    Hyperlipidemia    IBS 03/06/2007   Qualifier: Diagnosis of  By: [name] MD, [name]    Impaired glucose tolerance 11/22/2010   Kidney cysts    left- Dr [name]   Lung nodule 05/08/2011   04/15/11 RLL 4 mm nodule on abd CT - [facility] office    Obesity 01/03/2015   Osteoporosis 02/02/2007   Chronic  Vit D, yoga   Palpitations 05/12/2014   Dr [name] 2/16 poss due to steroid pack    Rash and nonspecific skin eruption 09/12/2015   5/17?urticaria Pt is allergic to seafood - the pt is on MegaRed krill oil - d/c MegaRed    Upper airway cough syndrome 12/08/2014   Dr [name] Flutter valve added 01/02/2015 > resolved 02/03/2015  - rec try off gerd rx p 03/09/15    Past Surgical History:  Procedure Laterality Date   APPENDECTOMY     CHOLECYSTECTOMY     HYSTEROSCOPY WITH RESECTOSCOPE  7/99   resect polyp   MOHS SURGERY  05/2018  face   TUBAL LIGATION Bilateral    Patient Active Problem List   Diagnosis Date Noted   Hyperkalemia 06/10/2021   Chronic bronchitis ([place]) 05/30/2021   Lacunar stroke ([place]) 03/08/2021   Prediabetes 01/02/2021   History of CVA (cerebrovascular accident) without residual deficits 01/01/2021   Balance problem 11/29/2020   Acute cystitis with hematuria 01/31/2020   Recurrent UTI 01/31/2020   Low back pain 10/13/2019   CAD (coronary artery disease) 03/04/2018   Lightheadedness 11/05/2017   Canker sore 01/01/2017   Kidney cyst, acquired 07/22/2016   Rash and nonspecific skin eruption 09/12/2015   Obesity 01/03/2015   Upper airway cough syndrome 12/08/2014   Right-sided thoracic back pain 05/12/2014   Palpitations 05/12/2014   Snoring 12/22/2013   Diarrhea 07/19/2013   Dizziness 07/19/2013   Fatigue 10/15/2011   Impaired glucose tolerance 11/22/2010   Well adult exam 11/22/2010   Abdominal pain, lower 11/22/2010   SYNCOPE 09/27/2009    Edema 09/27/2009   Recurrent pneumonia 08/15/2009   ANEMIA, NORMOCYTIC 08/10/2009   SKIN RASH 08/10/2009   FOOT PAIN 07/25/2009   Dyspnea 07/01/2008   Acute bronchitis 06/12/2007   Essential hypertension 03/08/2007   DIVERTICULOSIS, COLON 03/08/2007   ESOPHAGEAL STRICTURE 03/06/2007   IBS 03/06/2007   SHINGLES, HX OF 03/06/2007   Dyslipidemia 02/02/2007   GERD 02/02/2007   Osteoporosis 02/02/2007   DVT, HX OF 02/02/2007    PCP: [name], MD  REFERRING PROVIDER: [name], MD  REFERRING DIAG:  M54.50,G89.29 (ICD-10-CM) - Chronic right-sided low back pain without sciatica  R26.9 (ICD-10-CM) - Gait disorder    Rationale for Evaluation and Treatment: Rehabilitation  THERAPY DIAG:  Muscle weakness (generalized)  History of falling  Unsteadiness on feet  Difficulty in walking, not elsewhere classified  ONSET DATE: 01/14/2022  SUBJECTIVE:                                                                                                                                                                                           SUBJECTIVE STATEMENT:  I fell on Friday night, my foot slipped out from under me in the kitchen, I fell on my right knee and grabbed the oven and it kept me from going all the way down to the floor. I was able to pull myself up. I checked myself out I didn't go to the doctor.   PERTINENT HISTORY:    PAIN:  Are you having pain? Yes: NPRS scale: 3/10 Location: R lumbar spine Type: ache Aggravating factors: activity Relieving factors: heat and tylenol   PRECAUTIONS: Fall, very HOH   WEIGHT BEARING RESTRICTIONS: No  FALLS:  Has patient fallen in last 6 months? Yes. Number of falls 1 on the 17th; (+) FOF  LIVING ENVIRONMENT: Lives with: lives alone but has a daughter that lives 3 minutes away  Lives in: House/apartment Stairs: 5STE with L ascending rail, no steps inside the home but does have threshold step to get into home   Has following equipment at home: Single point cane  OCCUPATION: retired   PLOF: Independent  PATIENT GOALS: want to walk straight and not keel over   NEXT MD VISIT: referring in 5-6 months   OBJECTIVE:   DIAGNOSTIC FINDINGS:  Per pt had xrays done on the 16th, low back and hips looked good but have some arthritis in the back   PATIENT SURVEYS:  FOTO will do 2nd session  SCREENING FOR RED FLAGS: Bowel or bladder incontinence: Yes: bladder incontinence for 5 years, not associated with back pain  Spinal tumors: No Cauda equina syndrome: No Compression fracture: No Abdominal aneurysm: No  COGNITION: Overall cognitive status: Within functional limits for tasks assessed     SENSATION: WFL Not tested  MUSCLE LENGTH:   POSTURE: rounded shoulders, forward head, decreased lumbar lordosis, and increased thoracic kyphosis  PALPATION:   LUMBAR ROM:   AROM eval  Flexion   Extension   Right lateral flexion   Left lateral flexion   Right rotation   Left rotation    (Blank rows = not tested)  LOWER EXTREMITY ROM:     Active  Right eval Left eval  Hip flexion    Hip extension    Hip abduction    Hip adduction    Hip internal rotation    Hip external rotation    Knee flexion    Knee extension    Ankle dorsiflexion    Ankle plantarflexion    Ankle inversion    Ankle eversion     (Blank rows = not tested)  LOWER EXTREMITY MMT:    MMT Right eval Left eval R 12/29 L 12/29  Hip flexion '3 3 4 '$ 4-  Hip extension      Hip abduction 3+ 3 3+ 4  Hip adduction      Hip internal rotation      Hip external rotation      Knee flexion 5 4 4+ 4+  Knee extension '5 4 5 '$ 4+  Ankle dorsiflexion 5 5    Ankle plantarflexion      Ankle inversion      Ankle eversion       (Blank rows = not tested)  LUMBAR SPECIAL TESTS:    FUNCTIONAL TESTS:  5 times sit to stand: 14 seconds no UEs  3 minute walk test: 474f no rest RPE 4/10 Dynamic Gait Index:  14/24  GAIT: Distance walked: in clinic distances  Assistive device utilized: None Level of assistance: Complete Independence Comments: flexed at hips, short step lengths, stance times, L/R drift increasing with fatigue, + trendelenburg. Gait speed 2.7 ft/sec   TODAY'S TREATMENT:  DATE:   04/19/22  TherEx  Nustep L3 x6 minutes BLEs only SKTC x5 B with 5 second holds Lumbar rotation stretch 5x5 second holds B  Seated LAQs green TB x10 B Seated hip ABD x10 B green TB  Seated marches x10 B green TB  HS stretches 1x30 seconds B Piriformis stretches 1x30 seconds B 3D hip excursions x10 B all directions  Tandem stance 3x30 seconds B blue foam pad         1/2  Air-ex:  Step on and off x20  Lateral step on and off x20  Heel/toe rcok x20  Mini squats on air-ex no UE support 2x10  Narrow base EO and EC min a with EC 2x20 sec hold   Seated:   LAQ red 2x15  Seated march 2x15  Hip abduction 2x15  Nu-step 5 min L3        04/05/22  Objective tests/measures, goal review + appropriate education  3MWT 511f, no rest RPE 5/10  TherEx  Nustep L4x6 minutes BLEs only Hip hikes + side step 2 rounds B  NMR  Tandem stance blue pad 3x30 seconds  Tandem stance solid surface with vertical and horizontal head turns 4 rounds  Forward step overs/step backs while standing on blue foam pad focus on foot clearance x10 B    04/03/22  TherEx  Nustep L4x6 minutes BLEs only  Sidestepping hip hikes 2x10 steps each direction Hip hikes with swing x15 B 3 way hip with green TB 1x10 B Squats holding onto bar x15 cues for form cues to minimize pulling with UEs   NMR  Alternating cone taps on blue foam pad x20 intermittent U UE support  Cross midline cone taps from solid surface x10 B light U UE support  Forward step overs 4 inch hurdles x3 laps down and  back  Sideways step overs 4 inch hurdles x3 laps down and back  Walking with eyes shut, occasional cues to correct L/R drift   03/28/22  TherEx  Nustep L4x6 minutes BLEs only  Sidelying hip ABD x15 B red TB  Sidelying hip hikes x10 B  Sidelying hip hikes + CW/CCW circles x10 each  Sidestepping hip hikes x10 steps each direction Forward step ups 6 inch box x15 B U UE support  Squats holding onto bar x12 cues for form cues to minimize pulling with UEs   NMR  Tandem stance blue foam pad 3x30 seconds B  SLS with one foot on 6 inch box 3x30 seconds B solid surface  3 way toe taps on blue foam pad x5 B one finger support  EC standing on blue foam pad 3x30 seconds     PATIENT EDUCATION:  Education details: exercise form/purpose  Person educated: Patient Education method: EConsulting civil engineer DMedia planner and Handouts Education comprehension: verbalized understanding, returned demonstration, and needs further education  HOME EXERCISE PROGRAM:  Access Code: [email or web address] URL: [email or web address] Date: 03/28/2022 Prepared by: KDeniece Ree Exercises - Hip Abduction with Resistance Loop  - 2 x daily - 7 x weekly - 1 sets - 10 reps - 1 hold - Tandem Stance  - 2 x daily - 7 x weekly - 1 sets - 3 reps - 15-30 hold - Sit to Stand with Resistance Around Legs  - 2 x daily - 7 x weekly - 1 sets - 10 reps - Standing Hip Hiking  - 1 x daily - 7 x weekly - 3 sets - 10 reps - Single Leg Stance  - 1 x daily -  7 x weekly - 3 sets - 10 reps  ASSESSMENT:  CLINICAL IMPRESSION:  Diana Stout arrives today doing OK, she did have a major fall in the kitchen over the weekend, does not appear to have significant injury but did not go to her MD for work up. Moving more more stiffly today, incorporated some lumbar and hip/knee mobility to help reduce stiffness/soreness from her fall. Pain from her fall is improving, did educate that if it worsens she does need to get a formal w/u from  MD.  OBJECTIVE IMPAIRMENTS: Abnormal gait, decreased activity tolerance, decreased balance, decreased coordination, decreased mobility, difficulty walking, decreased strength, and postural dysfunction.   ACTIVITY LIMITATIONS: standing, stairs, transfers, and locomotion level  PARTICIPATION LIMITATIONS: shopping, community activity, yard work, and church  PERSONAL FACTORS: Fitness, Social background, and Time since onset of injury/illness/exacerbation are also affecting patient's functional outcome.   REHAB POTENTIAL: Good  CLINICAL DECISION MAKING: Stable/uncomplicated  EVALUATION COMPLEXITY: Low   GOALS: Goals reviewed with patient? Yes  SHORT TERM GOALS: Target date: 04/02/2022    Will be compliant with appropriate progressive HEP  Baseline: Goal status: MET 12/29  2.  Will be able to name 3 ways to prevent falls at home and in the community  Baseline:  Goal status: IN PROGRESS 12/29-  needs ongoing education     LONG TERM GOALS: Target date: 04/23/2022    MMT to improve by 1 grade in all weak groups  Baseline:  Goal status: IN PROGRESS 12/29  2.  Will be able to ambulate at least 671f in 3MWT to show improved mobility and community access  Baseline:  Goal status: IN PROGRESS 12/29  3.  Will be able to ambulate community distances with no L/R drift and no feelings of unsteadiness Baseline:  Goal status: MET 12/29  4.  Will score at least 20 on DGI to show improved functional balance skills  Baseline:  Goal status: INITIAL  5.  Will be able to navigate at least 5 steps with no rail and step over step pattern with minimal unsteadiness to show improved balance and community access  Baseline:  Goal status: INITIAL  6.  Will be compliant with appropriate gym based exercise program to maintain functional gains and prevent recurrence of condition  Baseline:  Goal status: INITIAL  PLAN:  PT FREQUENCY: 2x/week  PT DURATION: 6 weeks  PLANNED INTERVENTIONS:  Therapeutic exercises, Therapeutic activity, Neuromuscular re-education, Balance training, Gait training, Patient/Family education, Self Care, Joint mobilization, Stair training, DME instructions, Aquatic Therapy, Spinal mobilization, Cryotherapy, Moist heat, Taping, Ultrasound, Ionotophoresis '4mg'$ /ml Dexamethasone, Manual therapy, and Re-evaluation.  PLAN FOR NEXT SESSION: balance and strength focus, HEP updates PRN. Need to work on things like hurdles that force her to clear obstacles   KDeniece ReePT DPT PN2

## 2022-04-23 ENCOUNTER — Ambulatory Visit (HOSPITAL_BASED_OUTPATIENT_CLINIC_OR_DEPARTMENT_OTHER): Payer: PPO | Admitting: Physical Therapy

## 2022-04-23 ENCOUNTER — Encounter (HOSPITAL_BASED_OUTPATIENT_CLINIC_OR_DEPARTMENT_OTHER): Payer: Self-pay | Admitting: Physical Therapy

## 2022-04-23 DIAGNOSIS — R2681 Unsteadiness on feet: Secondary | ICD-10-CM

## 2022-04-23 DIAGNOSIS — R262 Difficulty in walking, not elsewhere classified: Secondary | ICD-10-CM

## 2022-04-23 DIAGNOSIS — Z9181 History of falling: Secondary | ICD-10-CM

## 2022-04-23 DIAGNOSIS — M6281 Muscle weakness (generalized): Secondary | ICD-10-CM | POA: Diagnosis not present

## 2022-04-23 NOTE — Therapy (Signed)
OUTPATIENT PHYSICAL THERAPY THORACOLUMBAR TREATMENT   Patient Name: Diana Stout MRN: [id number] DOB:03-16-1931, 89 y.o., female Today's Date: 04/19/2022  END OF SESSION:  PT End of Session - 04/19/22 1354     Visit Number 7    Number of Visits 13    Date for PT Re-Evaluation 04/23/22    Authorization Type HTA    Authorization Time Period 03/12/22 to 05/07/22    Progress Note Due on Visit 10    PT Start Time 1347    PT Stop Time 1426    PT Time Calculation (min) 39 min    Activity Tolerance Patient tolerated treatment well    Behavior During Therapy [facility] for tasks assessed/performed              Progress Note Reporting Period 03/13/2023 to 04/23/2022  See note below for Objective Data and Assessment of Progress/Goals.        Past Medical History:  Diagnosis Date   Abdominal pain, lower 11/22/2010   2015 resolved off Zocor 5/17?urticaria w/GI upset: Dtr is allergic to seafood - the pt is on MegaRed krill oil - d/c MegaRed    Abnormal chest CT 05/22/2011   Followed in Pulmonary clinic/ [facility]/ [place]  05/14/11 CT CHEST WITHOUT CONTRAST 1. Multiple small pulmonary nodules scattered throughout the lungs  bilaterally ranging in size from 3-6 mm.    2. There is a small hiatal hernia.  3. Atherosclerosis.  4. Status post cholecystectomy.  5. Low attenuation hepatic lesions unchanged compared to recent  abdominal CT scan, likely to represent cysts   ANEMIA, NORMOCYTIC 08/10/2009   mild     DIVERTICULOSIS, COLON 03/08/2007   Qualifier: Diagnosis of  By: [name] MD, [name]    Dizziness 07/19/2013   Likely due to diarrhea/meds 4/15    DVT (deep venous thrombosis) ([place]) 1998   hx of right   Dyslipidemia 02/02/2007   Chronic 1/13 worse     Dyspnea 07/01/2008   2/13 improved w/reg exercise 1/17 deconditioning - CL stress test was nl     Edema 09/27/2009   Qualifier: Diagnosis of  By: [name], MD, [name]    ESOPHAGEAL STRICTURE 03/06/2007   Qualifier:  Diagnosis of  By: [name] MD, [name]    Esophagitis 2006   Essential hypertension 03/08/2007   Chronic  On Benicar    FOOT PAIN 07/25/2009   R foot OA     GERD 02/02/2007   Wedge pillow Protonix prn, Pepcid - d/c   History of pneumonia 12/2016   History of shingles    Hyperlipidemia    IBS 03/06/2007   Qualifier: Diagnosis of  By: [name] MD, [name]    Impaired glucose tolerance 11/22/2010   Kidney cysts    left- Dr [name]   Lung nodule 05/08/2011   04/15/11 RLL 4 mm nodule on abd CT - [facility] office    Obesity 01/03/2015   Osteoporosis 02/02/2007   Chronic  Vit D, yoga   Palpitations 05/12/2014   Dr [name] 2/16 poss due to steroid pack    Rash and nonspecific skin eruption 09/12/2015   5/17?urticaria Pt is allergic to seafood - the pt is on MegaRed krill oil - d/c MegaRed    Upper airway cough syndrome 12/08/2014   Dr [name] Flutter valve added 01/02/2015 > resolved 02/03/2015  - rec try off gerd rx p 03/09/15    Past Surgical History:  Procedure Laterality Date   APPENDECTOMY     CHOLECYSTECTOMY  HYSTEROSCOPY WITH RESECTOSCOPE  7/99   resect polyp   MOHS SURGERY  05/2018   face   TUBAL LIGATION Bilateral    Patient Active Problem List   Diagnosis Date Noted   Hyperkalemia 06/10/2021   Chronic bronchitis ([place]) 05/30/2021   Lacunar stroke ([place]) 03/08/2021   Prediabetes 01/02/2021   History of CVA (cerebrovascular accident) without residual deficits 01/01/2021   Balance problem 11/29/2020   Acute cystitis with hematuria 01/31/2020   Recurrent UTI 01/31/2020   Low back pain 10/13/2019   CAD (coronary artery disease) 03/04/2018   Lightheadedness 11/05/2017   Canker sore 01/01/2017   Kidney cyst, acquired 07/22/2016   Rash and nonspecific skin eruption 09/12/2015   Obesity 01/03/2015   Upper airway cough syndrome 12/08/2014   Right-sided thoracic back pain 05/12/2014   Palpitations 05/12/2014   Snoring 12/22/2013   Diarrhea 07/19/2013   Dizziness 07/19/2013   Fatigue  10/15/2011   Impaired glucose tolerance 11/22/2010   Well adult exam 11/22/2010   Abdominal pain, lower 11/22/2010   SYNCOPE 09/27/2009   Edema 09/27/2009   Recurrent pneumonia 08/15/2009   ANEMIA, NORMOCYTIC 08/10/2009   SKIN RASH 08/10/2009   FOOT PAIN 07/25/2009   Dyspnea 07/01/2008   Acute bronchitis 06/12/2007   Essential hypertension 03/08/2007   DIVERTICULOSIS, COLON 03/08/2007   ESOPHAGEAL STRICTURE 03/06/2007   IBS 03/06/2007   SHINGLES, HX OF 03/06/2007   Dyslipidemia 02/02/2007   GERD 02/02/2007   Osteoporosis 02/02/2007   DVT, HX OF 02/02/2007    PCP: [name], MD  REFERRING PROVIDER: [name], MD  REFERRING DIAG:  M54.50,G89.29 (ICD-10-CM) - Chronic right-sided low back pain without sciatica  R26.9 (ICD-10-CM) - Gait disorder    Rationale for Evaluation and Treatment: Rehabilitation  THERAPY DIAG:  Muscle weakness (generalized)  History of falling  Unsteadiness on feet  Difficulty in walking, not elsewhere classified  ONSET DATE: 01/14/2022  SUBJECTIVE:                                                                                                                                                                                           SUBJECTIVE STATEMENT:  I fell on Friday night, my foot slipped out from under me in the kitchen, I fell on my right knee and grabbed the oven and it kept me from going all the way down to the floor. I was able to pull myself up. I checked myself out I didn't go to the doctor.   PERTINENT HISTORY:    PAIN:  Are you having pain? Yes: NPRS scale: 3/10 Location: R lumbar spine Type: ache Aggravating factors: activity Relieving factors:  heat and tylenol   PRECAUTIONS: Fall, very HOH   WEIGHT BEARING RESTRICTIONS: No  FALLS:  Has patient fallen in last 6 months? Yes. Number of falls 1 on the 17th; (+) FOF  LIVING ENVIRONMENT: Lives with: lives alone but has a daughter that lives 3  minutes away  Lives in: House/apartment Stairs: 5STE with L ascending rail, no steps inside the home but does have threshold step to get into home  Has following equipment at home: Single point cane  OCCUPATION: retired   PLOF: Independent  PATIENT GOALS: want to walk straight and not keel over   NEXT MD VISIT: referring in 5-6 months   OBJECTIVE:   DIAGNOSTIC FINDINGS:  Per pt had xrays done on the 16th, low back and hips looked good but have some arthritis in the back   PATIENT SURVEYS:  FOTO will do 2nd session  SCREENING FOR RED FLAGS: Bowel or bladder incontinence: Yes: bladder incontinence for 5 years, not associated with back pain  Spinal tumors: No Cauda equina syndrome: No Compression fracture: No Abdominal aneurysm: No  COGNITION: Overall cognitive status: Within functional limits for tasks assessed     SENSATION: WFL Not tested  MUSCLE LENGTH:   POSTURE: rounded shoulders, forward head, decreased lumbar lordosis, and increased thoracic kyphosis  PALPATION:   LUMBAR ROM:   AROM eval  Flexion   Extension   Right lateral flexion   Left lateral flexion   Right rotation   Left rotation    (Blank rows = not tested)  LOWER EXTREMITY ROM:     Active  Right eval Left eval  Hip flexion    Hip extension    Hip abduction    Hip adduction    Hip internal rotation    Hip external rotation    Knee flexion    Knee extension    Ankle dorsiflexion    Ankle plantarflexion    Ankle inversion    Ankle eversion     (Blank rows = not tested)  LOWER EXTREMITY MMT:    MMT Right eval Left eval R 12/29 L 12/29 R 1/16 L 1/16  Hip flexion '3 3 4 '$ 4- 4+ 4+  Hip extension        Hip abduction 3+ 3 3+ 4 4+ 4+  Hip adduction        Hip internal rotation        Hip external rotation        Knee flexion 5 4 4+ 4+    Knee extension '5 4 5 '$ 4+ 5 5  Ankle dorsiflexion 5 5      Ankle plantarflexion        Ankle inversion        Ankle eversion         (Blank  rows = not tested)  LUMBAR SPECIAL TESTS:    FUNCTIONAL TESTS:  5 times sit to stand: 14 seconds no UEs  3 minute walk test: 4109f no rest RPE 4/10 Dynamic Gait Index: 14/24  1/16 5x sit to stand 12 sec no UE use  3 minute walk test 550 no rest RPE 3/10  DGI: 16/24   GAIT: Distance walked: in clinic distances  Assistive device utilized: None Level of assistance: Complete Independence Comments: flexed at hips, short step lengths, stance times, L/R drift increasing with fatigue, + trendelenburg. Gait speed 2.7 ft/sec   TODAY'S TREATMENT:  DATE:   1/16  Performed balance and endurance testing with the patient. Reviewed goals and POC   Balance:  Airex: Narrow base of support 2 x 20-second hold contact-guard assistance Narrow base of support eyes closed 2 x 20-second hold min assist   Step on the Airex forward and lateral x 10 each bilateral  All other balance were performed during this balance testing  NuStep 5 minutes level 3   04/19/22    TherEx  Nustep L3 x6 minutes BLEs only SKTC x5 B with 5 second holds Lumbar rotation stretch 5x5 second holds B  Seated LAQs green TB x10 B Seated hip ABD x10 B green TB  Seated marches x10 B green TB  HS stretches 1x30 seconds B Piriformis stretches 1x30 seconds B 3D hip excursions x10 B all directions  Tandem stance 3x30 seconds B blue foam pad          PATIENT EDUCATION:  Education details: exercise form/purpose  Person educated: Patient Education method: Consulting civil engineer, Media planner, and Handouts Education comprehension: verbalized understanding, returned demonstration, and needs further education  HOME EXERCISE PROGRAM:  Access Code: [email or web address] URL: [email or web address] Date: 03/28/2022 Prepared by: Deniece Ree  Exercises - Hip Abduction with Resistance Loop  - 2 x  daily - 7 x weekly - 1 sets - 10 reps - 1 hold - Tandem Stance  - 2 x daily - 7 x weekly - 1 sets - 3 reps - 15-30 hold - Sit to Stand with Resistance Around Legs  - 2 x daily - 7 x weekly - 1 sets - 10 reps - Standing Hip Hiking  - 1 x daily - 7 x weekly - 3 sets - 10 reps - Single Leg Stance  - 1 x daily - 7 x weekly - 3 sets - 10 reps  ASSESSMENT:  CLINICAL IMPRESSION:  Therapy performed progress note on patient's today.  The patient made progression and strength, DGI score, 3-minute walk test score, and also was able to decrease her sit to stand time.  She had a fall 2 weeks ago although she did not have much pain.  She would benefit from continued skilled therapy 2 week 6 to continue to progress balance work.  Therapy focused on balance work today, she tolerated well.  Will continue to progress balance work as tolerated.   OBJECTIVE IMPAIRMENTS: Abnormal gait, decreased activity tolerance, decreased balance, decreased coordination, decreased mobility, difficulty walking, decreased strength, and postural dysfunction.   ACTIVITY LIMITATIONS: standing, stairs, transfers, and locomotion level  PARTICIPATION LIMITATIONS: shopping, community activity, yard work, and church  PERSONAL FACTORS: Fitness, Social background, and Time since onset of injury/illness/exacerbation are also affecting patient's functional outcome.   REHAB POTENTIAL: Good  CLINICAL DECISION MAKING: Stable/uncomplicated  EVALUATION COMPLEXITY: Low   GOALS: Goals reviewed with patient? Yes  SHORT TERM GOALS: Target date: 04/02/2022    Will be compliant with appropriate progressive HEP  Baseline: Goal status: MET 12/29  2.  Will be able to name 3 ways to prevent falls at home and in the community  Baseline:  Goal status: IN PROGRESS 12/29-  needs ongoing education     LONG TERM GOALS: Target date: 04/23/2022    MMT to improve by 1 grade in all weak groups  Baseline:  Goal status: IN PROGRESS 12/29  2.   Will be able to ambulate at least 666f in 3MWT to show improved mobility and community access  Baseline:  Goal status: IN PROGRESS 12/29  3.  Will be able  to ambulate community distances with no L/R drift and no feelings of unsteadiness Baseline:  Goal status: MET 12/29  4.  Will score at least 20 on DGI to show improved functional balance skills  Baseline:  Goal status: INITIAL  5.  Will be able to navigate at least 5 steps with no rail and step over step pattern with minimal unsteadiness to show improved balance and community access  Baseline:  Goal status: INITIAL  6.  Will be compliant with appropriate gym based exercise program to maintain functional gains and prevent recurrence of condition  Baseline:  Goal status: INITIAL  PLAN:  PT FREQUENCY: 2x/week  PT DURATION: 6 weeks  PLANNED INTERVENTIONS: Therapeutic exercises, Therapeutic activity, Neuromuscular re-education, Balance training, Gait training, Patient/Family education, Self Care, Joint mobilization, Stair training, DME instructions, Aquatic Therapy, Spinal mobilization, Cryotherapy, Moist heat, Taping, Ultrasound, Ionotophoresis '4mg'$ /ml Dexamethasone, Manual therapy, and Re-evaluation.  PLAN FOR NEXT SESSION: balance and strength focus, HEP updates PRN. Need to work on things like hurdles that force her to clear obstacles   Carolyne Littles PT DPT  04/23/2022

## 2022-04-25 ENCOUNTER — Encounter (HOSPITAL_BASED_OUTPATIENT_CLINIC_OR_DEPARTMENT_OTHER): Payer: PPO | Admitting: Physical Therapy

## 2022-04-26 ENCOUNTER — Ambulatory Visit (HOSPITAL_BASED_OUTPATIENT_CLINIC_OR_DEPARTMENT_OTHER): Payer: PPO | Admitting: Physical Therapy

## 2022-04-26 ENCOUNTER — Encounter (HOSPITAL_BASED_OUTPATIENT_CLINIC_OR_DEPARTMENT_OTHER): Payer: Self-pay | Admitting: Physical Therapy

## 2022-04-26 DIAGNOSIS — Z9181 History of falling: Secondary | ICD-10-CM

## 2022-04-26 DIAGNOSIS — R2681 Unsteadiness on feet: Secondary | ICD-10-CM

## 2022-04-26 DIAGNOSIS — R262 Difficulty in walking, not elsewhere classified: Secondary | ICD-10-CM

## 2022-04-26 DIAGNOSIS — M6281 Muscle weakness (generalized): Secondary | ICD-10-CM

## 2022-04-26 NOTE — Therapy (Signed)
OUTPATIENT PHYSICAL THERAPY THORACOLUMBAR TREATMENT   Patient Name: Diana Stout MRN: [id number] DOB:Dec 20, 1930, 89 y.o., female Today's Date: 04/26/2022  END OF SESSION:  PT End of Session - 04/26/22 1357     Visit Number 9    Number of Visits 20    Date for PT Re-Evaluation 06/04/22    Authorization Type HTA    Authorization Time Period 03/12/22 to 05/07/22    Progress Note Due on Visit 18    PT Start Time 1345    PT Stop Time 1425    PT Time Calculation (min) 40 min    Activity Tolerance Patient tolerated treatment well    Behavior During Therapy [facility] for tasks assessed/performed                      Past Medical History:  Diagnosis Date   Abdominal pain, lower 11/22/2010   2015 resolved off Zocor 5/17?urticaria w/GI upset: Dtr is allergic to seafood - the pt is on MegaRed krill oil - d/c MegaRed    Abnormal chest CT 05/22/2011   Followed in Pulmonary clinic/ [facility]/ [place]  05/14/11 CT CHEST WITHOUT CONTRAST 1. Multiple small pulmonary nodules scattered throughout the lungs  bilaterally ranging in size from 3-6 mm.    2. There is a small hiatal hernia.  3. Atherosclerosis.  4. Status post cholecystectomy.  5. Low attenuation hepatic lesions unchanged compared to recent  abdominal CT scan, likely to represent cysts   ANEMIA, NORMOCYTIC 08/10/2009   mild     DIVERTICULOSIS, COLON 03/08/2007   Qualifier: Diagnosis of  By: [name] MD, [name]    Dizziness 07/19/2013   Likely due to diarrhea/meds 4/15    DVT (deep venous thrombosis) ([place]) 1998   hx of right   Dyslipidemia 02/02/2007   Chronic 1/13 worse     Dyspnea 07/01/2008   2/13 improved w/reg exercise 1/17 deconditioning - CL stress test was nl     Edema 09/27/2009   Qualifier: Diagnosis of  By: [name], MD, [name]    ESOPHAGEAL STRICTURE 03/06/2007   Qualifier: Diagnosis of  By: [name] MD, [name]    Esophagitis 2006   Essential hypertension 03/08/2007   Chronic  On Benicar    FOOT PAIN  07/25/2009   R foot OA     GERD 02/02/2007   Wedge pillow Protonix prn, Pepcid - d/c   History of pneumonia 12/2016   History of shingles    Hyperlipidemia    IBS 03/06/2007   Qualifier: Diagnosis of  By: [name] MD, [name]    Impaired glucose tolerance 11/22/2010   Kidney cysts    left- Dr [name]   Lung nodule 05/08/2011   04/15/11 RLL 4 mm nodule on abd CT - [facility] office    Obesity 01/03/2015   Osteoporosis 02/02/2007   Chronic  Vit D, yoga   Palpitations 05/12/2014   Dr [name] 2/16 poss due to steroid pack    Rash and nonspecific skin eruption 09/12/2015   5/17?urticaria Pt is allergic to seafood - the pt is on MegaRed krill oil - d/c MegaRed    Upper airway cough syndrome 12/08/2014   Dr [name] Flutter valve added 01/02/2015 > resolved 02/03/2015  - rec try off gerd rx p 03/09/15    Past Surgical History:  Procedure Laterality Date   APPENDECTOMY     CHOLECYSTECTOMY     HYSTEROSCOPY WITH RESECTOSCOPE  7/99   resect polyp   MOHS SURGERY  05/2018   face   TUBAL LIGATION Bilateral    Patient Active Problem List   Diagnosis Date Noted   Hyperkalemia 06/10/2021   Chronic bronchitis ([place]) 05/30/2021   Lacunar stroke ([place]) 03/08/2021   Prediabetes 01/02/2021   History of CVA (cerebrovascular accident) without residual deficits 01/01/2021   Balance problem 11/29/2020   Acute cystitis with hematuria 01/31/2020   Recurrent UTI 01/31/2020   Low back pain 10/13/2019   CAD (coronary artery disease) 03/04/2018   Lightheadedness 11/05/2017   Canker sore 01/01/2017   Kidney cyst, acquired 07/22/2016   Rash and nonspecific skin eruption 09/12/2015   Obesity 01/03/2015   Upper airway cough syndrome 12/08/2014   Right-sided thoracic back pain 05/12/2014   Palpitations 05/12/2014   Snoring 12/22/2013   Diarrhea 07/19/2013   Dizziness 07/19/2013   Fatigue 10/15/2011   Impaired glucose tolerance 11/22/2010   Well adult exam 11/22/2010   Abdominal pain, lower 11/22/2010   SYNCOPE  09/27/2009   Edema 09/27/2009   Recurrent pneumonia 08/15/2009   ANEMIA, NORMOCYTIC 08/10/2009   SKIN RASH 08/10/2009   FOOT PAIN 07/25/2009   Dyspnea 07/01/2008   Acute bronchitis 06/12/2007   Essential hypertension 03/08/2007   DIVERTICULOSIS, COLON 03/08/2007   ESOPHAGEAL STRICTURE 03/06/2007   IBS 03/06/2007   SHINGLES, HX OF 03/06/2007   Dyslipidemia 02/02/2007   GERD 02/02/2007   Osteoporosis 02/02/2007   DVT, HX OF 02/02/2007    PCP: [name], MD  REFERRING PROVIDER: [name], MD  REFERRING DIAG:  M54.50,G89.29 (ICD-10-CM) - Chronic right-sided low back pain without sciatica  R26.9 (ICD-10-CM) - Gait disorder    Rationale for Evaluation and Treatment: Rehabilitation  THERAPY DIAG:  Muscle weakness (generalized)  History of falling  Unsteadiness on feet  Difficulty in walking, not elsewhere classified  ONSET DATE: 01/14/2022  SUBJECTIVE:                                                                                                                                                                                           SUBJECTIVE STATEMENT:  I'm feeling better, but I have a stiff neck and I wonder if its from my last fall.   PERTINENT HISTORY:    PAIN:  Are you having pain? Yes: NPRS scale: 2/10 Location: neck  Type: pulling feeling  Aggravating factors: turning head  Relieving factors: heat and tylenol, not moving head    PRECAUTIONS: Fall, very HOH   WEIGHT BEARING RESTRICTIONS: No  FALLS:  Has patient fallen in last 6 months? Yes. Number of falls 1 on the 17th; (+) FOF  LIVING ENVIRONMENT: Lives with: lives alone but has  a daughter that lives 3 minutes away  Lives in: House/apartment Stairs: 5STE with L ascending rail, no steps inside the home but does have threshold step to get into home  Has following equipment at home: Single point cane  OCCUPATION: retired   PLOF: Independent  PATIENT GOALS: want to walk  straight and not keel over   NEXT MD VISIT: referring in 5-6 months   OBJECTIVE:   DIAGNOSTIC FINDINGS:  Per pt had xrays done on the 16th, low back and hips looked good but have some arthritis in the back   PATIENT SURVEYS:  FOTO will do 2nd session  SCREENING FOR RED FLAGS: Bowel or bladder incontinence: Yes: bladder incontinence for 5 years, not associated with back pain  Spinal tumors: No Cauda equina syndrome: No Compression fracture: No Abdominal aneurysm: No  COGNITION: Overall cognitive status: Within functional limits for tasks assessed     SENSATION: WFL Not tested  MUSCLE LENGTH:   POSTURE: rounded shoulders, forward head, decreased lumbar lordosis, and increased thoracic kyphosis  PALPATION:   LUMBAR ROM:   AROM eval  Flexion   Extension   Right lateral flexion   Left lateral flexion   Right rotation   Left rotation    (Blank rows = not tested)  LOWER EXTREMITY ROM:     Active  Right eval Left eval  Hip flexion    Hip extension    Hip abduction    Hip adduction    Hip internal rotation    Hip external rotation    Knee flexion    Knee extension    Ankle dorsiflexion    Ankle plantarflexion    Ankle inversion    Ankle eversion     (Blank rows = not tested)  LOWER EXTREMITY MMT:    MMT Right eval Left eval R 12/29 L 12/29 R 1/16 L 1/16  Hip flexion '3 3 4 '$ 4- 4+ 4+  Hip extension        Hip abduction 3+ 3 3+ 4 4+ 4+  Hip adduction        Hip internal rotation        Hip external rotation        Knee flexion 5 4 4+ 4+    Knee extension '5 4 5 '$ 4+ 5 5  Ankle dorsiflexion 5 5      Ankle plantarflexion        Ankle inversion        Ankle eversion         (Blank rows = not tested)  LUMBAR SPECIAL TESTS:    FUNCTIONAL TESTS:  5 times sit to stand: 14 seconds no UEs  3 minute walk test: 478f no rest RPE 4/10 Dynamic Gait Index: 14/24  1/16 5x sit to stand 12 sec no UE use  3 minute walk test 550 no rest RPE 3/10  DGI:  16/24   GAIT: Distance walked: in clinic distances  Assistive device utilized: None Level of assistance: Complete Independence Comments: flexed at hips, short step lengths, stance times, L/R drift increasing with fatigue, + trendelenburg. Gait speed 2.7 ft/sec   TODAY'S TREATMENT:  DATE:    04/26/22  TherEx  SKTC 5x5 second holds B Lumbar rotation stretch 5x5 second holds B Upper trap stretch 2x30 seconds B Chin tuck x5 Chin tuck + rotation x10 B    NMR  Tandem stance on blue foam pad 3x30 seconds B Forward and lateral step overs 4 inch hurdles SLS with one foot on ball 3x15 seconds  B  Tandem walks at bar x4 rounds    1/16  Performed balance and endurance testing with the patient. Reviewed goals and POC   Balance:  Airex: Narrow base of support 2 x 20-second hold contact-guard assistance Narrow base of support eyes closed 2 x 20-second hold min assist   Step on the Airex forward and lateral x 10 each bilateral  All other balance were performed during this balance testing  NuStep 5 minutes level 3   04/19/22    TherEx  Nustep L3 x6 minutes BLEs only SKTC x5 B with 5 second holds Lumbar rotation stretch 5x5 second holds B  Seated LAQs green TB x10 B Seated hip ABD x10 B green TB  Seated marches x10 B green TB  HS stretches 1x30 seconds B Piriformis stretches 1x30 seconds B 3D hip excursions x10 B all directions  Tandem stance 3x30 seconds B blue foam pad          PATIENT EDUCATION:  Education details: exercise form/purpose  Person educated: Patient Education method: Consulting civil engineer, Media planner, and Handouts Education comprehension: verbalized understanding, returned demonstration, and needs further education  HOME EXERCISE PROGRAM:  Access Code: [email or web address] URL: [email or web address] Date:  04/26/2022 Prepared by: Deniece Ree  Exercises - Hip Abduction with Resistance Loop  - 2 x daily - 7 x weekly - 1 sets - 10 reps - 1 hold - Sit to Stand with Resistance Around Legs  - 2 x daily - 7 x weekly - 1 sets - 10 reps - Standing Hip Hiking  - 1 x daily - 7 x weekly - 3 sets - 10 reps - Mini Squat with Counter Support  - 1 x daily - 7 x weekly - 3 sets - 10 reps - Single Leg Stance  - 1 x daily - 7 x weekly - 3 sets - 10 reps - Tandem Stance  - 2 x daily - 7 x weekly - 1 sets - 3 reps - 15-30 hold - Narrow Stance with Counter Support  - 1 x daily - 7 x weekly - 3 sets - 10 reps - Supine Single Knee to Chest Stretch  - 1 x daily - 7 x weekly - 3 sets - 10 reps - Supine Lower Trunk Rotation  - 1 x daily - 7 x weekly - 3 sets - 10 reps - Seated Upper Trapezius Stretch  - 1 x daily - 7 x weekly - 3 sets - 10 reps - Seated Cervical Retraction  - 1 x daily - 7 x weekly - 3 sets - 10 reps - Seated Cervical Retraction and Rotation  - 1 x daily - 7 x weekly - 3 sets - 10 reps  ASSESSMENT:  CLINICAL IMPRESSION:  Ms. Shawley arrives feeling better, doing well overall. We repeated stretches for her lumbar spine as she tells me that really helped her last time also worked on some exercises for her neck. Added both to HEP. Otherwise spent session focusing on functional balance. Will continue efforts.  OBJECTIVE IMPAIRMENTS: Abnormal gait, decreased activity tolerance, decreased balance, decreased coordination, decreased mobility, difficulty walking, decreased strength, and  postural dysfunction.   ACTIVITY LIMITATIONS: standing, stairs, transfers, and locomotion level  PARTICIPATION LIMITATIONS: shopping, community activity, yard work, and church  PERSONAL FACTORS: Fitness, Social background, and Time since onset of injury/illness/exacerbation are also affecting patient's functional outcome.   REHAB POTENTIAL: Good  CLINICAL DECISION MAKING: Stable/uncomplicated  EVALUATION COMPLEXITY:  Low   GOALS: Goals reviewed with patient? Yes  SHORT TERM GOALS: Target date: 04/02/2022    Will be compliant with appropriate progressive HEP  Baseline: Goal status: MET 12/29  2.  Will be able to name 3 ways to prevent falls at home and in the community  Baseline:  Goal status: IN PROGRESS 12/29-  needs ongoing education     LONG TERM GOALS: Target date: 04/23/2022    MMT to improve by 1 grade in all weak groups  Baseline:  Goal status: IN PROGRESS 12/29  2.  Will be able to ambulate at least 641f in 3MWT to show improved mobility and community access  Baseline:  Goal status: IN PROGRESS 12/29  3.  Will be able to ambulate community distances with no L/R drift and no feelings of unsteadiness Baseline:  Goal status: MET 12/29  4.  Will score at least 20 on DGI to show improved functional balance skills  Baseline:  Goal status: INITIAL  5.  Will be able to navigate at least 5 steps with no rail and step over step pattern with minimal unsteadiness to show improved balance and community access  Baseline:  Goal status: INITIAL  6.  Will be compliant with appropriate gym based exercise program to maintain functional gains and prevent recurrence of condition  Baseline:  Goal status: INITIAL  PLAN:  PT FREQUENCY: 2x/week  PT DURATION: 6 weeks  PLANNED INTERVENTIONS: Therapeutic exercises, Therapeutic activity, Neuromuscular re-education, Balance training, Gait training, Patient/Family education, Self Care, Joint mobilization, Stair training, DME instructions, Aquatic Therapy, Spinal mobilization, Cryotherapy, Moist heat, Taping, Ultrasound, Ionotophoresis '4mg'$ /ml Dexamethasone, Manual therapy, and Re-evaluation.  PLAN FOR NEXT SESSION: balance and strength focus, HEP updates PRN. Need to work on things like hurdles that force her to clear obstacles   KDeniece ReePT DPT PN2

## 2022-04-30 ENCOUNTER — Ambulatory Visit (HOSPITAL_BASED_OUTPATIENT_CLINIC_OR_DEPARTMENT_OTHER): Payer: PPO | Admitting: Physical Therapy

## 2022-04-30 ENCOUNTER — Encounter (HOSPITAL_BASED_OUTPATIENT_CLINIC_OR_DEPARTMENT_OTHER): Payer: Self-pay | Admitting: Physical Therapy

## 2022-04-30 DIAGNOSIS — M6281 Muscle weakness (generalized): Secondary | ICD-10-CM

## 2022-04-30 DIAGNOSIS — R262 Difficulty in walking, not elsewhere classified: Secondary | ICD-10-CM

## 2022-04-30 DIAGNOSIS — R2681 Unsteadiness on feet: Secondary | ICD-10-CM

## 2022-04-30 DIAGNOSIS — Z9181 History of falling: Secondary | ICD-10-CM

## 2022-04-30 NOTE — Therapy (Signed)
OUTPATIENT PHYSICAL THERAPY THORACOLUMBAR TREATMENT   Patient Name: Diana Stout MRN: [id number] DOB:08/03/1930, 89 y.o., female Today's Date: 04/30/2022  END OF SESSION:  PT End of Session - 04/30/22 1354     Visit Number 10    Number of Visits 20    Date for PT Re-Evaluation 06/04/22    Authorization Type HTA    Authorization Time Period 03/12/22 to 05/07/22    Progress Note Due on Visit 18    PT Start Time 6237    PT Stop Time 1427    PT Time Calculation (min) 39 min    Activity Tolerance Patient tolerated treatment well    Behavior During Therapy [facility] for tasks assessed/performed                       Past Medical History:  Diagnosis Date   Abdominal pain, lower 11/22/2010   2015 resolved off Zocor 5/17?urticaria w/GI upset: Dtr is allergic to seafood - the pt is on MegaRed krill oil - d/c MegaRed    Abnormal chest CT 05/22/2011   Followed in Pulmonary clinic/ [facility]/ [place]  05/14/11 CT CHEST WITHOUT CONTRAST 1. Multiple small pulmonary nodules scattered throughout the lungs  bilaterally ranging in size from 3-6 mm.    2. There is a small hiatal hernia.  3. Atherosclerosis.  4. Status post cholecystectomy.  5. Low attenuation hepatic lesions unchanged compared to recent  abdominal CT scan, likely to represent cysts   ANEMIA, NORMOCYTIC 08/10/2009   mild     DIVERTICULOSIS, COLON 03/08/2007   Qualifier: Diagnosis of  By: [name] MD, [name]    Dizziness 07/19/2013   Likely due to diarrhea/meds 4/15    DVT (deep venous thrombosis) ([place]) 1998   hx of right   Dyslipidemia 02/02/2007   Chronic 1/13 worse     Dyspnea 07/01/2008   2/13 improved w/reg exercise 1/17 deconditioning - CL stress test was nl     Edema 09/27/2009   Qualifier: Diagnosis of  By: [name], MD, [name]    ESOPHAGEAL STRICTURE 03/06/2007   Qualifier: Diagnosis of  By: [name] MD, [name]    Esophagitis 2006   Essential hypertension 03/08/2007   Chronic  On Benicar    FOOT PAIN  07/25/2009   R foot OA     GERD 02/02/2007   Wedge pillow Protonix prn, Pepcid - d/c   History of pneumonia 12/2016   History of shingles    Hyperlipidemia    IBS 03/06/2007   Qualifier: Diagnosis of  By: [name] MD, [name]    Impaired glucose tolerance 11/22/2010   Kidney cysts    left- Dr [name]   Lung nodule 05/08/2011   04/15/11 RLL 4 mm nodule on abd CT - [facility] office    Obesity 01/03/2015   Osteoporosis 02/02/2007   Chronic  Vit D, yoga   Palpitations 05/12/2014   Dr [name] 2/16 poss due to steroid pack    Rash and nonspecific skin eruption 09/12/2015   5/17?urticaria Pt is allergic to seafood - the pt is on MegaRed krill oil - d/c MegaRed    Upper airway cough syndrome 12/08/2014   Dr [name] Flutter valve added 01/02/2015 > resolved 02/03/2015  - rec try off gerd rx p 03/09/15    Past Surgical History:  Procedure Laterality Date   APPENDECTOMY     CHOLECYSTECTOMY     HYSTEROSCOPY WITH RESECTOSCOPE  7/99   resect polyp   MOHS  SURGERY  05/2018   face   TUBAL LIGATION Bilateral    Patient Active Problem List   Diagnosis Date Noted   Hyperkalemia 06/10/2021   Chronic bronchitis ([place]) 05/30/2021   Lacunar stroke ([place]) 03/08/2021   Prediabetes 01/02/2021   History of CVA (cerebrovascular accident) without residual deficits 01/01/2021   Balance problem 11/29/2020   Acute cystitis with hematuria 01/31/2020   Recurrent UTI 01/31/2020   Low back pain 10/13/2019   CAD (coronary artery disease) 03/04/2018   Lightheadedness 11/05/2017   Canker sore 01/01/2017   Kidney cyst, acquired 07/22/2016   Rash and nonspecific skin eruption 09/12/2015   Obesity 01/03/2015   Upper airway cough syndrome 12/08/2014   Right-sided thoracic back pain 05/12/2014   Palpitations 05/12/2014   Snoring 12/22/2013   Diarrhea 07/19/2013   Dizziness 07/19/2013   Fatigue 10/15/2011   Impaired glucose tolerance 11/22/2010   Well adult exam 11/22/2010   Abdominal pain, lower 11/22/2010   SYNCOPE  09/27/2009   Edema 09/27/2009   Recurrent pneumonia 08/15/2009   ANEMIA, NORMOCYTIC 08/10/2009   SKIN RASH 08/10/2009   FOOT PAIN 07/25/2009   Dyspnea 07/01/2008   Acute bronchitis 06/12/2007   Essential hypertension 03/08/2007   DIVERTICULOSIS, COLON 03/08/2007   ESOPHAGEAL STRICTURE 03/06/2007   IBS 03/06/2007   SHINGLES, HX OF 03/06/2007   Dyslipidemia 02/02/2007   GERD 02/02/2007   Osteoporosis 02/02/2007   DVT, HX OF 02/02/2007    PCP: Plotnikov, [name], MD  REFERRING PROVIDER: [name], MD  REFERRING DIAG:  M54.50,G89.29 (ICD-10-CM) - Chronic right-sided low back pain without sciatica  R26.9 (ICD-10-CM) - Gait disorder    Rationale for Evaluation and Treatment: Rehabilitation  THERAPY DIAG:  Muscle weakness (generalized)  History of falling  Unsteadiness on feet  Difficulty in walking, not elsewhere classified  ONSET DATE: 01/14/2022  SUBJECTIVE:                                                                                                                                                                                           SUBJECTIVE STATEMENT:  Feeling better except for my neck, its better but still bothering me. Walked [employer] for about an hour yesterday. I think my balance is better today, it was off last week.   PERTINENT HISTORY:    PAIN:  Are you having pain? Yes: NPRS scale: 2/10 Location: neck  Type: pulling feeling  Aggravating factors: turning head  Relieving factors: heat and tylenol, not moving head    PRECAUTIONS: Fall, very HOH   WEIGHT BEARING RESTRICTIONS: No  FALLS:  Has patient fallen in last 6 months? Yes. Number of falls  1 on the 17th; (+) FOF  LIVING ENVIRONMENT: Lives with: lives alone but has a daughter that lives 3 minutes away  Lives in: House/apartment Stairs: 5STE with L ascending rail, no steps inside the home but does have threshold step to get into home  Has following equipment at home: Single  point cane  OCCUPATION: retired   PLOF: Independent  PATIENT GOALS: want to walk straight and not keel over   NEXT MD VISIT: referring in 5-6 months   OBJECTIVE:   DIAGNOSTIC FINDINGS:  Per pt had xrays done on the 16th, low back and hips looked good but have some arthritis in the back   PATIENT SURVEYS:  FOTO will do 2nd session  SCREENING FOR RED FLAGS: Bowel or bladder incontinence: Yes: bladder incontinence for 5 years, not associated with back pain  Spinal tumors: No Cauda equina syndrome: No Compression fracture: No Abdominal aneurysm: No  COGNITION: Overall cognitive status: Within functional limits for tasks assessed     SENSATION: WFL Not tested  MUSCLE LENGTH:   POSTURE: rounded shoulders, forward head, decreased lumbar lordosis, and increased thoracic kyphosis  PALPATION:   LUMBAR ROM:   AROM eval  Flexion   Extension   Right lateral flexion   Left lateral flexion   Right rotation   Left rotation    (Blank rows = not tested)  LOWER EXTREMITY ROM:     Active  Right eval Left eval  Hip flexion    Hip extension    Hip abduction    Hip adduction    Hip internal rotation    Hip external rotation    Knee flexion    Knee extension    Ankle dorsiflexion    Ankle plantarflexion    Ankle inversion    Ankle eversion     (Blank rows = not tested)  LOWER EXTREMITY MMT:    MMT Right eval Left eval R 12/29 L 12/29 R 1/16 L 1/16  Hip flexion '3 3 4 '$ 4- 4+ 4+  Hip extension        Hip abduction 3+ 3 3+ 4 4+ 4+  Hip adduction        Hip internal rotation        Hip external rotation        Knee flexion 5 4 4+ 4+    Knee extension '5 4 5 '$ 4+ 5 5  Ankle dorsiflexion 5 5      Ankle plantarflexion        Ankle inversion        Ankle eversion         (Blank rows = not tested)  LUMBAR SPECIAL TESTS:    FUNCTIONAL TESTS:  5 times sit to stand: 14 seconds no UEs  3 minute walk test: 456f no rest RPE 4/10 Dynamic Gait Index:  14/24  1/16 5x sit to stand 12 sec no UE use  3 minute walk test 550 no rest RPE 3/10  DGI: 16/24   GAIT: Distance walked: in clinic distances  Assistive device utilized: None Level of assistance: Complete Independence Comments: flexed at hips, short step lengths, stance times, L/R drift increasing with fatigue, + trendelenburg. Gait speed 2.7 ft/sec   TODAY'S TREATMENT:  DATE:   04/30/22  TherEX  Nustep L4 x6 minutes BLEs only Hip hikes x15 B Hip hikes + ABD x15 B   NMR  SLS intermittent UE support 3x20 seconds solid surface Narrow BOS on blue foam pad 3x30 seconds  Lateral toe taps off blue foam pad x10 B Forward toe taps off blue foam pad x10 B Backwards toe taps off blue foam pad x10 B Tandem stance horizontal head turns solid surface Tandem stance head nods solid surface Tandem stance EC solid surface  Tandem gait forward/backward x3 laps at rail    04/26/22  TherEx  SKTC 5x5 second holds B Lumbar rotation stretch 5x5 second holds B Upper trap stretch 2x30 seconds B Chin tuck x5 Chin tuck + rotation x10 B    NMR  Tandem stance on blue foam pad 3x30 seconds B Forward and lateral step overs 4 inch hurdles SLS with one foot on ball 3x15 seconds  B  Tandem walks at bar x4 rounds    1/16  Performed balance and endurance testing with the patient. Reviewed goals and POC   Balance:  Airex: Narrow base of support 2 x 20-second hold contact-guard assistance Narrow base of support eyes closed 2 x 20-second hold min assist   Step on the Airex forward and lateral x 10 each bilateral  All other balance were performed during this balance testing  NuStep 5 minutes level 3   04/19/22    TherEx  Nustep L3 x6 minutes BLEs only SKTC x5 B with 5 second holds Lumbar rotation stretch 5x5 second holds B  Seated LAQs green TB x10  B Seated hip ABD x10 B green TB  Seated marches x10 B green TB  HS stretches 1x30 seconds B Piriformis stretches 1x30 seconds B 3D hip excursions x10 B all directions  Tandem stance 3x30 seconds B blue foam pad          PATIENT EDUCATION:  Education details: exercise form/purpose  Person educated: Patient Education method: Consulting civil engineer, Media planner, and Handouts Education comprehension: verbalized understanding, returned demonstration, and needs further education  HOME EXERCISE PROGRAM:  Access Code: [email or web address] URL: [email or web address] Date: 04/26/2022 Prepared by: Deniece Ree  Exercises - Hip Abduction with Resistance Loop  - 2 x daily - 7 x weekly - 1 sets - 10 reps - 1 hold - Sit to Stand with Resistance Around Legs  - 2 x daily - 7 x weekly - 1 sets - 10 reps - Standing Hip Hiking  - 1 x daily - 7 x weekly - 3 sets - 10 reps - Mini Squat with Counter Support  - 1 x daily - 7 x weekly - 3 sets - 10 reps - Single Leg Stance  - 1 x daily - 7 x weekly - 3 sets - 10 reps - Tandem Stance  - 2 x daily - 7 x weekly - 1 sets - 3 reps - 15-30 hold - Narrow Stance with Counter Support  - 1 x daily - 7 x weekly - 3 sets - 10 reps - Supine Single Knee to Chest Stretch  - 1 x daily - 7 x weekly - 3 sets - 10 reps - Supine Lower Trunk Rotation  - 1 x daily - 7 x weekly - 3 sets - 10 reps - Seated Upper Trapezius Stretch  - 1 x daily - 7 x weekly - 3 sets - 10 reps - Seated Cervical Retraction  - 1 x daily - 7 x weekly - 3 sets -  10 reps - Seated Cervical Retraction and Rotation  - 1 x daily - 7 x weekly - 3 sets - 10 reps  ASSESSMENT:  CLINICAL IMPRESSION:  Ms. Ingle arrives feeling better, still having issues with her neck- encouraged her to keep up with cervical exercises we practiced and discussed last week. Continued working on functional strength and balance, even with her recent fall seems to be making steady progress. Will continue to progress as able and  tolerated.   OBJECTIVE IMPAIRMENTS: Abnormal gait, decreased activity tolerance, decreased balance, decreased coordination, decreased mobility, difficulty walking, decreased strength, and postural dysfunction.   ACTIVITY LIMITATIONS: standing, stairs, transfers, and locomotion level  PARTICIPATION LIMITATIONS: shopping, community activity, yard work, and church  PERSONAL FACTORS: Fitness, Social background, and Time since onset of injury/illness/exacerbation are also affecting patient's functional outcome.   REHAB POTENTIAL: Good  CLINICAL DECISION MAKING: Stable/uncomplicated  EVALUATION COMPLEXITY: Low   GOALS: Goals reviewed with patient? Yes  SHORT TERM GOALS: Target date: 04/02/2022    Will be compliant with appropriate progressive HEP  Baseline: Goal status: MET 12/29  2.  Will be able to name 3 ways to prevent falls at home and in the community  Baseline:  Goal status: IN PROGRESS 12/29-  needs ongoing education     LONG TERM GOALS: Target date: 04/23/2022    MMT to improve by 1 grade in all weak groups  Baseline:  Goal status: IN PROGRESS 12/29  2.  Will be able to ambulate at least 668f in 3MWT to show improved mobility and community access  Baseline:  Goal status: IN PROGRESS 12/29  3.  Will be able to ambulate community distances with no L/R drift and no feelings of unsteadiness Baseline:  Goal status: MET 12/29  4.  Will score at least 20 on DGI to show improved functional balance skills  Baseline:  Goal status: INITIAL  5.  Will be able to navigate at least 5 steps with no rail and step over step pattern with minimal unsteadiness to show improved balance and community access  Baseline:  Goal status: INITIAL  6.  Will be compliant with appropriate gym based exercise program to maintain functional gains and prevent recurrence of condition  Baseline:  Goal status: INITIAL  PLAN:  PT FREQUENCY: 2x/week  PT DURATION: 6 weeks  PLANNED  INTERVENTIONS: Therapeutic exercises, Therapeutic activity, Neuromuscular re-education, Balance training, Gait training, Patient/Family education, Self Care, Joint mobilization, Stair training, DME instructions, Aquatic Therapy, Spinal mobilization, Cryotherapy, Moist heat, Taping, Ultrasound, Ionotophoresis '4mg'$ /ml Dexamethasone, Manual therapy, and Re-evaluation.  PLAN FOR NEXT SESSION: balance and strength focus, HEP updates PRN. Need to work on things like hurdles that force her to clear obstacles   KDeniece ReePT DPT PN2

## 2022-05-03 ENCOUNTER — Ambulatory Visit (HOSPITAL_BASED_OUTPATIENT_CLINIC_OR_DEPARTMENT_OTHER): Payer: PPO | Admitting: Physical Therapy

## 2022-05-04 DIAGNOSIS — R0981 Nasal congestion: Secondary | ICD-10-CM | POA: Diagnosis not present

## 2022-05-04 DIAGNOSIS — U071 COVID-19: Secondary | ICD-10-CM | POA: Diagnosis not present

## 2022-05-04 DIAGNOSIS — R051 Acute cough: Secondary | ICD-10-CM | POA: Diagnosis not present

## 2022-05-07 ENCOUNTER — Ambulatory Visit (HOSPITAL_BASED_OUTPATIENT_CLINIC_OR_DEPARTMENT_OTHER): Payer: PPO | Admitting: Physical Therapy

## 2022-05-08 ENCOUNTER — Ambulatory Visit: Payer: PPO | Admitting: Internal Medicine

## 2022-05-10 ENCOUNTER — Ambulatory Visit (HOSPITAL_BASED_OUTPATIENT_CLINIC_OR_DEPARTMENT_OTHER): Payer: PPO | Admitting: Physical Therapy

## 2022-05-16 ENCOUNTER — Encounter: Payer: Self-pay | Admitting: Internal Medicine

## 2022-05-16 ENCOUNTER — Ambulatory Visit (INDEPENDENT_AMBULATORY_CARE_PROVIDER_SITE_OTHER): Payer: PPO | Admitting: Internal Medicine

## 2022-05-16 VITALS — BP 120/70 | HR 95 | Temp 98.4°F | Ht 59.0 in | Wt 163.0 lb

## 2022-05-16 DIAGNOSIS — N390 Urinary tract infection, site not specified: Secondary | ICD-10-CM | POA: Diagnosis not present

## 2022-05-16 DIAGNOSIS — R202 Paresthesia of skin: Secondary | ICD-10-CM

## 2022-05-16 DIAGNOSIS — I6381 Other cerebral infarction due to occlusion or stenosis of small artery: Secondary | ICD-10-CM

## 2022-05-16 DIAGNOSIS — Z8616 Personal history of COVID-19: Secondary | ICD-10-CM | POA: Diagnosis not present

## 2022-05-16 DIAGNOSIS — M545 Low back pain, unspecified: Secondary | ICD-10-CM | POA: Diagnosis not present

## 2022-05-16 DIAGNOSIS — W19XXXS Unspecified fall, sequela: Secondary | ICD-10-CM | POA: Diagnosis not present

## 2022-05-16 DIAGNOSIS — G8929 Other chronic pain: Secondary | ICD-10-CM

## 2022-05-16 DIAGNOSIS — R7302 Impaired glucose tolerance (oral): Secondary | ICD-10-CM

## 2022-05-16 DIAGNOSIS — R270 Ataxia, unspecified: Secondary | ICD-10-CM | POA: Diagnosis not present

## 2022-05-16 DIAGNOSIS — R55 Syncope and collapse: Secondary | ICD-10-CM

## 2022-05-16 NOTE — Progress Notes (Signed)
Subjective:  Patient ID: Diana Stout, female    DOB: 15-Jan-1931  Age: 89 y.o. MRN: [id number]  CC: No chief complaint on file.   HPI Diana Stout presents for two falls - Feb 22, 2022 - hard fall on the R shoulder Apr 12, 2022   S/p recent COVID   Outpatient Medications Prior to Visit  Medication Sig Dispense Refill   albuterol (VENTOLIN HFA) 108 (90 Base) MCG/ACT inhaler Inhale 1 puff into the lungs every 6 (six) hours as needed. 8 g 3   Apple Cider Vinegar (APPLE CIDER VINEGAR ULTRA) 600 MG CAPS Take by mouth.     aspirin 81 MG tablet Take 81 mg by mouth daily.     cholecalciferol (VITAMIN D) 1000 UNITS tablet Take 1,000 Units by mouth daily.     cyanocobalamin 1000 MCG tablet Take 1,000 mcg by mouth daily.     fluticasone (FLONASE) 50 MCG/ACT nasal spray USE 2 SPRAYS IN EACH NOSTRIL AS NEEDED FOR RHINITIS 48 g 3   furosemide (LASIX) 20 MG tablet TAKE 1 TABLET(20 MG) BY MOUTH DAILY AS NEEDED 90 tablet 1   lidocaine (XYLOCAINE) 2 % solution Take by mouth.     Magnesium 400 MG CAPS Take by mouth daily.     nitroGLYCERIN (NITROSTAT) 0.4 MG SL tablet Place 1 tablet (0.4 mg total) under the tongue every 5 (five) minutes as needed for chest pain. 25 tablet 3   potassium chloride (KLOR-CON M) 10 MEQ tablet TAKE ONCE A DAY WITH FUROSEMIDE AS NEEDED     Probiotic Product (ALIGN) 4 MG CAPS Take 1 capsule (4 mg total) by mouth daily. 30 capsule 0   Respiratory Therapy Supplies (FLUTTER) DEVI Use as directed 1 each 0   guaiFENesin (MUCINEX) 600 MG 12 hr tablet Take by mouth.     meloxicam (MOBIC) 15 MG tablet Take by mouth.     No facility-administered medications prior to visit.    ROS: Review of Systems  Constitutional:  Negative for activity change, appetite change, chills, fatigue and unexpected weight change.  HENT:  Negative for congestion, mouth sores and sinus pressure.   Eyes:  Negative for visual disturbance.  Respiratory:  Negative for cough and chest tightness.    Gastrointestinal:  Negative for abdominal pain and nausea.  Genitourinary:  Negative for difficulty urinating, frequency and vaginal pain.  Musculoskeletal:  Positive for gait problem. Negative for back pain.  Skin:  Negative for pallor and rash.  Neurological:  Negative for dizziness, tremors, weakness, light-headedness, numbness and headaches.  Hematological:  Does not bruise/bleed easily.  Psychiatric/Behavioral:  Negative for confusion, sleep disturbance and suicidal ideas. The patient is not nervous/anxious.     Objective:  BP 120/70 (BP Location: Right Arm, Patient Position: Sitting, Cuff Size: Normal)   Pulse 95   Temp 98.4 F (36.9 C) (Oral)   Ht 4' 11"$  (1.499 m)   Wt 163 lb (73.9 kg)   LMP 04/08/1996   SpO2 96%   BMI 32.92 kg/m   BP Readings from Last 3 Encounters:  05/16/22 120/70  01/07/22 134/78  12/19/21 122/70    Wt Readings from Last 3 Encounters:  05/16/22 163 lb (73.9 kg)  01/07/22 169 lb 6.4 oz (76.8 kg)  12/19/21 164 lb 9.6 oz (74.7 kg)    Physical Exam Constitutional:      General: She is not in acute distress.    Appearance: She is well-developed.  HENT:     Head: Normocephalic.  Right Ear: External ear normal.     Left Ear: External ear normal.     Nose: Nose normal.  Eyes:     General:        Right eye: No discharge.        Left eye: No discharge.     Conjunctiva/sclera: Conjunctivae normal.     Pupils: Pupils are equal, round, and reactive to light.  Neck:     Thyroid: No thyromegaly.     Vascular: No JVD.     Trachea: No tracheal deviation.  Cardiovascular:     Rate and Rhythm: Normal rate and regular rhythm.     Heart sounds: Normal heart sounds.  Pulmonary:     Effort: No respiratory distress.     Breath sounds: No stridor. No wheezing.  Abdominal:     General: Bowel sounds are normal. There is no distension.     Palpations: Abdomen is soft. There is no mass.     Tenderness: There is no abdominal tenderness. There is no  guarding or rebound.  Musculoskeletal:        General: No tenderness.     Cervical back: Normal range of motion and neck supple. No rigidity.  Lymphadenopathy:     Cervical: No cervical adenopathy.  Skin:    Findings: No erythema or rash.  Neurological:     Cranial Nerves: No cranial nerve deficit.     Motor: No abnormal muscle tone.     Coordination: Coordination normal.     Deep Tendon Reflexes: Reflexes normal.  Psychiatric:        Behavior: Behavior normal.        Thought Content: Thought content normal.        Judgment: Judgment normal.   Slightly ataxic  Lab Results  Component Value Date   WBC 5.3 05/17/2022   HGB 12.1 05/17/2022   HCT 37.2 05/17/2022   PLT 270.0 05/17/2022   GLUCOSE 101 (H) 05/17/2022   CHOL 205 (H) 12/19/2021   TRIG 122.0 12/19/2021   HDL 43.00 12/19/2021   LDLDIRECT 154.9 05/01/2011   LDLCALC 137 (H) 12/19/2021   ALT 13 05/17/2022   AST 16 05/17/2022   NA 141 05/17/2022   K 4.0 05/17/2022   CL 104 05/17/2022   CREATININE 0.68 05/17/2022   BUN 18 05/17/2022   CO2 28 05/17/2022   TSH 0.92 05/17/2022   HGBA1C 6.2 12/19/2021    [place] AXILLA LEFT  Result Date: 04/15/2022 CLINICAL DATA:  89 year old female presenting for 2 month follow-up of likely reactive left axillary lymph nodes. EXAM: ULTRASOUND OF THE LEFT AXILLA COMPARISON:  Previous exam(s). FINDINGS: Ultrasound is performed, showing morphologically normal lymph nodes within the left axilla. The appearance is improved when compared to prior study. IMPRESSION: No suspicious left axillary lymphadenopathy. RECOMMENDATION: Routine annual screening due in October 2024. I have discussed the findings and recommendations with the patient. If applicable, a reminder letter will be sent to the patient regarding the next appointment. BI-RADS CATEGORY  1: Negative. Electronically Signed   By: Kristopher Oppenheim M.D.   On: 04/15/2022 10:35   Assessment & Plan:   Problem List Items Addressed This Visit        Endocrine   Impaired glucose tolerance    Check A1c        Nervous and Auditory   Lacunar stroke ([place])    Will get head CT LS spine X ray      Relevant Orders   CT HEAD WO CONTRAST (  5MM)   TSH (Completed)   Urinalysis (Completed)   CBC with Differential/Platelet (Completed)   Comprehensive metabolic panel (Completed)   Ammonia (Completed)   CULTURE, URINE COMPREHENSIVE (Completed)   Cortisol (Completed)   T4, free (Completed)   Vitamin B12 (Completed)   Magnesium (Completed)     Genitourinary   Recurrent UTI    Check UA/Cx      Relevant Orders   Urinalysis (Completed)   CULTURE, URINE COMPREHENSIVE (Completed)     Other   SYNCOPE    No relapse      Low back pain    X ray LS spine      Relevant Orders   DG Lumbar Spine 2-3 Views   History of COVID-19    Recovered      Falls, sequela - Primary     S/p two falls - Feb 22, 2022 - hard fall on the R shoulder Apr 12, 2022 Likely multifactorial.  Obtain Head CT Obtain labs Labs Cont w/PT      Relevant Orders   CT HEAD WO CONTRAST (5MM)   DG Lumbar Spine 2-3 Views   Cortisol (Completed)   Ataxia    Will get head CT LS spine X ray was ok at Dr French Ana      Relevant Orders   CT HEAD WO CONTRAST (5MM)   TSH (Completed)   Urinalysis (Completed)   CBC with Differential/Platelet (Completed)   Comprehensive metabolic panel (Completed)   Ammonia (Completed)   CULTURE, URINE COMPREHENSIVE (Completed)   Cortisol (Completed)   T4, free (Completed)   Vitamin B12 (Completed)   Magnesium (Completed)   Other Visit Diagnoses     Paresthesia       Relevant Orders   Cortisol (Completed)   Vitamin B12 (Completed)   Magnesium (Completed)         No orders of the defined types were placed in this encounter.     Follow-up: Return in about 4 weeks (around 06/13/2022) for a follow-up visit.  Walker Kehr, MD

## 2022-05-16 NOTE — Assessment & Plan Note (Signed)
No relapse 

## 2022-05-16 NOTE — Assessment & Plan Note (Signed)
Recovered

## 2022-05-16 NOTE — Assessment & Plan Note (Addendum)
Will get head CT LS spine X ray was ok at Dr French Ana

## 2022-05-16 NOTE — Assessment & Plan Note (Signed)
X ray LS spine

## 2022-05-16 NOTE — Assessment & Plan Note (Addendum)
S/p two falls - Feb 22, 2022 - hard fall on the R shoulder Apr 12, 2022 Likely multifactorial.  Obtain Head CT Obtain labs Labs Cont w/PT

## 2022-05-16 NOTE — Assessment & Plan Note (Signed)
Check A1c. 

## 2022-05-16 NOTE — Assessment & Plan Note (Signed)
Will get head CT LS spine X ray

## 2022-05-16 NOTE — Assessment & Plan Note (Signed)
Check UA/Cx

## 2022-05-17 DIAGNOSIS — I6381 Other cerebral infarction due to occlusion or stenosis of small artery: Secondary | ICD-10-CM | POA: Diagnosis not present

## 2022-05-17 DIAGNOSIS — N390 Urinary tract infection, site not specified: Secondary | ICD-10-CM | POA: Diagnosis not present

## 2022-05-17 DIAGNOSIS — R270 Ataxia, unspecified: Secondary | ICD-10-CM | POA: Diagnosis not present

## 2022-05-17 LAB — VITAMIN B12: Vitamin B-12: 1077 pg/mL — ABNORMAL HIGH (ref 211–911)

## 2022-05-17 LAB — COMPREHENSIVE METABOLIC PANEL
ALT: 13 U/L (ref 0–35)
AST: 16 U/L (ref 0–37)
Albumin: 3.9 g/dL (ref 3.5–5.2)
Alkaline Phosphatase: 80 U/L (ref 39–117)
BUN: 18 mg/dL (ref 6–23)
CO2: 28 mEq/L (ref 19–32)
Calcium: 9.2 mg/dL (ref 8.4–10.5)
Chloride: 104 mEq/L (ref 96–112)
Creatinine, Ser: 0.68 mg/dL (ref 0.40–1.20)
GFR: 77.32 mL/min (ref >60.00)
Glucose, Bld: 101 mg/dL — ABNORMAL HIGH (ref 70–99)
Potassium: 4 mEq/L (ref 3.5–5.1)
Sodium: 141 mEq/L (ref 135–145)
Total Bilirubin: 0.4 mg/dL (ref 0.2–1.2)
Total Protein: 7.5 g/dL (ref 6.0–8.3)

## 2022-05-17 LAB — URINALYSIS
Bilirubin Urine: NEGATIVE
Hgb urine dipstick: NEGATIVE
Ketones, ur: NEGATIVE
Leukocytes,Ua: NEGATIVE
Nitrite: NEGATIVE
Specific Gravity, Urine: >=1.03 — AB (ref 1.000–1.030)
Total Protein, Urine: NEGATIVE
Urine Glucose: NEGATIVE
Urobilinogen, UA: 0.2 (ref 0.0–1.0)
pH: 5.5 (ref 5.0–8.0)

## 2022-05-17 LAB — MAGNESIUM: Magnesium: 2 mg/dL (ref 1.5–2.5)

## 2022-05-17 LAB — CBC WITH DIFFERENTIAL/PLATELET
Basophils Absolute: 0 10*3/uL (ref 0.0–0.1)
Basophils Relative: 0.6 % (ref 0.0–3.0)
Eosinophils Absolute: 0.1 10*3/uL (ref 0.0–0.7)
Eosinophils Relative: 2.7 % (ref 0.0–5.0)
HCT: 37.2 % (ref 36.0–46.0)
Hemoglobin: 12.1 g/dL (ref 12.0–15.0)
Lymphocytes Relative: 31.2 % (ref 12.0–46.0)
Lymphs Abs: 1.7 10*3/uL (ref 0.7–4.0)
MCHC: 32.5 g/dL (ref 30.0–36.0)
MCV: 86.1 fl (ref 78.0–100.0)
Monocytes Absolute: 0.5 10*3/uL (ref 0.1–1.0)
Monocytes Relative: 9.2 % (ref 3.0–12.0)
Neutro Abs: 3 10*3/uL (ref 1.4–7.7)
Neutrophils Relative %: 56.3 % (ref 43.0–77.0)
Platelets: 270 10*3/uL (ref 150.0–400.0)
RBC: 4.32 Mil/uL (ref 3.87–5.11)
RDW: 15 % (ref 11.5–15.5)
WBC: 5.3 10*3/uL (ref 4.0–10.5)

## 2022-05-17 LAB — TSH: TSH: 0.92 u[IU]/mL (ref 0.35–5.50)

## 2022-05-17 LAB — T4, FREE: Free T4: 0.86 ng/dL (ref 0.60–1.60)

## 2022-05-17 LAB — AMMONIA: Ammonia: 23 umol/L (ref 11–35)

## 2022-05-17 LAB — CORTISOL: Cortisol, Plasma: 5.1 ug/dL

## 2022-05-19 LAB — CULTURE, URINE COMPREHENSIVE

## 2022-05-20 ENCOUNTER — Telehealth (HOSPITAL_BASED_OUTPATIENT_CLINIC_OR_DEPARTMENT_OTHER): Payer: Self-pay

## 2022-05-20 NOTE — Telephone Encounter (Signed)
Diana Stout called to let us know that she is having severe back problems and didn't think she could do PT at the moment. She went ahead and canceled her appointments for this week, until she finds out what is going on and will let us know if we need to cancel the rest of her appointments or not.

## 2022-05-21 ENCOUNTER — Encounter (HOSPITAL_BASED_OUTPATIENT_CLINIC_OR_DEPARTMENT_OTHER): Payer: PPO

## 2022-05-24 ENCOUNTER — Encounter (HOSPITAL_BASED_OUTPATIENT_CLINIC_OR_DEPARTMENT_OTHER): Payer: PPO

## 2022-05-28 ENCOUNTER — Encounter (HOSPITAL_BASED_OUTPATIENT_CLINIC_OR_DEPARTMENT_OTHER): Payer: Self-pay

## 2022-05-28 ENCOUNTER — Ambulatory Visit (HOSPITAL_BASED_OUTPATIENT_CLINIC_OR_DEPARTMENT_OTHER): Payer: PPO | Attending: Internal Medicine

## 2022-05-28 DIAGNOSIS — R2681 Unsteadiness on feet: Secondary | ICD-10-CM | POA: Diagnosis not present

## 2022-05-28 DIAGNOSIS — M6281 Muscle weakness (generalized): Secondary | ICD-10-CM | POA: Insufficient documentation

## 2022-05-28 DIAGNOSIS — R262 Difficulty in walking, not elsewhere classified: Secondary | ICD-10-CM | POA: Diagnosis not present

## 2022-05-28 DIAGNOSIS — Z9181 History of falling: Secondary | ICD-10-CM | POA: Diagnosis not present

## 2022-05-28 NOTE — Therapy (Signed)
OUTPATIENT PHYSICAL THERAPY THORACOLUMBAR TREATMENT   Patient Name: Diana Stout MRN: [id number] DOB:02-22-1931, 89 y.o., female Today's Date: 05/28/2022  END OF SESSION:  PT End of Session - 05/28/22 1343     Visit Number 11    Number of Visits 20    Date for PT Re-Evaluation 06/04/22    Authorization Type HTA    PT Start Time 1347    PT Stop Time 1428    PT Time Calculation (min) 41 min    Activity Tolerance Patient tolerated treatment well    Behavior During Therapy [facility] for tasks assessed/performed                        Past Medical History:  Diagnosis Date   Abdominal pain, lower 11/22/2010   2015 resolved off Zocor 5/17?urticaria w/GI upset: Dtr is allergic to seafood - the pt is on MegaRed krill oil - d/c MegaRed    Abnormal chest CT 05/22/2011   Followed in Pulmonary clinic/ [facility]/ [place]  05/14/11 CT CHEST WITHOUT CONTRAST 1. Multiple small pulmonary nodules scattered throughout the lungs  bilaterally ranging in size from 3-6 mm.    2. There is a small hiatal hernia.  3. Atherosclerosis.  4. Status post cholecystectomy.  5. Low attenuation hepatic lesions unchanged compared to recent  abdominal CT scan, likely to represent cysts   ANEMIA, NORMOCYTIC 08/10/2009   mild     DIVERTICULOSIS, COLON 03/08/2007   Qualifier: Diagnosis of  By: [name] MD, [name]    Dizziness 07/19/2013   Likely due to diarrhea/meds 4/15    DVT (deep venous thrombosis) ([place]) 1998   hx of right   Dyslipidemia 02/02/2007   Chronic 1/13 worse     Dyspnea 07/01/2008   2/13 improved w/reg exercise 1/17 deconditioning - CL stress test was nl     Edema 09/27/2009   Qualifier: Diagnosis of  By: [name], MD, [name]    ESOPHAGEAL STRICTURE 03/06/2007   Qualifier: Diagnosis of  By: [name] MD, [name]    Esophagitis 2006   Essential hypertension 03/08/2007   Chronic  On Benicar    FOOT PAIN 07/25/2009   R foot OA     GERD 02/02/2007   Wedge pillow Protonix prn, Pepcid -  d/c   History of pneumonia 12/2016   History of shingles    Hyperlipidemia    IBS 03/06/2007   Qualifier: Diagnosis of  By: [name] MD, [name]    Impaired glucose tolerance 11/22/2010   Kidney cysts    left- Dr [name]   Lung nodule 05/08/2011   04/15/11 RLL 4 mm nodule on abd CT - [facility] office    Obesity 01/03/2015   Osteoporosis 02/02/2007   Chronic  Vit D, yoga   Palpitations 05/12/2014   Dr [name] 2/16 poss due to steroid pack    Rash and nonspecific skin eruption 09/12/2015   5/17?urticaria Pt is allergic to seafood - the pt is on MegaRed krill oil - d/c MegaRed    Upper airway cough syndrome 12/08/2014   Dr [name] Flutter valve added 01/02/2015 > resolved 02/03/2015  - rec try off gerd rx p 03/09/15    Past Surgical History:  Procedure Laterality Date   APPENDECTOMY     CHOLECYSTECTOMY     HYSTEROSCOPY WITH RESECTOSCOPE  7/99   resect polyp   MOHS SURGERY  05/2018   face   TUBAL LIGATION Bilateral    Patient Active Problem  List   Diagnosis Date Noted   Falls, sequela 05/16/2022   History of COVID-19 05/16/2022   Ataxia 05/16/2022   Hyperkalemia 06/10/2021   Chronic bronchitis ([place]) 05/30/2021   Lacunar stroke ([place]) 03/08/2021   Prediabetes 01/02/2021   History of CVA (cerebrovascular accident) without residual deficits 01/01/2021   Balance problem 11/29/2020   Acute cystitis with hematuria 01/31/2020   Recurrent UTI 01/31/2020   Low back pain 10/13/2019   CAD (coronary artery disease) 03/04/2018   Lightheadedness 11/05/2017   Canker sore 01/01/2017   Kidney cyst, acquired 07/22/2016   Rash and nonspecific skin eruption 09/12/2015   Obesity 01/03/2015   Upper airway cough syndrome 12/08/2014   Right-sided thoracic back pain 05/12/2014   Palpitations 05/12/2014   Snoring 12/22/2013   Diarrhea 07/19/2013   Dizziness 07/19/2013   Fatigue 10/15/2011   Impaired glucose tolerance 11/22/2010   Well adult exam 11/22/2010   Abdominal pain, lower 11/22/2010   SYNCOPE  09/27/2009   Edema 09/27/2009   Recurrent pneumonia 08/15/2009   ANEMIA, NORMOCYTIC 08/10/2009   SKIN RASH 08/10/2009   FOOT PAIN 07/25/2009   Dyspnea 07/01/2008   Acute bronchitis 06/12/2007   Essential hypertension 03/08/2007   DIVERTICULOSIS, COLON 03/08/2007   ESOPHAGEAL STRICTURE 03/06/2007   IBS 03/06/2007   SHINGLES, HX OF 03/06/2007   Dyslipidemia 02/02/2007   GERD 02/02/2007   Osteoporosis 02/02/2007   DVT, HX OF 02/02/2007    PCP: [name], MD  REFERRING PROVIDER: [name], MD  REFERRING DIAG:  M54.50,G89.29 (ICD-10-CM) - Chronic right-sided low back pain without sciatica  R26.9 (ICD-10-CM) - Gait disorder    Rationale for Evaluation and Treatment: Rehabilitation  THERAPY DIAG:  Muscle weakness (generalized)  Unsteadiness on feet  Difficulty in walking, not elsewhere classified  History of falling  ONSET DATE: 01/14/2022  SUBJECTIVE:                                                                                                                                                                                           SUBJECTIVE STATEMENT:  Pt reports she has missed 4 weeks of PT due to severe low back and having Covid 19. She denies back pain for the last 3 days. Seeing a spine specialist (Dr. Shelda Pal) the first week of March. Also having a CT scan of her brain 3/8 to rule out any neurological cause for balance loss.  PERTINENT HISTORY:    PAIN:  Are you having pain? Yes: NPRS scale: 2/10 Location: neck  Type: pulling feeling  Aggravating factors: turning head  Relieving factors: heat and tylenol, not moving head    PRECAUTIONS: Fall,  very HOH   WEIGHT BEARING RESTRICTIONS: No  FALLS:  Has patient fallen in last 6 months? Yes. Number of falls 1 on the 17th; (+) FOF  (Nov. 17th)  LIVING ENVIRONMENT: Lives with: lives alone but has a daughter that lives 3 minutes away  Lives in: House/apartment Stairs: 5STE with L  ascending rail, no steps inside the home but does have threshold step to get into home  Has following equipment at home: Single point cane  OCCUPATION: retired   PLOF: Independent  PATIENT GOALS: want to walk straight and not keel over   NEXT MD VISIT: referring in 5-6 months   OBJECTIVE:   DIAGNOSTIC FINDINGS:  Per pt had xrays done on the 16th, low back and hips looked good but have some arthritis in the back   PATIENT SURVEYS:  FOTO will do 2nd session  SCREENING FOR RED FLAGS: Bowel or bladder incontinence: Yes: bladder incontinence for 5 years, not associated with back pain  Spinal tumors: No Cauda equina syndrome: No Compression fracture: No Abdominal aneurysm: No  COGNITION: Overall cognitive status: Within functional limits for tasks assessed     SENSATION: WFL Not tested  MUSCLE LENGTH:   POSTURE: rounded shoulders, forward head, decreased lumbar lordosis, and increased thoracic kyphosis  PALPATION:   LUMBAR ROM:   AROM eval  Flexion   Extension   Right lateral flexion   Left lateral flexion   Right rotation   Left rotation    (Blank rows = not tested)  LOWER EXTREMITY ROM:     Active  Right eval Left eval  Hip flexion    Hip extension    Hip abduction    Hip adduction    Hip internal rotation    Hip external rotation    Knee flexion    Knee extension    Ankle dorsiflexion    Ankle plantarflexion    Ankle inversion    Ankle eversion     (Blank rows = not tested)  LOWER EXTREMITY MMT:    MMT Right eval Left eval R 12/29 L 12/29 R 1/16 L 1/16  Hip flexion 3 3 4 $ 4- 4+ 4+  Hip extension        Hip abduction 3+ 3 3+ 4 4+ 4+  Hip adduction        Hip internal rotation        Hip external rotation        Knee flexion 5 4 4+ 4+    Knee extension 5 4 5 $ 4+ 5 5  Ankle dorsiflexion 5 5      Ankle plantarflexion        Ankle inversion        Ankle eversion         (Blank rows = not tested)  LUMBAR SPECIAL TESTS:    FUNCTIONAL  TESTS:  5 times sit to stand: 14 seconds no UEs  3 minute walk test: 460f no rest RPE 4/10 Dynamic Gait Index: 14/24  1/16 5x sit to stand 12 sec no UE use  3 minute walk test 550 no rest RPE 3/10  DGI: 16/24   GAIT: Distance walked: in clinic distances  Assistive device utilized: None Level of assistance: Complete Independence Comments: flexed at hips, short step lengths, stance times, L/R drift increasing with fatigue, + trendelenburg. Gait speed 2.7 ft/sec   TODAY'S TREATMENT:  DATE:   05/10/22:   TherEX  Sidestepping  at rail x2 laps Standing heel/toe raises 2x10 Standing marching 2x10 LAQ 2x10 3" hold   NMR  SLS finger tip support 3x10 seconds solid surface Narrow BOS on blue foam pad 2x20 seconds  Romberg stance EC on floor 2x20 seconds Lateral toe taps off blue foam pad x10 B Forward toe taps off blue foam pad x10 B Backwards toe taps off blue foam pad x10 B Tandem balance 20sec x2ea Romberg on airex with head turns/nods- 30 sec each x2          PATIENT EDUCATION:  Education details: exercise form/purpose  Person educated: Patient Education method: Consulting civil engineer, Demonstration, and Handouts Education comprehension: verbalized understanding, returned demonstration, and needs further education  HOME EXERCISE PROGRAM:  Access Code: [email or web address] URL: [email or web address] Date: 04/26/2022 Prepared by: Deniece Ree  Exercises - Hip Abduction with Resistance Loop  - 2 x daily - 7 x weekly - 1 sets - 10 reps - 1 hold - Sit to Stand with Resistance Around Legs  - 2 x daily - 7 x weekly - 1 sets - 10 reps - Standing Hip Hiking  - 1 x daily - 7 x weekly - 3 sets - 10 reps - Mini Squat with Counter Support  - 1 x daily - 7 x weekly - 3 sets - 10 reps - Single Leg Stance  - 1 x daily - 7 x weekly - 3 sets - 10 reps -  Tandem Stance  - 2 x daily - 7 x weekly - 1 sets - 3 reps - 15-30 hold - Narrow Stance with Counter Support  - 1 x daily - 7 x weekly - 3 sets - 10 reps - Supine Single Knee to Chest Stretch  - 1 x daily - 7 x weekly - 3 sets - 10 reps - Supine Lower Trunk Rotation  - 1 x daily - 7 x weekly - 3 sets - 10 reps - Seated Upper Trapezius Stretch  - 1 x daily - 7 x weekly - 3 sets - 10 reps - Seated Cervical Retraction  - 1 x daily - 7 x weekly - 3 sets - 10 reps - Seated Cervical Retraction and Rotation  - 1 x daily - 7 x weekly - 3 sets - 10 reps  ASSESSMENT:  CLINICAL IMPRESSION:  Regressed program today to accommodate pt's lapse in care and recent health issues. She denied back pain with all exercises. She reports increased difficulty with balance focused tasks compared to previous sessions. Gait belt was utilized, though she only required light CGA with exercises and demonstrated overall good stability. She was most challenged by single leg balance, unable to hold without fingertip support. She also has mild difficulty with head turns/nods while on foam pad.  Educated pt on DOMS and use of ice/heat to manage. Will assess response to tx and modify next session if necessary.   OBJECTIVE IMPAIRMENTS: Abnormal gait, decreased activity tolerance, decreased balance, decreased coordination, decreased mobility, difficulty walking, decreased strength, and postural dysfunction.   ACTIVITY LIMITATIONS: standing, stairs, transfers, and locomotion level  PARTICIPATION LIMITATIONS: shopping, community activity, yard work, and church  PERSONAL FACTORS: Fitness, Social background, and Time since onset of injury/illness/exacerbation are also affecting patient's functional outcome.   REHAB POTENTIAL: Good  CLINICAL DECISION MAKING: Stable/uncomplicated  EVALUATION COMPLEXITY: Low   GOALS: Goals reviewed with patient? Yes  SHORT TERM GOALS: Target date: 04/02/2022    Will be compliant with  appropriate  progressive HEP  Baseline: Goal status: MET 12/29  2.  Will be able to name 3 ways to prevent falls at home and in the community  Baseline:  Goal status: IN PROGRESS 12/29-  needs ongoing education     LONG TERM GOALS: Target date: 04/23/2022    MMT to improve by 1 grade in all weak groups  Baseline:  Goal status: IN PROGRESS 12/29  2.  Will be able to ambulate at least 663f in 3MWT to show improved mobility and community access  Baseline:  Goal status: IN PROGRESS 12/29  3.  Will be able to ambulate community distances with no L/R drift and no feelings of unsteadiness Baseline:  Goal status: MET 12/29  4.  Will score at least 20 on DGI to show improved functional balance skills  Baseline:  Goal status: INITIAL  5.  Will be able to navigate at least 5 steps with no rail and step over step pattern with minimal unsteadiness to show improved balance and community access  Baseline:  Goal status: INITIAL  6.  Will be compliant with appropriate gym based exercise program to maintain functional gains and prevent recurrence of condition  Baseline:  Goal status: INITIAL  PLAN:  PT FREQUENCY: 2x/week  PT DURATION: 6 weeks  PLANNED INTERVENTIONS: Therapeutic exercises, Therapeutic activity, Neuromuscular re-education, Balance training, Gait training, Patient/Family education, Self Care, Joint mobilization, Stair training, DME instructions, Aquatic Therapy, Spinal mobilization, Cryotherapy, Moist heat, Taping, Ultrasound, Ionotophoresis 468mml Dexamethasone, Manual therapy, and Re-evaluation.  PLAN FOR NEXT SESSION: balance and strength focus, HEP updates PRN. Need to work on things like hurdles that force her to clear obstacles   Re[employer] Co[employer]A

## 2022-05-29 ENCOUNTER — Other Ambulatory Visit (HOSPITAL_COMMUNITY): Payer: Self-pay

## 2022-05-29 ENCOUNTER — Encounter: Payer: Self-pay | Admitting: Family Medicine

## 2022-05-29 ENCOUNTER — Ambulatory Visit (INDEPENDENT_AMBULATORY_CARE_PROVIDER_SITE_OTHER): Payer: PPO | Admitting: Family Medicine

## 2022-05-29 VITALS — BP 131/81 | HR 93 | Temp 98.0°F | Ht 59.0 in | Wt 164.6 lb

## 2022-05-29 DIAGNOSIS — N390 Urinary tract infection, site not specified: Secondary | ICD-10-CM

## 2022-05-29 DIAGNOSIS — I10 Essential (primary) hypertension: Secondary | ICD-10-CM

## 2022-05-29 LAB — POCT URINALYSIS DIPSTICK
Bilirubin, UA: NEGATIVE
Blood, UA: NEGATIVE
Glucose, UA: NEGATIVE
Ketones, UA: NEGATIVE
Leukocytes, UA: NEGATIVE
Nitrite, UA: NEGATIVE
Protein, UA: NEGATIVE
Spec Grav, UA: 1.02 (ref 1.010–1.025)
Urobilinogen, UA: 0.2 E.U./dL
pH, UA: 6 (ref 5.0–8.0)

## 2022-05-29 MED ORDER — NITROFURANTOIN MONOHYD MACRO 100 MG PO CAPS
100.0000 mg | ORAL_CAPSULE | Freq: Two times a day (BID) | ORAL | 0 refills | Status: DC
  Filled 2022-05-29: qty 14, 7d supply, fill #0

## 2022-05-29 NOTE — Progress Notes (Signed)
   Diana Stout is a 89 y.o. female who presents today for an office visit.  Assessment/Plan:  Urinary Frequency  POC urinalysis is negative. No red flags or systemic symptoms. Will recheck urine culture today.  Urine culture 2 weeks ago showed Enterococcus however she had less than 10,000 CFU and was not started on antibiotics. We will send a pocket prescription for Macrobid however she will not start this unless she develops worsening symptoms or if her urine culture we are ordering today is positive.  Encouraged hydration.  We discussed reasons to return to care.  Follow-up as needed.    Subjective:  HPI:  See A/p for status of chronic conditions.  Her main concern today is UTI. She has been having issue with urinary frequency for the last several weeks since having covid a montha go. She has also noticed a foul smell to her urine recently. She has noticed more discomfort wiith urination the last few days as well. No fevers or chills. No worsening back pain. She saw her PCP a couple weeks ago and had a culture which showed enterococcus however she was told this was a contaminant.       Objective:  Physical Exam: BP 131/81   Pulse 93   Temp 98 F (36.7 C) (Temporal)   Ht 4' 11"$  (1.499 m)   Wt 164 lb 9.6 oz (74.7 kg)   LMP 04/08/1996   SpO2 96%   BMI 33.25 kg/m   Gen: No acute distress, resting comfortably Neuro: Grossly normal, moves all extremities Psych: Normal affect and thought content      Lauri Till M. Jerline Pain, MD 05/29/2022 10:34 AM

## 2022-05-29 NOTE — Patient Instructions (Signed)
It was very nice to see you today!  You may have a UTI.  We will check a urine culture to confirm this.  I will send an antibiotic.  Please do not start unless you have worsening symptoms.  We will call you with your results in a couple of days.  Take care, Dr Jerline Pain  PLEASE NOTE:  If you had any lab tests, please let us know if you have not heard back within a few days. You may see your results on mychart before we have a chance to review them but we will give you a call once they are reviewed by [place].   If we ordered any referrals today, please let us know if you have not heard from their office within the next week.   If you had any urgent prescriptions sent in today, please check with the pharmacy within an hour of our visit to make sure the prescription was transmitted appropriately.   Please try these tips to maintain a healthy lifestyle:  Eat at least 3 REAL meals and 1-2 snacks per day.  Aim for no more than 5 hours between eating.  If you eat breakfast, please do so within one hour of getting up.   Each meal should contain half fruits/vegetables, one quarter protein, and one quarter carbs (no bigger than a computer mouse)  Cut down on sweet beverages. This includes juice, soda, and sweet tea.   Drink at least 1 glass of water with each meal and aim for at least 8 glasses per day  Exercise at least 150 minutes every week.

## 2022-05-30 LAB — URINE CULTURE
MICRO NUMBER:: 14595344
SPECIMEN QUALITY:: ADEQUATE

## 2022-05-31 ENCOUNTER — Encounter (HOSPITAL_BASED_OUTPATIENT_CLINIC_OR_DEPARTMENT_OTHER): Payer: Self-pay

## 2022-05-31 ENCOUNTER — Ambulatory Visit (HOSPITAL_BASED_OUTPATIENT_CLINIC_OR_DEPARTMENT_OTHER): Payer: PPO

## 2022-05-31 ENCOUNTER — Other Ambulatory Visit (HOSPITAL_COMMUNITY): Payer: Self-pay

## 2022-05-31 DIAGNOSIS — R2681 Unsteadiness on feet: Secondary | ICD-10-CM

## 2022-05-31 DIAGNOSIS — M6281 Muscle weakness (generalized): Secondary | ICD-10-CM | POA: Diagnosis not present

## 2022-05-31 DIAGNOSIS — R262 Difficulty in walking, not elsewhere classified: Secondary | ICD-10-CM

## 2022-05-31 DIAGNOSIS — Z9181 History of falling: Secondary | ICD-10-CM

## 2022-05-31 NOTE — Therapy (Signed)
OUTPATIENT PHYSICAL THERAPY THORACOLUMBAR TREATMENT   Patient Name: Diana Stout MRN: [id number] DOB:1930/10/08, 89 y.o., female Today's Date: 05/31/2022  END OF SESSION:  PT End of Session - 05/31/22 1350     Visit Number 12    Number of Visits 20    Date for PT Re-Evaluation 06/04/22    Authorization Type HTA    Authorization Time Period 03/12/22 to 05/07/22    Progress Note Due on Visit 18    PT Start Time 1350    PT Stop Time 1430    PT Time Calculation (min) 40 min    Equipment Utilized During Treatment Gait belt    Activity Tolerance Patient tolerated treatment well    Behavior During Therapy WFL for tasks assessed/performed                        Past Medical History:  Diagnosis Date   Abdominal pain, lower 11/22/2010   2015 resolved off Zocor 5/17?urticaria w/GI upset: Dtr is allergic to seafood - the pt is on MegaRed krill oil - d/c MegaRed    Abnormal chest CT 05/22/2011   Followed in Pulmonary clinic/ [facility]/ [place]  05/14/11 CT CHEST WITHOUT CONTRAST 1. Multiple small pulmonary nodules scattered throughout the lungs  bilaterally ranging in size from 3-6 mm.    2. There is a small hiatal hernia.  3. Atherosclerosis.  4. Status post cholecystectomy.  5. Low attenuation hepatic lesions unchanged compared to recent  abdominal CT scan, likely to represent cysts   ANEMIA, NORMOCYTIC 08/10/2009   mild     DIVERTICULOSIS, COLON 03/08/2007   Qualifier: Diagnosis of  By: [name] MD, [name]    Dizziness 07/19/2013   Likely due to diarrhea/meds 4/15    DVT (deep venous thrombosis) ([place]) 1998   hx of right   Dyslipidemia 02/02/2007   Chronic 1/13 worse     Dyspnea 07/01/2008   2/13 improved w/reg exercise 1/17 deconditioning - CL stress test was nl     Edema 09/27/2009   Qualifier: Diagnosis of  By: [name], MD, [name]    ESOPHAGEAL STRICTURE 03/06/2007   Qualifier: Diagnosis of  By: [name] MD, [name]    Esophagitis 2006   Essential  hypertension 03/08/2007   Chronic  On Benicar    FOOT PAIN 07/25/2009   R foot OA     GERD 02/02/2007   Wedge pillow Protonix prn, Pepcid - d/c   History of pneumonia 12/2016   History of shingles    Hyperlipidemia    IBS 03/06/2007   Qualifier: Diagnosis of  By: [name] MD, [name]    Impaired glucose tolerance 11/22/2010   Kidney cysts    left- Dr [name]   Lung nodule 05/08/2011   04/15/11 RLL 4 mm nodule on abd CT - [facility] office    Obesity 01/03/2015   Osteoporosis 02/02/2007   Chronic  Vit D, yoga   Palpitations 05/12/2014   Dr [name] 2/16 poss due to steroid pack    Rash and nonspecific skin eruption 09/12/2015   5/17?urticaria Pt is allergic to seafood - the pt is on MegaRed krill oil - d/c MegaRed    Upper airway cough syndrome 12/08/2014   Dr [name] Flutter valve added 01/02/2015 > resolved 02/03/2015  - rec try off gerd rx p 03/09/15    Past Surgical History:  Procedure Laterality Date   APPENDECTOMY     CHOLECYSTECTOMY     HYSTEROSCOPY WITH  RESECTOSCOPE  7/99   resect polyp   MOHS SURGERY  05/2018   face   TUBAL LIGATION Bilateral    Patient Active Problem List   Diagnosis Date Noted   Falls, sequela 05/16/2022   History of COVID-19 05/16/2022   Ataxia 05/16/2022   Hyperkalemia 06/10/2021   Chronic bronchitis ([place]) 05/30/2021   Lacunar stroke ([place]) 03/08/2021   Prediabetes 01/02/2021   History of CVA (cerebrovascular accident) without residual deficits 01/01/2021   Balance problem 11/29/2020   Acute cystitis with hematuria 01/31/2020   Recurrent UTI 01/31/2020   Low back pain 10/13/2019   CAD (coronary artery disease) 03/04/2018   Lightheadedness 11/05/2017   Canker sore 01/01/2017   Kidney cyst, acquired 07/22/2016   Rash and nonspecific skin eruption 09/12/2015   Obesity 01/03/2015   Upper airway cough syndrome 12/08/2014   Right-sided thoracic back pain 05/12/2014   Palpitations 05/12/2014   Snoring 12/22/2013   Diarrhea 07/19/2013   Dizziness 07/19/2013    Fatigue 10/15/2011   Impaired glucose tolerance 11/22/2010   Well adult exam 11/22/2010   Abdominal pain, lower 11/22/2010   SYNCOPE 09/27/2009   Edema 09/27/2009   Recurrent pneumonia 08/15/2009   ANEMIA, NORMOCYTIC 08/10/2009   SKIN RASH 08/10/2009   FOOT PAIN 07/25/2009   Dyspnea 07/01/2008   Acute bronchitis 06/12/2007   Essential hypertension 03/08/2007   DIVERTICULOSIS, COLON 03/08/2007   ESOPHAGEAL STRICTURE 03/06/2007   IBS 03/06/2007   SHINGLES, HX OF 03/06/2007   Dyslipidemia 02/02/2007   GERD 02/02/2007   Osteoporosis 02/02/2007   DVT, HX OF 02/02/2007    PCP: [name], MD  REFERRING PROVIDER: [name], MD  REFERRING DIAG:  M54.50,G89.29 (ICD-10-CM) - Chronic right-sided low back pain without sciatica  R26.9 (ICD-10-CM) - Gait disorder    Rationale for Evaluation and Treatment: Rehabilitation  THERAPY DIAG:  Muscle weakness (generalized)  Unsteadiness on feet  Difficulty in walking, not elsewhere classified  History of falling  ONSET DATE: 01/14/2022  SUBJECTIVE:                                                                                                                                                                                           SUBJECTIVE STATEMENT:  Pt reports she has been trying to walk more in driveway since last visit.    Seeing a spine specialist (Dr. Shelda Pal) the first week of March. Also having a CT scan of her brain 3/8 to rule out any neurological cause for balance loss.  PERTINENT HISTORY:    PAIN:  Are you having pain? Yes: NPRS scale: 2/10 Location: neck  Type: pulling feeling  Aggravating  factors: turning head  Relieving factors: heat and tylenol, not moving head    PRECAUTIONS: Fall, very HOH   WEIGHT BEARING RESTRICTIONS: No  FALLS:  Has patient fallen in last 6 months? Yes. Number of falls 1 on the 17th; (+) FOF  (Nov. 17th)  LIVING ENVIRONMENT: Lives with: lives alone but has a  daughter that lives 3 minutes away  Lives in: House/apartment Stairs: 5STE with L ascending rail, no steps inside the home but does have threshold step to get into home  Has following equipment at home: Single point cane  OCCUPATION: retired   PLOF: Independent  PATIENT GOALS: want to walk straight and not keel over   NEXT MD VISIT: referring in 5-6 months   OBJECTIVE:   DIAGNOSTIC FINDINGS:  Per pt had xrays done on the 16th, low back and hips looked good but have some arthritis in the back   PATIENT SURVEYS:  FOTO will do 2nd session  SCREENING FOR RED FLAGS: Bowel or bladder incontinence: Yes: bladder incontinence for 5 years, not associated with back pain  Spinal tumors: No Cauda equina syndrome: No Compression fracture: No Abdominal aneurysm: No  COGNITION: Overall cognitive status: Within functional limits for tasks assessed     SENSATION: WFL Not tested  MUSCLE LENGTH:   POSTURE: rounded shoulders, forward head, decreased lumbar lordosis, and increased thoracic kyphosis  PALPATION:   LUMBAR ROM:   AROM eval  Flexion   Extension   Right lateral flexion   Left lateral flexion   Right rotation   Left rotation    (Blank rows = not tested)  LOWER EXTREMITY ROM:     Active  Right eval Left eval  Hip flexion    Hip extension    Hip abduction    Hip adduction    Hip internal rotation    Hip external rotation    Knee flexion    Knee extension    Ankle dorsiflexion    Ankle plantarflexion    Ankle inversion    Ankle eversion     (Blank rows = not tested)  LOWER EXTREMITY MMT:    MMT Right eval Left eval R 12/29 L 12/29 R 1/16 L 1/16  Hip flexion '3 3 4 '$ 4- 4+ 4+  Hip extension        Hip abduction 3+ 3 3+ 4 4+ 4+  Hip adduction        Hip internal rotation        Hip external rotation        Knee flexion 5 4 4+ 4+    Knee extension '5 4 5 '$ 4+ 5 5  Ankle dorsiflexion 5 5      Ankle plantarflexion        Ankle inversion        Ankle  eversion         (Blank rows = not tested)  LUMBAR SPECIAL TESTS:    FUNCTIONAL TESTS:  5 times sit to stand: 14 seconds no UEs  3 minute walk test: 435f no rest RPE 4/10 Dynamic Gait Index: 14/24  1/16 5x sit to stand 12 sec no UE use  3 minute walk test 550 no rest RPE 3/10  DGI: 16/24   GAIT: Distance walked: in clinic distances  Assistive device utilized: None Level of assistance: Complete Independence Comments: flexed at hips, short step lengths, stance times, L/R drift increasing with fatigue, + trendelenburg. Gait speed 2.7 ft/sec   TODAY'S TREATMENT:  DATE:   05/31/22:   TherEX  Sidestepping  at rail x2 laps Standing heel/toe raises 2x10 Standing marching 2x10 LAQ 2x10 2# 3" hold   NMR  SLS finger tip support 2x20 seconds ea solid surface Narrow BOS on blue foam pad 2x30 seconds  Romberg stance EC on floor 2x30 seconds Lateral toe taps off blue foam pad x10 B Forward toe taps off blue foam pad x10 B Backwards toe taps off blue foam pad x10 B Tandem balance 30sec x2ea Marching on airex- 2x10 no UE support.           PATIENT EDUCATION:  Education details: exercise form/purpose  Person educated: Patient Education method: Explanation, Demonstration, and Handouts Education comprehension: verbalized understanding, returned demonstration, and needs further education  HOME EXERCISE PROGRAM:  Access Code: [email or web address] URL: [email or web address] Date: 04/26/2022 Prepared by: Deniece Ree  Exercises - Hip Abduction with Resistance Loop  - 2 x daily - 7 x weekly - 1 sets - 10 reps - 1 hold - Sit to Stand with Resistance Around Legs  - 2 x daily - 7 x weekly - 1 sets - 10 reps - Standing Hip Hiking  - 1 x daily - 7 x weekly - 3 sets - 10 reps - Mini Squat with Counter Support  - 1 x daily - 7 x weekly - 3 sets - 10  reps - Single Leg Stance  - 1 x daily - 7 x weekly - 3 sets - 10 reps - Tandem Stance  - 2 x daily - 7 x weekly - 1 sets - 3 reps - 15-30 hold - Narrow Stance with Counter Support  - 1 x daily - 7 x weekly - 3 sets - 10 reps - Supine Single Knee to Chest Stretch  - 1 x daily - 7 x weekly - 3 sets - 10 reps - Supine Lower Trunk Rotation  - 1 x daily - 7 x weekly - 3 sets - 10 reps - Seated Upper Trapezius Stretch  - 1 x daily - 7 x weekly - 3 sets - 10 reps - Seated Cervical Retraction  - 1 x daily - 7 x weekly - 3 sets - 10 reps - Seated Cervical Retraction and Rotation  - 1 x daily - 7 x weekly - 3 sets - 10 reps  ASSESSMENT:  CLINICAL IMPRESSION: Adjusted SPC height as is was too high for pt. Able to ambule 350' in clinic without AD though she does demonstrate mild shuffling. She demonstrates improved balance compared to last visit. Most challenged by SLS and tandem positions. Pt will benefit from continued PT due to her recent regression due to lapse in care after period of severe back pain.   OBJECTIVE IMPAIRMENTS: Abnormal gait, decreased activity tolerance, decreased balance, decreased coordination, decreased mobility, difficulty walking, decreased strength, and postural dysfunction.   ACTIVITY LIMITATIONS: standing, stairs, transfers, and locomotion level  PARTICIPATION LIMITATIONS: shopping, community activity, yard work, and church  PERSONAL FACTORS: Fitness, Social background, and Time since onset of injury/illness/exacerbation are also affecting patient's functional outcome.   REHAB POTENTIAL: Good  CLINICAL DECISION MAKING: Stable/uncomplicated  EVALUATION COMPLEXITY: Low   GOALS: Goals reviewed with patient? Yes  SHORT TERM GOALS: Target date: 04/02/2022    Will be compliant with appropriate progressive HEP  Baseline: Goal status: MET 12/29  2.  Will be able to name 3 ways to prevent falls at home and in the community  Baseline:  Goal status: IN PROGRESS 12/29-   needs  ongoing education     LONG TERM GOALS: Target date: 04/23/2022    MMT to improve by 1 grade in all weak groups  Baseline:  Goal status: IN PROGRESS 12/29  2.  Will be able to ambulate at least 684f in 3MWT to show improved mobility and community access  Baseline:  Goal status: IN PROGRESS 12/29  3.  Will be able to ambulate community distances with no L/R drift and no feelings of unsteadiness Baseline:  Goal status: MET 12/29  4.  Will score at least 20 on DGI to show improved functional balance skills  Baseline:  Goal status: INITIAL  5.  Will be able to navigate at least 5 steps with no rail and step over step pattern with minimal unsteadiness to show improved balance and community access  Baseline:  Goal status: INITIAL  6.  Will be compliant with appropriate gym based exercise program to maintain functional gains and prevent recurrence of condition  Baseline:  Goal status: INITIAL  PLAN:  PT FREQUENCY: 2x/week  PT DURATION: 6 weeks  PLANNED INTERVENTIONS: Therapeutic exercises, Therapeutic activity, Neuromuscular re-education, Balance training, Gait training, Patient/Family education, Self Care, Joint mobilization, Stair training, DME instructions, Aquatic Therapy, Spinal mobilization, Cryotherapy, Moist heat, Taping, Ultrasound, Ionotophoresis '4mg'$ /ml Dexamethasone, Manual therapy, and Re-evaluation.  PLAN FOR NEXT SESSION: balance and strength focus, HEP updates PRN. Need to work on things like hurdles that force her to clear obstacles   [employer] PTA

## 2022-05-31 NOTE — Progress Notes (Signed)
Please inform patient of the following:  Her urine culture is negative.  She does not need antibiotics.  Recommend she follow-up with her PCP if still having symptoms.

## 2022-06-04 ENCOUNTER — Ambulatory Visit (HOSPITAL_BASED_OUTPATIENT_CLINIC_OR_DEPARTMENT_OTHER): Payer: PPO

## 2022-06-04 ENCOUNTER — Encounter (HOSPITAL_BASED_OUTPATIENT_CLINIC_OR_DEPARTMENT_OTHER): Payer: Self-pay

## 2022-06-04 DIAGNOSIS — R2681 Unsteadiness on feet: Secondary | ICD-10-CM

## 2022-06-04 DIAGNOSIS — M6281 Muscle weakness (generalized): Secondary | ICD-10-CM

## 2022-06-04 DIAGNOSIS — Z9181 History of falling: Secondary | ICD-10-CM

## 2022-06-04 DIAGNOSIS — R262 Difficulty in walking, not elsewhere classified: Secondary | ICD-10-CM

## 2022-06-04 NOTE — Therapy (Signed)
OUTPATIENT PHYSICAL THERAPY THORACOLUMBAR TREATMENT   Patient Name: Diana Stout MRN: [id number] DOB:1931/01/04, 89 y.o., female Today's Date: 06/04/2022  END OF SESSION:  PT End of Session - 06/04/22 1339     Visit Number 13    Number of Visits 20    Date for PT Re-Evaluation 06/04/22    Authorization Type HTA    Authorization Time Period 03/12/22 to 05/07/22    Progress Note Due on Visit 18    PT Start Time 1340    PT Stop Time 1426    PT Time Calculation (min) 46 min    Equipment Utilized During Treatment Gait belt    Activity Tolerance Patient tolerated treatment well    Behavior During Therapy WFL for tasks assessed/performed                        Past Medical History:  Diagnosis Date   Abdominal pain, lower 11/22/2010   2015 resolved off Zocor 5/17?urticaria w/GI upset: Dtr is allergic to seafood - the pt is on MegaRed krill oil - d/c MegaRed    Abnormal chest CT 05/22/2011   Followed in Pulmonary clinic/ [facility]/ [place]  05/14/11 CT CHEST WITHOUT CONTRAST 1. Multiple small pulmonary nodules scattered throughout the lungs  bilaterally ranging in size from 3-6 mm.    2. There is a small hiatal hernia.  3. Atherosclerosis.  4. Status post cholecystectomy.  5. Low attenuation hepatic lesions unchanged compared to recent  abdominal CT scan, likely to represent cysts   ANEMIA, NORMOCYTIC 08/10/2009   mild     DIVERTICULOSIS, COLON 03/08/2007   Qualifier: Diagnosis of  By: [name] MD, [name]    Dizziness 07/19/2013   Likely due to diarrhea/meds 4/15    DVT (deep venous thrombosis) ([place]) 1998   hx of right   Dyslipidemia 02/02/2007   Chronic 1/13 worse     Dyspnea 07/01/2008   2/13 improved w/reg exercise 1/17 deconditioning - CL stress test was nl     Edema 09/27/2009   Qualifier: Diagnosis of  By: [name], MD, [name]    ESOPHAGEAL STRICTURE 03/06/2007   Qualifier: Diagnosis of  By: [name] MD, [name]    Esophagitis 2006   Essential  hypertension 03/08/2007   Chronic  On Benicar    FOOT PAIN 07/25/2009   R foot OA     GERD 02/02/2007   Wedge pillow Protonix prn, Pepcid - d/c   History of pneumonia 12/2016   History of shingles    Hyperlipidemia    IBS 03/06/2007   Qualifier: Diagnosis of  By: [name] MD, [name]    Impaired glucose tolerance 11/22/2010   Kidney cysts    left- Dr [name]   Lung nodule 05/08/2011   04/15/11 RLL 4 mm nodule on abd CT - [facility] office    Obesity 01/03/2015   Osteoporosis 02/02/2007   Chronic  Vit D, yoga   Palpitations 05/12/2014   Dr [name] 2/16 poss due to steroid pack    Rash and nonspecific skin eruption 09/12/2015   5/17?urticaria Pt is allergic to seafood - the pt is on MegaRed krill oil - d/c MegaRed    Upper airway cough syndrome 12/08/2014   Dr [name] Flutter valve added 01/02/2015 > resolved 02/03/2015  - rec try off gerd rx p 03/09/15    Past Surgical History:  Procedure Laterality Date   APPENDECTOMY     CHOLECYSTECTOMY     HYSTEROSCOPY WITH  RESECTOSCOPE  7/99   resect polyp   MOHS SURGERY  05/2018   face   TUBAL LIGATION Bilateral    Patient Active Problem List   Diagnosis Date Noted   Falls, sequela 05/16/2022   History of COVID-19 05/16/2022   Ataxia 05/16/2022   Hyperkalemia 06/10/2021   Chronic bronchitis ([place]) 05/30/2021   Lacunar stroke ([place]) 03/08/2021   Prediabetes 01/02/2021   History of CVA (cerebrovascular accident) without residual deficits 01/01/2021   Balance problem 11/29/2020   Acute cystitis with hematuria 01/31/2020   Recurrent UTI 01/31/2020   Low back pain 10/13/2019   CAD (coronary artery disease) 03/04/2018   Lightheadedness 11/05/2017   Canker sore 01/01/2017   Kidney cyst, acquired 07/22/2016   Rash and nonspecific skin eruption 09/12/2015   Obesity 01/03/2015   Upper airway cough syndrome 12/08/2014   Right-sided thoracic back pain 05/12/2014   Palpitations 05/12/2014   Snoring 12/22/2013   Diarrhea 07/19/2013   Dizziness 07/19/2013    Fatigue 10/15/2011   Impaired glucose tolerance 11/22/2010   Well adult exam 11/22/2010   Abdominal pain, lower 11/22/2010   SYNCOPE 09/27/2009   Edema 09/27/2009   Recurrent pneumonia 08/15/2009   ANEMIA, NORMOCYTIC 08/10/2009   SKIN RASH 08/10/2009   FOOT PAIN 07/25/2009   Dyspnea 07/01/2008   Acute bronchitis 06/12/2007   Essential hypertension 03/08/2007   DIVERTICULOSIS, COLON 03/08/2007   ESOPHAGEAL STRICTURE 03/06/2007   IBS 03/06/2007   SHINGLES, HX OF 03/06/2007   Dyslipidemia 02/02/2007   GERD 02/02/2007   Osteoporosis 02/02/2007   DVT, HX OF 02/02/2007    PCP: Plotnikov, [name], MD  REFERRING PROVIDER: [name], MD  REFERRING DIAG:  M54.50,G89.29 (ICD-10-CM) - Chronic right-sided low back pain without sciatica  R26.9 (ICD-10-CM) - Gait disorder    Rationale for Evaluation and Treatment: Rehabilitation  THERAPY DIAG:  Unsteadiness on feet  History of falling  Difficulty in walking, not elsewhere classified  Muscle weakness (generalized)  ONSET DATE: 01/14/2022  SUBJECTIVE:                                                                                                                                                                                           SUBJECTIVE STATEMENT:  Pt reports her back is not bothering today. The pain is intermittent with bending movements, sharp in nature. Sees MD on Monday to have back checked out. Has brain CT on next Friday to rule out any concerns that may be affecting her balance. D;oing well with cane which was shortened last visit to more appropriate height. She uses cane about 50% in the house.   PERTINENT HISTORY:  PAIN:  Are you having pain? No: NPRS scale: 0/10 Location:  Type: pulling feeling  Aggravating factors: turning head  Relieving factors: heat and tylenol, not moving head    PRECAUTIONS: Fall, very HOH   WEIGHT BEARING RESTRICTIONS: No  FALLS:  Has patient fallen in last 6  months? Yes. Number of falls 1 on the 17th; (+) FOF  (Nov. 17th)  LIVING ENVIRONMENT: Lives with: lives alone but has a daughter that lives 3 minutes away  Lives in: House/apartment Stairs: 5STE with L ascending rail, no steps inside the home but does have threshold step to get into home  Has following equipment at home: Single point cane  OCCUPATION: retired   PLOF: Independent  PATIENT GOALS: want to walk straight and not keel over   NEXT MD VISIT: referring in 5-6 months   OBJECTIVE:   DIAGNOSTIC FINDINGS:  Per pt had xrays done on the 16th, low back and hips looked good but have some arthritis in the back   PATIENT SURVEYS:  FOTO will do 2nd session  SCREENING FOR RED FLAGS: Bowel or bladder incontinence: Yes: bladder incontinence for 5 years, not associated with back pain  Spinal tumors: No Cauda equina syndrome: No Compression fracture: No Abdominal aneurysm: No  COGNITION: Overall cognitive status: Within functional limits for tasks assessed     SENSATION: WFL Not tested  MUSCLE LENGTH:   POSTURE: rounded shoulders, forward head, decreased lumbar lordosis, and increased thoracic kyphosis  PALPATION:   LUMBAR ROM:   AROM eval  Flexion   Extension   Right lateral flexion   Left lateral flexion   Right rotation   Left rotation    (Blank rows = not tested)  LOWER EXTREMITY ROM:     Active  Right eval Left eval  Hip flexion    Hip extension    Hip abduction    Hip adduction    Hip internal rotation    Hip external rotation    Knee flexion    Knee extension    Ankle dorsiflexion    Ankle plantarflexion    Ankle inversion    Ankle eversion     (Blank rows = not tested)  LOWER EXTREMITY MMT:    MMT Right eval Left eval R 12/29 L 12/29 R 1/16 L 1/16  Hip flexion '3 3 4 '$ 4- 4+ 4+  Hip extension        Hip abduction 3+ 3 3+ 4 4+ 4+  Hip adduction        Hip internal rotation        Hip external rotation        Knee flexion 5 4 4+ 4+     Knee extension '5 4 5 '$ 4+ 5 5  Ankle dorsiflexion 5 5      Ankle plantarflexion        Ankle inversion        Ankle eversion         (Blank rows = not tested)  LUMBAR SPECIAL TESTS:    FUNCTIONAL TESTS:  5 times sit to stand: 14 seconds no UEs  3 minute walk test: 437f no rest RPE 4/10 Dynamic Gait Index: 14/24  1/16 5x sit to stand 12 sec no UE use  3 minute walk test 550 no rest RPE 3/10  DGI: 16/24   GAIT: Distance walked: in clinic distances  Assistive device utilized: None Level of assistance: Complete Independence Comments: flexed at hips, short step lengths, stance times, L/R drift increasing  with fatigue, + trendelenburg. Gait speed 2.7 ft/sec   TODAY'S TREATMENT:                                                                                                                              DATE:   06/04/22:   TherEX  Sidestepping  in hall- 1/2 hall x1lap Standing heel/toe raises 2x10, 2.5# Standing marching 2x10-2.5# LAQ 2x10 2.5# 3" hold Standing hip abduction 2.5# 2x10bil   NMR  SLS finger tip support 2x20 seconds ea solid surface Narrow BOS on blue foam pad 2x30 seconds  Romberg stance EC on airex 2x30 seconds FA on airex EC 30secx2 Tandem walking on long airex x3laps CGA Sidestepping on long airex x3 laps CGA Hurdles fwd and lateral-4 hurdles x3laps ea light CGA Cone weaving 2x8 cones, light CGA Gait with head turns/nods in hall- x1 ea. Light CGA Tandem balance 30sec x1 ea on 1/2 roll (flat side down) Marching on airex- 2x10 no UE support.  Toe taps at 8" step no Ues x20          PATIENT EDUCATION:  Education details: exercise form/purpose  Person educated: Patient Education method: Explanation, Demonstration, and Handouts Education comprehension: verbalized understanding, returned demonstration, and needs further education  HOME EXERCISE PROGRAM:  Access Code: [email or web address] URL: [email or web address] Date:  04/26/2022 Prepared by: Deniece Ree  Exercises - Hip Abduction with Resistance Loop  - 2 x daily - 7 x weekly - 1 sets - 10 reps - 1 hold - Sit to Stand with Resistance Around Legs  - 2 x daily - 7 x weekly - 1 sets - 10 reps - Standing Hip Hiking  - 1 x daily - 7 x weekly - 3 sets - 10 reps - Mini Squat with Counter Support  - 1 x daily - 7 x weekly - 3 sets - 10 reps - Single Leg Stance  - 1 x daily - 7 x weekly - 3 sets - 10 reps - Tandem Stance  - 2 x daily - 7 x weekly - 1 sets - 3 reps - 15-30 hold - Narrow Stance with Counter Support  - 1 x daily - 7 x weekly - 3 sets - 10 reps - Supine Single Knee to Chest Stretch  - 1 x daily - 7 x weekly - 3 sets - 10 reps - Supine Lower Trunk Rotation  - 1 x daily - 7 x weekly - 3 sets - 10 reps - Seated Upper Trapezius Stretch  - 1 x daily - 7 x weekly - 3 sets - 10 reps - Seated Cervical Retraction  - 1 x daily - 7 x weekly - 3 sets - 10 reps - Seated Cervical Retraction and Rotation  - 1 x daily - 7 x weekly - 3 sets - 10 reps  ASSESSMENT:  CLINICAL IMPRESSION: Pt demonstrates improved balance compared to last visit. She remains challenged by trendelenberg fault due to glute medius weakness which  is evident especially with standing hip abduction and single leg stance. Tactile cues utilized to help improve awareness and correction of this. She does demonstrate improved pelvic control with gait pattern compared to previous sessions. She has been practicing side stepping at home to help strengthen this. Also completed standing hip abduction with resistance today. Pt experienced no LOB and minimal unsteadiness with balance challenges. She is most challenged by SLS and tandem on compliant surface.   OBJECTIVE IMPAIRMENTS: Abnormal gait, decreased activity tolerance, decreased balance, decreased coordination, decreased mobility, difficulty walking, decreased strength, and postural dysfunction.   ACTIVITY LIMITATIONS: standing, stairs, transfers, and  locomotion level  PARTICIPATION LIMITATIONS: shopping, community activity, yard work, and church  PERSONAL FACTORS: Fitness, Social background, and Time since onset of injury/illness/exacerbation are also affecting patient's functional outcome.   REHAB POTENTIAL: Good  CLINICAL DECISION MAKING: Stable/uncomplicated  EVALUATION COMPLEXITY: Low   GOALS: Goals reviewed with patient? No  SHORT TERM GOALS: Target date: 04/02/2022    Will be compliant with appropriate progressive HEP  Baseline: Goal status: MET 12/29  2.  Will be able to name 3 ways to prevent falls at home and in the community  Baseline:  Goal status: IN PROGRESS 12/29-  needs ongoing education     LONG TERM GOALS: Target date: 04/23/2022    MMT to improve by 1 grade in all weak groups  Baseline:  Goal status: IN PROGRESS 12/29  2.  Will be able to ambulate at least 652f in 3MWT to show improved mobility and community access  Baseline:  Goal status: IN PROGRESS 12/29  3.  Will be able to ambulate community distances with no L/R drift and no feelings of unsteadiness Baseline:  Goal status: MET 12/29  4.  Will score at least 20 on DGI to show improved functional balance skills  Baseline:  Goal status: INITIAL  5.  Will be able to navigate at least 5 steps with no rail and step over step pattern with minimal unsteadiness to show improved balance and community access  Baseline:  Goal status: INITIAL  6.  Will be compliant with appropriate gym based exercise program to maintain functional gains and prevent recurrence of condition  Baseline:  Goal status: INITIAL  PLAN:  PT FREQUENCY: 2x/week  PT DURATION: 6 weeks  PLANNED INTERVENTIONS: Therapeutic exercises, Therapeutic activity, Neuromuscular re-education, Balance training, Gait training, Patient/Family education, Self Care, Joint mobilization, Stair training, DME instructions, Aquatic Therapy, Spinal mobilization, Cryotherapy, Moist heat,  Taping, Ultrasound, Ionotophoresis '4mg'$ /ml Dexamethasone, Manual therapy, and Re-evaluation.  PLAN FOR NEXT SESSION: balance and strength focus, HEP updates PRN. Need to work on things like hurdles that force her to clear obstacles   [employer] PTA

## 2022-06-07 ENCOUNTER — Ambulatory Visit (HOSPITAL_BASED_OUTPATIENT_CLINIC_OR_DEPARTMENT_OTHER): Payer: PPO | Attending: Internal Medicine | Admitting: Physical Therapy

## 2022-06-07 ENCOUNTER — Telehealth (HOSPITAL_BASED_OUTPATIENT_CLINIC_OR_DEPARTMENT_OTHER): Payer: Self-pay | Admitting: Physical Therapy

## 2022-06-07 DIAGNOSIS — R2681 Unsteadiness on feet: Secondary | ICD-10-CM | POA: Insufficient documentation

## 2022-06-07 DIAGNOSIS — Z9181 History of falling: Secondary | ICD-10-CM | POA: Insufficient documentation

## 2022-06-07 DIAGNOSIS — M6281 Muscle weakness (generalized): Secondary | ICD-10-CM | POA: Insufficient documentation

## 2022-06-07 DIAGNOSIS — R262 Difficulty in walking, not elsewhere classified: Secondary | ICD-10-CM | POA: Insufficient documentation

## 2022-06-07 NOTE — Telephone Encounter (Signed)
Spoke with patient regarding missed visit. The patient had the wrong time written down. The patient will call back next week following her appointment with her back Dr.

## 2022-06-10 DIAGNOSIS — M5451 Vertebrogenic low back pain: Secondary | ICD-10-CM | POA: Diagnosis not present

## 2022-06-10 DIAGNOSIS — M5136 Other intervertebral disc degeneration, lumbar region: Secondary | ICD-10-CM | POA: Diagnosis not present

## 2022-06-12 ENCOUNTER — Ambulatory Visit (HOSPITAL_BASED_OUTPATIENT_CLINIC_OR_DEPARTMENT_OTHER): Payer: PPO | Admitting: Physical Therapy

## 2022-06-12 ENCOUNTER — Encounter (HOSPITAL_BASED_OUTPATIENT_CLINIC_OR_DEPARTMENT_OTHER): Payer: Self-pay | Admitting: Physical Therapy

## 2022-06-12 DIAGNOSIS — R2681 Unsteadiness on feet: Secondary | ICD-10-CM | POA: Diagnosis not present

## 2022-06-12 DIAGNOSIS — M6281 Muscle weakness (generalized): Secondary | ICD-10-CM

## 2022-06-12 DIAGNOSIS — Z9181 History of falling: Secondary | ICD-10-CM

## 2022-06-12 DIAGNOSIS — R262 Difficulty in walking, not elsewhere classified: Secondary | ICD-10-CM | POA: Diagnosis not present

## 2022-06-12 NOTE — Therapy (Signed)
OUTPATIENT PHYSICAL THERAPY THORACOLUMBAR TREATMENT   Patient Name: Diana Stout MRN: [id number] DOB:08-08-1930, 89 y.o., female Today's Date: 06/12/2022  END OF SESSION:  PT End of Session - 06/12/22 1347     Visit Number 14    Number of Visits 20    Date for PT Re-Evaluation 06/04/22    Authorization Type HTA    PT Start Time 1345    PT Stop Time 1428    PT Time Calculation (min) 43 min    Activity Tolerance Patient tolerated treatment well    Behavior During Therapy WFL for tasks assessed/performed                         Past Medical History:  Diagnosis Date   Abdominal pain, lower 11/22/2010   2015 resolved off Zocor 5/17?urticaria w/GI upset: Dtr is allergic to seafood - the pt is on MegaRed krill oil - d/c MegaRed    Abnormal chest CT 05/22/2011   Followed in Pulmonary clinic/ [facility]/ [place]  05/14/11 CT CHEST WITHOUT CONTRAST 1. Multiple small pulmonary nodules scattered throughout the lungs  bilaterally ranging in size from 3-6 mm.    2. There is a small hiatal hernia.  3. Atherosclerosis.  4. Status post cholecystectomy.  5. Low attenuation hepatic lesions unchanged compared to recent  abdominal CT scan, likely to represent cysts   ANEMIA, NORMOCYTIC 08/10/2009   mild     DIVERTICULOSIS, COLON 03/08/2007   Qualifier: Diagnosis of  By: [name] MD, [name]    Dizziness 07/19/2013   Likely due to diarrhea/meds 4/15    DVT (deep venous thrombosis) ([place]) 1998   hx of right   Dyslipidemia 02/02/2007   Chronic 1/13 worse     Dyspnea 07/01/2008   2/13 improved w/reg exercise 1/17 deconditioning - CL stress test was nl     Edema 09/27/2009   Qualifier: Diagnosis of  By: [name], MD, [name]    ESOPHAGEAL STRICTURE 03/06/2007   Qualifier: Diagnosis of  By: [name] MD, [name]    Esophagitis 2006   Essential hypertension 03/08/2007   Chronic  On Benicar    FOOT PAIN 07/25/2009   R foot OA     GERD 02/02/2007   Wedge pillow Protonix prn, Pepcid -  d/c   History of pneumonia 12/2016   History of shingles    Hyperlipidemia    IBS 03/06/2007   Qualifier: Diagnosis of  By: [name] MD, [name]    Impaired glucose tolerance 11/22/2010   Kidney cysts    left- Dr [name]   Lung nodule 05/08/2011   04/15/11 RLL 4 mm nodule on abd CT - [facility] office    Obesity 01/03/2015   Osteoporosis 02/02/2007   Chronic  Vit D, yoga   Palpitations 05/12/2014   Dr [name] 2/16 poss due to steroid pack    Rash and nonspecific skin eruption 09/12/2015   5/17?urticaria Pt is allergic to seafood - the pt is on MegaRed krill oil - d/c MegaRed    Upper airway cough syndrome 12/08/2014   Dr [name] Flutter valve added 01/02/2015 > resolved 02/03/2015  - rec try off gerd rx p 03/09/15    Past Surgical History:  Procedure Laterality Date   APPENDECTOMY     CHOLECYSTECTOMY     HYSTEROSCOPY WITH RESECTOSCOPE  7/99   resect polyp   MOHS SURGERY  05/2018   face   TUBAL LIGATION Bilateral    Patient Active  Problem List   Diagnosis Date Noted   Falls, sequela 05/16/2022   History of COVID-19 05/16/2022   Ataxia 05/16/2022   Hyperkalemia 06/10/2021   Chronic bronchitis ([place]) 05/30/2021   Lacunar stroke ([place]) 03/08/2021   Prediabetes 01/02/2021   History of CVA (cerebrovascular accident) without residual deficits 01/01/2021   Balance problem 11/29/2020   Acute cystitis with hematuria 01/31/2020   Recurrent UTI 01/31/2020   Low back pain 10/13/2019   CAD (coronary artery disease) 03/04/2018   Lightheadedness 11/05/2017   Canker sore 01/01/2017   Kidney cyst, acquired 07/22/2016   Rash and nonspecific skin eruption 09/12/2015   Obesity 01/03/2015   Upper airway cough syndrome 12/08/2014   Right-sided thoracic back pain 05/12/2014   Palpitations 05/12/2014   Snoring 12/22/2013   Diarrhea 07/19/2013   Dizziness 07/19/2013   Fatigue 10/15/2011   Impaired glucose tolerance 11/22/2010   Well adult exam 11/22/2010   Abdominal pain, lower 11/22/2010   SYNCOPE  09/27/2009   Edema 09/27/2009   Recurrent pneumonia 08/15/2009   ANEMIA, NORMOCYTIC 08/10/2009   SKIN RASH 08/10/2009   FOOT PAIN 07/25/2009   Dyspnea 07/01/2008   Acute bronchitis 06/12/2007   Essential hypertension 03/08/2007   DIVERTICULOSIS, COLON 03/08/2007   ESOPHAGEAL STRICTURE 03/06/2007   IBS 03/06/2007   SHINGLES, HX OF 03/06/2007   Dyslipidemia 02/02/2007   GERD 02/02/2007   Osteoporosis 02/02/2007   DVT, HX OF 02/02/2007    PCP: Plotnikov, [name], MD  REFERRING PROVIDER: [name], MD  REFERRING DIAG:  M54.50,G89.29 (ICD-10-CM) - Chronic right-sided low back pain without sciatica  R26.9 (ICD-10-CM) - Gait disorder    Rationale for Evaluation and Treatment: Rehabilitation  THERAPY DIAG:  Unsteadiness on feet  History of falling  Difficulty in walking, not elsewhere classified  Muscle weakness (generalized)  ONSET DATE: 01/14/2022  SUBJECTIVE:                                                                                                                                                                                           SUBJECTIVE STATEMENT:  The patient has been back to the MD.  She was given some things to do and things not to do for her back.  She reports her back has been doing really well for about 2 weeks.  She has returned to walking in her community.  She has not had any falls for some time. PERTINENT HISTORY:    PAIN:  Are you having pain? No: NPRS scale: 0/10 Location:  Type: pulling feeling  Aggravating factors: turning head  Relieving factors: heat and tylenol, not moving head    PRECAUTIONS: Fall,  very HOH   WEIGHT BEARING RESTRICTIONS: No  FALLS:  Has patient fallen in last 6 months? Yes. Number of falls 1 on the 17th; (+) FOF  (Nov. 17th)  LIVING ENVIRONMENT: Lives with: lives alone but has a daughter that lives 3 minutes away  Lives in: House/apartment Stairs: 5STE with L ascending rail, no steps inside  the home but does have threshold step to get into home  Has following equipment at home: Single point cane  OCCUPATION: retired   PLOF: Independent  PATIENT GOALS: want to walk straight and not keel over   NEXT MD VISIT: referring in 5-6 months   OBJECTIVE:   DIAGNOSTIC FINDINGS:  Per pt had xrays done on the 16th, low back and hips looked good but have some arthritis in the back   PATIENT SURVEYS:  FOTO will do 2nd session  SCREENING FOR RED FLAGS: Bowel or bladder incontinence: Yes: bladder incontinence for 5 years, not associated with back pain  Spinal tumors: No Cauda equina syndrome: No Compression fracture: No Abdominal aneurysm: No  COGNITION: Overall cognitive status: Within functional limits for tasks assessed     SENSATION: WFL Not tested  MUSCLE LENGTH:   POSTURE: rounded shoulders, forward head, decreased lumbar lordosis, and increased thoracic kyphosis  PALPATION:   LUMBAR ROM:   AROM eval  Flexion   Extension   Right lateral flexion   Left lateral flexion   Right rotation   Left rotation    (Blank rows = not tested)  LOWER EXTREMITY ROM:     Active  Right eval Left eval  Hip flexion    Hip extension    Hip abduction    Hip adduction    Hip internal rotation    Hip external rotation    Knee flexion    Knee extension    Ankle dorsiflexion    Ankle plantarflexion    Ankle inversion    Ankle eversion     (Blank rows = not tested)  LOWER EXTREMITY MMT:    MMT Right eval Left eval R 12/29 L 12/29 R 1/16 L 1/16  Hip flexion '3 3 4 '$ 4- 4+ 4+  Hip extension        Hip abduction 3+ 3 3+ 4 4+ 4+  Hip adduction        Hip internal rotation        Hip external rotation        Knee flexion 5 4 4+ 4+    Knee extension '5 4 5 '$ 4+ 5 5  Ankle dorsiflexion 5 5      Ankle plantarflexion        Ankle inversion        Ankle eversion         (Blank rows = not tested)  LUMBAR SPECIAL TESTS:    FUNCTIONAL TESTS:  5 times sit to stand:  14 seconds no UEs  3 minute walk test: 434f no rest RPE 4/10 Dynamic Gait Index: 14/24  1/16 5x sit to stand 12 sec no UE use  3 minute walk test 550 no rest RPE 3/10  DGI: 16/24   GAIT: Distance walked: in clinic distances  Assistive device utilized: None Level of assistance: Complete Independence Comments: flexed at hips, short step lengths, stance times, L/R drift increasing with fatigue, + trendelenburg. Gait speed 2.7 ft/sec   TODAY'S TREATMENT:  DATE:  3/6 -Reviewed final HEP.  Removed some of the next things that were making her neck more tight.  Seated: Bilateral ER 2 x 10 yellow Horizontal abduction 2 x 10 yellow  Seated hip abduction 2 x 15 red Seated march 2 x 15 red Long arc quad 2 x 10 each leg red  Balance: Narrow base of support 2 x 20 seconds Narrow base of support 2 x 20 seconds eyes closed  Tandem stance 2 x 20 seconds each    Reviewed balance met and progression to balance met.  Reviewed safety for balance exercises for home.   06/04/22:   TherEX  Sidestepping  in hall- 1/2 hall x1lap Standing heel/toe raises 2x10, 2.5# Standing marching 2x10-2.5# LAQ 2x10 2.5# 3" hold Standing hip abduction 2.5# 2x10bil   NMR  SLS finger tip support 2x20 seconds ea solid surface Narrow BOS on blue foam pad 2x30 seconds  Romberg stance EC on airex 2x30 seconds FA on airex EC 30secx2 Tandem walking on long airex x3laps CGA Sidestepping on long airex x3 laps CGA Hurdles fwd and lateral-4 hurdles x3laps ea light CGA Cone weaving 2x8 cones, light CGA Gait with head turns/nods in hall- x1 ea. Light CGA Tandem balance 30sec x1 ea on 1/2 roll (flat side down) Marching on airex- 2x10 no UE support.  Toe taps at 8" step no Ues x20          PATIENT EDUCATION:  Education details: exercise form/purpose  Person educated:  Patient Education method: Explanation, Demonstration, and Handouts Education comprehension: verbalized understanding, returned demonstration, and needs further education  HOME EXERCISE PROGRAM:  Access Code: [email or web address] URL: [email or web address] Date: 04/26/2022 Prepared by: Deniece Ree  Exercises - Hip Abduction with Resistance Loop  - 2 x daily - 7 x weekly - 1 sets - 10 reps - 1 hold - Sit to Stand with Resistance Around Legs  - 2 x daily - 7 x weekly - 1 sets - 10 reps - Standing Hip Hiking  - 1 x daily - 7 x weekly - 3 sets - 10 reps - Mini Squat with Counter Support  - 1 x daily - 7 x weekly - 3 sets - 10 reps - Single Leg Stance  - 1 x daily - 7 x weekly - 3 sets - 10 reps - Tandem Stance  - 2 x daily - 7 x weekly - 1 sets - 3 reps - 15-30 hold - Narrow Stance with Counter Support  - 1 x daily - 7 x weekly - 3 sets - 10 reps - Supine Single Knee to Chest Stretch  - 1 x daily - 7 x weekly - 3 sets - 10 reps - Supine Lower Trunk Rotation  - 1 x daily - 7 x weekly - 3 sets - 10 reps - Seated Upper Trapezius Stretch  - 1 x daily - 7 x weekly - 3 sets - 10 reps - Seated Cervical Retraction  - 1 x daily - 7 x weekly - 3 sets - 10 reps - Seated Cervical Retraction and Rotation  - 1 x daily - 7 x weekly - 3 sets - 10 reps  ASSESSMENT:  CLINICAL IMPRESSION: Therapy updated patient's HEP.  We reviewed a seated series of exercises that she can do at home.  She was advised to continue walking.  She has been walking in her neighborhood several days this week.  Her back has not hurt for several weeks.  We reviewed her balance exercises for home.  We talked about increasing the difficulty of her balance exercises.  See below for goal specific progress.  Patient has reached all goals for physical therapy.  Will discharge to HEP at this time.  OBJECTIVE IMPAIRMENTS: Abnormal gait, decreased activity tolerance, decreased balance, decreased coordination, decreased mobility, difficulty  walking, decreased strength, and postural dysfunction.   ACTIVITY LIMITATIONS: standing, stairs, transfers, and locomotion level  PARTICIPATION LIMITATIONS: shopping, community activity, yard work, and church  PERSONAL FACTORS: Fitness, Social background, and Time since onset of injury/illness/exacerbation are also affecting patient's functional outcome.   REHAB POTENTIAL: Good  CLINICAL DECISION MAKING: Stable/uncomplicated  EVALUATION COMPLEXITY: Low   GOALS: Goals reviewed with patient? No  SHORT TERM GOALS: Target date: 04/02/2022    Will be compliant with appropriate progressive HEP  Baseline: Goal status: MET 12/29  2.  Will be able to name 3 ways to prevent falls at home and in the community  Baseline:  Goal status: IN PROGRESS 12/29-  needs ongoing education     LONG TERM GOALS: Target date: 04/23/2022    MMT to improve by 1 grade in all weak groups  Baseline:  Goal status: IN PROGRESS 12/29  2.  Will be able to ambulate at least 686f in 3MWT to show improved mobility and community access  Baseline:  Goal status: IN PROGRESS 12/29  3.  Will be able to ambulate community distances with no L/R drift and no feelings of unsteadiness Baseline:  Goal status: MET 12/29  4.  Will score at least 20 on DGI to show improved functional balance skills  Baseline:  Goal status: not tested   5.  Will be able to navigate at least 5 steps with no rail and step over step pattern with minimal unsteadiness to show improved balance and community access  Baseline:  Goal status: met   6.  Will be compliant with appropriate gym based exercise program to maintain functional gains and prevent recurrence of condition  Baseline:  Goal status: met   PLAN:  PT FREQUENCY: 2x/week  PT DURATION: 6 weeks  PLANNED INTERVENTIONS: Therapeutic exercises, Therapeutic activity, Neuromuscular re-education, Balance training, Gait training, Patient/Family education, Self Care, Joint  mobilization, Stair training, DME instructions, Aquatic Therapy, Spinal mobilization, Cryotherapy, Moist heat, Taping, Ultrasound, Ionotophoresis '4mg'$ /ml Dexamethasone, Manual therapy, and Re-evaluation.  PLAN FOR NEXT SESSION: balance and strength focus, HEP updates PRN. Need to work on things like hurdles that force her to clear obstacles   DCarolyne LittlesPT DPT

## 2022-06-14 ENCOUNTER — Ambulatory Visit
Admission: RE | Admit: 2022-06-14 | Discharge: 2022-06-14 | Disposition: A | Discharge status: Home / Self Care | Payer: PPO | Source: Ambulatory Visit | Attending: Internal Medicine | Admitting: Internal Medicine

## 2022-06-14 ENCOUNTER — Other Ambulatory Visit: Payer: PPO

## 2022-06-14 ENCOUNTER — Ambulatory Visit: Payer: PPO | Admitting: Internal Medicine

## 2022-06-14 DIAGNOSIS — W19XXXS Unspecified fall, sequela: Secondary | ICD-10-CM

## 2022-06-14 DIAGNOSIS — I6381 Other cerebral infarction due to occlusion or stenosis of small artery: Secondary | ICD-10-CM

## 2022-06-14 DIAGNOSIS — R2681 Unsteadiness on feet: Secondary | ICD-10-CM | POA: Diagnosis not present

## 2022-06-14 DIAGNOSIS — R270 Ataxia, unspecified: Secondary | ICD-10-CM

## 2022-06-19 ENCOUNTER — Encounter: Payer: Self-pay | Admitting: Internal Medicine

## 2022-06-19 ENCOUNTER — Ambulatory Visit (INDEPENDENT_AMBULATORY_CARE_PROVIDER_SITE_OTHER): Payer: PPO | Admitting: Internal Medicine

## 2022-06-19 VITALS — BP 122/80 | HR 75 | Temp 97.9°F | Ht 59.0 in | Wt 167.0 lb

## 2022-06-19 DIAGNOSIS — N3281 Overactive bladder: Secondary | ICD-10-CM | POA: Insufficient documentation

## 2022-06-19 DIAGNOSIS — G8929 Other chronic pain: Secondary | ICD-10-CM

## 2022-06-19 DIAGNOSIS — N390 Urinary tract infection, site not specified: Secondary | ICD-10-CM

## 2022-06-19 DIAGNOSIS — M545 Low back pain, unspecified: Secondary | ICD-10-CM

## 2022-06-19 DIAGNOSIS — I6381 Other cerebral infarction due to occlusion or stenosis of small artery: Secondary | ICD-10-CM

## 2022-06-19 DIAGNOSIS — Z8673 Personal history of transient ischemic attack (TIA), and cerebral infarction without residual deficits: Secondary | ICD-10-CM

## 2022-06-19 MED ORDER — NITROGLYCERIN 0.4 MG SL SUBL: 0.4000 mg | SUBLINGUAL_TABLET | SUBLINGUAL | 3 refills | Status: DC | PRN

## 2022-06-19 MED ORDER — FLUTICASONE PROPIONATE 50 MCG/ACT NA SUSP: NASAL | 3 refills | Status: DC

## 2022-06-19 NOTE — Assessment & Plan Note (Signed)
Chronic Options to treat discussed Use more water at home Reduce Lasix use

## 2022-06-19 NOTE — Assessment & Plan Note (Signed)
Macrobid prn

## 2022-06-19 NOTE — Progress Notes (Signed)
Subjective:  Patient ID: Diana Stout, female    DOB: Apr 26, 1930  Age: 89 y.o. MRN: [id number]  CC: Follow-up (Go over scan )   HPI CHANTEL NORR presents for LBP - much better after PT. No falls. Ataxia is better. Pt saw Dr Maxie Better  Outpatient Medications Prior to Visit  Medication Sig Dispense Refill   albuterol (VENTOLIN HFA) 108 (90 Base) MCG/ACT inhaler Inhale 1 puff into the lungs every 6 (six) hours as needed. 8 g 3   Apple Cider Vinegar (APPLE CIDER VINEGAR ULTRA) 600 MG CAPS Take by mouth.     aspirin 81 MG tablet Take 81 mg by mouth daily.     cholecalciferol (VITAMIN D) 1000 UNITS tablet Take 1,000 Units by mouth daily.     cyanocobalamin 1000 MCG tablet Take 1,000 mcg by mouth daily.     furosemide (LASIX) 20 MG tablet TAKE 1 TABLET(20 MG) BY MOUTH DAILY AS NEEDED 90 tablet 1   lidocaine (XYLOCAINE) 2 % solution Take by mouth.     Magnesium 400 MG CAPS Take by mouth daily.     nitrofurantoin, macrocrystal-monohydrate, (MACROBID) 100 MG capsule Take 1 capsule (100 mg total) by mouth 2 (two) times daily. 14 capsule 0   potassium chloride (KLOR-CON M) 10 MEQ tablet TAKE ONCE A DAY WITH FUROSEMIDE AS NEEDED     Probiotic Product (ALIGN) 4 MG CAPS Take 1 capsule (4 mg total) by mouth daily. 30 capsule 0   Respiratory Therapy Supplies (FLUTTER) DEVI Use as directed 1 each 0   fluticasone (FLONASE) 50 MCG/ACT nasal spray USE 2 SPRAYS IN EACH NOSTRIL AS NEEDED FOR RHINITIS 48 g 3   nitroGLYCERIN (NITROSTAT) 0.4 MG SL tablet Place 1 tablet (0.4 mg total) under the tongue every 5 (five) minutes as needed for chest pain. 25 tablet 3   No facility-administered medications prior to visit.    ROS: Review of Systems  Constitutional:  Negative for activity change, appetite change, chills, fatigue and unexpected weight change.  HENT:  Negative for congestion, mouth sores and sinus pressure.   Eyes:  Negative for visual disturbance.  Respiratory:  Negative for cough and chest  tightness.   Gastrointestinal:  Negative for abdominal pain and nausea.  Genitourinary:  Positive for frequency and urgency. Negative for difficulty urinating and vaginal pain.  Musculoskeletal:  Positive for gait problem. Negative for back pain.  Skin:  Negative for pallor and rash.  Neurological:  Negative for dizziness, tremors, weakness, numbness and headaches.  Psychiatric/Behavioral:  Negative for confusion and sleep disturbance.     Objective:  BP 122/80 (BP Location: Right Arm, Patient Position: Sitting, Cuff Size: Normal)   Pulse 75   Temp 97.9 F (36.6 C) (Oral)   Ht '4\' 11"'$  (1.499 m)   Wt 167 lb (75.8 kg)   LMP 04/08/1996   SpO2 96%   BMI 33.73 kg/m   BP Readings from Last 3 Encounters:  06/19/22 122/80  05/29/22 131/81  05/16/22 120/70    Wt Readings from Last 3 Encounters:  06/19/22 167 lb (75.8 kg)  05/29/22 164 lb 9.6 oz (74.7 kg)  05/16/22 163 lb (73.9 kg)    Physical Exam Constitutional:      General: She is not in acute distress.    Appearance: She is well-developed. She is obese.  HENT:     Head: Normocephalic.     Right Ear: External ear normal.     Left Ear: External ear normal.     Nose:  Nose normal.  Eyes:     General:        Right eye: No discharge.        Left eye: No discharge.     Conjunctiva/sclera: Conjunctivae normal.     Pupils: Pupils are equal, round, and reactive to light.  Neck:     Thyroid: No thyromegaly.     Vascular: No JVD.     Trachea: No tracheal deviation.  Cardiovascular:     Rate and Rhythm: Normal rate and regular rhythm.     Heart sounds: Normal heart sounds.  Pulmonary:     Effort: No respiratory distress.     Breath sounds: No stridor. No wheezing.  Abdominal:     General: Bowel sounds are normal. There is no distension.     Palpations: Abdomen is soft. There is no mass.     Tenderness: There is no abdominal tenderness. There is no guarding or rebound.  Musculoskeletal:        General: No tenderness.      Cervical back: Normal range of motion and neck supple. No rigidity.  Lymphadenopathy:     Cervical: No cervical adenopathy.  Skin:    Findings: No erythema or rash.  Neurological:     Mental Status: She is oriented to person, place, and time.     Cranial Nerves: No cranial nerve deficit.     Motor: No abnormal muscle tone.     Coordination: Coordination abnormal.     Deep Tendon Reflexes: Reflexes normal.  Psychiatric:        Behavior: Behavior normal.        Thought Content: Thought content normal.        Judgment: Judgment normal.    LBP - much better after PT. Ataxia is better.    Lab Results  Component Value Date   WBC 5.3 05/17/2022   HGB 12.1 05/17/2022   HCT 37.2 05/17/2022   PLT 270.0 05/17/2022   GLUCOSE 101 (H) 05/17/2022   CHOL 205 (H) 12/19/2021   TRIG 122.0 12/19/2021   HDL 43.00 12/19/2021   LDLDIRECT 154.9 05/01/2011   LDLCALC 137 (H) 12/19/2021   ALT 13 05/17/2022   AST 16 05/17/2022   NA 141 05/17/2022   K 4.0 05/17/2022   CL 104 05/17/2022   CREATININE 0.68 05/17/2022   BUN 18 05/17/2022   CO2 28 05/17/2022   TSH 0.92 05/17/2022   HGBA1C 6.2 12/19/2021    CT HEAD WO CONTRAST (5MM)  Result Date: 06/14/2022 CLINICAL DATA:  89 year old female with unsteady gait. EXAM: CT HEAD WITHOUT CONTRAST TECHNIQUE: Contiguous axial images were obtained from the base of the skull through the vertex without intravenous contrast. RADIATION DOSE REDUCTION: This exam was performed according to the departmental dose-optimization program which includes automated exposure control, adjustment of the mA and/or kV according to patient size and/or use of iterative reconstruction technique. COMPARISON:  12/22/2020 brain MR FINDINGS: Brain: No evidence of acute infarction, hemorrhage, hydrocephalus, extra-axial collection or mass lesion/mass effect. Mild atrophy and mild chronic small-vessel white matter ischemic changes again noted. Vascular: Carotid atherosclerotic calcifications  are noted. Skull: Normal. Negative for fracture or focal lesion. Sinuses/Orbits: No acute finding. Other: None. IMPRESSION: 1. No evidence of acute intracranial abnormality. 2. Mild atrophy and mild chronic small-vessel white matter ischemic changes. Electronically Signed   By: Margarette Canada M.D.   On: 06/14/2022 15:51    Assessment & Plan:   Problem List Items Addressed This Visit  Nervous and Auditory   Lacunar stroke (HCC)    Baby ASA BP control CT reviewed w/pt        Genitourinary   Recurrent UTI    Macrobid prn      Relevant Orders   Urinalysis   OAB (overactive bladder)    Chronic Options to treat discussed Use more water at home Reduce Lasix use        Other   Low back pain - Primary    LBP - much better after PT. No falls. Ataxia is better. Pt saw Dr Maxie Better         Meds ordered this encounter  Medications   fluticasone (FLONASE) 50 MCG/ACT nasal spray    Sig: USE 2 SPRAYS IN EACH NOSTRIL AS NEEDED FOR RHINITIS    Dispense:  48 g    Refill:  3   nitroGLYCERIN (NITROSTAT) 0.4 MG SL tablet    Sig: Place 1 tablet (0.4 mg total) under the tongue every 5 (five) minutes as needed for chest pain.    Dispense:  25 tablet    Refill:  3      Follow-up: Return in about 4 months (around 10/19/2022) for a follow-up visit.  Walker Kehr, MD

## 2022-06-19 NOTE — Assessment & Plan Note (Signed)
Baby ASA BP control CT reviewed w/pt

## 2022-06-19 NOTE — Assessment & Plan Note (Signed)
LBP - much better after PT. No falls. Ataxia is better. Pt saw Dr Maxie Better

## 2022-06-20 ENCOUNTER — Other Ambulatory Visit (HOSPITAL_COMMUNITY): Payer: Self-pay

## 2022-06-24 ENCOUNTER — Other Ambulatory Visit (HOSPITAL_COMMUNITY): Payer: Self-pay

## 2022-08-13 ENCOUNTER — Telehealth: Payer: Self-pay | Admitting: Internal Medicine

## 2022-08-13 NOTE — Telephone Encounter (Signed)
Contacted Diana Stout to schedule their annual wellness visit. Appointment made for 08/28/2022.  [facility] Care Guide [facility] AWV TEAM Direct Dial: [phone]

## 2022-08-28 ENCOUNTER — Ambulatory Visit: Payer: PPO

## 2022-09-04 ENCOUNTER — Ambulatory Visit (INDEPENDENT_AMBULATORY_CARE_PROVIDER_SITE_OTHER): Payer: PPO

## 2022-09-04 VITALS — BP 122/82 | HR 79 | Temp 97.8°F | Ht 59.0 in | Wt 166.8 lb

## 2022-09-04 DIAGNOSIS — Z0000 Encounter for general adult medical examination without abnormal findings: Secondary | ICD-10-CM | POA: Diagnosis not present

## 2022-09-04 NOTE — Patient Instructions (Signed)
[name] , Thank you for taking time to come for your Medicare Wellness Visit. I appreciate your ongoing commitment to your health goals. Please review the following plan we discussed and let me know if I can assist you in the future.   These are the goals we discussed:  Goals      My goal for 2024 is to increase my walking, staying independent  and maintain my health.        This is a list of the screening recommended for you and due dates:  Health Maintenance  Topic Date Due   COVID-19 Vaccine (4 - 2023-24 season) 12/07/2021   Flu Shot  11/07/2022   Medicare Annual Wellness Visit  09/04/2023   DTaP/Tdap/Td vaccine (4 - Td or Tdap) 08/24/2025   Pneumonia Vaccine  Completed   DEXA scan (bone density measurement)  Completed   HPV Vaccine  Aged Out   Zoster (Shingles) Vaccine  Discontinued    Advanced directives: Yes; Please bring a copy of your health care power of attorney and living will to the office at your convenience.  Conditions/risks identified: Yes  Next appointment: Follow up in one year for your annual wellness visit.   Preventive Care 67 Years and Older, Female Preventive care refers to lifestyle choices and visits with your health care provider that can promote health and wellness. What does preventive care include? A yearly physical exam. This is also called an annual well check. Dental exams once or twice a year. Routine eye exams. Ask your health care provider how often you should have your eyes checked. Personal lifestyle choices, including: Daily care of your teeth and gums. Regular physical activity. Eating a healthy diet. Avoiding tobacco and drug use. Limiting alcohol use. Practicing safe sex. Taking low-dose aspirin every day. Taking vitamin and mineral supplements as recommended by your health care provider. What happens during an annual well check? The services and screenings done by your health care provider during your annual well check will  depend on your age, overall health, lifestyle risk factors, and family history of disease. Counseling  Your health care provider may ask you questions about your: Alcohol use. Tobacco use. Drug use. Emotional well-being. Home and relationship well-being. Sexual activity. Eating habits. History of falls. Memory and ability to understand (cognition). Work and work Astronomer. Reproductive health. Screening  You may have the following tests or measurements: Height, weight, and BMI. Blood pressure. Lipid and cholesterol levels. These may be checked every 5 years, or more frequently if you are over 13 years old. Skin check. Lung cancer screening. You may have this screening every year starting at age 41 if you have a 30-pack-year history of smoking and currently smoke or have quit within the past 15 years. Fecal occult blood test (FOBT) of the stool. You may have this test every year starting at age 63. Flexible sigmoidoscopy or colonoscopy. You may have a sigmoidoscopy every 5 years or a colonoscopy every 10 years starting at age 70. Hepatitis C blood test. Hepatitis B blood test. Sexually transmitted disease (STD) testing. Diabetes screening. This is done by checking your blood sugar (glucose) after you have not eaten for a while (fasting). You may have this done every 1-3 years. Bone density scan. This is done to screen for osteoporosis. You may have this done starting at age 76. Mammogram. This may be done every 1-2 years. Talk to your health care provider about how often you should have regular mammograms. Talk with your health  care provider about your test results, treatment options, and if necessary, the need for more tests. Vaccines  Your health care provider may recommend certain vaccines, such as: Influenza vaccine. This is recommended every year. Tetanus, diphtheria, and acellular pertussis (Tdap, Td) vaccine. You may need a Td booster every 10 years. Zoster vaccine. You may  need this after age 64. Pneumococcal 13-valent conjugate (PCV13) vaccine. One dose is recommended after age 46. Pneumococcal polysaccharide (PPSV23) vaccine. One dose is recommended after age 51. Talk to your health care provider about which screenings and vaccines you need and how often you need them. This information is not intended to replace advice given to you by your health care provider. Make sure you discuss any questions you have with your health care provider. Document Released: 04/21/2015 Document Revised: 12/13/2015 Document Reviewed: 01/24/2015 Elsevier Interactive Patient Education  2017 [employer].  Fall Prevention in the Home Falls can cause injuries. They can happen to people of all ages. There are many things you can do to make your home safe and to help prevent falls. What can I do on the outside of my home? Regularly fix the edges of walkways and driveways and fix any cracks. Remove anything that might make you trip as you walk through a door, such as a raised step or threshold. Trim any bushes or trees on the path to your home. Use bright outdoor lighting. Clear any walking paths of anything that might make someone trip, such as rocks or tools. Regularly check to see if handrails are loose or broken. Make sure that both sides of any steps have handrails. Any raised decks and porches should have guardrails on the edges. Have any leaves, snow, or ice cleared regularly. Use sand or salt on walking paths during winter. Clean up any spills in your garage right away. This includes oil or grease spills. What can I do in the bathroom? Use night lights. Install grab bars by the toilet and in the tub and shower. Do not use towel bars as grab bars. Use non-skid mats or decals in the tub or shower. If you need to sit down in the shower, use a plastic, non-slip stool. Keep the floor dry. Clean up any water that spills on the floor as soon as it happens. Remove soap buildup in  the tub or shower regularly. Attach bath mats securely with double-sided non-slip rug tape. Do not have throw rugs and other things on the floor that can make you trip. What can I do in the bedroom? Use night lights. Make sure that you have a light by your bed that is easy to reach. Do not use any sheets or blankets that are too big for your bed. They should not hang down onto the floor. Have a firm chair that has side arms. You can use this for support while you get dressed. Do not have throw rugs and other things on the floor that can make you trip. What can I do in the kitchen? Clean up any spills right away. Avoid walking on wet floors. Keep items that you use a lot in easy-to-reach places. If you need to reach something above you, use a strong step stool that has a grab bar. Keep electrical cords out of the way. Do not use floor polish or wax that makes floors slippery. If you must use wax, use non-skid floor wax. Do not have throw rugs and other things on the floor that can make you trip. What can  I do with my stairs? Do not leave any items on the stairs. Make sure that there are handrails on both sides of the stairs and use them. Fix handrails that are broken or loose. Make sure that handrails are as long as the stairways. Check any carpeting to make sure that it is firmly attached to the stairs. Fix any carpet that is loose or worn. Avoid having throw rugs at the top or bottom of the stairs. If you do have throw rugs, attach them to the floor with carpet tape. Make sure that you have a light switch at the top of the stairs and the bottom of the stairs. If you do not have them, ask someone to add them for you. What else can I do to help prevent falls? Wear shoes that: Do not have high heels. Have rubber bottoms. Are comfortable and fit you well. Are closed at the toe. Do not wear sandals. If you use a stepladder: Make sure that it is fully opened. Do not climb a closed  stepladder. Make sure that both sides of the stepladder are locked into place. Ask someone to hold it for you, if possible. Clearly mark and make sure that you can see: Any grab bars or handrails. First and last steps. Where the edge of each step is. Use tools that help you move around (mobility aids) if they are needed. These include: Canes. Walkers. Scooters. Crutches. Turn on the lights when you go into a dark area. Replace any light bulbs as soon as they burn out. Set up your furniture so you have a clear path. Avoid moving your furniture around. If any of your floors are uneven, fix them. If there are any pets around you, be aware of where they are. Review your medicines with your doctor. Some medicines can make you feel dizzy. This can increase your chance of falling. Ask your doctor what other things that you can do to help prevent falls. This information is not intended to replace advice given to you by your health care provider. Make sure you discuss any questions you have with your health care provider. Document Released: 01/19/2009 Document Revised: 08/31/2015 Document Reviewed: 04/29/2014 Elsevier Interactive Patient Education  2017 [employer].

## 2022-09-04 NOTE — Progress Notes (Signed)
Subjective:   Diana Stout is a 89 y.o. female who presents for an Initial Medicare Annual Wellness Visit.  Review of Systems     Cardiac Risk Factors include: advanced age (>30men, >37 women);dyslipidemia;hypertension;obesity (BMI >30kg/m2)     Objective:    Today's Vitals   09/04/22 1119  BP: 122/82  Pulse: 79  Temp: 97.8 F (36.6 C)  TempSrc: Temporal  SpO2: 90%  Weight: 166 lb 12.8 oz (75.7 kg)  Height: 4\' 11"  (1.499 m)  PainSc: 0-No pain   Body mass index is 33.69 kg/m.     09/04/2022   11:58 AM 03/12/2022   11:05 AM 09/02/2015    3:53 PM 07/22/2015    9:49 PM  Advanced Directives  Does Patient Have a Medical Advance Directive? Yes No;Yes No;Yes Yes  Type of Estate agent of [place];Living will Healthcare Power of [employer];Living will Living will Healthcare Power of [place];Living will  Does patient want to make changes to medical advance directive?  No - Patient declined    Copy of Healthcare Power of Attorney in Chart? No - copy requested No - copy requested No - copy requested   Would patient like information on creating a medical advance directive?  No - Patient declined      Current Medications (verified) Outpatient Encounter Medications as of 09/04/2022  Medication Sig   albuterol (VENTOLIN HFA) 108 (90 Base) MCG/ACT inhaler Inhale 1 puff into the lungs every 6 (six) hours as needed.   Apple Cider Vinegar (APPLE CIDER VINEGAR ULTRA) 600 MG CAPS Take by mouth.   aspirin 81 MG tablet Take 81 mg by mouth daily.   cholecalciferol (VITAMIN D) 1000 UNITS tablet Take 1,000 Units by mouth daily.   cyanocobalamin 1000 MCG tablet Take 1,000 mcg by mouth daily.   fluticasone (FLONASE) 50 MCG/ACT nasal spray USE 2 SPRAYS IN EACH NOSTRIL AS NEEDED FOR RHINITIS   furosemide (LASIX) 20 MG tablet TAKE 1 TABLET(20 MG) BY MOUTH DAILY AS NEEDED   lidocaine (XYLOCAINE) 2 % solution Take by mouth.   Magnesium 400 MG CAPS Take by mouth daily.    nitrofurantoin, macrocrystal-monohydrate, (MACROBID) 100 MG capsule Take 1 capsule (100 mg total) by mouth 2 (two) times daily.   nitroGLYCERIN (NITROSTAT) 0.4 MG SL tablet Place 1 tablet (0.4 mg total) under the tongue every 5 (five) minutes as needed for chest pain.   potassium chloride (KLOR-CON M) 10 MEQ tablet TAKE ONCE A DAY WITH FUROSEMIDE AS NEEDED   Probiotic Product (ALIGN) 4 MG CAPS Take 1 capsule (4 mg total) by mouth daily.   Respiratory Therapy Supplies (FLUTTER) DEVI Use as directed   No facility-administered encounter medications on file as of 09/04/2022.    Allergies (verified) Atorvastatin, Alendronate sodium, Ceftriaxone sodium, Codeine phosphate, Moxifloxacin, and Simvastatin   History: Past Medical History:  Diagnosis Date   Abdominal pain, lower 11/22/2010   2015 resolved off Zocor 5/17?urticaria w/GI upset: Dtr is allergic to seafood - the pt is on MegaRed krill oil - d/c MegaRed    Abnormal chest CT 05/22/2011   Followed in Pulmonary clinic/ [facility]/ [place]  05/14/11 CT CHEST WITHOUT CONTRAST 1. Multiple small pulmonary nodules scattered throughout the lungs  bilaterally ranging in size from 3-6 mm.    2. There is a small hiatal hernia.  3. Atherosclerosis.  4. Status post cholecystectomy.  5. Low attenuation hepatic lesions unchanged compared to recent  abdominal CT scan, likely to represent cysts   ANEMIA, NORMOCYTIC 08/10/2009  mild     DIVERTICULOSIS, COLON 03/08/2007   Qualifier: Diagnosis of  By: Jonny Ruiz MD, Len Blalock    Dizziness 07/19/2013   Likely due to diarrhea/meds 4/15    DVT (deep venous thrombosis) (HCC) 1998   hx of right   Dyslipidemia 02/02/2007   Chronic 1/13 worse     Dyspnea 07/01/2008   2/13 improved w/reg exercise 1/17 deconditioning - CL stress test was nl     Edema 09/27/2009   Qualifier: Diagnosis of  By: Eden Emms, MD, Harrington Challenger    ESOPHAGEAL STRICTURE 03/06/2007   Qualifier: Diagnosis of  By: Jonny Ruiz MD, Len Blalock    Esophagitis 2006    Essential hypertension 03/08/2007   Chronic  On Benicar    FOOT PAIN 07/25/2009   R foot OA     GERD 02/02/2007   Wedge pillow Protonix prn, Pepcid - d/c   History of pneumonia 12/2016   History of shingles    Hyperlipidemia    IBS 03/06/2007   Qualifier: Diagnosis of  By: Jonny Ruiz MD, Len Blalock    Impaired glucose tolerance 11/22/2010   Kidney cysts    left- Dr Aldean Ast   Lung nodule 05/08/2011   04/15/11 RLL 4 mm nodule on abd CT - [facility] office    Obesity 01/03/2015   Osteoporosis 02/02/2007   Chronic  Vit D, yoga   Palpitations 05/12/2014   Dr Eden Emms 2/16 poss due to steroid pack    Rash and nonspecific skin eruption 09/12/2015   5/17?urticaria Pt is allergic to seafood - the pt is on MegaRed krill oil - d/c MegaRed    Upper airway cough syndrome 12/08/2014   Dr Sherene Sires Flutter valve added 01/02/2015 > resolved 02/03/2015  - rec try off gerd rx p 03/09/15    Past Surgical History:  Procedure Laterality Date   APPENDECTOMY     CHOLECYSTECTOMY     HYSTEROSCOPY WITH RESECTOSCOPE  7/99   resect polyp   MOHS SURGERY  05/2018   face   TUBAL LIGATION Bilateral    Family History  Problem Relation Age of Onset   Asthma Mother    Diabetes Father    Cancer Father        liver   Diabetes Sister    Asthma Sister    Breast cancer Sister    Asthma Sister    Diabetes Brother    Asthma Brother    Diabetes Brother    Thyroid disease Maternal Uncle    Coronary artery disease Other        1 st degree female relative   Social History   Socioeconomic History   Marital status: Widowed    Spouse name: Not on file   Number of children: 3   Years of education: Not on file   Highest education level: Not on file  Occupational History   Occupation: Retired    Associate Professor: RETIRED  Tobacco Use   Smoking status: Never   Smokeless tobacco: Never  Vaping Use   Vaping Use: Never used  Substance and Sexual Activity   Alcohol use: No   Drug use: No   Sexual activity: Not Currently    Birth  control/protection: Post-menopausal  Other Topics Concern   Not on file  Social History Narrative   Not on file   Social Determinants of Health   Financial Resource Strain: Low Risk  (09/04/2022)   Overall Financial Resource Strain (CARDIA)    Difficulty of Paying Living Expenses: Not hard at all  Food Insecurity: No Food Insecurity (09/04/2022)   Hunger Vital Sign    Worried About Running Out of Food in the Last Year: Never true    Ran Out of Food in the Last Year: Never true  Transportation Needs: No Transportation Needs (09/04/2022)   PRAPARE - Administrator, Civil Service (Medical): No    Lack of Transportation (Non-Medical): No  Physical Activity: Sufficiently Active (09/04/2022)   Exercise Vital Sign    Days of Exercise per Week: 5 days    Minutes of Exercise per Session: 30 min  Stress: No Stress Concern Present (09/04/2022)   [employer] of Occupational Health - Occupational Stress Questionnaire    Feeling of Stress : Not at all  Social Connections: Moderately Integrated (09/04/2022)   Social Connection and Isolation Panel [NHANES]    Frequency of Communication with Friends and Family: More than three times a week    Frequency of Social Gatherings with Friends and Family: More than three times a week    Attends Religious Services: More than 4 times per year    Active Member of [employer] or Organizations: Yes    Attends Banker Meetings: More than 4 times per year    Marital Status: Widowed    Tobacco Counseling Counseling given: Not Answered   Clinical Intake:  Pre-visit preparation completed: Yes  Pain : No/denies pain Pain Score: 0-No pain     BMI - recorded: 33.69 Nutritional Status: BMI > 30  Obese Nutritional Risks: None Diabetes: No  How often do you need to have someone help you when you read instructions, pamphlets, or other written materials from your doctor or pharmacy?: 1 - Never What is the last grade level you completed  in school?: HSG  Diabetic? No  Interpreter Needed?: No  Information entered by :: Llana Deshazo N. Aryah Doering, LPN.   Activities of Daily Living    09/04/2022   11:59 AM  In your present state of health, do you have any difficulty performing the following activities:  Hearing? 1  Vision? 0  Difficulty concentrating or making decisions? 0  Walking or climbing stairs? 1  Comment uses a walking stick  Dressing or bathing? 0  Doing errands, shopping? 0  Preparing Food and eating ? N  Using the Toilet? N  In the past six months, have you accidently leaked urine? Y  Comment goes to [facility]  Do you have problems with loss of bowel control? N  Managing your Medications? N  Managing your Finances? N  Housekeeping or managing your Housekeeping? N    Patient Care Team: Plotnikov, Georgina Quint, MD as PCP - General Wendall Stade, MD as PCP - Cardiology (Cardiology) Lanier Prude, MD as PCP - Electrophysiology (Cardiology) Gladys Damme (Gynecology) Pati Gallo, MD (Orthopedic Surgery) Venancio Poisson, MD (Dermatology) Nyoka Cowden, MD as Consulting Physician (Pulmonary Disease) Nyoka Cowden, MD as Consulting Physician (Pulmonary Disease) Frederico Hamman, MD as Consulting Physician (Orthopedic Surgery) [facility] as Consulting Physician (Ophthalmology)  Indicate any recent Medical Services you may have received from other than [facility] providers in the past year (date may be approximate).     Assessment:   This is a routine wellness examination for [place].  Hearing/Vision screen Hearing Screening - Comments:: Patient wears hearing aids. Vision Screening - Comments:: Wears otc readers - up to date with routine eye exams with [facility]   Dietary issues and exercise activities discussed: Current Exercise Habits: Home exercise  routine, Type of exercise: walking, Time (Minutes): 30, Frequency (Times/Week): 5, Weekly Exercise  (Minutes/Week): 150, Intensity: Moderate, Exercise limited by: respiratory conditions(s)   Goals Addressed             This Visit's Progress    My goal for 2024 is to increase my walking, staying independent  and maintain my health.        Depression Screen    09/04/2022   11:51 AM 06/19/2022   10:01 AM 05/29/2022   10:13 AM 05/16/2022    3:41 PM 10/17/2021    2:43 PM 06/06/2021   10:02 AM 01/31/2020    2:58 PM  PHQ 2/9 Scores  PHQ - 2 Score 0 0 0 0 0 0 0  PHQ- 9 Score 0          Fall Risk    09/04/2022   11:59 AM 06/19/2022   10:01 AM 05/29/2022   10:13 AM 05/16/2022    3:41 PM 10/17/2021    2:42 PM  Fall Risk   Falls in the past year? 0 1 0 1 0  Number falls in past yr: 0 1 0 1 0  Injury with Fall? 0 0 0 1 0  Risk for fall due to : No Fall Risks History of fall(s) Impaired balance/gait;No Fall Risks No Fall Risks   Follow up Falls prevention discussed Falls evaluation completed  Falls evaluation completed     FALL RISK PREVENTION PERTAINING TO THE HOME:  Any stairs in or around the home? Yes  5 steps in the garage If so, are there any without handrails? No  Home free of loose throw rugs in walkways, pet beds, electrical cords, etc? Yes  Adequate lighting in your home to reduce risk of falls? Yes   ASSISTIVE DEVICES UTILIZED TO PREVENT FALLS:  Life alert? Yes  Use of a cane, walker or w/c? Yes  walking sticks Grab bars in the bathroom? Yes  Shower chair or bench in shower? Yes  Elevated toilet seat or a handicapped toilet? Yes   TIMED UP AND GO:  Was the test performed? Yes .  Length of time to ambulate 10 feet: 10 sec.   Gait slow and steady without use of assistive device  Cognitive Function:        09/04/2022   12:00 PM  6CIT Screen  What Year? 0 points  What month? 0 points  What time? 0 points  Count back from 20 0 points  Months in reverse 0 points  Repeat phrase 0 points  Total Score 0 points    Immunizations Immunization History   Administered Date(s) Administered   Fluad Quad(high Dose 65+) 01/08/2019, 01/07/2020, 12/19/2021   Influenza Split 12/25/2011   Influenza Whole 02/17/2007, 01/17/2010, 01/07/2011, 01/07/2016   Influenza, High Dose Seasonal PF 01/10/2017, 12/25/2017, 01/15/2021   Influenza,inj,Quad PF,6+ Mos 02/03/2015   Influenza-Unspecified 02/04/2013, 01/06/2014, 03/23/2014   Moderna SARS-COV2 Booster Vaccination 12/08/2019   Moderna Sars-Covid-2 Vaccination 04/19/2019, 05/18/2019   Pneumococcal Conjugate-13 03/09/2013   Pneumococcal Polysaccharide-23 02/18/2005, 07/22/2016   Td 06/20/2003, 12/22/2013   Tdap 08/25/2015   Zoster, Live 02/02/2007    TDAP status: Up to date  Flu Vaccine status: Up to date  Pneumococcal vaccine status: Up to date  Covid-19 vaccine status: Completed vaccines  Qualifies for Shingles Vaccine? Yes   Zostavax completed Yes   Shingrix Completed?: Yes  Screening Tests Health Maintenance  Topic Date Due   COVID-19 Vaccine (4 - 2023-24 season) 12/07/2021   INFLUENZA VACCINE  11/07/2022   Medicare Annual Wellness (AWV)  09/04/2023   DTaP/Tdap/Td (4 - Td or Tdap) 08/24/2025   Pneumonia Vaccine 71+ Years old  Completed   DEXA SCAN  Completed   HPV VACCINES  Aged Out   Zoster Vaccines- Shingrix  Discontinued    Health Maintenance  Health Maintenance Due  Topic Date Due   COVID-19 Vaccine (4 - 2023-24 season) 12/07/2021    Colorectal cancer screening: No longer required.   Mammogram status: Completed 01/31/2022. Repeat every year  Bone Density status: Completed 11/17/2014. Results reflect: Bone density results: OSTEOPOROSIS. Repeat every 2 years. Done by Dr. Hyacinth Meeker (OB/GYN)  Lung Cancer Screening: (Low Dose CT Chest recommended if Age 47-80 years, 30 pack-year currently smoking OR have quit w/in 15years.) does not qualify.   Lung Cancer Screening Referral: no  Additional Screening:  Hepatitis C Screening: does not qualify; Completed No  Vision  Screening: Recommended annual ophthalmology exams for early detection of glaucoma and other disorders of the eye. Is the patient up to date with their annual eye exam?  Yes  Who is the provider or what is the name of the office in which the patient attends annual eye exams? [facility] If pt is not established with a provider, would they like to be referred to a provider to establish care? No .   Dental Screening: Recommended annual dental exams for proper oral hygiene  Community Resource Referral / Chronic Care Management: CRR required this visit?  No   CCM required this visit?  No      Plan:     I have personally reviewed and noted the following in the patient's chart:   Medical and social history Use of alcohol, tobacco or illicit drugs  Current medications and supplements including opioid prescriptions. Patient is not currently taking opioid prescriptions. Functional ability and status Nutritional status Physical activity Advanced directives List of other physicians Hospitalizations, surgeries, and ER visits in previous 12 months Vitals Screenings to include cognitive, depression, and falls Referrals and appointments  In addition, I have reviewed and discussed with patient certain preventive protocols, quality metrics, and best practice recommendations. A written personalized care plan for preventive services as well as general preventive health recommendations were provided to patient.     Mickeal Needy, LPN   1/61/0960   Nurse Notes:  Normal cognitive status assessed by direct observation by this Nurse Health Advisor. No abnormalities found.

## 2022-10-16 ENCOUNTER — Ambulatory Visit (INDEPENDENT_AMBULATORY_CARE_PROVIDER_SITE_OTHER): Payer: PPO | Admitting: Internal Medicine

## 2022-10-16 ENCOUNTER — Encounter: Payer: Self-pay | Admitting: Internal Medicine

## 2022-10-16 VITALS — BP 110/70 | HR 90 | Temp 98.6°F | Ht 59.0 in | Wt 166.0 lb

## 2022-10-16 DIAGNOSIS — I10 Essential (primary) hypertension: Secondary | ICD-10-CM

## 2022-10-16 DIAGNOSIS — E785 Hyperlipidemia, unspecified: Secondary | ICD-10-CM | POA: Diagnosis not present

## 2022-10-16 DIAGNOSIS — R609 Edema, unspecified: Secondary | ICD-10-CM

## 2022-10-16 LAB — BASIC METABOLIC PANEL
BUN: 20 mg/dL (ref 6–23)
CO2: 30 mEq/L (ref 19–32)
Calcium: 9.4 mg/dL (ref 8.4–10.5)
Chloride: 104 mEq/L (ref 96–112)
Creatinine, Ser: 0.78 mg/dL (ref 0.40–1.20)
GFR: 67.24 mL/min (ref >60.00)
Glucose, Bld: 107 mg/dL — ABNORMAL HIGH (ref 70–99)
Potassium: 4.1 mEq/L (ref 3.5–5.1)
Sodium: 141 mEq/L (ref 135–145)

## 2022-10-16 MED ORDER — POTASSIUM CHLORIDE CRYS ER 10 MEQ PO TBCR: EXTENDED_RELEASE_TABLET | ORAL | 3 refills | Status: DC

## 2022-10-16 MED ORDER — FUROSEMIDE 20 MG PO TABS: ORAL_TABLET | ORAL | 1 refills | Status: DC

## 2022-10-16 NOTE — Assessment & Plan Note (Signed)
Compression socks or sleeves ?Cont on Lasix, Kdur prn ?

## 2022-10-16 NOTE — Assessment & Plan Note (Signed)
On NAS diet Furosemide 2/wk, Kdur

## 2022-10-16 NOTE — Progress Notes (Signed)
Subjective:  Patient ID: Diana Stout, female    DOB: 1930/06/25  Age: 89 y.o. MRN: [id number]  CC: Follow-up (4 mnth f/u)   HPI Diana Stout presents for HTN, edema, h/o low K  Outpatient Medications Prior to Visit  Medication Sig Dispense Refill   albuterol (VENTOLIN HFA) 108 (90 Base) MCG/ACT inhaler Inhale 1 puff into the lungs every 6 (six) hours as needed. 8 g 3   Apple Cider Vinegar (APPLE CIDER VINEGAR ULTRA) 600 MG CAPS Take by mouth.     aspirin 81 MG tablet Take 81 mg by mouth daily.     cholecalciferol (VITAMIN D) 1000 UNITS tablet Take 1,000 Units by mouth daily.     cyanocobalamin 1000 MCG tablet Take 1,000 mcg by mouth daily.     fluticasone (FLONASE) 50 MCG/ACT nasal spray USE 2 SPRAYS IN EACH NOSTRIL AS NEEDED FOR RHINITIS 48 g 3   lidocaine (XYLOCAINE) 2 % solution Take by mouth.     Magnesium 400 MG CAPS Take by mouth daily.     nitrofurantoin, macrocrystal-monohydrate, (MACROBID) 100 MG capsule Take 1 capsule (100 mg total) by mouth 2 (two) times daily. 14 capsule 0   nitroGLYCERIN (NITROSTAT) 0.4 MG SL tablet Place 1 tablet (0.4 mg total) under the tongue every 5 (five) minutes as needed for chest pain. 25 tablet 3   Probiotic Product (ALIGN) 4 MG CAPS Take 1 capsule (4 mg total) by mouth daily. 30 capsule 0   Respiratory Therapy Supplies (FLUTTER) DEVI Use as directed 1 each 0   furosemide (LASIX) 20 MG tablet TAKE 1 TABLET(20 MG) BY MOUTH DAILY AS NEEDED 90 tablet 1   potassium chloride (KLOR-CON M) 10 MEQ tablet TAKE ONCE A DAY WITH FUROSEMIDE AS NEEDED     No facility-administered medications prior to visit.    ROS: Review of Systems  Constitutional:  Negative for activity change, appetite change, chills, fatigue and unexpected weight change.  HENT:  Negative for congestion, mouth sores and sinus pressure.   Eyes:  Negative for visual disturbance.  Respiratory:  Negative for cough and chest tightness.   Gastrointestinal:  Negative for abdominal  pain and nausea.  Genitourinary:  Negative for difficulty urinating, frequency and vaginal pain.  Musculoskeletal:  Negative for back pain and gait problem.  Skin:  Negative for pallor and rash.  Neurological:  Negative for dizziness, tremors, weakness, numbness and headaches.  Psychiatric/Behavioral:  Negative for confusion and sleep disturbance.     Objective:  BP 110/70 (BP Location: Left Arm, Patient Position: Sitting, Cuff Size: Large)   Pulse 90   Temp 98.6 F (37 C) (Oral)   Ht 4\' 11"  (1.499 m)   Wt 166 lb (75.3 kg)   LMP 04/08/1996   SpO2 94%   BMI 33.53 kg/m   BP Readings from Last 3 Encounters:  10/16/22 110/70  09/04/22 122/82  06/19/22 122/80    Wt Readings from Last 3 Encounters:  10/16/22 166 lb (75.3 kg)  09/04/22 166 lb 12.8 oz (75.7 kg)  06/19/22 167 lb (75.8 kg)    Physical Exam Constitutional:      General: She is not in acute distress.    Appearance: She is well-developed.  HENT:     Head: Normocephalic.     Right Ear: External ear normal.     Left Ear: External ear normal.     Nose: Nose normal.  Eyes:     General:        Right eye: No  discharge.        Left eye: No discharge.     Conjunctiva/sclera: Conjunctivae normal.     Pupils: Pupils are equal, round, and reactive to light.  Neck:     Thyroid: No thyromegaly.     Vascular: No JVD.     Trachea: No tracheal deviation.  Cardiovascular:     Rate and Rhythm: Normal rate and regular rhythm.     Heart sounds: Normal heart sounds.  Pulmonary:     Effort: No respiratory distress.     Breath sounds: No stridor. No wheezing.  Abdominal:     General: Bowel sounds are normal. There is no distension.     Palpations: Abdomen is soft. There is no mass.     Tenderness: There is no abdominal tenderness. There is no guarding or rebound.  Musculoskeletal:        General: No tenderness.     Cervical back: Normal range of motion and neck supple. No rigidity.  Lymphadenopathy:     Cervical: No  cervical adenopathy.  Skin:    Findings: No erythema or rash.  Neurological:     Mental Status: She is oriented to person, place, and time.     Cranial Nerves: No cranial nerve deficit.     Motor: No abnormal muscle tone.     Coordination: Coordination abnormal.     Gait: Gait normal.     Deep Tendon Reflexes: Reflexes normal.  Psychiatric:        Behavior: Behavior normal.        Thought Content: Thought content normal.        Judgment: Judgment normal.   Trace B edema Slight ataxia  Lab Results  Component Value Date   WBC 5.3 05/17/2022   HGB 12.1 05/17/2022   HCT 37.2 05/17/2022   PLT 270.0 05/17/2022   GLUCOSE 101 (H) 05/17/2022   CHOL 205 (H) 12/19/2021   TRIG 122.0 12/19/2021   HDL 43.00 12/19/2021   LDLDIRECT 154.9 05/01/2011   LDLCALC 137 (H) 12/19/2021   ALT 13 05/17/2022   AST 16 05/17/2022   NA 141 05/17/2022   K 4.0 05/17/2022   CL 104 05/17/2022   CREATININE 0.68 05/17/2022   BUN 18 05/17/2022   CO2 28 05/17/2022   TSH 0.92 05/17/2022   HGBA1C 6.2 12/19/2021    CT HEAD WO CONTRAST ( )  Result Date: 06/14/2022 CLINICAL DATA:  89 year old female with unsteady gait. EXAM: CT HEAD WITHOUT CONTRAST TECHNIQUE: Contiguous axial images were obtained from the base of the skull through the vertex without intravenous contrast. RADIATION DOSE REDUCTION: This exam was performed according to the departmental dose-optimization program which includes automated exposure control, adjustment of the mA and/or kV according to patient size and/or use of iterative reconstruction technique. COMPARISON:  12/22/2020 brain MR FINDINGS: Brain: No evidence of acute infarction, hemorrhage, hydrocephalus, extra-axial collection or mass lesion/mass effect. Mild atrophy and mild chronic small-vessel white matter ischemic changes again noted. Vascular: Carotid atherosclerotic calcifications are noted. Skull: Normal. Negative for fracture or focal lesion. Sinuses/Orbits: No acute finding.  Other: None. IMPRESSION: 1. No evidence of acute intracranial abnormality. 2. Mild atrophy and mild chronic small-vessel white matter ischemic changes. Electronically Signed   By: Harmon Pier M.D.   On: 06/14/2022 15:51    Assessment & Plan:   Problem List Items Addressed This Visit     Dyslipidemia    Statin intolerant On Crestor      Essential hypertension - Primary  On NAS diet Furosemide 2/wk, Kdur      Relevant Medications   furosemide (LASIX) 20 MG tablet   Other Relevant Orders   Basic metabolic panel   Edema     Compression socks or sleeves Cont on Lasix, Kdur prn         Meds ordered this encounter  Medications   furosemide (LASIX) 20 MG tablet    Sig: TAKE 1 TABLET(20 MG) BY MOUTH DAILY AS NEEDED    Dispense:  90 tablet    Refill:  1   potassium chloride (KLOR-CON M) 10 MEQ tablet    Sig: TAKE ONCE A DAY WITH FUROSEMIDE AS NEEDED    Dispense:  90 tablet    Refill:  3      Follow-up: Return in about 4 months (around 02/16/2023) for a follow-up visit.  Sonda Primes, MD

## 2022-10-16 NOTE — Assessment & Plan Note (Signed)
Statin intolerant On Crestor

## 2022-11-07 DIAGNOSIS — H6123 Impacted cerumen, bilateral: Secondary | ICD-10-CM | POA: Diagnosis not present

## 2022-12-12 DIAGNOSIS — D1801 Hemangioma of skin and subcutaneous tissue: Secondary | ICD-10-CM | POA: Diagnosis not present

## 2022-12-12 DIAGNOSIS — L853 Xerosis cutis: Secondary | ICD-10-CM | POA: Diagnosis not present

## 2022-12-12 DIAGNOSIS — L821 Other seborrheic keratosis: Secondary | ICD-10-CM | POA: Diagnosis not present

## 2022-12-12 DIAGNOSIS — Z85828 Personal history of other malignant neoplasm of skin: Secondary | ICD-10-CM | POA: Diagnosis not present

## 2022-12-12 DIAGNOSIS — L570 Actinic keratosis: Secondary | ICD-10-CM | POA: Diagnosis not present

## 2022-12-12 DIAGNOSIS — L720 Epidermal cyst: Secondary | ICD-10-CM | POA: Diagnosis not present

## 2022-12-12 DIAGNOSIS — D692 Other nonthrombocytopenic purpura: Secondary | ICD-10-CM | POA: Diagnosis not present

## 2023-02-18 NOTE — Progress Notes (Signed)
CARDIOLOGY CONSULT NOTE      Patient ID: Diana Stout MRN: [id number] DOB/AGE: Jan 12, 1931 89 y.o.  Primary Physician: Tresa Garter, MD Primary Cardiologist: Eden Emms   HPI:  89 y.o. referred by Dr Paulette Blanch 01/06/19  for chest pain and palpitations. 9/20 had pounding heart with chest tightness for 2.5 hours. BP was elevated some dizziness. Resolved and felt fine. Had been off BP meds for a year Toprol restarted on 12/29/18 by primary ECG reviewed from 9/22 normal sinus normal ST/T waves rate 86 bpm  Previous w/u with normal myovue March 2017 EF 68% Has had normal echo's in 2010 and 2016 Some bronchiectasis  Intolerant to lipitor and Zocor in past  She has not had recurrence of palpitations or chest pain She has gotten out of shape during COVID Some exertional dyspnea   Monitor reviewed from 01/28/19  Average HR 80 PAC;s short bursts <8 beats atrial tachycardia  Daughter who had ENT cancer doing well and has masters in deaf education   12/22/20 MRI with lacunar/pontine stroke ? Related to balance issues Seen by Dr Lalla Brothers 02/13/21 and no ILR recommended 14 day Zio with no PAF TTE only mild LAE normal EF and no significant valve disease Carotids 01/03/21 with no significant dx  No cardiac issues Chronic mild dependant edema she uses lasix for Still with poor balance She may start going to [employer]. Suggested she get primary to refer her to PT/OT  Has been intolerant to statins and currently not on Rx for HLD    ROS All other systems reviewed and negative except as noted above  Past Medical History:  Diagnosis Date   Abdominal pain, lower 11/22/2010   2015 resolved off Zocor 5/17?urticaria w/GI upset: Dtr is allergic to seafood - the pt is on MegaRed krill oil - d/c MegaRed    Abnormal chest CT 05/22/2011   Followed in Pulmonary clinic/ [facility]/ [place]  05/14/11 CT CHEST WITHOUT CONTRAST 1. Multiple small pulmonary nodules scattered throughout the lungs  bilaterally  ranging in size from 3-6 mm.    2. There is a small hiatal hernia.  3. Atherosclerosis.  4. Status post cholecystectomy.  5. Low attenuation hepatic lesions unchanged compared to recent  abdominal CT scan, likely to represent cysts   ANEMIA, NORMOCYTIC 08/10/2009   mild     DIVERTICULOSIS, COLON 03/08/2007   Qualifier: Diagnosis of  By: Jonny Ruiz MD, Len Blalock    Dizziness 07/19/2013   Likely due to diarrhea/meds 4/15    DVT (deep venous thrombosis) (HCC) 1998   hx of right   Dyslipidemia 02/02/2007   Chronic 1/13 worse     Dyspnea 07/01/2008   2/13 improved w/reg exercise 1/17 deconditioning - CL stress test was nl     Edema 09/27/2009   Qualifier: Diagnosis of  By: Eden Emms, MD, Harrington Challenger    ESOPHAGEAL STRICTURE 03/06/2007   Qualifier: Diagnosis of  By: Jonny Ruiz MD, Len Blalock    Esophagitis 2006   Essential hypertension 03/08/2007   Chronic  On Benicar    FOOT PAIN 07/25/2009   R foot OA     GERD 02/02/2007   Wedge pillow Protonix prn, Pepcid - d/c   History of pneumonia 12/2016   History of shingles    Hyperlipidemia    IBS 03/06/2007   Qualifier: Diagnosis of  By: Jonny Ruiz MD, Len Blalock    Impaired glucose tolerance 11/22/2010   Kidney cysts    left- Dr Aldean Ast   Lung nodule 05/08/2011  04/15/11 RLL 4 mm nodule on abd CT - [facility] office    Obesity 01/03/2015   Osteoporosis 02/02/2007   Chronic  Vit D, yoga   Palpitations 05/12/2014   Dr Eden Emms 2/16 poss due to steroid pack    Rash and nonspecific skin eruption 09/12/2015   5/17?urticaria Pt is allergic to seafood - the pt is on MegaRed krill oil - d/c MegaRed    Upper airway cough syndrome 12/08/2014   Dr Sherene Sires Flutter valve added 01/02/2015 > resolved 02/03/2015  - rec try off gerd rx p 03/09/15     Family History  Problem Relation Age of Onset   Asthma Mother    Diabetes Father    Cancer Father        liver   Diabetes Sister    Asthma Sister    Breast cancer Sister    Asthma Sister    Diabetes Brother    Asthma Brother    Diabetes  Brother    Thyroid disease Maternal Uncle    Coronary artery disease Other        1 st degree female relative    Social History   Socioeconomic History   Marital status: Widowed    Spouse name: Not on file   Number of children: 3   Years of education: Not on file   Highest education level: Not on file  Occupational History   Occupation: Retired    Associate Professor: RETIRED  Tobacco Use   Smoking status: Never   Smokeless tobacco: Never  Vaping Use   Vaping status: Never Used  Substance and Sexual Activity   Alcohol use: No   Drug use: No   Sexual activity: Not Currently    Birth control/protection: Post-menopausal  Other Topics Concern   Not on file  Social History Narrative   Not on file   Social Determinants of Health   Financial Resource Strain: Low Risk  (09/04/2022)   Overall Financial Resource Strain (CARDIA)    Difficulty of Paying Living Expenses: Not hard at all  Food Insecurity: No Food Insecurity (09/04/2022)   Hunger Vital Sign    Worried About Running Out of Food in the Last Year: Never true    Ran Out of Food in the Last Year: Never true  Transportation Needs: No Transportation Needs (09/04/2022)   PRAPARE - Administrator, Civil Service (Medical): No    Lack of Transportation (Non-Medical): No  Physical Activity: Sufficiently Active (09/04/2022)   Exercise Vital Sign    Days of Exercise per Week: 5 days    Minutes of Exercise per Session: 30 min  Stress: No Stress Concern Present (09/04/2022)   [employer] of Occupational Health - Occupational Stress Questionnaire    Feeling of Stress : Not at all  Social Connections: Moderately Integrated (09/04/2022)   Social Connection and Isolation Panel [NHANES]    Frequency of Communication with Friends and Family: More than three times a week    Frequency of Social Gatherings with Friends and Family: More than three times a week    Attends Religious Services: More than 4 times per year    Active Member of  [employer] or Organizations: Yes    Attends Banker Meetings: More than 4 times per year    Marital Status: Widowed  Intimate Partner Violence: Not At Risk (09/04/2022)   Humiliation, Afraid, Rape, and Kick questionnaire    Fear of Current or Ex-Partner: No    Emotionally Abused: No  Physically Abused: No    Sexually Abused: No    Past Surgical History:  Procedure Laterality Date   APPENDECTOMY     CHOLECYSTECTOMY     HYSTEROSCOPY WITH RESECTOSCOPE  7/99   resect polyp   MOHS SURGERY  05/2018   face   TUBAL LIGATION Bilateral         Physical Exam: Blood pressure 132/80, pulse 82, height 4\' 11"  (1.499 m), weight 166 lb 9.6 oz (75.6 kg), last menstrual period 04/08/1996, SpO2 95%.   Affect appropriate Healthy:  appears stated age HEENT: normal Neck supple with no adenopathy JVP normal no bruits no thyromegaly Lungs clear with no wheezing and good diaphragmatic motion Heart:  S1/S2 no murmur, no rub, gallop or click PMI normal Abdomen: benighn, BS positve, no tenderness, no AAA no bruit.  No HSM or HJR Distal pulses intact with no bruits No edema Neuro non-focal Skin warm and dry No muscular weakness   Labs:   Lab Results  Component Value Date   WBC 4.3 02/19/2023   HGB 12.4 02/19/2023   HCT 38.6 02/19/2023   MCV 88.7 02/19/2023   PLT 190.0 02/19/2023    No results for input(s): "NA", "K", "CL", "CO2", "BUN", "CREATININE", "CALCIUM", "PROT", "BILITOT", "ALKPHOS", "ALT", "AST", "GLUCOSE" in the last 168 hours.  Invalid input(s): "LABALBU"  Lab Results  Component Value Date   CKTOTAL 272 (H) 08/02/2009   CKMB 1.5 06/22/2014   TROPONINI 0.01        NO INDICATION OF MYOCARDIAL INJURY. 08/02/2009    Lab Results  Component Value Date   CHOL 205 (H) 12/19/2021   CHOL 206 (H) 11/29/2020   CHOL 205 (H) 08/26/2019   Lab Results  Component Value Date   HDL 43.00 12/19/2021   HDL 47.00 11/29/2020   HDL 44.90 08/26/2019   Lab Results  Component  Value Date   LDLCALC 137 (H) 12/19/2021   LDLCALC 139 (H) 11/29/2020   LDLCALC 139 (H) 08/26/2019   Lab Results  Component Value Date   TRIG 122.0 12/19/2021   TRIG 102.0 11/29/2020   TRIG 107.0 08/26/2019   Lab Results  Component Value Date   CHOLHDL 5 12/19/2021   CHOLHDL 4 11/29/2020   CHOLHDL 5 08/26/2019   Lab Results  Component Value Date   LDLDIRECT 154.9 05/01/2011      Radiology: No results found.  EKG: 12/29/18 NSR normal ECG  01/04/20 SR rate 80 LAD low voltage 03/03/2023 SR rate 91 normal  03/03/2023 SR LAD normal    ASSESSMENT AND PLAN:   1.  Palpitations/Silent Stroke:  Likely self limited episode of PSVT Continue Toprol Monitor reviewed and no SVT/PAF or serious arrhythmia Recent diagnosis of silent stroke Pattern on MRI seems lacunar / pontine and not embolic  Dr Lalla Brothers did not think ILR needed   2. Chest pain:  In setting of palpitations Normal ECG and previously normal myovue x 3 most recent March 2017 no indication for stress testing currently   3. HTN:  Per primary off BP meds 10/13/19 BP office visit 112/68 mmHg 10/13/19  4. Pulmonary:  Bronchiectasis no recent pneumonia Flu shot f/u pulmonary   F/U in a year new nitro called in    Time:  Spent reviewing chart direct patient tele visit and composing note 20 minutes    Signed: Charlton Haws 03/03/2023, 10:12 AM

## 2023-02-19 ENCOUNTER — Ambulatory Visit: Payer: PPO | Admitting: Internal Medicine

## 2023-02-19 ENCOUNTER — Encounter: Payer: Self-pay | Admitting: Internal Medicine

## 2023-02-19 VITALS — BP 110/78 | HR 82 | Temp 98.6°F | Ht 59.0 in | Wt 162.0 lb

## 2023-02-19 DIAGNOSIS — R270 Ataxia, unspecified: Secondary | ICD-10-CM

## 2023-02-19 DIAGNOSIS — R7302 Impaired glucose tolerance (oral): Secondary | ICD-10-CM

## 2023-02-19 DIAGNOSIS — N281 Cyst of kidney, acquired: Secondary | ICD-10-CM

## 2023-02-19 DIAGNOSIS — N390 Urinary tract infection, site not specified: Secondary | ICD-10-CM

## 2023-02-19 DIAGNOSIS — I2510 Atherosclerotic heart disease of native coronary artery without angina pectoris: Secondary | ICD-10-CM | POA: Diagnosis not present

## 2023-02-19 DIAGNOSIS — I10 Essential (primary) hypertension: Secondary | ICD-10-CM | POA: Diagnosis not present

## 2023-02-19 DIAGNOSIS — M545 Low back pain, unspecified: Secondary | ICD-10-CM | POA: Diagnosis not present

## 2023-02-19 DIAGNOSIS — G8929 Other chronic pain: Secondary | ICD-10-CM

## 2023-02-19 LAB — CBC WITH DIFFERENTIAL/PLATELET
Basophils Absolute: 0 10*3/uL (ref 0.0–0.1)
Basophils Relative: 0.7 % (ref 0.0–3.0)
Eosinophils Absolute: 0.2 10*3/uL (ref 0.0–0.7)
Eosinophils Relative: 3.9 % (ref 0.0–5.0)
HCT: 38.6 % (ref 36.0–46.0)
Hemoglobin: 12.4 g/dL (ref 12.0–15.0)
Lymphocytes Relative: 33.4 % (ref 12.0–46.0)
Lymphs Abs: 1.4 10*3/uL (ref 0.7–4.0)
MCHC: 32 g/dL (ref 30.0–36.0)
MCV: 88.7 fL (ref 78.0–100.0)
Monocytes Absolute: 0.4 10*3/uL (ref 0.1–1.0)
Monocytes Relative: 8.7 % (ref 3.0–12.0)
Neutro Abs: 2.3 10*3/uL (ref 1.4–7.7)
Neutrophils Relative %: 53.3 % (ref 43.0–77.0)
Platelets: 190 10*3/uL (ref 150.0–400.0)
RBC: 4.35 Mil/uL (ref 3.87–5.11)
RDW: 15.1 % (ref 11.5–15.5)
WBC: 4.3 10*3/uL (ref 4.0–10.5)

## 2023-02-19 LAB — COMPREHENSIVE METABOLIC PANEL
ALT: 12 U/L (ref 0–35)
AST: 19 U/L (ref 0–37)
Albumin: 4 g/dL (ref 3.5–5.2)
Alkaline Phosphatase: 91 U/L (ref 39–117)
BUN: 22 mg/dL (ref 6–23)
CO2: 31 meq/L (ref 19–32)
Calcium: 9.2 mg/dL (ref 8.4–10.5)
Chloride: 104 meq/L (ref 96–112)
Creatinine, Ser: 0.74 mg/dL (ref 0.40–1.20)
GFR: 71.45 mL/min (ref >60.00)
Glucose, Bld: 102 mg/dL — ABNORMAL HIGH (ref 70–99)
Potassium: 4 meq/L (ref 3.5–5.1)
Sodium: 141 meq/L (ref 135–145)
Total Bilirubin: 0.5 mg/dL (ref 0.2–1.2)
Total Protein: 7.4 g/dL (ref 6.0–8.3)

## 2023-02-19 LAB — URINALYSIS
Bilirubin Urine: NEGATIVE
Hgb urine dipstick: NEGATIVE
Ketones, ur: NEGATIVE
Leukocytes,Ua: NEGATIVE
Nitrite: NEGATIVE
Specific Gravity, Urine: 1.02 (ref 1.000–1.030)
Total Protein, Urine: NEGATIVE
Urine Glucose: NEGATIVE
Urobilinogen, UA: 0.2 (ref 0.0–1.0)
pH: 7 (ref 5.0–8.0)

## 2023-02-19 LAB — TSH: TSH: 0.58 u[IU]/mL (ref 0.35–5.50)

## 2023-02-19 LAB — HEMOGLOBIN A1C: Hgb A1c MFr Bld: 6 % (ref 4.6–6.5)

## 2023-02-19 NOTE — Assessment & Plan Note (Signed)
On NAS diet Furosemide 2/wk, Kdur

## 2023-02-19 NOTE — Assessment & Plan Note (Signed)
DMV form was filled out

## 2023-02-19 NOTE — Assessment & Plan Note (Signed)
F/u w/renal US

## 2023-02-19 NOTE — Assessment & Plan Note (Signed)
No CP 

## 2023-02-19 NOTE — Assessment & Plan Note (Signed)
Monitor A1c 

## 2023-02-19 NOTE — Progress Notes (Signed)
Subjective:  Patient ID: Diana Stout, female    DOB: September 25, 1930  Age: 89 y.o. MRN: [id number]  CC: Medical Management of Chronic Issues (4 mnth f/u, discuss edema in feet)   HPI Diana Stout presents for OA, HTN, renal cyst  Outpatient Medications Prior to Visit  Medication Sig Dispense Refill   albuterol (VENTOLIN HFA) 108 (90 Base) MCG/ACT inhaler Inhale 1 puff into the lungs every 6 (six) hours as needed. 8 g 3   Apple Cider Vinegar (APPLE CIDER VINEGAR ULTRA) 600 MG CAPS Take by mouth.     aspirin 81 MG tablet Take 81 mg by mouth daily.     cholecalciferol (VITAMIN D) 1000 UNITS tablet Take 1,000 Units by mouth daily.     cyanocobalamin 1000 MCG tablet Take 1,000 mcg by mouth daily.     fluticasone (FLONASE) 50 MCG/ACT nasal spray USE 2 SPRAYS IN EACH NOSTRIL AS NEEDED FOR RHINITIS 48 g 3   furosemide (LASIX) 20 MG tablet TAKE 1 TABLET(20 MG) BY MOUTH DAILY AS NEEDED 90 tablet 1   lidocaine (XYLOCAINE) 2 % solution Take by mouth.     Magnesium 400 MG CAPS Take by mouth daily.     nitrofurantoin, macrocrystal-monohydrate, (MACROBID) 100 MG capsule Take 1 capsule (100 mg total) by mouth 2 (two) times daily. 14 capsule 0   nitroGLYCERIN (NITROSTAT) 0.4 MG SL tablet Place 1 tablet (0.4 mg total) under the tongue every 5 (five) minutes as needed for chest pain. 25 tablet 3   potassium chloride (KLOR-CON M) 10 MEQ tablet TAKE ONCE A DAY WITH FUROSEMIDE AS NEEDED 90 tablet 3   Probiotic Product (ALIGN) 4 MG CAPS Take 1 capsule (4 mg total) by mouth daily. 30 capsule 0   Respiratory Therapy Supplies (FLUTTER) DEVI Use as directed 1 each 0   No facility-administered medications prior to visit.    ROS: Review of Systems  Constitutional:  Negative for activity change, appetite change, chills, fatigue and unexpected weight change.  HENT:  Negative for congestion, mouth sores and sinus pressure.   Eyes:  Negative for visual disturbance.  Respiratory:  Negative for cough and chest  tightness.   Gastrointestinal:  Negative for abdominal pain and nausea.  Genitourinary:  Negative for difficulty urinating, frequency and vaginal pain.  Musculoskeletal:  Positive for arthralgias. Negative for back pain and gait problem.  Skin:  Negative for pallor and rash.  Neurological:  Negative for dizziness, tremors, weakness, numbness and headaches.  Psychiatric/Behavioral:  Negative for confusion and sleep disturbance.     Objective:  BP 110/78 (BP Location: Left Arm, Patient Position: Sitting, Cuff Size: Normal)   Pulse 82   Temp 98.6 F (37 C) (Oral)   Ht 4\' 11"  (1.499 m)   Wt 162 lb (73.5 kg)   LMP 04/08/1996   SpO2 96%   BMI 32.72 kg/m   BP Readings from Last 3 Encounters:  02/19/23 110/78  10/16/22 110/70  09/04/22 122/82    Wt Readings from Last 3 Encounters:  02/19/23 162 lb (73.5 kg)  10/16/22 166 lb (75.3 kg)  09/04/22 166 lb 12.8 oz (75.7 kg)    Physical Exam Constitutional:      General: She is not in acute distress.    Appearance: She is well-developed. She is obese.  HENT:     Head: Normocephalic.     Right Ear: External ear normal.     Left Ear: External ear normal.     Nose: Nose normal.  Eyes:  General:        Right eye: No discharge.        Left eye: No discharge.     Conjunctiva/sclera: Conjunctivae normal.     Pupils: Pupils are equal, round, and reactive to light.  Neck:     Thyroid: No thyromegaly.     Vascular: No JVD.     Trachea: No tracheal deviation.  Cardiovascular:     Rate and Rhythm: Normal rate and regular rhythm.     Heart sounds: Normal heart sounds.  Pulmonary:     Effort: No respiratory distress.     Breath sounds: No stridor. No wheezing.  Abdominal:     General: Bowel sounds are normal. There is no distension.     Palpations: Abdomen is soft. There is no mass.     Tenderness: There is no abdominal tenderness. There is no guarding or rebound.  Musculoskeletal:        General: No tenderness.     Cervical  back: Normal range of motion and neck supple. No rigidity.  Lymphadenopathy:     Cervical: No cervical adenopathy.  Skin:    Findings: No erythema or rash.  Neurological:     Cranial Nerves: No cranial nerve deficit.     Motor: No abnormal muscle tone.     Coordination: Coordination normal.     Deep Tendon Reflexes: Reflexes normal.  Psychiatric:        Behavior: Behavior normal.        Thought Content: Thought content normal.        Judgment: Judgment normal.     Lab Results  Component Value Date   WBC 5.3 05/17/2022   HGB 12.1 05/17/2022   HCT 37.2 05/17/2022   PLT 270.0 05/17/2022   GLUCOSE 107 (H) 10/16/2022   CHOL 205 (H) 12/19/2021   TRIG 122.0 12/19/2021   HDL 43.00 12/19/2021   LDLDIRECT 154.9 05/01/2011   LDLCALC 137 (H) 12/19/2021   ALT 13 05/17/2022   AST 16 05/17/2022   NA 141 10/16/2022   K 4.1 10/16/2022   CL 104 10/16/2022   CREATININE 0.78 10/16/2022   BUN 20 10/16/2022   CO2 30 10/16/2022   TSH 0.92 05/17/2022   HGBA1C 6.2 12/19/2021    CT HEAD WO CONTRAST ( )  Result Date: 06/14/2022 CLINICAL DATA:  89 year old female with unsteady gait. EXAM: CT HEAD WITHOUT CONTRAST TECHNIQUE: Contiguous axial images were obtained from the base of the skull through the vertex without intravenous contrast. RADIATION DOSE REDUCTION: This exam was performed according to the departmental dose-optimization program which includes automated exposure control, adjustment of the mA and/or kV according to patient size and/or use of iterative reconstruction technique. COMPARISON:  12/22/2020 brain MR FINDINGS: Brain: No evidence of acute infarction, hemorrhage, hydrocephalus, extra-axial collection or mass lesion/mass effect. Mild atrophy and mild chronic small-vessel white matter ischemic changes again noted. Vascular: Carotid atherosclerotic calcifications are noted. Skull: Normal. Negative for fracture or focal lesion. Sinuses/Orbits: No acute finding. Other: None. IMPRESSION:  1. No evidence of acute intracranial abnormality. 2. Mild atrophy and mild chronic small-vessel white matter ischemic changes. Electronically Signed   By: Harmon Pier M.D.   On: 06/14/2022 15:51    Assessment & Plan:   Problem List Items Addressed This Visit     Essential hypertension - Primary    On NAS diet Furosemide 2/wk, Kdur      Relevant Orders   CBC with Differential/Platelet   Comprehensive metabolic panel   Hemoglobin A1c  TSH   Urinalysis   Impaired glucose tolerance    Monitor A1c      Relevant Orders   Hemoglobin A1c   Kidney cyst, acquired    F/u w/renal US      Relevant Orders   US RENAL   CAD (coronary artery disease)    No CP      Low back pain    DMV form was filled out      Ataxia    Use a cane      Relevant Orders   Urinalysis      No orders of the defined types were placed in this encounter.     Follow-up: Return in about 6 months (around 08/19/2023) for Wellness Exam.  Sonda Primes, MD

## 2023-02-19 NOTE — Assessment & Plan Note (Signed)
Use a cane 

## 2023-03-03 ENCOUNTER — Encounter: Payer: Self-pay | Admitting: Cardiovascular Disease

## 2023-03-03 ENCOUNTER — Ambulatory Visit: Payer: PPO | Attending: Cardiovascular Disease | Admitting: Cardiovascular Disease

## 2023-03-03 VITALS — BP 132/80 | HR 82 | Ht 59.0 in | Wt 166.6 lb

## 2023-03-03 DIAGNOSIS — J479 Bronchiectasis, uncomplicated: Secondary | ICD-10-CM

## 2023-03-03 DIAGNOSIS — R002 Palpitations: Secondary | ICD-10-CM | POA: Diagnosis not present

## 2023-03-03 NOTE — Patient Instructions (Signed)
Medication Instructions:  Your physician recommends that you continue on your current medications as directed. Please refer to the Current Medication list given to you today.  *If you need a refill on your cardiac medications before your next appointment, please call your pharmacy*   Lab Work: NONE If you have labs (blood work) drawn today and your tests are completely normal, you will receive your results only by: [place] (if you have MyChart) OR A paper copy in the mail If you have any lab test that is abnormal or we need to change your treatment, we will call you to review the results.   Testing/Procedures: NONE   Follow-Up: At [facility], you and your health needs are our priority.  As part of our continuing mission to provide you with exceptional heart care, we have created designated Provider Care Teams.  These Care Teams include your primary Cardiologist (physician) and Advanced Practice Providers (APPs -  Physician Assistants and Nurse Practitioners) who all work together to provide you with the care you need, when you need it.   Your next appointment:   1 year(s)  Provider:   Jenkins Rouge, MD

## 2023-03-04 ENCOUNTER — Ambulatory Visit
Admission: RE | Admit: 2023-03-04 | Discharge: 2023-03-04 | Disposition: A | Discharge status: Home / Self Care | Payer: PPO | Source: Ambulatory Visit | Attending: Internal Medicine | Admitting: Internal Medicine

## 2023-03-04 DIAGNOSIS — N281 Cyst of kidney, acquired: Secondary | ICD-10-CM

## 2023-03-25 ENCOUNTER — Ambulatory Visit: Payer: PPO | Admitting: Adult Health

## 2023-03-25 ENCOUNTER — Encounter: Payer: Self-pay | Admitting: Emergency Medicine

## 2023-03-25 ENCOUNTER — Ambulatory Visit (INDEPENDENT_AMBULATORY_CARE_PROVIDER_SITE_OTHER): Payer: PPO | Admitting: Emergency Medicine

## 2023-03-25 VITALS — BP 122/78 | HR 93 | Temp 98.0°F | Ht 59.0 in | Wt 162.0 lb

## 2023-03-25 DIAGNOSIS — J22 Unspecified acute lower respiratory infection: Secondary | ICD-10-CM | POA: Diagnosis not present

## 2023-03-25 DIAGNOSIS — R051 Acute cough: Secondary | ICD-10-CM | POA: Diagnosis not present

## 2023-03-25 MED ORDER — BENZONATATE 200 MG PO CAPS: 200.0000 mg | ORAL_CAPSULE | Freq: Two times a day (BID) | ORAL | 0 refills | Status: AC | PRN

## 2023-03-25 MED ORDER — DM-GUAIFENESIN ER 30-600 MG PO TB12: 1.0000 | ORAL_TABLET | Freq: Two times a day (BID) | ORAL | 1 refills | Status: AC

## 2023-03-25 MED ORDER — AZITHROMYCIN 250 MG PO TABS: ORAL_TABLET | ORAL | 0 refills | Status: AC

## 2023-03-25 NOTE — Assessment & Plan Note (Signed)
Viral respiratory infection now with secondary bacterial infection Recommend azithromycin daily for 5 days Clinically stable.  No signs of pneumonia. Afebrile.  No wheezing on physical examination. Advised to rest and stay well-hydrated Advised to contact the office if no better or worse during the next several days.

## 2023-03-25 NOTE — Patient Instructions (Signed)

## 2023-03-25 NOTE — Assessment & Plan Note (Signed)
Cough management discussed Advised to rest and stay well-hydrated Recommend over-the-counter Mucinex DM Start Tessalon 200 mg 3 times a day

## 2023-03-25 NOTE — Progress Notes (Signed)
Diana Stout 89 y.o.   Chief Complaint  Patient presents with   Cough    Patient states she has been for 2 weeks. States she started to feel better then came back. Head cold moved to her chest. No fever, few chills.     HISTORY OF PRESENT ILLNESS: Acute problem visit today. This is a 89 y.o. female complaining of flulike symptoms and productive cough that started about 2 weeks ago It improve after several days but symptoms came back about 3 to 4 days ago.  Denies fever or chills.  Denies difficulty breathing or chest pain. At times feels "wheezy". No other complaints or medical concerns today.  Cough Associated symptoms include wheezing. Pertinent negatives include no chest pain, chills, fever, headaches, hemoptysis, rash or shortness of breath.     Prior to Admission medications   Medication Sig Start Date End Date Taking? Authorizing Provider  albuterol (VENTOLIN HFA) 108 (90 Base) MCG/ACT inhaler Inhale 1 puff into the lungs every 6 (six) hours as needed. 05/30/21  Yes Parrett, Virgel Bouquet, NP  Apple Cider Vinegar (APPLE CIDER VINEGAR ULTRA) 600 MG CAPS Take by mouth.   Yes [provider]  aspirin 81 MG tablet Take 81 mg by mouth daily.   Yes [provider]  cholecalciferol (VITAMIN D) 1000 UNITS tablet Take 1,000 Units by mouth daily.   Yes [provider]  cyanocobalamin 1000 MCG tablet Take 1,000 mcg by mouth daily.   Yes [provider]  fluticasone (FLONASE) 50 MCG/ACT nasal spray USE 2 SPRAYS IN EACH NOSTRIL AS NEEDED FOR RHINITIS 06/19/22  Yes Plotnikov, Georgina Quint, MD  furosemide (LASIX) 20 MG tablet TAKE 1 TABLET(20 MG) BY MOUTH DAILY AS NEEDED 10/16/22  Yes Plotnikov, Georgina Quint, MD  lidocaine (XYLOCAINE) 2 % solution Take by mouth. 05/04/22  Yes [provider]  Magnesium 400 MG CAPS Take by mouth daily.   Yes [provider]  nitrofurantoin, macrocrystal-monohydrate, (MACROBID) 100 MG capsule Take 1 capsule (100 mg  total) by mouth 2 (two) times daily. 05/29/22  Yes Ardith Dark, MD  nitroGLYCERIN (NITROSTAT) 0.4 MG SL tablet Place 1 tablet (0.4 mg total) under the tongue every 5 (five) minutes as needed for chest pain. 06/19/22  Yes Plotnikov, Georgina Quint, MD  potassium chloride (KLOR-CON M) 10 MEQ tablet TAKE ONCE A DAY WITH FUROSEMIDE AS NEEDED 10/16/22  Yes Plotnikov, Georgina Quint, MD  Probiotic Product (ALIGN) 4 MG CAPS Take 1 capsule (4 mg total) by mouth daily. 01/01/17  Yes Plotnikov, Georgina Quint, MD  Respiratory Therapy Supplies (FLUTTER) DEVI Use as directed 01/02/15  Yes Nyoka Cowden, MD    Allergies  Allergen Reactions   Atorvastatin Nausea And Vomiting    REACTION: Gi pain   Alendronate Sodium Other (See Comments)    REACTION: esophagus problem   Ceftriaxone Sodium Rash    REACTION: rash   Codeine Phosphate Other (See Comments)    REACTION: nightmares   Moxifloxacin Palpitations    REACTION: nausea/chest pain She can take Cipro   Simvastatin Other (See Comments)    indigestion    Patient Active Problem List   Diagnosis Date Noted   Bilateral impacted cerumen 11/07/2022   OAB (overactive bladder) 06/19/2022   Falls, sequela 05/16/2022   History of COVID-19 05/16/2022   Ataxia 05/16/2022   Hyperkalemia 06/10/2021   Chronic bronchitis (HCC) 05/30/2021   Lacunar stroke (HCC) 03/08/2021   Prediabetes 01/02/2021   History of CVA (cerebrovascular accident) without residual deficits  01/01/2021   Balance problem 11/29/2020   Acute cystitis with hematuria 01/31/2020   Recurrent UTI 01/31/2020   Low back pain 10/13/2019   CAD (coronary artery disease) 03/04/2018   Lightheadedness 11/05/2017   Canker sore 01/01/2017   Kidney cyst, acquired 07/22/2016   Rash and nonspecific skin eruption 09/12/2015   Obesity 01/03/2015   Upper airway cough syndrome 12/08/2014   Right-sided thoracic back pain 05/12/2014   Palpitations 05/12/2014   Snoring 12/22/2013   Diarrhea 07/19/2013   Dizziness  07/19/2013   Fatigue 10/15/2011   Impaired glucose tolerance 11/22/2010   Well adult exam 11/22/2010   Abdominal pain, lower 11/22/2010   SYNCOPE 09/27/2009   Edema 09/27/2009   Recurrent pneumonia 08/15/2009   ANEMIA, NORMOCYTIC 08/10/2009   SKIN RASH 08/10/2009   FOOT PAIN 07/25/2009   Dyspnea 07/01/2008   Acute bronchitis 06/12/2007   Essential hypertension 03/08/2007   DIVERTICULOSIS, COLON 03/08/2007   ESOPHAGEAL STRICTURE 03/06/2007   IBS 03/06/2007   SHINGLES, HX OF 03/06/2007   Dyslipidemia 02/02/2007   GERD 02/02/2007   Osteoporosis 02/02/2007   DVT, HX OF 02/02/2007    Past Medical History:  Diagnosis Date   Abdominal pain, lower 11/22/2010   2015 resolved off Zocor 5/17?urticaria w/GI upset: Dtr is allergic to seafood - the pt is on MegaRed krill oil - d/c MegaRed    Abnormal chest CT 05/22/2011   Followed in Pulmonary clinic/ [facility]/ [facility]  05/14/11 CT CHEST WITHOUT CONTRAST 1. Multiple small pulmonary nodules scattered throughout the lungs  bilaterally ranging in size from 3-6 mm.    2. There is a small hiatal hernia.  3. Atherosclerosis.  4. Status post cholecystectomy.  5. Low attenuation hepatic lesions unchanged compared to recent  abdominal CT scan, likely to represent cysts   ANEMIA, NORMOCYTIC 08/10/2009   mild     DIVERTICULOSIS, COLON 03/08/2007   Qualifier: Diagnosis of  By: Jonny Ruiz MD, Len Blalock    Dizziness 07/19/2013   Likely due to diarrhea/meds 4/15    DVT (deep venous thrombosis) (HCC) 1998   hx of right   Dyslipidemia 02/02/2007   Chronic 1/13 worse     Dyspnea 07/01/2008   2/13 improved w/reg exercise 1/17 deconditioning - CL stress test was nl     Edema 09/27/2009   Qualifier: Diagnosis of  By: Eden Emms, MD, Harrington Challenger    ESOPHAGEAL STRICTURE 03/06/2007   Qualifier: Diagnosis of  By: Jonny Ruiz MD, Len Blalock    Esophagitis 2006   Essential hypertension 03/08/2007   Chronic  On Benicar    FOOT PAIN 07/25/2009   R foot OA     GERD 02/02/2007    Wedge pillow Protonix prn, Pepcid - d/c   History of pneumonia 12/2016   History of shingles    Hyperlipidemia    IBS 03/06/2007   Qualifier: Diagnosis of  By: Jonny Ruiz MD, Len Blalock    Impaired glucose tolerance 11/22/2010   Kidney cysts    left- Dr Aldean Ast   Lung nodule 05/08/2011   04/15/11 RLL 4 mm nodule on abd CT - [facility] office    Obesity 01/03/2015   Osteoporosis 02/02/2007   Chronic  Vit D, yoga   Palpitations 05/12/2014   Dr Eden Emms 2/16 poss due to steroid pack    Rash and nonspecific skin eruption 09/12/2015   5/17?urticaria Pt is allergic to seafood - the pt is on MegaRed krill oil - d/c MegaRed    Upper airway cough syndrome 12/08/2014   Dr  [facility] Flutter valve added 01/02/2015 > resolved 02/03/2015  - rec try off gerd rx p 03/09/15     Past Surgical History:  Procedure Laterality Date   APPENDECTOMY     CHOLECYSTECTOMY     HYSTEROSCOPY WITH RESECTOSCOPE  7/99   resect polyp   MOHS SURGERY  05/2018   face   TUBAL LIGATION Bilateral     Social History   Socioeconomic History   Marital status: Widowed    Spouse name: Not on file   Number of children: 3   Years of education: Not on file   Highest education level: Not on file  Occupational History   Occupation: Retired    Associate Professor: RETIRED  Tobacco Use   Smoking status: Never   Smokeless tobacco: Never  Vaping Use   Vaping status: Never Used  Substance and Sexual Activity   Alcohol use: No   Drug use: No   Sexual activity: Not Currently    Birth control/protection: Post-menopausal  Other Topics Concern   Not on file  Social History Narrative   Not on file   Social Drivers of Health   Financial Resource Strain: Low Risk  (09/04/2022)   Overall Financial Resource Strain (CARDIA)    Difficulty of Paying Living Expenses: Not hard at all  Food Insecurity: No Food Insecurity (09/04/2022)   Hunger Vital Sign    Worried About Running Out of Food in the Last Year: Never true    Ran Out of Food in the Last Year: Never  true  Transportation Needs: No Transportation Needs (09/04/2022)   PRAPARE - Administrator, Civil Service (Medical): No    Lack of Transportation (Non-Medical): No  Physical Activity: Sufficiently Active (09/04/2022)   Exercise Vital Sign    Days of Exercise per Week: 5 days    Minutes of Exercise per Session: 30 min  Stress: No Stress Concern Present (09/04/2022)   [employer] of Occupational Health - Occupational Stress Questionnaire    Feeling of Stress : Not at all  Social Connections: Moderately Integrated (09/04/2022)   Social Connection and Isolation Panel [NHANES]    Frequency of Communication with Friends and Family: More than three times a week    Frequency of Social Gatherings with Friends and Family: More than three times a week    Attends Religious Services: More than 4 times per year    Active Member of [employer] or Organizations: Yes    Attends Banker Meetings: More than 4 times per year    Marital Status: Widowed  Intimate Partner Violence: Not At Risk (09/04/2022)   Humiliation, Afraid, Rape, and Kick questionnaire    Fear of Current or Ex-Partner: No    Emotionally Abused: No    Physically Abused: No    Sexually Abused: No    Family History  Problem Relation Age of Onset   Asthma Mother    Diabetes Father    Cancer Father        liver   Diabetes Sister    Asthma Sister    Breast cancer Sister    Asthma Sister    Diabetes Brother    Asthma Brother    Diabetes Brother    Thyroid disease Maternal Uncle    Coronary artery disease Other        1 st degree female relative     Review of Systems  Constitutional: Negative.  Negative for chills and fever.  HENT:  Positive for congestion.   Respiratory:  Positive for cough and wheezing. Negative for hemoptysis, sputum production and shortness of breath.   Cardiovascular: Negative.  Negative for chest pain and palpitations.  Gastrointestinal:  Negative for abdominal pain, diarrhea, nausea  and vomiting.  Genitourinary: Negative.  Negative for dysuria and hematuria.  Skin: Negative.  Negative for rash.  Neurological:  Negative for dizziness and headaches.  All other systems reviewed and are negative.   Vitals:   03/25/23 1448  BP: 122/78  Pulse: 93  Temp: 98 F (36.7 C)  SpO2: 94%    Physical Exam Vitals reviewed.  Constitutional:      Appearance: Normal appearance.  HENT:     Head: Normocephalic.  Eyes:     Extraocular Movements: Extraocular movements intact.     Conjunctiva/sclera: Conjunctivae normal.     Pupils: Pupils are equal, round, and reactive to light.  Cardiovascular:     Rate and Rhythm: Normal rate and regular rhythm.     Pulses: Normal pulses.     Heart sounds: Normal heart sounds.  Pulmonary:     Effort: Pulmonary effort is normal.     Breath sounds: Normal breath sounds.  Musculoskeletal:     Cervical back: No tenderness.  Lymphadenopathy:     Cervical: No cervical adenopathy.  Skin:    General: Skin is warm and dry.     Capillary Refill: Capillary refill takes less than 2 seconds.  Neurological:     General: No focal deficit present.     Mental Status: She is alert and oriented to person, place, and time.  Psychiatric:        Mood and Affect: Mood normal.        Behavior: Behavior normal.      ASSESSMENT & PLAN: A total of 32 minutes was spent with the patient and counseling/coordination of care regarding preparing for this visit, review of most recent office visit notes, review of multiple chronic medical conditions under management, review of all medications, diagnosis of lower respiratory infection and need to start antibiotics, symptom management, prognosis, documentation, and need for follow-up if no better or worse during the next several days.  Problem List Items Addressed This Visit       Respiratory   Lower respiratory infection - Primary   Viral respiratory infection now with secondary bacterial infection Recommend  azithromycin daily for 5 days Clinically stable.  No signs of pneumonia. Afebrile.  No wheezing on physical examination. Advised to rest and stay well-hydrated Advised to contact the office if no better or worse during the next several days.      Relevant Medications   azithromycin (ZITHROMAX) 250 MG tablet     Other   Acute cough   Cough management discussed Advised to rest and stay well-hydrated Recommend over-the-counter Mucinex DM Start Tessalon 200 mg 3 times a day      Relevant Medications   benzonatate (TESSALON) 200 MG capsule   dextromethorphan-guaiFENesin (MUCINEX DM) 30-600 MG 12hr tablet   Patient Instructions  Cough, Adult A cough helps to clear your throat and lungs. It may be a sign of an illness or another condition. A short-term (acute) cough may last 2-3 weeks. A long-term (chronic) cough may last 8 or more weeks. Many things can cause a cough. They include: Illnesses such as: An infection in your throat or lungs. Asthma or other heart or lung problems. Gastroesophageal reflux. This is when acid comes back up from your stomach. Breathing in things that bother (irritate) your lungs. Allergies. Postnasal  drip. This is when mucus runs down the back of your throat. Smoking. Some medicines. Follow these instructions at home: Medicines Take over-the-counter and prescription medicines only as told by your doctor. Talk with your doctor before you take cough medicine (cough suppressants). Eating and drinking Do not drink alcohol. Do not drink caffeine. Drink enough fluid to keep your pee (urine) pale yellow. Lifestyle Stay away from cigarette smoke. Do not smoke or use any products that contain nicotine or tobacco. If you need help quitting, ask your doctor. Stay away from things that make you cough. These may include perfume, candles, cleaning products, or campfire smoke. General instructions  Watch for any changes to your cough. Tell your doctor about  them. Always cover your mouth when you cough. If the air is dry in your home, use a cool mist vaporizer or humidifier. If your cough is worse at night, try using extra pillows to raise your head up higher while you sleep. Rest as needed. Contact a doctor if: You have new symptoms. Your symptoms get worse. You cough up pus. You have a fever that does not go away. Your cough does not get better after 2-3 weeks. Cough medicine does not help, and you are not sleeping well. You have pain that gets worse or is not helped with medicine. You are losing weight and do not know why. You have night sweats. Get help right away if: You cough up blood. You have trouble breathing. Your heart is beating very fast. These symptoms may be an emergency. Get help right away. Call 911. Do not wait to see if the symptoms will go away. Do not drive yourself to the hospital. This information is not intended to replace advice given to you by your health care provider. Make sure you discuss any questions you have with your health care provider. Document Revised: 11/23/2021 Document Reviewed: 11/23/2021 Elsevier Patient Education  2024 Elsevier Inc.     Edwina Barth, MD [facility]

## 2023-05-09 DIAGNOSIS — N3020 Other chronic cystitis without hematuria: Secondary | ICD-10-CM | POA: Diagnosis not present

## 2023-05-09 DIAGNOSIS — R8271 Bacteriuria: Secondary | ICD-10-CM | POA: Diagnosis not present

## 2023-06-07 IMAGING — DX DG CHEST 2V
2 series · 2 of 2 positions shown · non-contrast
Comparison: Chest x-ray 10/22/2016.

CLINICAL DATA: Asthma and bronchitis.

EXAM:
CHEST - 2 VIEW

[chest pa]
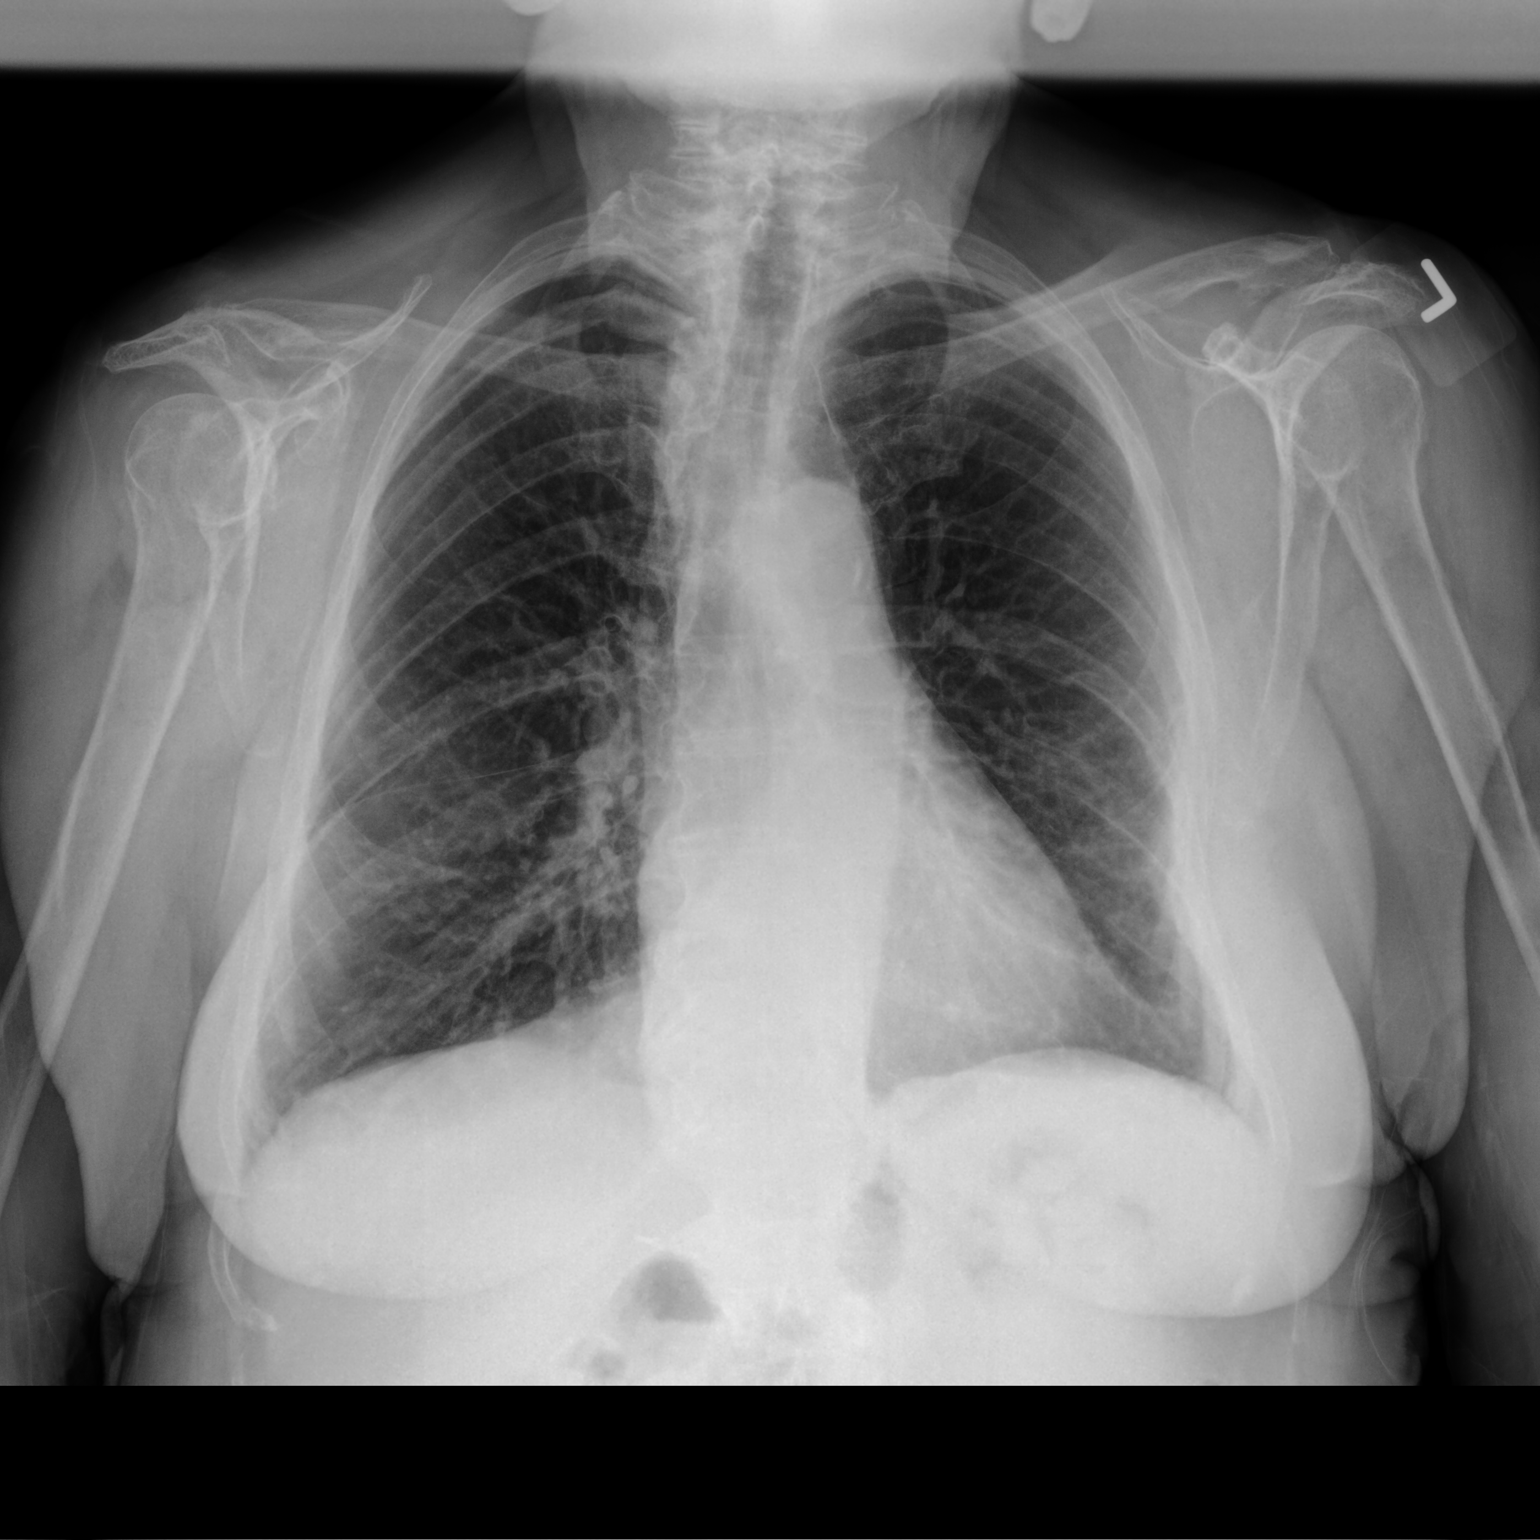

[chest lat]
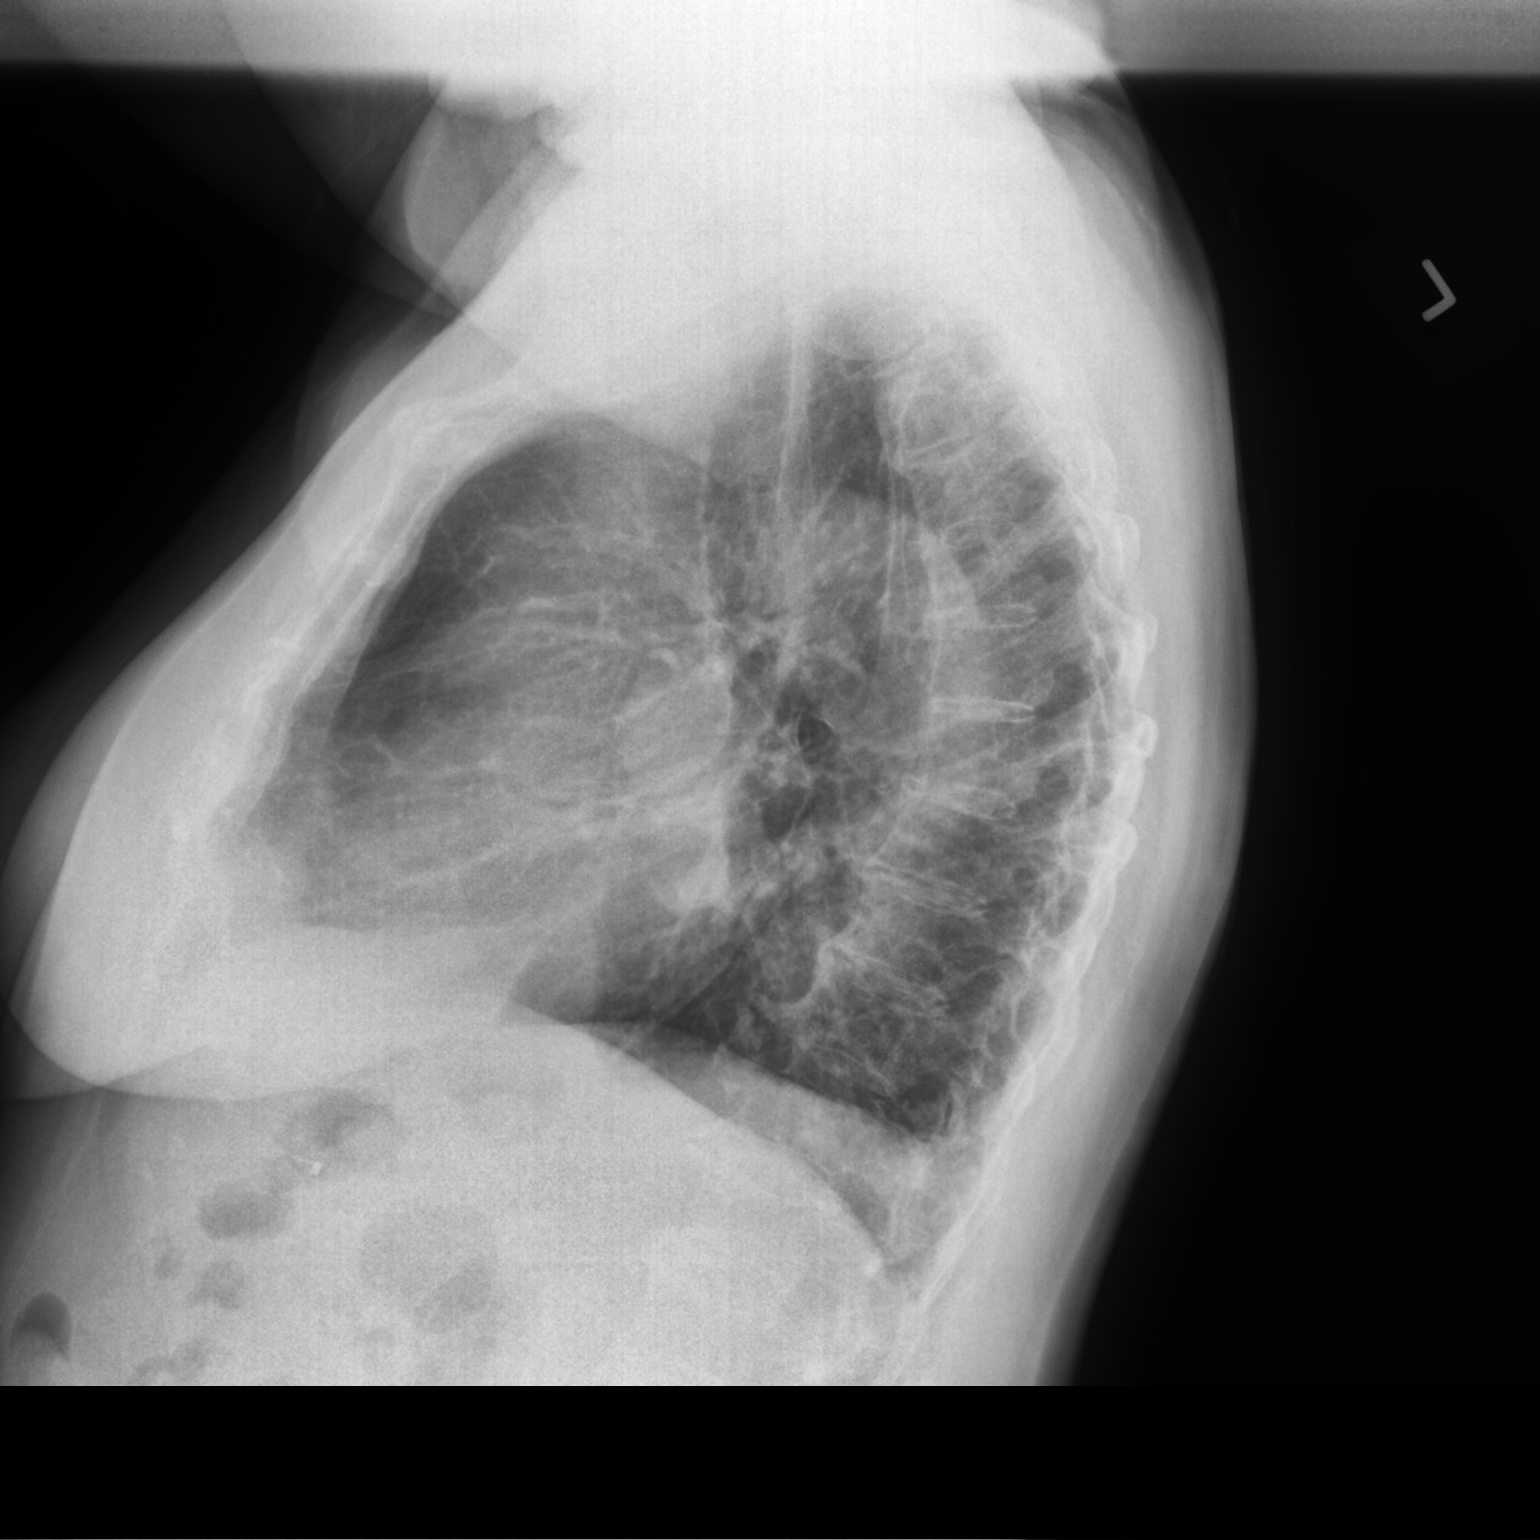

[2 of 2 positions shown; findings below may reference images not displayed]

FINDINGS: There is some interstitial opacities in the lower lungs. There is no
lung consolidation, pleural effusion or pneumothorax. The
cardiomediastinal silhouette is within normal limits. No acute
fractures are seen.
IMPRESSION: 1. Minimal interstitial opacities in the lung bases may represent
mild edema or infection. There is no focal lung infiltrate.

## 2023-08-19 ENCOUNTER — Ambulatory Visit: Payer: PPO | Admitting: Internal Medicine

## 2023-08-22 ENCOUNTER — Other Ambulatory Visit: Payer: Self-pay

## 2023-08-22 ENCOUNTER — Ambulatory Visit: Admitting: Internal Medicine

## 2023-08-22 ENCOUNTER — Telehealth: Payer: Self-pay | Admitting: Internal Medicine

## 2023-08-22 ENCOUNTER — Encounter: Payer: Self-pay | Admitting: Internal Medicine

## 2023-08-22 VITALS — BP 118/60 | HR 76 | Temp 97.6°F | Ht 59.0 in | Wt 166.6 lb

## 2023-08-22 DIAGNOSIS — N390 Urinary tract infection, site not specified: Secondary | ICD-10-CM

## 2023-08-22 DIAGNOSIS — E785 Hyperlipidemia, unspecified: Secondary | ICD-10-CM

## 2023-08-22 DIAGNOSIS — I10 Essential (primary) hypertension: Secondary | ICD-10-CM | POA: Diagnosis not present

## 2023-08-22 DIAGNOSIS — R7303 Prediabetes: Secondary | ICD-10-CM

## 2023-08-22 DIAGNOSIS — N3000 Acute cystitis without hematuria: Secondary | ICD-10-CM

## 2023-08-22 DIAGNOSIS — R609 Edema, unspecified: Secondary | ICD-10-CM | POA: Diagnosis not present

## 2023-08-22 LAB — COMPREHENSIVE METABOLIC PANEL WITH GFR
ALT: 14 U/L (ref 0–35)
AST: 20 U/L (ref 0–37)
Albumin: 4.1 g/dL (ref 3.5–5.2)
Alkaline Phosphatase: 82 U/L (ref 39–117)
BUN: 21 mg/dL (ref 6–23)
CO2: 31 meq/L (ref 19–32)
Calcium: 9.3 mg/dL (ref 8.4–10.5)
Chloride: 102 meq/L (ref 96–112)
Creatinine, Ser: 0.78 mg/dL (ref 0.40–1.20)
GFR: 66.84 mL/min (ref >60.00)
Glucose, Bld: 111 mg/dL — ABNORMAL HIGH (ref 70–99)
Potassium: 4.7 meq/L (ref 3.5–5.1)
Sodium: 141 meq/L (ref 135–145)
Total Bilirubin: 0.6 mg/dL (ref 0.2–1.2)
Total Protein: 7.3 g/dL (ref 6.0–8.3)

## 2023-08-22 LAB — HEMOGLOBIN A1C: Hgb A1c MFr Bld: 6.2 % (ref 4.6–6.5)

## 2023-08-22 MED ORDER — FUROSEMIDE 20 MG PO TABS: ORAL_TABLET | ORAL | 1 refills | Status: DC

## 2023-08-22 MED ORDER — NITROGLYCERIN 0.4 MG SL SUBL: 0.4000 mg | SUBLINGUAL_TABLET | SUBLINGUAL | 3 refills | Status: AC | PRN

## 2023-08-22 MED ORDER — POTASSIUM CHLORIDE CRYS ER 10 MEQ PO TBCR: EXTENDED_RELEASE_TABLET | ORAL | 3 refills | Status: DC

## 2023-08-22 NOTE — Patient Instructions (Signed)
 [employer]: something like LEOSTEP Fun Compression Socks for Women & Men Circulation, Long Stockings Support for [employer]

## 2023-08-22 NOTE — Telephone Encounter (Signed)
 Prescription Request  08/22/2023  LOV: 08/22/2023  What is the name of the medication or equipment? Nitroglycerine, potassium  and furosemide   Have you contacted your pharmacy to request a refill? Yes   Which pharmacy would you like this sent to?  [id number] - [place], [place] - [address] AT [facility] OF [address] [place] [address] Phone: [phone] Fax: [phone]     Patient notified that their request is being sent to the clinical staff for review and that they should receive a response within 2 business days.   Please advise at Mobile [phone] (mobile)

## 2023-08-22 NOTE — Assessment & Plan Note (Signed)
 Using aple cider vinegar capsules for prevention

## 2023-08-22 NOTE — Progress Notes (Signed)
 Subjective:  Patient ID: Diana Stout, female    DOB: Sep 13, 1930  Age: 89 y.o. MRN: [id number]  CC: Medical Management of Chronic Issues (6 Month Follow Up. Patient notes some worsening bilateral edema. Does currently take a diuretic. Also has raised knot/cyst on right side of neck, hard to the touch.)   HPI Diana Stout presents for leg edema, HTN, OA  Outpatient Medications Prior to Visit  Medication Sig Dispense Refill   Apple Cider Vinegar (APPLE CIDER VINEGAR ULTRA) 600 MG CAPS Take by mouth.     cholecalciferol (VITAMIN D ) 1000 UNITS tablet Take 1,000 Units by mouth daily.     cyanocobalamin  1000 MCG tablet Take 1,000 mcg by mouth daily.     furosemide  (LASIX ) 20 MG tablet TAKE 1 TABLET(20 MG) BY MOUTH DAILY AS NEEDED 90 tablet 1   potassium chloride  (KLOR-CON  M) 10 MEQ tablet TAKE ONCE A DAY WITH FUROSEMIDE  AS NEEDED 90 tablet 3   Probiotic Product (ALIGN) 4 MG CAPS Take 1 capsule (4 mg total) by mouth daily. 30 capsule 0   albuterol  (VENTOLIN  HFA) 108 (90 Base) MCG/ACT inhaler Inhale 1 puff into the lungs every 6 (six) hours as needed. (Patient not taking: Reported on 08/22/2023) 8 g 3   aspirin 81 MG tablet Take 81 mg by mouth daily. (Patient not taking: Reported on 08/22/2023)     azithromycin  (ZITHROMAX ) 250 MG tablet Sig as indicated (Patient not taking: Reported on 08/22/2023) 6 tablet 0   benzonatate  (TESSALON ) 200 MG capsule Take 1 capsule (200 mg total) by mouth 2 (two) times daily as needed for cough. (Patient not taking: Reported on 08/22/2023) 20 capsule 0   dextromethorphan-guaiFENesin  (MUCINEX  DM) 30-600 MG 12hr tablet Take 1 tablet by mouth 2 (two) times daily. (Patient not taking: Reported on 08/22/2023) 20 tablet 1   fluticasone  (FLONASE ) 50 MCG/ACT nasal spray USE 2 SPRAYS IN EACH NOSTRIL AS NEEDED FOR RHINITIS (Patient not taking: Reported on 08/22/2023) 48 g 3   lidocaine (XYLOCAINE) 2 % solution Take by mouth. (Patient not taking: Reported on 08/22/2023)      Magnesium 400 MG CAPS Take by mouth daily. (Patient not taking: Reported on 08/22/2023)     nitrofurantoin , macrocrystal-monohydrate, (MACROBID ) 100 MG capsule Take 1 capsule (100 mg total) by mouth 2 (two) times daily. (Patient not taking: Reported on 08/22/2023) 14 capsule 0   nitroGLYCERIN  (NITROSTAT ) 0.4 MG SL tablet Place 1 tablet (0.4 mg total) under the tongue every 5 (five) minutes as needed for chest pain. (Patient not taking: Reported on 08/22/2023) 25 tablet 3   Respiratory Therapy Supplies (FLUTTER) DEVI Use as directed (Patient not taking: Reported on 08/22/2023) 1 each 0   No facility-administered medications prior to visit.    ROS: Review of Systems  Constitutional:  Negative for activity change, appetite change, chills, fatigue and unexpected weight change.  HENT:  Negative for congestion, mouth sores and sinus pressure.   Eyes:  Negative for visual disturbance.  Respiratory:  Negative for cough and chest tightness.   Cardiovascular:  Positive for leg swelling.  Gastrointestinal:  Negative for abdominal pain and nausea.  Genitourinary:  Negative for difficulty urinating, frequency and vaginal pain.  Musculoskeletal:  Positive for arthralgias, gait problem and neck stiffness. Negative for back pain.  Skin:  Negative for pallor and rash.  Neurological:  Negative for dizziness, tremors, weakness, numbness and headaches.  Psychiatric/Behavioral:  Negative for confusion and sleep disturbance.     Objective:  BP 118/60   Pulse  76   Temp 97.6 F (36.4 C)   Ht 4\' 11"  (1.499 m)   Wt 166 lb 9.6 oz (75.6 kg)   LMP 04/08/1996   SpO2 98%   BMI 33.65 kg/m   BP Readings from Last 3 Encounters:  08/22/23 118/60  03/25/23 122/78  03/03/23 132/80    Wt Readings from Last 3 Encounters:  08/22/23 166 lb 9.6 oz (75.6 kg)  03/25/23 162 lb (73.5 kg)  03/03/23 166 lb 9.6 oz (75.6 kg)    Physical Exam Constitutional:      General: She is not in acute distress.    Appearance: She  is well-developed. She is obese.  HENT:     Head: Normocephalic.     Right Ear: External ear normal.     Left Ear: External ear normal.     Nose: Nose normal.  Eyes:     General:        Right eye: No discharge.        Left eye: No discharge.     Conjunctiva/sclera: Conjunctivae normal.     Pupils: Pupils are equal, round, and reactive to light.  Neck:     Thyroid : No thyromegaly.     Vascular: No JVD.     Trachea: No tracheal deviation.  Cardiovascular:     Rate and Rhythm: Normal rate and regular rhythm.     Heart sounds: Normal heart sounds.  Pulmonary:     Effort: No respiratory distress.     Breath sounds: No stridor. No wheezing.  Abdominal:     General: Bowel sounds are normal. There is no distension.     Palpations: Abdomen is soft. There is no mass.     Tenderness: There is no abdominal tenderness. There is no guarding or rebound.  Musculoskeletal:        General: No tenderness.     Cervical back: Normal range of motion and neck supple. No rigidity.     Right lower leg: No edema.     Left lower leg: No edema.  Lymphadenopathy:     Cervical: No cervical adenopathy.  Skin:    Findings: No erythema or rash.  Neurological:     Mental Status: She is oriented to person, place, and time.     Cranial Nerves: No cranial nerve deficit.     Motor: No abnormal muscle tone.     Coordination: Coordination normal.     Gait: Gait abnormal.     Deep Tendon Reflexes: Reflexes normal.  Psychiatric:        Behavior: Behavior normal.        Thought Content: Thought content normal.        Judgment: Judgment normal.   Using a cane B trace edema     Lab Results  Component Value Date   WBC 4.3 02/19/2023   HGB 12.4 02/19/2023   HCT 38.6 02/19/2023   PLT 190.0 02/19/2023   GLUCOSE 102 (H) 02/19/2023   CHOL 205 (H) 12/19/2021   TRIG 122.0 12/19/2021   HDL 43.00 12/19/2021   LDLDIRECT 154.9 05/01/2011   LDLCALC 137 (H) 12/19/2021   ALT 12 02/19/2023   AST 19 02/19/2023    NA 141 02/19/2023   K 4.0 02/19/2023   CL 104 02/19/2023   CREATININE 0.74 02/19/2023   BUN 22 02/19/2023   CO2 31 02/19/2023   TSH 0.58 02/19/2023   HGBA1C 6.0 02/19/2023    US  RENAL Result Date: 03/04/2023 CLINICAL DATA:  Follow-up cyst EXAM: RENAL /  URINARY TRACT ULTRASOUND COMPLETE COMPARISON:  Renal ultrasound 07/19/2019 FINDINGS: Right Kidney: Renal measurements: 9.6 x 3.9 x 4.6 cm = volume: 91 mL. Increased cortical echogenicity. No mass or hydronephrosis visualized. Left Kidney: Renal measurements: 10.5 x 5.4 x 5.0 cm = volume: 146 mL. Increased cortical echogenicity. There is a 4.6 x 4.5 x 3.8 cm exophytic cyst lower pole with thin internal septation, previously 4.5 x 3.9 x 5.1 cm. Bladder: Appears normal for degree of bladder distention. Other: Incidentally identified 6 cm hepatic cyst. IMPRESSION: 1. No hydronephrosis. 2. Increased cortical echogenicity as can be seen with chronic medical renal disease. 3. Left renal cyst with thin internal septation, similar to prior. Electronically Signed   By: Jone Neither M.D.   On: 03/04/2023 12:22    Assessment & Plan:   Problem List Items Addressed This Visit     Dyslipidemia    On Crestor       Essential hypertension   On NAS diet Furosemide  2/wk, Kdur      Relevant Orders   Comprehensive metabolic panel with GFR   UTI   Using aple cider vinegar capsules for prevention      Edema    Compression socks or sleeves Cont on Lasix , Kdur prn      Recurrent UTI - Primary   Using aple cider vinegar capsules for prevention      Prediabetes   On diet      Relevant Orders   Hemoglobin A1c      No orders of the defined types were placed in this encounter.     Follow-up: Return in about 6 months (around 02/22/2024) for a follow-up visit.  Anitra Barn, MD

## 2023-08-22 NOTE — Assessment & Plan Note (Addendum)
 On Crestor

## 2023-08-22 NOTE — Assessment & Plan Note (Signed)
Compression socks or sleeves ?Cont on Lasix, Kdur prn ?

## 2023-08-22 NOTE — Assessment & Plan Note (Signed)
On NAS diet Furosemide 2/wk, Kdur

## 2023-08-22 NOTE — Assessment & Plan Note (Signed)
  On diet  

## 2023-08-25 ENCOUNTER — Ambulatory Visit: Payer: Self-pay | Admitting: Internal Medicine

## 2023-10-03 DIAGNOSIS — R8271 Bacteriuria: Secondary | ICD-10-CM | POA: Diagnosis not present

## 2023-10-03 DIAGNOSIS — N3020 Other chronic cystitis without hematuria: Secondary | ICD-10-CM | POA: Diagnosis not present

## 2023-10-29 DIAGNOSIS — H6122 Impacted cerumen, left ear: Secondary | ICD-10-CM | POA: Diagnosis not present

## 2024-01-06 DIAGNOSIS — L720 Epidermal cyst: Secondary | ICD-10-CM | POA: Diagnosis not present

## 2024-01-06 DIAGNOSIS — L570 Actinic keratosis: Secondary | ICD-10-CM | POA: Diagnosis not present

## 2024-01-06 DIAGNOSIS — Z85828 Personal history of other malignant neoplasm of skin: Secondary | ICD-10-CM | POA: Diagnosis not present

## 2024-01-06 DIAGNOSIS — L821 Other seborrheic keratosis: Secondary | ICD-10-CM | POA: Diagnosis not present

## 2024-01-06 DIAGNOSIS — L814 Other melanin hyperpigmentation: Secondary | ICD-10-CM | POA: Diagnosis not present

## 2024-01-06 DIAGNOSIS — L820 Inflamed seborrheic keratosis: Secondary | ICD-10-CM | POA: Diagnosis not present

## 2024-01-06 DIAGNOSIS — D485 Neoplasm of uncertain behavior of skin: Secondary | ICD-10-CM | POA: Diagnosis not present

## 2024-01-06 DIAGNOSIS — C44629 Squamous cell carcinoma of skin of left upper limb, including shoulder: Secondary | ICD-10-CM | POA: Diagnosis not present

## 2024-01-06 DIAGNOSIS — D1801 Hemangioma of skin and subcutaneous tissue: Secondary | ICD-10-CM | POA: Diagnosis not present

## 2024-02-23 ENCOUNTER — Encounter: Payer: Self-pay | Admitting: Internal Medicine

## 2024-02-23 ENCOUNTER — Ambulatory Visit

## 2024-02-23 ENCOUNTER — Ambulatory Visit: Admitting: Internal Medicine

## 2024-02-23 VITALS — BP 126/88 | HR 74 | Temp 97.8°F | Ht 59.0 in | Wt 162.6 lb

## 2024-02-23 VITALS — BP 126/88 | HR 74 | Ht 59.0 in | Wt 162.0 lb

## 2024-02-23 DIAGNOSIS — I10 Essential (primary) hypertension: Secondary | ICD-10-CM | POA: Diagnosis not present

## 2024-02-23 DIAGNOSIS — E785 Hyperlipidemia, unspecified: Secondary | ICD-10-CM

## 2024-02-23 DIAGNOSIS — Z0000 Encounter for general adult medical examination without abnormal findings: Secondary | ICD-10-CM

## 2024-02-23 DIAGNOSIS — R600 Localized edema: Secondary | ICD-10-CM

## 2024-02-23 DIAGNOSIS — F439 Reaction to severe stress, unspecified: Secondary | ICD-10-CM

## 2024-02-23 DIAGNOSIS — I2510 Atherosclerotic heart disease of native coronary artery without angina pectoris: Secondary | ICD-10-CM

## 2024-02-23 LAB — COMPREHENSIVE METABOLIC PANEL WITH GFR
ALT: 14 U/L (ref 0–35)
AST: 18 U/L (ref 0–37)
Albumin: 4.1 g/dL (ref 3.5–5.2)
Alkaline Phosphatase: 85 U/L (ref 39–117)
BUN: 20 mg/dL (ref 6–23)
CO2: 31 meq/L (ref 19–32)
Calcium: 9.5 mg/dL (ref 8.4–10.5)
Chloride: 102 meq/L (ref 96–112)
Creatinine, Ser: 0.74 mg/dL (ref 0.40–1.20)
GFR: 70.94 mL/min (ref >60.00)
Glucose, Bld: 117 mg/dL — ABNORMAL HIGH (ref 70–99)
Potassium: 4.7 meq/L (ref 3.5–5.1)
Sodium: 142 meq/L (ref 135–145)
Total Bilirubin: 0.4 mg/dL (ref 0.2–1.2)
Total Protein: 7.4 g/dL (ref 6.0–8.3)

## 2024-02-23 LAB — CBC WITH DIFFERENTIAL/PLATELET
Basophils Absolute: 0 K/uL (ref 0.0–0.1)
Basophils Relative: 0.6 % (ref 0.0–3.0)
Eosinophils Absolute: 0.2 K/uL (ref 0.0–0.7)
Eosinophils Relative: 4 % (ref 0.0–5.0)
HCT: 37.3 % (ref 36.0–46.0)
Hemoglobin: 12.2 g/dL (ref 12.0–15.0)
Lymphocytes Relative: 30.9 % (ref 12.0–46.0)
Lymphs Abs: 1.5 K/uL (ref 0.7–4.0)
MCHC: 32.6 g/dL (ref 30.0–36.0)
MCV: 87 fl (ref 78.0–100.0)
Monocytes Absolute: 0.4 K/uL (ref 0.1–1.0)
Monocytes Relative: 7.4 % (ref 3.0–12.0)
Neutro Abs: 2.7 K/uL (ref 1.4–7.7)
Neutrophils Relative %: 57.1 % (ref 43.0–77.0)
Platelets: 167 K/uL (ref 150.0–400.0)
RBC: 4.29 Mil/uL (ref 3.87–5.11)
RDW: 15.5 % (ref 11.5–15.5)
WBC: 4.7 K/uL (ref 4.0–10.5)

## 2024-02-23 LAB — LIPID PANEL
Cholesterol: 196 mg/dL (ref 0–200)
HDL: 49.3 mg/dL (ref >39.00)
LDL Cholesterol: 135 mg/dL — ABNORMAL HIGH (ref 0–99)
NonHDL: 146.94
Total CHOL/HDL Ratio: 4
Triglycerides: 62 mg/dL (ref 0.0–149.0)
VLDL: 12.4 mg/dL (ref 0.0–40.0)

## 2024-02-23 LAB — URINALYSIS
Bilirubin Urine: NEGATIVE
Hgb urine dipstick: NEGATIVE
Ketones, ur: NEGATIVE
Leukocytes,Ua: NEGATIVE
Nitrite: NEGATIVE
Specific Gravity, Urine: 1.02 (ref 1.000–1.030)
Total Protein, Urine: NEGATIVE
Urine Glucose: NEGATIVE
Urobilinogen, UA: 0.2 (ref 0.0–1.0)
pH: 5.5 (ref 5.0–8.0)

## 2024-02-23 LAB — T4, FREE: Free T4: 0.82 ng/dL (ref 0.60–1.60)

## 2024-02-23 LAB — TSH: TSH: 0.43 u[IU]/mL (ref 0.35–5.50)

## 2024-02-23 MED ORDER — METOLAZONE 2.5 MG PO TABS: 2.5000 mg | ORAL_TABLET | Freq: Every day | ORAL | 3 refills | Status: AC

## 2024-02-23 MED ORDER — POTASSIUM CHLORIDE CRYS ER 10 MEQ PO TBCR: EXTENDED_RELEASE_TABLET | ORAL | 3 refills | Status: AC

## 2024-02-23 MED ORDER — FUROSEMIDE 20 MG PO TABS: ORAL_TABLET | ORAL | 3 refills | Status: AC

## 2024-02-23 MED ORDER — FLUTICASONE PROPIONATE 50 MCG/ACT NA SUSP: NASAL | 3 refills | Status: AC

## 2024-02-23 NOTE — Assessment & Plan Note (Addendum)
 C/o stress 2 twin gd's 89 yo w/breast cancer Son was sick Discussed

## 2024-02-23 NOTE — Patient Instructions (Signed)
 [name],  Thank you for taking the time for your Medicare Wellness Visit. I appreciate your continued commitment to your health goals. Please review the care plan we discussed, and feel free to reach out if I can assist you further.  Please note that Annual Wellness Visits do not include a physical exam. Some assessments may be limited, especially if the visit was conducted virtually. If needed, we may recommend an in-person follow-up with your provider.  Ongoing Care Seeing your primary care provider every 3 to 6 months helps us  monitor your health and provide consistent, personalized care.   Referrals If a referral was made during today's visit and you haven't received any updates within two weeks, please contact the referred provider directly to check on the status.  Recommended Screenings:  Health Maintenance  Topic Date Due   Flu Shot  11/07/2023   COVID-19 Vaccine (4 - 2025-26 season) 12/08/2023   Medicare Annual Wellness Visit  02/22/2025   DTaP/Tdap/Td vaccine (4 - Td or Tdap) 08/24/2025   Pneumococcal Vaccine for age over 62  Completed   DEXA scan (bone density measurement)  Completed   Meningitis B Vaccine  Aged Out   Zoster (Shingles) Vaccine  Discontinued       09/04/2022   11:58 AM  Advanced Directives  Does Patient Have a Medical Advance Directive? Yes  Type of Estate Agent of [place];Living will  Copy of Healthcare Power of Attorney in Chart? No - copy requested    Vision: Annual vision screenings are recommended for early detection of glaucoma, cataracts, and diabetic retinopathy. These exams can also reveal signs of chronic conditions such as diabetes and high blood pressure.  Dental: Annual dental screenings help detect early signs of oral cancer, gum disease, and other conditions linked to overall health, including heart disease and diabetes.

## 2024-02-23 NOTE — Progress Notes (Signed)
 Subjective:  Patient ID: Diana Stout, female    DOB: 09/29/1930  Age: 89 y.o. MRN: [id number]  CC: No chief complaint on file.   HPI WALLY BEHAN presents for LLE w swelling L>R, worse Was taking Furosemide  every other day - now qd C/o stress 2 twin gd's 89 yo w/breast cancer No CP, no DOE  Outpatient Medications Prior to Visit  Medication Sig Dispense Refill   albuterol  (VENTOLIN  HFA) 108 (90 Base) MCG/ACT inhaler Inhale 1 puff into the lungs every 6 (six) hours as needed. 8 g 3   Apple Cider Vinegar (APPLE CIDER VINEGAR ULTRA) 600 MG CAPS Take by mouth.     aspirin 81 MG tablet Take 81 mg by mouth daily. (Patient not taking: Reported on 08/22/2023)     azithromycin  (ZITHROMAX ) 250 MG tablet Sig as indicated (Patient not taking: Reported on 02/23/2024) 6 tablet 0   benzonatate  (TESSALON ) 200 MG capsule Take 1 capsule (200 mg total) by mouth 2 (two) times daily as needed for cough. (Patient not taking: Reported on 02/23/2024) 20 capsule 0   cholecalciferol (VITAMIN D ) 1000 UNITS tablet Take 1,000 Units by mouth daily.     cyanocobalamin  1000 MCG tablet Take 1,000 mcg by mouth daily.     dextromethorphan-guaiFENesin  (MUCINEX  DM) 30-600 MG 12hr tablet Take 1 tablet by mouth 2 (two) times daily. (Patient not taking: Reported on 02/23/2024) 20 tablet 1   lidocaine (XYLOCAINE) 2 % solution Take by mouth. (Patient not taking: Reported on 02/23/2024)     Magnesium 400 MG CAPS Take by mouth daily. (Patient not taking: Reported on 02/23/2024)     nitrofurantoin , macrocrystal-monohydrate, (MACROBID ) 100 MG capsule Take 1 capsule (100 mg total) by mouth 2 (two) times daily. (Patient not taking: Reported on 02/23/2024) 14 capsule 0   nitroGLYCERIN  (NITROSTAT ) 0.4 MG SL tablet Place 1 tablet (0.4 mg total) under the tongue every 5 (five) minutes as needed for chest pain. 25 tablet 3   Probiotic Product (ALIGN) 4 MG CAPS Take 1 capsule (4 mg total) by mouth daily. 30 capsule 0   Respiratory  Therapy Supplies (FLUTTER) DEVI Use as directed (Patient not taking: Reported on 02/23/2024) 1 each 0   fluticasone  (FLONASE ) 50 MCG/ACT nasal spray USE 2 SPRAYS IN EACH NOSTRIL AS NEEDED FOR RHINITIS (Patient not taking: Reported on 02/23/2024) 48 g 3   furosemide  (LASIX ) 20 MG tablet TAKE 1 TABLET(20 MG) BY MOUTH DAILY AS NEEDED 90 tablet 1   potassium chloride  (KLOR-CON  M) 10 MEQ tablet TAKE ONCE A DAY WITH FUROSEMIDE  AS NEEDED 90 tablet 3   No facility-administered medications prior to visit.    ROS: Review of Systems  Constitutional:  Negative for activity change, appetite change, chills, fatigue and unexpected weight change.  HENT:  Negative for congestion, mouth sores and sinus pressure.   Eyes:  Negative for visual disturbance.  Respiratory:  Negative for cough and chest tightness.   Cardiovascular:  Positive for leg swelling.  Gastrointestinal:  Negative for abdominal pain and nausea.  Genitourinary:  Negative for difficulty urinating, frequency and vaginal pain.  Musculoskeletal:  Negative for back pain and gait problem.  Skin:  Negative for pallor and rash.  Neurological:  Negative for dizziness, tremors, weakness, numbness and headaches.  Psychiatric/Behavioral:  Negative for confusion and sleep disturbance.     Objective:  BP 126/88   Pulse 74   Temp 97.8 F (36.6 C)   Ht 4' 11 (1.499 m)   Wt 162 lb 9.6 oz (  73.8 kg)   LMP 04/08/1996   SpO2 99%   BMI 32.84 kg/m   BP Readings from Last 3 Encounters:  02/23/24 126/88  02/23/24 126/88  08/22/23 118/60    Wt Readings from Last 3 Encounters:  02/23/24 162 lb (73.5 kg)  02/23/24 162 lb 9.6 oz (73.8 kg)  08/22/23 166 lb 9.6 oz (75.6 kg)    Physical Exam Constitutional:      General: She is not in acute distress.    Appearance: She is well-developed. She is obese.  HENT:     Head: Normocephalic.     Right Ear: External ear normal.     Left Ear: External ear normal.     Nose: Nose normal.  Eyes:      General:        Right eye: No discharge.        Left eye: No discharge.     Conjunctiva/sclera: Conjunctivae normal.     Pupils: Pupils are equal, round, and reactive to light.  Neck:     Thyroid : No thyromegaly.     Vascular: No JVD.     Trachea: No tracheal deviation.  Cardiovascular:     Rate and Rhythm: Normal rate and regular rhythm.     Heart sounds: Normal heart sounds.  Pulmonary:     Effort: No respiratory distress.     Breath sounds: No stridor. No wheezing.  Abdominal:     General: Bowel sounds are normal. There is no distension.     Palpations: Abdomen is soft. There is no mass.     Tenderness: There is no abdominal tenderness. There is no guarding or rebound.  Musculoskeletal:        General: No tenderness.     Cervical back: Normal range of motion and neck supple. No rigidity.     Right lower leg: Edema present.     Left lower leg: Edema present.  Lymphadenopathy:     Cervical: No cervical adenopathy.  Skin:    Findings: No erythema or rash.  Neurological:     Cranial Nerves: No cranial nerve deficit.     Motor: No abnormal muscle tone.     Coordination: Coordination normal.     Deep Tendon Reflexes: Reflexes normal.  Psychiatric:        Behavior: Behavior normal.        Thought Content: Thought content normal.        Judgment: Judgment normal.    Edema L>R L ankle foot 2-3+, R 1+  Lab Results  Component Value Date   WBC 4.3 02/19/2023   HGB 12.4 02/19/2023   HCT 38.6 02/19/2023   PLT 190.0 02/19/2023   GLUCOSE 111 (H) 08/22/2023   CHOL 205 (H) 12/19/2021   TRIG 122.0 12/19/2021   HDL 43.00 12/19/2021   LDLDIRECT 154.9 05/01/2011   LDLCALC 137 (H) 12/19/2021   ALT 14 08/22/2023   AST 20 08/22/2023   NA 141 08/22/2023   K 4.7 08/22/2023   CL 102 08/22/2023   CREATININE 0.78 08/22/2023   BUN 21 08/22/2023   CO2 31 08/22/2023   TSH 0.58 02/19/2023   HGBA1C 6.2 08/22/2023    US  RENAL Result Date: 03/04/2023 CLINICAL DATA:  Follow-up cyst  EXAM: RENAL / URINARY TRACT ULTRASOUND COMPLETE COMPARISON:  Renal ultrasound 07/19/2019 FINDINGS: Right Kidney: Renal measurements: 9.6 x 3.9 x 4.6 cm = volume: 91 mL. Increased cortical echogenicity. No mass or hydronephrosis visualized. Left Kidney: Renal measurements: 10.5 x 5.4 x 5.0 cm =  volume: 146 mL. Increased cortical echogenicity. There is a 4.6 x 4.5 x 3.8 cm exophytic cyst lower pole with thin internal septation, previously 4.5 x 3.9 x 5.1 cm. Bladder: Appears normal for degree of bladder distention. Other: Incidentally identified 6 cm hepatic cyst. IMPRESSION: 1. No hydronephrosis. 2. Increased cortical echogenicity as can be seen with chronic medical renal disease. 3. Left renal cyst with thin internal septation, similar to prior. Electronically Signed   By: Bard Moats M.D.   On: 03/04/2023 12:22    Assessment & Plan:   Problem List Items Addressed This Visit     CAD (coronary artery disease)   Relevant Medications   metolazone (ZAROXOLYN) 2.5 MG tablet   furosemide  (LASIX ) 20 MG tablet   Other Relevant Orders   TSH   T4, free   Lipid panel   Comprehensive metabolic panel with GFR   CBC with Differential/Platelet   Urinalysis   Dyslipidemia   Relevant Orders   TSH   T4, free   Lipid panel   Comprehensive metabolic panel with GFR   CBC with Differential/Platelet   Urinalysis   Edema - Primary    LLE w swelling L>R, worse Was taking Furosemide  every other day - now every day. Added Zaroxolyn Vascuar surgery consult - ven insufficiency +/- lymphedema      Relevant Orders   TSH   T4, free   Lipid panel   Comprehensive metabolic panel with GFR   CBC with Differential/Platelet   Urinalysis   Ambulatory referral to Vascular Surgery   Essential hypertension     Added Zaroxolyn       Relevant Medications   metolazone (ZAROXOLYN) 2.5 MG tablet   furosemide  (LASIX ) 20 MG tablet   Stress at home   C/o stress 2 twin gd's 89 yo w/breast cancer Son was  sick Discussed         Meds ordered this encounter  Medications   metolazone (ZAROXOLYN) 2.5 MG tablet    Sig: Take 1 tablet (2.5 mg total) by mouth daily.    Dispense:  90 tablet    Refill:  3   fluticasone  (FLONASE ) 50 MCG/ACT nasal spray    Sig: USE 2 SPRAYS IN EACH NOSTRIL AS NEEDED FOR RHINITIS    Dispense:  48 g    Refill:  3   furosemide  (LASIX ) 20 MG tablet    Sig: 1 po q am    Dispense:  90 tablet    Refill:  3   potassium chloride  (KLOR-CON  M) 10 MEQ tablet    Sig: TAKE ONCE A DAY WITH FUROSEMIDE     Dispense:  90 tablet    Refill:  3      Follow-up: No follow-ups on file.  Marolyn Noel, MD

## 2024-02-23 NOTE — Assessment & Plan Note (Signed)
   Added Zaroxolyn

## 2024-02-23 NOTE — Assessment & Plan Note (Addendum)
 LLE w swelling L>R, worse Was taking Furosemide  every other day - now every day. Added Zaroxolyn Vascuar surgery consult - ven insufficiency +/- lymphedema

## 2024-02-23 NOTE — Progress Notes (Addendum)
 Chief Complaint  Patient presents with   Medicare Wellness     Subjective:   Diana Stout is a 89 y.o. female who presents for a Medicare Annual Wellness Visit.  Allergies (verified) Atorvastatin, Alendronate sodium, Ceftriaxone sodium, Codeine phosphate, Moxifloxacin, and Simvastatin    History: Past Medical History:  Diagnosis Date   Abdominal pain, lower 11/22/2010   2015 resolved off Zocor  5/17?urticaria w/GI upset: Dtr is allergic to seafood - the pt is on MegaRed krill oil - d/c MegaRed    Abnormal chest CT 05/22/2011   Followed in Pulmonary clinic/ [facility]/ [facility]  05/14/11 CT CHEST WITHOUT CONTRAST 1. Multiple small pulmonary nodules scattered throughout the lungs  bilaterally ranging in size from 3-6 mm.    2. There is a small hiatal hernia.  3. Atherosclerosis.  4. Status post cholecystectomy.  5. Low attenuation hepatic lesions unchanged compared to recent  abdominal CT scan, likely to represent cysts   ANEMIA, NORMOCYTIC 08/10/2009   mild     DIVERTICULOSIS, COLON 03/08/2007   Qualifier: Diagnosis of  By: Norleen MD, Lynwood ORN    Dizziness 07/19/2013   Likely due to diarrhea/meds 4/15    DVT (deep venous thrombosis) (HCC) 1998   hx of right   Dyslipidemia 02/02/2007   Chronic 1/13 worse     Dyspnea 07/01/2008   2/13 improved w/reg exercise 1/17 deconditioning - CL stress test was nl     Edema 09/27/2009   Qualifier: Diagnosis of  By: Delford, MD, CODY Maude Dunnings    ESOPHAGEAL STRICTURE 03/06/2007   Qualifier: Diagnosis of  By: Norleen MD, Lynwood ORN    Esophagitis 2006   Essential hypertension 03/08/2007   Chronic  On Benicar     FOOT PAIN 07/25/2009   R foot OA     GERD 02/02/2007   Wedge pillow Protonix  prn, Pepcid  - d/c   History of pneumonia 12/2016   History of shingles    Hyperlipidemia    IBS 03/06/2007   Qualifier: Diagnosis of  By: Norleen MD, Lynwood ORN    Impaired glucose tolerance 11/22/2010   Kidney cysts    left- Dr Mardy   Lung nodule 05/08/2011    04/15/11 RLL 4 mm nodule on abd CT - [facility] office    Obesity 01/03/2015   Osteoporosis 02/02/2007   Chronic  Vit D, yoga   Palpitations 05/12/2014   Dr Delford 2/16 poss due to steroid pack    Rash and nonspecific skin eruption 09/12/2015   5/17?urticaria Pt is allergic to seafood - the pt is on MegaRed krill oil - d/c MegaRed    Upper airway cough syndrome 12/08/2014   Dr Darlean Flutter valve added 01/02/2015 > resolved 02/03/2015  - rec try off gerd rx p 03/09/15    Past Surgical History:  Procedure Laterality Date   APPENDECTOMY     CHOLECYSTECTOMY     HYSTEROSCOPY WITH RESECTOSCOPE  7/99   resect polyp   MOHS SURGERY  05/2018   face   TUBAL LIGATION Bilateral    Family History  Problem Relation Age of Onset   Asthma Mother    Diabetes Father    Cancer Father        liver   Diabetes Sister    Asthma Sister    Breast cancer Sister    Asthma Sister    Diabetes Brother    Asthma Brother    Diabetes Brother    Thyroid  disease Maternal Uncle    Coronary artery disease Other  1 st degree female relative   Social History   Occupational History   Occupation: Retired    Associate Professor: RETIRED  Tobacco Use   Smoking status: Never   Smokeless tobacco: Never  Vaping Use   Vaping status: Never Used  Substance and Sexual Activity   Alcohol use: No   Drug use: No   Sexual activity: Not Currently    Birth control/protection: Post-menopausal   Tobacco Counseling Counseling given: Not Answered  SDOH Screenings   Food Insecurity: No Food Insecurity (02/23/2024)  Housing: Unknown (02/23/2024)  Transportation Needs: No Transportation Needs (02/23/2024)  Utilities: Not At Risk (02/23/2024)  Alcohol Screen: Low Risk  (09/04/2022)  Depression (PHQ2-9): Low Risk  (02/23/2024)  Financial Resource Strain: Low Risk  (09/04/2022)  Physical Activity: Insufficiently Active (02/23/2024)  Social Connections: Moderately Integrated (02/23/2024)  Stress: No Stress Concern Present (02/23/2024)   Tobacco Use: Low Risk  (02/23/2024)  Health Literacy: Adequate Health Literacy (02/23/2024)   See flowsheets for full screening details  Depression Screen PHQ 2 & 9 Depression Scale- Over the past 2 weeks, how often have you been bothered by any of the following problems? Little interest or pleasure in doing things: 0 Feeling down, depressed, or hopeless (PHQ Adolescent also includes...irritable): 0 PHQ-2 Total Score: 0 Trouble falling or staying asleep, or sleeping too much: 0 Feeling tired or having little energy: 0 Poor appetite or overeating (PHQ Adolescent also includes...weight loss): 0 Feeling bad about yourself - or that you are a failure or have let yourself or your family down: 0 Trouble concentrating on things, such as reading the newspaper or watching television (PHQ Adolescent also includes...like school work): 0 Moving or speaking so slowly that other people could have noticed. Or the opposite - being so fidgety or restless that you have been moving around a lot more than usual: 0 Thoughts that you would be better off dead, or of hurting yourself in some way: 0 PHQ-9 Total Score: 0 If you checked off any problems, how difficult have these problems made it for you to do your work, take care of things at home, or get along with other people?: Not difficult at all  Depression Treatment Depression Interventions/Treatment : EYV7-0 Score <4 Follow-up Not Indicated     Goals Addressed               This Visit's Progress     Patient Stated (pt-stated)        Patient stated she plans to continue eating healthy & walking - want to lose weight due to retaining fluid       Visit info / Clinical Intake: Medicare Wellness Visit Type:: Subsequent Annual Wellness Visit Persons participating in visit:: patient Medicare Wellness Visit Mode:: In-person (required for WTM) Information given by:: patient Interpreter Needed?: No Pre-visit prep was completed: yes AWV  questionnaire completed by patient prior to visit?: no Living arrangements:: (!) lives alone Patient's Overall Health Status Rating: good Typical amount of pain: none Does pain affect daily life?: no Are you currently prescribed opioids?: no  Dietary Habits and Nutritional Risks How many meals a day?: 3 Eats fruit and vegetables daily?: yes Most meals are obtained by: preparing own meals; eating out In the last 2 weeks, have you had any of the following?: none Diabetic:: no  Functional Status Activities of Daily Living (to include ambulation/medication): Independent Ambulation: Independent with device- listed below Home Assistive Devices/Equipment: Eyeglasses; [place] (wears hearing aids) Medication Administration: Independent Home Management: Independent Manage your own finances?:  yes Primary transportation is: driving Concerns about vision?: no *vision screening is required for WTM* Concerns about hearing?: (!) yes Uses hearing aids?: (!) yes Hear whispered voice?: (!) no *in-person visit only*  Fall Screening Falls in the past year?: 0 Number of falls in past year: 0 Was there an injury with Fall?: 0 Fall Risk Category Calculator: 0 Patient Fall Risk Level: Low Fall Risk  Fall Risk Patient at Risk for Falls Due to: No Fall Risks Fall risk Follow up: Falls evaluation completed; Falls prevention discussed  Home and Transportation Safety: All rugs have non-skid backing?: yes All stairs or steps have railings?: N/A, no stairs Grab bars in the bathtub or shower?: yes Have non-skid surface in bathtub or shower?: yes Good home lighting?: yes Regular seat belt use?: yes Hospital stays in the last year:: no  Cognitive Assessment Difficulty concentrating, remembering, or making decisions? : no Will 6CIT or Mini Cog be Completed: yes What year is it?: 0 points What month is it?: 0 points Give patient an address phrase to remember (5 components): [address] [place] About what time is it?: 0 points Count backwards from 20 to 1: 0 points Say the months of the year in reverse: 0 points Repeat the address phrase from earlier: 0 points 6 CIT Score: 0 points  Advance Directives (For Healthcare) Does Patient Have a Medical Advance Directive?: Yes Does patient want to make changes to medical advance directive?: Yes (Inpatient - patient requests chaplain consult to change a medical advance directive) Type of Advance Directive: Healthcare Power of [place]; Living will Copy of Healthcare Power of Attorney in Chart?: No - copy requested Copy of Living Will in Chart?: No - copy requested  Reviewed/Updated  Reviewed/Updated: Reviewed All (Medical, Surgical, Family, Medications, Allergies, Care Teams, Patient Goals)        Objective:    Today's Vitals   02/23/24 1052  BP: 126/88  Pulse: 74  SpO2: 99%  Weight: 162 lb (73.5 kg)  Height: 4' 11 (1.499 m)   Body mass index is 32.72 kg/m.  Current Medications (verified) Outpatient Encounter Medications as of 02/23/2024  Medication Sig   albuterol  (VENTOLIN  HFA) 108 (90 Base) MCG/ACT inhaler Inhale 1 puff into the lungs every 6 (six) hours as needed.   Apple Cider Vinegar (APPLE CIDER VINEGAR ULTRA) 600 MG CAPS Take by mouth.   aspirin 81 MG tablet Take 81 mg by mouth daily.   cholecalciferol (VITAMIN D ) 1000 UNITS tablet Take 1,000 Units by mouth daily.   cyanocobalamin  1000 MCG tablet Take 1,000 mcg by mouth daily.   fluticasone  (FLONASE ) 50 MCG/ACT nasal spray USE 2 SPRAYS IN EACH NOSTRIL AS NEEDED FOR RHINITIS   furosemide  (LASIX ) 20 MG tablet 1 po q am   metolazone  (ZAROXOLYN ) 2.5 MG tablet Take 1 tablet (2.5 mg total) by mouth daily.   nitroGLYCERIN  (NITROSTAT ) 0.4 MG SL tablet Place 1 tablet (0.4 mg total) under the tongue every 5 (five) minutes as needed for chest pain.   potassium chloride  (KLOR-CON  M) 10 MEQ tablet TAKE ONCE A DAY WITH FUROSEMIDE    Probiotic Product (ALIGN) 4 MG CAPS Take  1 capsule (4 mg total) by mouth daily.   [DISCONTINUED] furosemide  (LASIX ) 20 MG tablet TAKE 1 TABLET(20 MG) BY MOUTH DAILY AS NEEDED   [DISCONTINUED] potassium chloride  (KLOR-CON  M) 10 MEQ tablet TAKE ONCE A DAY WITH FUROSEMIDE  AS NEEDED   azithromycin  (ZITHROMAX ) 250 MG tablet Sig as indicated (Patient not taking: Reported on 02/23/2024)  benzonatate  (TESSALON ) 200 MG capsule Take 1 capsule (200 mg total) by mouth 2 (two) times daily as needed for cough. (Patient not taking: Reported on 02/23/2024)   dextromethorphan-guaiFENesin  (MUCINEX  DM) 30-600 MG 12hr tablet Take 1 tablet by mouth 2 (two) times daily. (Patient not taking: Reported on 02/23/2024)   lidocaine (XYLOCAINE) 2 % solution Take by mouth. (Patient not taking: Reported on 02/23/2024)   Magnesium 400 MG CAPS Take by mouth daily. (Patient not taking: Reported on 02/23/2024)   nitrofurantoin , macrocrystal-monohydrate, (MACROBID ) 100 MG capsule Take 1 capsule (100 mg total) by mouth 2 (two) times daily. (Patient not taking: Reported on 02/23/2024)   Respiratory Therapy Supplies (FLUTTER) DEVI Use as directed (Patient not taking: Reported on 02/23/2024)   [DISCONTINUED] fluticasone  (FLONASE ) 50 MCG/ACT nasal spray USE 2 SPRAYS IN EACH NOSTRIL AS NEEDED FOR RHINITIS (Patient not taking: Reported on 02/23/2024)   No facility-administered encounter medications on file as of 02/23/2024.   Hearing/Vision screen Hearing Screening - Comments:: Wears hearing aids Vision Screening - Comments:: Wears eyeglasses for reading - up to date with routine eye exams with [facility]  Immunizations and Health Maintenance Health Maintenance  Topic Date Due   COVID-19 Vaccine (4 - 2025-26 season) 12/08/2023   Medicare Annual Wellness (AWV)  02/22/2025   DTaP/Tdap/Td (4 - Td or Tdap) 08/24/2025   Pneumococcal Vaccine: 50+ Years  Completed   Influenza Vaccine  Completed   DEXA SCAN  Completed   Meningococcal B Vaccine  Aged Out   Zoster Vaccines-  Shingrix  Discontinued        Assessment/Plan:  This is a routine wellness examination for [place].  Patient Care Team: Plotnikov, Karlynn GAILS, MD as PCP - General Delford Maude BROCKS, MD as PCP - Cardiology (Cardiology) Cindie Ole DASEN, MD as PCP - Electrophysiology (Cardiology) Jerel Reena Curio (Gynecology) Tess Agent, MD (Orthopedic Surgery) Cary Doffing, MD (Dermatology) Darlean Ozell NOVAK, MD as Consulting Physician (Pulmonary Disease) Darlean Ozell NOVAK, MD as Consulting Physician (Pulmonary Disease) Shari Sieving, MD as Consulting Physician (Orthopedic Surgery) [facility] as Consulting Physician (Ophthalmology)  I have personally reviewed and noted the following in the patient's chart:   Medical and social history Use of alcohol, tobacco or illicit drugs  Current medications and supplements including opioid prescriptions. Functional ability and status Nutritional status Physical activity Advanced directives List of other physicians Hospitalizations, surgeries, and ER visits in previous 12 months Vitals Screenings to include cognitive, depression, and falls Referrals and appointments  No orders of the defined types were placed in this encounter.  In addition, I have reviewed and discussed with patient certain preventive protocols, quality metrics, and best practice recommendations. A written personalized care plan for preventive services as well as general preventive health recommendations were provided to patient.   Verdie CHRISTELLA Saba, CMA   02/23/2024   Return in 1 year (on 02/22/2025).  After Visit Summary: (In Person-Declined) Patient declined AVS at this time.  Nurse Notes: Scheduled 2026 AWV/CPE appts.  Medical screening examination/treatment/procedure(s) were performed by non-physician practitioner and as supervising physician I was immediately available for consultation/collaboration.  I agree with above. Karlynn Noel, MD

## 2024-02-26 ENCOUNTER — Telehealth: Payer: Self-pay

## 2024-02-26 ENCOUNTER — Other Ambulatory Visit (HOSPITAL_COMMUNITY): Payer: Self-pay

## 2024-02-26 NOTE — Telephone Encounter (Signed)
 Pharmacy Patient Advocate Encounter   Received notification from Onbase that prior authorization for Fluticasone  50 nasal spray required/requested.   Insurance verification completed.   The patient is insured through [facility].   Per test claim: Refill too soon. PA is not needed at this time. Medication was filled 02/23/24. Next eligible fill date is 05/01/24..   Cumulative Early Refill: Claim history shows member has enough supply on hand for 87 days. Next fill date: 05/01/24.

## 2024-03-01 ENCOUNTER — Ambulatory Visit: Payer: Self-pay | Admitting: Internal Medicine

## 2024-03-29 NOTE — Progress Notes (Signed)
 CARDIOLOGY CONSULT NOTE      Patient ID: Diana Stout MRN: [id number] DOB/AGE: 1931/02/28 89 y.o.  Primary Physician: Garald Karlynn GAILS, MD Primary Cardiologist: Delford   HPI:  89 y.o. referred by Dr Rodman 01/06/19  for chest pain and palpitations. 9/20 had pounding heart with chest tightness for 2.5 hours. BP was elevated some dizziness. Resolved and felt fine. Had been off BP meds for a year Toprol  restarted on 12/29/18 by primary ECG reviewed from 9/22 normal sinus normal ST/T waves rate 86 bpm  Previous w/u with normal myovue March 2017 EF 68% Has had normal echo's in 2010 and 2016 Some bronchiectasis  Intolerant to lipitor and Zocor  in past  She has not had recurrence of palpitations or chest pain She has gotten out of shape during COVID Some exertional dyspnea   Monitor reviewed from 01/28/19  Average HR 80 PAC;s short bursts <8 beats atrial tachycardia  Daughter who had ENT cancer doing well and has masters in deaf education   12/22/20 MRI with lacunar/pontine stroke ? Related to balance issues Seen by Dr Cindie 02/13/21 and no ILR recommended 14 day Zio with no PAF TTE only mild LAE normal EF and no significant valve disease Carotids 01/03/21 with no significant dx  No cardiac issues A/Chronic LE edema on lasix  Weight actually down 4 lbs  Zaroxolyn  added and referred to [facility] for venous insufficiency and lymphedema  Has been intolerant to statins and currently not on Rx for HLD   She has always had a combination of LE varicose venous dx and lymphedema    ROS All other systems reviewed and negative except as noted above  Past Medical History:  Diagnosis Date   Abdominal pain, lower 11/22/2010   2015 resolved off Zocor  5/17?urticaria w/GI upset: Dtr is allergic to seafood - the pt is on MegaRed krill oil - d/c MegaRed    Abnormal chest CT 05/22/2011   Followed in Pulmonary clinic/ [facility]/ [facility]  05/14/11 CT CHEST WITHOUT CONTRAST 1. Multiple small pulmonary  nodules scattered throughout the lungs  bilaterally ranging in size from 3-6 mm.    2. There is a small hiatal hernia.  3. Atherosclerosis.  4. Status post cholecystectomy.  5. Low attenuation hepatic lesions unchanged compared to recent  abdominal CT scan, likely to represent cysts   ANEMIA, NORMOCYTIC 08/10/2009   mild     DIVERTICULOSIS, COLON 03/08/2007   Qualifier: Diagnosis of  By: Norleen MD, Lynwood ORN    Dizziness 07/19/2013   Likely due to diarrhea/meds 4/15    DVT (deep venous thrombosis) (HCC) 1998   hx of right   Dyslipidemia 02/02/2007   Chronic 1/13 worse     Dyspnea 07/01/2008   2/13 improved w/reg exercise 1/17 deconditioning - CL stress test was nl     Edema 09/27/2009   Qualifier: Diagnosis of  By: Delford, MD, CODY Maude Dunnings    ESOPHAGEAL STRICTURE 03/06/2007   Qualifier: Diagnosis of  By: Norleen MD, Lynwood ORN    Esophagitis 2006   Essential hypertension 03/08/2007   Chronic  On Benicar     FOOT PAIN 07/25/2009   R foot OA     GERD 02/02/2007   Wedge pillow Protonix  prn, Pepcid  - d/c   History of pneumonia 12/2016   History of shingles    Hyperlipidemia    IBS 03/06/2007   Qualifier: Diagnosis of  By: Norleen MD, Lynwood ORN    Impaired glucose tolerance 11/22/2010   Kidney cysts  left- Dr Mardy   Lung nodule 05/08/2011   04/15/11 RLL 4 mm nodule on abd CT - [facility] office    Obesity 01/03/2015   Osteoporosis 02/02/2007   Chronic  Vit D, yoga   Palpitations 05/12/2014   Dr Delford 2/16 poss due to steroid pack    Rash and nonspecific skin eruption 09/12/2015   5/17?urticaria Pt is allergic to seafood - the pt is on MegaRed krill oil - d/c MegaRed    Upper airway cough syndrome 12/08/2014   Dr Darlean Flutter valve added 01/02/2015 > resolved 02/03/2015  - rec try off gerd rx p 03/09/15     Family History  Problem Relation Age of Onset   Asthma Mother    Diabetes Father    Cancer Father        liver   Diabetes Sister    Asthma Sister    Breast cancer Sister    Asthma Sister     Diabetes Brother    Asthma Brother    Diabetes Brother    Thyroid  disease Maternal Uncle    Coronary artery disease Other        1 st degree female relative    Social History   Socioeconomic History   Marital status: Widowed    Spouse name: Not on file   Number of children: 3   Years of education: Not on file   Highest education level: Not on file  Occupational History   Occupation: Retired    Associate Professor: RETIRED  Tobacco Use   Smoking status: Never   Smokeless tobacco: Never  Vaping Use   Vaping status: Never Used  Substance and Sexual Activity   Alcohol use: No   Drug use: No   Sexual activity: Not Currently    Birth control/protection: Post-menopausal  Other Topics Concern   Not on file  Social History Narrative   Not on file   Social Drivers of Health   Tobacco Use: Low Risk (02/23/2024)   Patient History    Smoking Tobacco Use: Never    Smokeless Tobacco Use: Never    Passive Exposure: Not on file  Financial Resource Strain: Low Risk (09/04/2022)   Overall Financial Resource Strain (CARDIA)    Difficulty of Paying Living Expenses: Not hard at all  Food Insecurity: No Food Insecurity (02/23/2024)   Epic    Worried About Programme Researcher, Broadcasting/film/video in the Last Year: Never true    Ran Out of Food in the Last Year: Never true  Transportation Needs: No Transportation Needs (02/23/2024)   Epic    Lack of Transportation (Medical): No    Lack of Transportation (Non-Medical): No  Physical Activity: Insufficiently Active (02/23/2024)   Exercise Vital Sign    Days of Exercise per Week: 5 days    Minutes of Exercise per Session: 20 min  Stress: No Stress Concern Present (02/23/2024)   [employer] of Occupational Health - Occupational Stress Questionnaire    Feeling of Stress: Only a little  Social Connections: Moderately Integrated (02/23/2024)   Social Connection and Isolation Panel    Frequency of Communication with Friends and Family: More than three times a week     Frequency of Social Gatherings with Friends and Family: More than three times a week    Attends Religious Services: More than 4 times per year    Active Member of [employer] or Organizations: Yes    Attends Banker Meetings: More than 4 times per year    Marital  Status: Widowed  Intimate Partner Violence: Not At Risk (02/23/2024)   Epic    Fear of Current or Ex-Partner: No    Emotionally Abused: No    Physically Abused: No    Sexually Abused: No  Depression (PHQ2-9): Low Risk (02/23/2024)   Depression (PHQ2-9)    PHQ-2 Score: 0  Alcohol Screen: Low Risk (09/04/2022)   Alcohol Screen    Last Alcohol Screening Score (AUDIT): 1  Housing: Unknown (02/23/2024)   Epic    Unable to Pay for Housing in the Last Year: No    Number of Times Moved in the Last Year: Not on file    Homeless in the Last Year: No  Utilities: Not At Risk (02/23/2024)   Epic    Threatened with loss of utilities: No  Health Literacy: Adequate Health Literacy (02/23/2024)   B1300 Health Literacy    Frequency of need for help with medical instructions: Never    Past Surgical History:  Procedure Laterality Date   APPENDECTOMY     CHOLECYSTECTOMY     HYSTEROSCOPY WITH RESECTOSCOPE  7/99   resect polyp   MOHS SURGERY  05/2018   face   TUBAL LIGATION Bilateral         Physical Exam: Blood pressure 118/72, pulse 94, height 4' 11 (1.499 m), weight 159 lb (72.1 kg), last menstrual period 04/08/1996, SpO2 95%.   Affect appropriate Healthy:  appears stated age HEENT: normal Neck supple with no adenopathy JVP normal no bruits no thyromegaly Lungs clear with no wheezing and good diaphragmatic motion Heart:  S1/S2 no murmur, no rub, gallop or click PMI normal Abdomen: benighn, BS positve, no tenderness, no AAA no bruit.  No HSM or HJR Distal pulses intact with no bruits Plus one bilateral edema Neuro non-focal Skin warm and dry No muscular weakness   Labs:   Lab Results  Component Value  Date   WBC 4.7 02/23/2024   HGB 12.2 02/23/2024   HCT 37.3 02/23/2024   MCV 87.0 02/23/2024   PLT 167.0 02/23/2024    No results for input(s): NA, K, CL, CO2, BUN, CREATININE, CALCIUM , PROT, BILITOT, ALKPHOS, ALT, AST, GLUCOSE in the last 168 hours.  Invalid input(s): LABALBU  Lab Results  Component Value Date   CKTOTAL 272 (H) 08/02/2009   CKMB 1.5 06/22/2014   TROPONINI 0.01        NO INDICATION OF MYOCARDIAL INJURY. 08/02/2009    Lab Results  Component Value Date   CHOL 196 02/23/2024   CHOL 205 (H) 12/19/2021   CHOL 206 (H) 11/29/2020   Lab Results  Component Value Date   HDL 49.30 02/23/2024   HDL 43.00 12/19/2021   HDL 47.00 11/29/2020   Lab Results  Component Value Date   LDLCALC 135 (H) 02/23/2024   LDLCALC 137 (H) 12/19/2021   LDLCALC 139 (H) 11/29/2020   Lab Results  Component Value Date   TRIG 62.0 02/23/2024   TRIG 122.0 12/19/2021   TRIG 102.0 11/29/2020   Lab Results  Component Value Date   CHOLHDL 4 02/23/2024   CHOLHDL 5 12/19/2021   CHOLHDL 4 11/29/2020   Lab Results  Component Value Date   LDLDIRECT 154.9 05/01/2011      Radiology: No results found.  EKG: 12/29/18 NSR normal ECG  01/04/20 SR rate 80 LAD low voltage 04/06/2024 SR rate 91 normal  04/06/2024 SR LAD normal    ASSESSMENT AND PLAN:   1.  Palpitations/Silent Stroke:  Likely self limited episode of PSVT  Continue Toprol  Monitor 03/07/21 no SVT/PAF or serious arrhythmia Recent diagnosis of silent stroke Pattern on MRI seems lacunar / pontine and not embolic  Dr Cindie did not think ILR needed   2. Chest pain:  In setting of palpitations Normal ECG and previously normal myovue x 3 most recent March 2017 no indication for stress testing currently   3. HTN:  Per primary on lasix  for LE edema   4. Pulmonary:  Bronchiectasis no recent pneumonia Flu shot f/u pulmonary   5. Edema:  acute on chronic lasix /zaroxolyn  referred to [facility] for venous  insufficiency and lymphedema K 4.7 and cR 0.74 02/23/24 Should have f/u BMET/BNP with primary   LE venous duplex bilateral   F/U in a year       Signed: Maude Emmer 04/06/2024, 10:01 AM

## 2024-04-06 ENCOUNTER — Ambulatory Visit: Attending: Cardiovascular Disease | Admitting: Cardiovascular Disease

## 2024-04-06 VITALS — BP 118/72 | HR 94 | Ht 59.0 in | Wt 159.0 lb

## 2024-04-06 DIAGNOSIS — R600 Localized edema: Secondary | ICD-10-CM

## 2024-04-06 DIAGNOSIS — I10 Essential (primary) hypertension: Secondary | ICD-10-CM

## 2024-04-06 DIAGNOSIS — R002 Palpitations: Secondary | ICD-10-CM | POA: Diagnosis not present

## 2024-04-06 NOTE — Patient Instructions (Signed)
 Medication Instructions:  No medication changes were made at this visit. Continue current regimen.   *If you need a refill on your cardiac medications before your next appointment, please call your pharmacy*  Lab Work: None ordered today. If you have labs (blood work) drawn today and your tests are completely normal, you will receive your results only by: MyChart Message (if you have MyChart) OR A paper copy in the mail If you have any lab test that is abnormal or we need to change your treatment, we will call you to review the results.  Testing/Procedures: Your physician has requested that you have a lower extremity venous duplex. This test is an ultrasound of the veins in the legs or arms. It looks at venous blood flow that carries blood from the heart to the legs or arms. Allow one hour for a Lower Venous exam. Allow thirty minutes for an Upper Venous exam. There are no restrictions or special instructions.  Please note: We ask at that you not bring children with you during ultrasound (echo/ vascular) testing. Due to room size and safety concerns, children are not allowed in the ultrasound rooms during exams. Our front office staff cannot provide observation of children in our lobby area while testing is being conducted. An adult accompanying a patient to their appointment will only be allowed in the ultrasound room at the discretion of the ultrasound technician under special circumstances. We apologize for any inconvenience.   Follow-Up: At [facility], you and your health needs are our priority.  As part of our continuing mission to provide you with exceptional heart care, our providers are all part of one team.  This team includes your primary Cardiologist (physician) and Advanced Practice Providers or APPs (Physician Assistants and Nurse Practitioners) who all work together to provide you with the care you need, when you need it.  Your next appointment:   1 year(s)  Provider:    Maude Emmer, MD

## 2024-04-07 ENCOUNTER — Ambulatory Visit (HOSPITAL_COMMUNITY)
Admission: RE | Admit: 2024-04-07 | Discharge: 2024-04-07 | Disposition: A | Discharge status: Home / Self Care | Source: Ambulatory Visit | Attending: Cardiovascular Disease | Admitting: Cardiovascular Disease

## 2024-04-07 DIAGNOSIS — R600 Localized edema: Secondary | ICD-10-CM | POA: Insufficient documentation

## 2024-05-25 ENCOUNTER — Ambulatory Visit: Admitting: Internal Medicine

## 2024-07-15 ENCOUNTER — Encounter: Admitting: Vascular Surgery

## 2024-07-15 ENCOUNTER — Ambulatory Visit (HOSPITAL_COMMUNITY)

## 2025-02-23 ENCOUNTER — Ambulatory Visit

## 2025-02-28 ENCOUNTER — Encounter: Admitting: Internal Medicine

## 2025-02-28 ENCOUNTER — Ambulatory Visit
